# Patient Record
Sex: Female | Born: 1950
Health system: Southern US, Community
[De-identification: ages and names within clinical notes are randomized; demographics above are authoritative.]

## PROBLEM LIST (undated history)

## (undated) DIAGNOSIS — E039 Hypothyroidism, unspecified: Secondary | ICD-10-CM

## (undated) DIAGNOSIS — E785 Hyperlipidemia, unspecified: Secondary | ICD-10-CM

## (undated) DIAGNOSIS — I10 Essential (primary) hypertension: Secondary | ICD-10-CM

## (undated) DIAGNOSIS — Z8619 Personal history of other infectious and parasitic diseases: Secondary | ICD-10-CM

## (undated) DIAGNOSIS — B029 Zoster without complications: Secondary | ICD-10-CM

## (undated) DIAGNOSIS — M533 Sacrococcygeal disorders, not elsewhere classified: Secondary | ICD-10-CM

## (undated) DIAGNOSIS — J189 Pneumonia, unspecified organism: Secondary | ICD-10-CM

## (undated) DIAGNOSIS — T7840XA Allergy, unspecified, initial encounter: Secondary | ICD-10-CM

## (undated) DIAGNOSIS — M1990 Unspecified osteoarthritis, unspecified site: Secondary | ICD-10-CM

## (undated) DIAGNOSIS — R55 Syncope and collapse: Secondary | ICD-10-CM

## (undated) DIAGNOSIS — K219 Gastro-esophageal reflux disease without esophagitis: Secondary | ICD-10-CM

## (undated) DIAGNOSIS — I351 Nonrheumatic aortic (valve) insufficiency: Secondary | ICD-10-CM

## (undated) DIAGNOSIS — E079 Disorder of thyroid, unspecified: Secondary | ICD-10-CM

## (undated) HISTORY — DX: Personal history of other infectious and parasitic diseases: Z86.19

## (undated) HISTORY — DX: Allergy, unspecified, initial encounter: T78.40XA

## (undated) HISTORY — DX: Sacrococcygeal disorders, not elsewhere classified: M53.3

## (undated) HISTORY — DX: Nonrheumatic aortic (valve) insufficiency: I35.1

## (undated) HISTORY — PX: WISDOM TOOTH EXTRACTION: SHX21

## (undated) HISTORY — DX: Hyperlipidemia, unspecified: E78.5

## (undated) HISTORY — PX: CHOLECYSTECTOMY: SHX55

## (undated) HISTORY — PX: TUBAL LIGATION: SHX77

## (undated) HISTORY — DX: Disorder of thyroid, unspecified: E07.9

## (undated) HISTORY — DX: Syncope and collapse: R55

## (undated) HISTORY — DX: Zoster without complications: B02.9

## (undated) HISTORY — PX: EXPLORATORY LAPAROTOMY: SUR591

## (undated) HISTORY — DX: Gastro-esophageal reflux disease without esophagitis: K21.9

## (undated) HISTORY — PX: KIDNEY STONE SURGERY: SHX686

## (undated) HISTORY — DX: Unspecified osteoarthritis, unspecified site: M19.90

## (undated) HISTORY — DX: Essential (primary) hypertension: I10

---

## 1998-08-11 ENCOUNTER — Other Ambulatory Visit: Admission: RE | Admit: 1998-08-11 | Discharge: 1998-08-11 | Payer: Self-pay | Admitting: Obstetrics and Gynecology

## 1999-08-28 ENCOUNTER — Other Ambulatory Visit: Admission: RE | Admit: 1999-08-28 | Discharge: 1999-08-28 | Payer: Self-pay | Admitting: Obstetrics and Gynecology

## 1999-08-31 ENCOUNTER — Encounter (INDEPENDENT_AMBULATORY_CARE_PROVIDER_SITE_OTHER): Payer: Self-pay | Admitting: Specialist

## 1999-08-31 ENCOUNTER — Other Ambulatory Visit: Admission: RE | Admit: 1999-08-31 | Discharge: 1999-08-31 | Payer: Self-pay | Admitting: Obstetrics and Gynecology

## 1999-09-24 ENCOUNTER — Encounter: Admission: RE | Admit: 1999-09-24 | Discharge: 1999-09-24 | Payer: Self-pay | Admitting: Family Medicine

## 1999-09-24 ENCOUNTER — Encounter: Payer: Self-pay | Admitting: Family Medicine

## 2000-09-15 ENCOUNTER — Other Ambulatory Visit: Admission: RE | Admit: 2000-09-15 | Discharge: 2000-09-15 | Payer: Self-pay | Admitting: Obstetrics and Gynecology

## 2000-09-27 ENCOUNTER — Encounter: Admission: RE | Admit: 2000-09-27 | Discharge: 2000-09-27 | Payer: Self-pay | Admitting: Family Medicine

## 2000-09-27 ENCOUNTER — Encounter: Payer: Self-pay | Admitting: Family Medicine

## 2001-09-29 ENCOUNTER — Encounter: Admission: RE | Admit: 2001-09-29 | Discharge: 2001-09-29 | Payer: Self-pay | Admitting: Family Medicine

## 2001-09-29 ENCOUNTER — Encounter: Payer: Self-pay | Admitting: Family Medicine

## 2001-10-23 ENCOUNTER — Other Ambulatory Visit: Admission: RE | Admit: 2001-10-23 | Discharge: 2001-10-23 | Payer: Self-pay | Admitting: Obstetrics and Gynecology

## 2002-04-17 ENCOUNTER — Encounter: Payer: Self-pay | Admitting: Obstetrics and Gynecology

## 2002-04-17 ENCOUNTER — Encounter: Admission: RE | Admit: 2002-04-17 | Discharge: 2002-04-17 | Payer: Self-pay | Admitting: Obstetrics and Gynecology

## 2002-11-28 ENCOUNTER — Other Ambulatory Visit: Admission: RE | Admit: 2002-11-28 | Discharge: 2002-11-28 | Payer: Self-pay | Admitting: Family Medicine

## 2003-08-06 ENCOUNTER — Encounter: Admission: RE | Admit: 2003-08-06 | Discharge: 2003-08-06 | Payer: Self-pay | Admitting: Family Medicine

## 2003-12-19 ENCOUNTER — Other Ambulatory Visit: Admission: RE | Admit: 2003-12-19 | Discharge: 2003-12-19 | Payer: Self-pay | Admitting: Family Medicine

## 2004-09-04 ENCOUNTER — Encounter: Admission: RE | Admit: 2004-09-04 | Discharge: 2004-09-04 | Payer: Self-pay | Admitting: Family Medicine

## 2005-09-16 ENCOUNTER — Encounter: Admission: RE | Admit: 2005-09-16 | Discharge: 2005-09-16 | Payer: Self-pay | Admitting: Family Medicine

## 2005-09-20 ENCOUNTER — Other Ambulatory Visit: Admission: RE | Admit: 2005-09-20 | Discharge: 2005-09-20 | Payer: Self-pay | Admitting: Family Medicine

## 2005-12-10 ENCOUNTER — Encounter: Admission: RE | Admit: 2005-12-10 | Discharge: 2005-12-10 | Payer: Self-pay | Admitting: Family Medicine

## 2006-05-31 ENCOUNTER — Encounter: Admission: RE | Admit: 2006-05-31 | Discharge: 2006-05-31 | Payer: Self-pay | Admitting: Allergy and Immunology

## 2006-09-27 ENCOUNTER — Encounter: Admission: RE | Admit: 2006-09-27 | Discharge: 2006-09-27 | Payer: Self-pay | Admitting: Family Medicine

## 2007-01-05 LAB — CONVERTED CEMR LAB: Pap Smear: NORMAL

## 2007-01-21 ENCOUNTER — Encounter: Payer: Self-pay | Admitting: Internal Medicine

## 2007-01-23 ENCOUNTER — Other Ambulatory Visit: Admission: RE | Admit: 2007-01-23 | Discharge: 2007-01-23 | Payer: Self-pay | Admitting: Family Medicine

## 2007-06-12 ENCOUNTER — Emergency Department (HOSPITAL_COMMUNITY): Admission: EM | Admit: 2007-06-12 | Discharge: 2007-06-12 | Payer: Self-pay | Admitting: Emergency Medicine

## 2007-10-06 ENCOUNTER — Ambulatory Visit (HOSPITAL_COMMUNITY): Admission: RE | Admit: 2007-10-06 | Discharge: 2007-10-07 | Payer: Self-pay | Admitting: General Surgery

## 2007-10-06 ENCOUNTER — Encounter (INDEPENDENT_AMBULATORY_CARE_PROVIDER_SITE_OTHER): Payer: Self-pay | Admitting: General Surgery

## 2007-10-30 ENCOUNTER — Encounter: Admission: RE | Admit: 2007-10-30 | Discharge: 2007-10-30 | Payer: Self-pay | Admitting: Family Medicine

## 2008-01-02 ENCOUNTER — Ambulatory Visit: Payer: Self-pay | Admitting: Sports Medicine

## 2008-01-02 DIAGNOSIS — M79609 Pain in unspecified limb: Secondary | ICD-10-CM | POA: Insufficient documentation

## 2008-01-02 DIAGNOSIS — M722 Plantar fascial fibromatosis: Secondary | ICD-10-CM | POA: Insufficient documentation

## 2008-01-08 ENCOUNTER — Encounter: Payer: Self-pay | Admitting: Sports Medicine

## 2008-01-22 ENCOUNTER — Ambulatory Visit: Payer: Self-pay | Admitting: Internal Medicine

## 2008-01-22 DIAGNOSIS — E039 Hypothyroidism, unspecified: Secondary | ICD-10-CM | POA: Insufficient documentation

## 2008-01-22 DIAGNOSIS — N644 Mastodynia: Secondary | ICD-10-CM

## 2008-01-22 DIAGNOSIS — K219 Gastro-esophageal reflux disease without esophagitis: Secondary | ICD-10-CM | POA: Insufficient documentation

## 2008-01-22 LAB — CONVERTED CEMR LAB
Ketones, urine, test strip: NEGATIVE
Nitrite: NEGATIVE
Protein, U semiquant: NEGATIVE
Urobilinogen, UA: 0.2
WBC Urine, dipstick: NEGATIVE
pH: 5.5

## 2008-01-29 ENCOUNTER — Encounter: Payer: Self-pay | Admitting: Internal Medicine

## 2008-01-29 LAB — CONVERTED CEMR LAB
ALT: 20 units/L (ref 0–35)
AST: 19 units/L (ref 0–37)
Albumin: 3.9 g/dL (ref 3.5–5.2)
BUN: 14 mg/dL (ref 6–23)
Basophils Absolute: 0 10*3/uL (ref 0.0–0.1)
Bilirubin, Direct: 0.1 mg/dL (ref 0.0–0.3)
CO2: 30 meq/L (ref 19–32)
Eosinophils Relative: 6.7 % — ABNORMAL HIGH (ref 0.0–5.0)
GFR calc non Af Amer: 79 mL/min
LDL Cholesterol: 126 mg/dL — ABNORMAL HIGH (ref 0–99)
Platelets: 263 10*3/uL (ref 150–400)
Potassium: 5.4 meq/L — ABNORMAL HIGH (ref 3.5–5.1)
RBC: 4.66 M/uL (ref 3.87–5.11)
TSH: 1.56 microintl units/mL (ref 0.35–5.50)
Total Bilirubin: 0.8 mg/dL (ref 0.3–1.2)
Triglycerides: 44 mg/dL (ref 0–149)
VLDL: 9 mg/dL (ref 0–40)

## 2008-02-26 ENCOUNTER — Telehealth: Payer: Self-pay | Admitting: *Deleted

## 2008-02-29 ENCOUNTER — Telehealth: Payer: Self-pay | Admitting: Internal Medicine

## 2008-02-29 ENCOUNTER — Ambulatory Visit: Payer: Self-pay | Admitting: Internal Medicine

## 2008-02-29 DIAGNOSIS — J029 Acute pharyngitis, unspecified: Secondary | ICD-10-CM | POA: Insufficient documentation

## 2008-02-29 DIAGNOSIS — R05 Cough: Secondary | ICD-10-CM | POA: Insufficient documentation

## 2008-02-29 LAB — CONVERTED CEMR LAB: Rapid Strep: NEGATIVE

## 2008-03-19 ENCOUNTER — Telehealth: Payer: Self-pay | Admitting: *Deleted

## 2008-04-08 ENCOUNTER — Telehealth: Payer: Self-pay | Admitting: *Deleted

## 2008-06-13 ENCOUNTER — Telehealth: Payer: Self-pay | Admitting: *Deleted

## 2008-06-17 ENCOUNTER — Telehealth: Payer: Self-pay | Admitting: Internal Medicine

## 2008-06-24 ENCOUNTER — Telehealth (INDEPENDENT_AMBULATORY_CARE_PROVIDER_SITE_OTHER): Payer: Self-pay | Admitting: *Deleted

## 2008-10-10 ENCOUNTER — Telehealth (INDEPENDENT_AMBULATORY_CARE_PROVIDER_SITE_OTHER): Payer: Self-pay | Admitting: *Deleted

## 2008-10-14 ENCOUNTER — Encounter: Payer: Self-pay | Admitting: Internal Medicine

## 2008-11-18 ENCOUNTER — Ambulatory Visit: Payer: Self-pay | Admitting: Internal Medicine

## 2008-12-11 ENCOUNTER — Encounter: Admission: RE | Admit: 2008-12-11 | Discharge: 2008-12-11 | Payer: Self-pay | Admitting: Internal Medicine

## 2009-01-04 HISTORY — PX: COLONOSCOPY: SHX174

## 2009-02-17 ENCOUNTER — Encounter: Payer: Self-pay | Admitting: Internal Medicine

## 2009-02-19 ENCOUNTER — Ambulatory Visit: Payer: Self-pay | Admitting: Internal Medicine

## 2009-02-19 LAB — CONVERTED CEMR LAB
ALT: 21 units/L (ref 0–35)
BUN: 11 mg/dL (ref 6–23)
Basophils Relative: 1.3 % (ref 0.0–3.0)
Bilirubin Urine: NEGATIVE
Bilirubin, Direct: 0.2 mg/dL (ref 0.0–0.3)
Calcium: 9.5 mg/dL (ref 8.4–10.5)
Chloride: 106 meq/L (ref 96–112)
Creatinine, Ser: 0.8 mg/dL (ref 0.4–1.2)
Eosinophils Relative: 4.1 % (ref 0.0–5.0)
Glucose, Bld: 94 mg/dL (ref 70–99)
HCT: 41.9 % (ref 36.0–46.0)
HDL: 54.2 mg/dL (ref >39.00)
Hemoglobin, Urine: NEGATIVE
Leukocytes, UA: NEGATIVE
MCV: 90.7 fL (ref 78.0–100.0)
Monocytes Absolute: 0.3 10*3/uL (ref 0.1–1.0)
Neutro Abs: 1.6 10*3/uL (ref 1.4–7.7)
Neutrophils Relative %: 44.9 % (ref 43.0–77.0)
Nitrite: NEGATIVE
Platelets: 287 10*3/uL (ref 150.0–400.0)
Potassium: 4 meq/L (ref 3.5–5.1)
RDW: 11.9 % (ref 11.5–14.6)
TSH: 2.16 microintl units/mL (ref 0.35–5.50)
Total CHOL/HDL Ratio: 4
Total Protein, Urine: NEGATIVE mg/dL
VLDL: 16.2 mg/dL (ref 0.0–40.0)
pH: 5.5 (ref 5.0–8.0)

## 2009-02-23 ENCOUNTER — Encounter: Payer: Self-pay | Admitting: Internal Medicine

## 2009-02-26 ENCOUNTER — Ambulatory Visit: Payer: Self-pay | Admitting: Internal Medicine

## 2009-02-26 ENCOUNTER — Other Ambulatory Visit
Admission: RE | Admit: 2009-02-26 | Discharge: 2009-02-26 | Payer: Self-pay | Source: Home / Self Care | Admitting: Internal Medicine

## 2009-02-26 DIAGNOSIS — J069 Acute upper respiratory infection, unspecified: Secondary | ICD-10-CM | POA: Insufficient documentation

## 2009-02-26 DIAGNOSIS — N770 Ulceration of vulva in diseases classified elsewhere: Secondary | ICD-10-CM | POA: Insufficient documentation

## 2009-02-27 ENCOUNTER — Telehealth: Payer: Self-pay | Admitting: *Deleted

## 2009-02-28 ENCOUNTER — Ambulatory Visit: Payer: Self-pay | Admitting: Internal Medicine

## 2009-03-01 ENCOUNTER — Encounter: Payer: Self-pay | Admitting: Internal Medicine

## 2009-03-03 LAB — CONVERTED CEMR LAB: Pap Smear: NEGATIVE

## 2009-03-14 ENCOUNTER — Telehealth: Payer: Self-pay | Admitting: *Deleted

## 2009-03-28 ENCOUNTER — Ambulatory Visit: Payer: Self-pay | Admitting: Internal Medicine

## 2009-04-18 ENCOUNTER — Encounter (INDEPENDENT_AMBULATORY_CARE_PROVIDER_SITE_OTHER): Payer: Self-pay | Admitting: *Deleted

## 2009-06-23 ENCOUNTER — Encounter (INDEPENDENT_AMBULATORY_CARE_PROVIDER_SITE_OTHER): Payer: Self-pay | Admitting: *Deleted

## 2009-06-26 ENCOUNTER — Ambulatory Visit: Payer: Self-pay | Admitting: Gastroenterology

## 2009-07-11 ENCOUNTER — Ambulatory Visit: Payer: Self-pay | Admitting: Gastroenterology

## 2009-07-16 ENCOUNTER — Telehealth (INDEPENDENT_AMBULATORY_CARE_PROVIDER_SITE_OTHER): Payer: Self-pay | Admitting: *Deleted

## 2009-07-17 ENCOUNTER — Encounter: Payer: Self-pay | Admitting: Gastroenterology

## 2009-07-31 ENCOUNTER — Ambulatory Visit: Payer: Self-pay | Admitting: Internal Medicine

## 2009-08-01 ENCOUNTER — Telehealth: Payer: Self-pay | Admitting: *Deleted

## 2009-08-29 ENCOUNTER — Ambulatory Visit: Payer: Self-pay | Admitting: Internal Medicine

## 2009-12-15 ENCOUNTER — Encounter
Admission: RE | Admit: 2009-12-15 | Discharge: 2009-12-15 | Payer: Self-pay | Source: Home / Self Care | Attending: Internal Medicine | Admitting: Internal Medicine

## 2009-12-15 LAB — HM MAMMOGRAPHY

## 2009-12-24 ENCOUNTER — Telehealth: Payer: Self-pay | Admitting: *Deleted

## 2010-02-03 NOTE — Letter (Signed)
Summary: [facility] Instructions  [facility]  [address] [place], [place] [address]   Phone: [phone]  Fax: [phone]       Jillian Escobar    02/11/1950    MRN: [id number]        Procedure Day /Date:  07/11/09       Arrival Time:_ 3:00pm     Procedure Time:  4:00pm     Location of Procedure:                    _ x_  [facility] (4th Floor)                        PREPARATION FOR COLONOSCOPY WITH MOVIPREP   Starting 5 days prior to your procedure _7/3/11 _ do not eat nuts, seeds, popcorn, corn, beans, peas,  salads, or any raw vegetables.  Do not take any fiber supplements (e.g. Metamucil, Citrucel, and Benefiber).  THE DAY BEFORE YOUR PROCEDURE         DATE:  07/10/09  DAY:  Thursday  1.  Drink clear liquids the entire day-NO SOLID FOOD  2.  Do not drink anything colored red or purple.  Avoid juices with pulp.  No orange juice.  3.  Drink at least 64 oz. (8 glasses) of fluid/clear liquids during the day to prevent dehydration and help the prep work efficiently.  CLEAR LIQUIDS INCLUDE: Water Jello Ice Popsicles Tea (sugar ok, no milk/cream) Powdered fruit flavored drinks Coffee (sugar ok, no milk/cream) Gatorade Juice: apple, white grape, white cranberry  Lemonade Clear bullion, consomm, broth Carbonated beverages (any kind) Strained chicken noodle soup Hard Candy                             4.  In the morning, mix first dose of MoviPrep solution:    Empty 1 Pouch A and 1 Pouch B into the disposable container    Add lukewarm drinking water to the top line of the container. Mix to dissolve    Refrigerate (mixed solution should be used within 24 hrs)  5.  Begin drinking the prep at 5:00 p.m. The MoviPrep container is divided by 4 marks.   Every 15 minutes drink the solution down to the next mark (approximately 8 oz) until the full liter is complete.   6.  Follow completed prep with 16 oz of clear liquid of your choice (Nothing  red or purple).  Continue to drink clear liquids until bedtime.  7.  Before going to bed, mix second dose of MoviPrep solution:    Empty 1 Pouch A and 1 Pouch B into the disposable container    Add lukewarm drinking water to the top line of the container. Mix to dissolve    Refrigerate  THE DAY OF YOUR PROCEDURE      DATE:   07/11/09 DAY:   Friday  Beginning at 11:00 a.m. (5 hours before procedure):         1. Every 15 minutes, drink the solution down to the next mark (approx 8 oz) until the full liter is complete.  2. Follow completed prep with 16 oz. of clear liquid of your choice.    3. You may drink clear liquids until 2pm   (2 HOURS BEFORE PROCEDURE).   MEDICATION INSTRUCTIONS  Unless otherwise instructed, you should take regular prescription medications with a small sip of water  as early as possible the morning of your procedure.         OTHER INSTRUCTIONS  You will need a responsible adult at least 58 years of age to accompany you and drive you home.   This person must remain in the waiting room during your procedure.  Wear loose fitting clothing that is easily removed.  Leave jewelry and other valuables at home.  However, you may wish to bring a book to read or  an iPod/MP3 player to listen to music as you wait for your procedure to start.  Remove all body piercing jewelry and leave at home.  Total time from sign-in until discharge is approximately 2-3 hours.  You should go home directly after your procedure and rest.  You can resume normal activities the  day after your procedure.  The day of your procedure you should not:   Drive   Make legal decisions   Operate machinery   Drink alcohol   Return to work  You will receive specific instructions about eating, activities and medications before you leave.    The above instructions have been reviewed and explained to me by   Clide Cliff, RN______________________    I fully understand and  can verbalize these instructions _____________________________ Date _________

## 2010-02-03 NOTE — Progress Notes (Signed)
Summary: vaginal sore  Phone Note Call from Patient   Caller: Patient Call For: Madelin Headings MD Summary of Call: Pt. called today to ask if Dr. Fabian Sharp could see her today.  She has developed a new vaginal ulcer, and wants it checked today. [phone] Initial call taken by: Lynann Beaver CMA,  February 27, 2009 10:20 AM  Follow-up for Phone Call        Spoke with pt sore inside vaginal area, quarter of inch. Pt is using cold compress. She states that it burns when she urinates. She is wanting to know what to do in the meantime. Follow-up by: Romualdo Bolk, CMA Duncan Dull),  February 27, 2009 10:59 AM  Additional Follow-up for Phone Call Additional follow up Details #1::        viscous xylocaine   if avialable  orgel   to apply q 3-4 hours as needed.   disc 60 cc  Additional Follow-up by: Madelin Headings MD,  February 27, 2009 12:01 PM    Additional Follow-up for Phone Call Additional follow up Details #2::    Pt aware and rx called in. Follow-up by: Romualdo Bolk, CMA (AAMA),  February 27, 2009 12:08 PM  New/Updated Medications: LIDOCAINE VISCOUS 2 % SOLN (LIDOCAINE HCL) apply q 3-4 hours as needed Prescriptions: LIDOCAINE VISCOUS 2 % SOLN (LIDOCAINE HCL) apply q 3-4 hours as needed  #60 x 0   Entered by:   Romualdo Bolk, CMA (AAMA)   Authorized by:   Madelin Headings MD   Signed by:   Romualdo Bolk, CMA (AAMA) on 02/27/2009   Method used:   Telephoned to ...       [facility] [address] (retail)       [address]       [place], [place]  [address]       Ph: [phone] or [phone]       Fax: [phone]   RxID:   [id number]

## 2010-02-03 NOTE — Letter (Signed)
Summary: Health Exam Form/[employer]  Health Exam Form/[employer]   Imported By: Sherian Rein 03/06/2009 10:55:40  _____________________________________________________________________  External Attachment:    Type:   Image     Comment:   External Document

## 2010-02-03 NOTE — Letter (Signed)
Summary: Previsit letter  [facility]  [address] [place], [place] [address]   Phone: [phone]  Fax: [phone]       04/18/2009 MRN: [id number]  [facility] [address] [place], [place]  [address]  Dear [name],  Welcome to the Gastroenterology Division at [facility].    You are scheduled to see a nurse for your pre-procedure visit on 06-26-09 at 10:00a.m. on the 3rd floor at [facility], [address]  We ask that you try to arrive at our office 15 minutes prior to your appointment time to allow for check-in.  Your nurse visit will consist of discussing your medical and surgical history, your immediate family medical history, and your medications.    Please bring a complete list of all your medications or, if you prefer, bring the medication bottles and we will list them.  We will need to be aware of both prescribed and over the counter drugs.  We will need to know exact dosage information as well.  If you are on blood thinners (Coumadin, Plavix, Aggrenox, Ticlid, etc.) please call our office today/prior to your appointment, as we need to consult with your physician about holding your medication.   Please be prepared to read and sign documents such as consent forms, a financial agreement, and acknowledgement forms.  If necessary, and with your consent, a friend or relative is welcome to sit-in on the nurse visit with you.  Please bring your insurance card so that we may make a copy of it.  If your insurance requires a referral to see a specialist, please bring your referral form from your primary care physician.  No co-pay is required for this nurse visit.     If you cannot keep your appointment, please call [phone] to cancel or reschedule prior to your appointment date.  This allows [place] the opportunity to schedule an appointment for another patient in need of care.    Thank you for choosing  Gastroenterology for your medical needs.   We appreciate the opportunity to care for you.  Please visit [place] at our website  to learn more about our practice.                     Sincerely.                                                                                                                   The Gastroenterology Division

## 2010-02-03 NOTE — Progress Notes (Signed)
Summary: Follow up appt  Phone Note Outgoing Call   Summary of Call:  Pt needs follow up appt 2 week follow up per Dr. Laverda Sorenson.  Tried to call pt to sch.  No answer home number. Initial call taken by: Ashok Cordia RN,  July 16, 2009 10:29 AM  Follow-up for Phone Call        No answer home phone.  Lupita Leash Porchia Sinkler RN  July 18, 2009 1:05 PM  Unable to reach pt by phone.  Letter mailed. Follow-up by: Ashok Cordia RN,  July 18, 2009 3:09 PM

## 2010-02-03 NOTE — Assessment & Plan Note (Signed)
Summary: cpx/ssc   Vital Signs:  Patient profile:   60 year old female Menstrual status:  postmenopausal Height:      63.5 inches Weight:      166 pounds Pulse rate:   78 / minute BP sitting:   120 / 80  (right arm) Cuff size:   regular  Vitals Entered By: Romualdo Bolk, CMA (AAMA) (February 26, 2009 11:49 AM) CC: CPX Discuss doing a pap- Needs PPD, Tdap, Hep A and Hep B done for [place]- Pt was seen at [facility] for coughing and ha. They gave her tessalone pearls and dx with flu. Pt also has uclers in vaginal area and it is raw inside.   History of Present Illness: Jillian Escobar  comes in   for preventive visit  check up   and form to be filled out  and immunizations but has a number of medical issues . She has "  resolving flu "and has seen in urgent care.    Fever 2-3 days ago a? continuing.    still has ha and  cough . otc meds.  No voimiting diarrhea  some nausea.  rapid strep is negative but still hurts.   no NVD   Ulcers in vagina .   never had these.    before   Hs burning on urination for  x 1 day.   No recent SA or exposures  and ? if has had cold sores in the remote past but none  recenlty and no mouth ulcers.  NO dc and no uti signs otherwise  No family hx of same   no adenopathy or othe rashes   HRT every other day .  No flushes and . Thyroid  no new signs  HRT : doing well and if takes every other day no hot flushes .  GERD  stable   Preventive Care Screening  Prior Values:    Pap Smear:  Normal (01/05/2007)    Mammogram:  ASSESSMENT: Negative - BI-RADS 1^MM DIGITAL SCREENING (12/11/2008)   Preventive Screening-Counseling & Management  Alcohol-Tobacco     Alcohol drinks/day: 1     Alcohol type: Beer     Smoking Status: quit     Year Quit: 25 years ago  Caffeine-Diet-Exercise     Caffeine use/day: 1     Does Patient Exercise: no  Hep-HIV-STD-Contraception     Dental Visit-last 6 months yes     Sun Exposure-Excessive: no  Safety-Violence-Falls     Seat Belt Use: yes     Firearms in the Home: no firearms in the home     Smoke Detectors: yes      Blood Transfusions:  no.        Travel History:  [place].    Current Medications (verified): 1)  Levoxyl 50 Mcg Tabs (Levothyroxine Sodium) .[name].. 1 By Mouth Once Daily 2)  Nexium 40 Mg Cpdr (Esomeprazole Magnesium) .... Take 1 Tablet By Mouth Once A Day  File Medication Under Federal Plan [id number] 3)  Prempro 0.625-2.5 Mg Tabs (Conj Estrog-Medroxyprogest Ace) .[name].. 1 By Mouth Once Daily  Allergies (verified): 1)  ! * Eggs  Past History:  Past medical, surgical, family and social histories (including risk factors) reviewed, and no changes noted (except as noted below).  Past Medical History: Reviewed history from 02/29/2008 and no changes required. Chicken  Pox  GERD G1P2        LAST Mammogram:10/2007 Pap:2009 Td: 2000 Colonscopy: no   EKG: 06/2007  Dexa: no  Eye Exam: 2009 Smoking: Former  Consults: Dr. Jenna Luo  Dr. [facility]  Past Surgical History: Reviewed history from 01/22/2008 and no changes required. Cholecystectomy Laparotomy-exploratory Tubal ligation  G2 P1  Family History: Reviewed history from 02/29/2008 and no changes required. none Father: COPD, Dementia, Galucoma, Heart Disease CABG 49s  Mother: Heart Disease  stents  60's  Siblings: Brother and Sister-Colitis  Ulcerative     Social History: Reviewed history from 02/29/2008 and no changes required. nonsmoker originally from [place]  moved 60 years of age. 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  The patient denies anorexia, weight loss, weight gain, vision loss, decreased hearing, hoarseness, chest pain, syncope,  dyspnea on exertion, peripheral edema, headaches, hemoptysis, abdominal pain, melena, hematochezia, severe indigestion/heartburn, hematuria, incontinence, muscle weakness, suspicious skin lesions, difficulty walking, depression, unusual weight change, abnormal bleeding, enlarged lymph nodes, angioedema, and breast masses.         yesterday had fever  Physical Exam General Appearance: well developed, well nourished, no acute distress congested   non toxic  Eyes: conjunctiva and lids normal, PERRLA, EOMI,  WNL Ears, Nose, Mouth, Throat: TM clear, nares congested no face pain, oral exam WNL no oral ulcers  Neck: supple, no lymphadenopathy, no thyromegaly, no JVD Respiratory: clear to auscultation and percussion, respiratory effort normal Cardiovascular: regular rate and rhythm, S1-S2, no murmur, rub or gallop, no bruits, peripheral pulses normal and symmetric, no cyanosis, clubbing, edema or varicosities Chest: no scars, masses, tenderness; no asymmetry, skin changes, nipple discharge   Gastrointestinal: soft, non-tender; no hepatosplenomegaly, masses; active bowel sounds all quadrants, guaiac negative stool; no masses, tenderness, hemorrhoids  Genitourinary: no vaginal discharge, ; no masses or tenderness cx clear     external  2 --3-5 mm  yellow based superficial ulcers superior perineum near urethra and ? healed red area at posterior fourchette. No vesicles and no adenopathy.  no internal ulcers  nl mucosa there.    Lymphatic: no cervical, axillary or inguinal adenopathy Musculoskeletal: gait normal, muscle tone and strength WNL, no joint swelling, effusions, discoloration, crepitus  Skin: clear, good turgor, color WNL, no rashes, lesions, or ulcerations Neurologic: normal mental status, normal reflexes, normal strength, sensation, and motion Psychiatric: alert; oriented to person, place and time Other Exam:  see labs        Impression & Recommendations:  Problem # 1:  PREVENTIVE HEALTH CARE  (ICD-V70.0)  Orders: Gastroenterology Referral (GI)  Problem # 2:  ROUTINE GYNECOLOGICAL EXAM (ICD-V72.31)  Orders: Pap Smear, Thin Prep ( Collection of) (Z6109)  Problem # 3:  ULCERATION OF VULVA DISEASE CLASSIFIED ELSEWHERE (ICD-616.51) not typical of herpes and internal cervix and vag exam looks normal   and has no adenopathy ? if related to viral rti  but no oral ulcers . denies  exposure.   Unfortunatley there were no culture  transports in the clininc today on back order  so will watch and recheck if worse . Poss healing  irritated .   No hx of diagnosed  autoimmuniedisease except the thyroid .    No fam hx .     Problem # 4:  URI (ICD-465.9) dx elsewhere as "flu"  unsure if related to above   Problem # 5:  HORMONE REPLACEMENT THERAPY (ICD-V07.4) disc weaning  to 3 x per week and then less.   Problem # 6:  UNSPECIFIED HYPOTHYROIDISM (ICD-244.9)  Her updated medication list for this problem includes:    Levoxyl 50 Mcg Tabs (Levothyroxine sodium) .[name]... 1 by mouth once daily  Labs Reviewed: TSH: 2.16 (02/19/2009)    Chol: 201 (02/19/2009)   HDL: 54.20 (02/19/2009)   LDL: 126 (01/22/2008)   TG: 81.0 (02/19/2009)  Complete Medication List: 1)  Levoxyl 50 Mcg Tabs (Levothyroxine sodium) .[name].. 1 by mouth once daily 2)  Nexium 40 Mg Cpdr (Esomeprazole magnesium) .... Take 1 tablet by mouth once a day  file medication under federal plan [id number] 3)  Prempro 0.625-2.5 Mg Tabs (Conj estrog-medroxyprogest ace) .[name].. 1 by mouth once daily 4)  Lidocaine Viscous 2 % Soln (Lidocaine hcl) .... Apply q 3-4 hours as needed  Other Orders: TB Skin Test [phone]) Admin 1st Vaccine (69629)  Patient Instructions: 1)  will do   colon referal for late June  2)  return for ppd reading and immunization  in 2 days.  3)  Cool compresses to vaginal area  4)  If not better on friday will recheck this area.    Immunizations Administered:  PPD Skin Test:    Vaccine Type: PPD    Site: right  forearm    Mfr: Sanofi Pasteur    Dose: 0.1 ml    Route: ID    Given by: Romualdo Bolk, CMA (AAMA)    Exp. Date: 06/01/2011    Lot #: [id number]

## 2010-02-03 NOTE — Procedures (Signed)
Summary: Colonoscopy  Patient: Jillian Escobar Note: All result statuses are Final unless otherwise noted.  Tests: (1) Colonoscopy (COL)   COL Colonoscopy           DONE (C)     [facility]     [address]     [place], [place]  [address]           COLONOSCOPY PROCEDURE REPORT           PATIENT:  Jillian, Escobar  MR#:  [id number]     BIRTHDATE:  September 10, 1950, 58 yrs. old  GENDER:  female     ENDOSCOPIST:  Vania Rea. Jarold Motto, MD, [facility]     REF. BY:  Neta Mends. Panosh, M.D.     PROCEDURE DATE:  07/11/2009     PROCEDURE:  Colonoscopy with biopsy     ASA CLASS:  Class II     INDICATIONS:  Colorectal cancer screening, average risk, family     history of colon cancer, unexplained diarrhea     MEDICATIONS:   Fentanyl 75 mcg IV, Versed 10 mg IV           DESCRIPTION OF PROCEDURE:   After the risks benefits and     alternatives of the procedure were thoroughly explained, informed     consent was obtained.  Digital rectal exam was performed and     revealed no abnormalities.   The LB [id number] endoscope     was introduced through the anus and advanced to the cecum, which     was identified by both the appendix and ileocecal valve, without     limitations.  The quality of the prep was excellent, using     MoviPrep.  The instrument was then slowly withdrawn as the colon     was fully examined.     <<PROCEDUREIMAGES>>           FINDINGS:  Scattered diverticula were found in the sigmoid to     descending colon segments.  No polyps or cancers were seen.  This     was otherwise a normal examination of the colon. RANDOM BIOPSIES     DONE.   Retroflexed views in the rectum revealed no abnormalities.     The scope was then withdrawn from the patient and the procedure     completed.           COMPLICATIONS:  None     ENDOSCOPIC IMPRESSION:     1) Diverticula, scattered in the sigmoid to descending colon     segments     2) No polyps or cancers     3) Otherwise normal  examination     R/O MICROSCOPIC/COLLAGENOUS COLITIS.     RECOMMENDATIONS:     1) Given your significant family history of colon cancer, you     should have a repeat colonoscopy in 5 years     2) High fiber diet.     3) Out patient follow-up in 2 weeks.     4) Await biopsy results     FLORSTAR PROBIOTIC BID FOR 2 WEEKS.HX. OF CLINDAMYCIN EXPOSURE.           REPEAT EXAM:  No           ______________________________     Vania Rea. Jarold Motto, MD, [facility]           CC:           n.     REVISED:  07/14/2009  09:28 AM     eSIGNED:   Vania Rea. Patterson at 07/14/2009 09:28 AM           Beverley Fiedler, [id number]  Note: An exclamation mark (!) indicates a result that was not dispersed into the flowsheet. Document Creation Date: 07/14/2009 9:28 AM _______________________________________________________________________  (1) Order result status: Final Collection or observation date-time: 07/11/2009 16:55 Requested date-time:  Receipt date-time:  Reported date-time:  Referring Physician:   Ordering Physician: Sheryn Bison [phone]) Specimen Source:  Source: Launa Grill Order Number: [id number] Lab site:   Appended Document: Colonoscopy     Procedures Next Due Date:    Colonoscopy: 07/2014

## 2010-02-03 NOTE — Assessment & Plan Note (Signed)
Summary: 3RD HEP B//CCM----PT [facility] // RS/pt rescd//ccm  Nurse Visit   Allergies: 1)  ! * Eggs  Immunizations Administered:  Hepatitis B Vaccine # 3:    Vaccine Type: HepB NB-73yrs    Site: right deltoid    Mfr: Merck    Dose: 1.0 ml    Route: IM    Given by: Romualdo Bolk, CMA (AAMA)    Exp. Date: 05/04/2011    Lot #: [id number]  Orders Added: 1)  Hepatitis B Vaccine NB-22yrs [14782] 2)  Admin 1st Vaccine [95621]

## 2010-02-03 NOTE — Assessment & Plan Note (Signed)
Summary: INJ//ALP  Nurse Visit   Allergies: 1)  ! * Eggs  Immunizations Administered:  Hepatitis A Vaccine # 2:    Vaccine Type: HepA    Site: right deltoid    Mfr: GlaxoSmithKline    Dose: 1.0 ml    Route: IM    Given by: Romualdo Bolk, CMA (AAMA)    Exp. Date: 04/23/2011    Lot #: [id number]  Orders Added: 1)  Hepatitis A Vaccine (Adult Dose) [90632] 2)  Admin 1st Vaccine [09811]

## 2010-02-03 NOTE — Progress Notes (Signed)
Summary: Pt called re: hep b inj. Is she due another inj?  Phone Note Call from Patient Call back at [facility] Phone [phone]   Caller: Patient Summary of Call: Pt came in for hep b inj, but she thought it was the 3rd inj. Pt got her first inj in March 2011, and hasnt had another one except for the one she rcvd on 07/31/09, so she is thinking that she needs one more inj.  Pls advise.  Initial call taken by: Lucy Antigua,  August 01, 2009 11:38 AM  Follow-up for Phone Call        Pt was given Hep B instead of Twinrx. Pt is due to Hep A on or after 08/28/09. Follow-up by: Romualdo Bolk, CMA Duncan Dull),  August 01, 2009 11:52 AM  Additional Follow-up for Phone Call Additional follow up Details #1::        Pt aware of this and appt is schedule. Additional Follow-up by: Romualdo Bolk, CMA (AAMA),  August 01, 2009 3:02 PM

## 2010-02-03 NOTE — Assessment & Plan Note (Signed)
Summary: follow up/immuizations/ssc   Vital Signs:  Patient profile:   61 year old female Menstrual status:  postmenopausal Weight:      160 pounds Pulse rate:   75 / minute BP sitting:   110 / 60  (right arm) Cuff size:   regular  Vitals Entered By: Romualdo Bolk, CMA (AAMA) (February 28, 2009 10:49 AM) CC: Pt now has a blister inside her vaginal area.   History of Present Illness: Jillian Escobar comesin today for  follow up of vaginal perineal ulcers sores. Since last visit  her fever better cough looser . NO new rashes or other signs   No swollen   glands   New one yesterday.    ?   using topical with mild help.    No sa for a couple years and although gets cold sores none recently. DEnies risk for sti  even remote past.    Preventive Screening-Counseling & Management  Alcohol-Tobacco     Alcohol drinks/day: 1     Alcohol type: Beer     Smoking Status: quit     Year Quit: 25 years ago  Caffeine-Diet-Exercise     Caffeine use/day: 1     Does Patient Exercise: no  Current Medications (verified): 1)  Levoxyl 50 Mcg Tabs (Levothyroxine Sodium) .[name].. 1 By Mouth Once Daily 2)  Nexium 40 Mg Cpdr (Esomeprazole Magnesium) .... Take 1 Tablet By Mouth Once A Day  File Medication Under Federal Plan [id number] 3)  Prempro 0.625-2.5 Mg Tabs (Conj Estrog-Medroxyprogest Ace) .[name].. 1 By Mouth Once Daily 4)  Lidocaine Viscous 2 % Soln (Lidocaine Hcl) .... Apply Q 3-4 Hours As Needed  Allergies (verified): 1)  ! * Eggs  Past History:  Past medical, surgical, family and social histories (including risk factors) reviewed for relevance to current acute and chronic problems.  Past Medical History: Reviewed history from 02/29/2008 and no changes required. Chicken  Pox  GERD G1P2        LAST Mammogram:10/2007 Pap:2009 Td: 2000 Colonscopy: no   EKG: 06/2007  Dexa: no Eye Exam: 2009 Smoking: Former  Consults: Dr. Jenna Luo  Dr. [facility]  Past Surgical  History: Reviewed history from 01/22/2008 and no changes required. Cholecystectomy Laparotomy-exploratory Tubal ligation  G2 P1  Past History:  Care Management: Orthopedics: [place]  Family History: Reviewed history from 02/29/2008 and no changes required. none Father: COPD, Dementia, Galucoma, Heart Disease CABG 87s  Mother: Heart Disease  stents  60's  Siblings: Brother and Sister-Colitis  Ulcerative     Social History: Reviewed history from 02/29/2008 and no changes required. nonsmoker originally from [place]  moved 60 years of age. 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    Physical Exam  General:  Well-developed,well-nourished,in no acute distress; alert,appropriate and cooperative throughout examination Neck:  No deformities, masses, or tenderness noted. Lungs:  Normal respiratory effort, chest expands symmetrically. Lungs are clear to auscultation, no crackles or wheezes. Heart:  Normal rate and regular rhythm. S1 and S2 normal without gallop, murmur, click, rub or other extra sounds. Abdomen:  Bowel sounds positive,abdomen soft and non-tender without masses, organomegaly or hernias noted. Genitalia:  no vaginal discharge and mucosa pink and moist.  same 3 shallow ulcers w yellow exudate  mily tender  no vesicle  and no adenopathy   cx still clear. Inguinal Nodes:  No significant adenopathy Psych:  Oriented X3, good eye contact, not anxious appearing, and not depressed appearing.     Impression & Recommendations:  Problem # 1:  ULCERATION OF VULVA DISEASE CLASSIFIED ELSEWHERE (ICD-616.51)  Orders: T- * Misc. Laboratory test [phone]) Prescription Created Electronically [phone]) does not appear worse  viral culture done today. disc getting further blood  tests such as hiv and rpr but pat declined for minimal risk hx.   will continue to treat symptomatically   and follow up as needed   can go ahea and epirically rx for hsv .  Problem # 2:  URI (ICD-465.9) Assessment: Improved ok to get immunizations and ppd is negative    Complete Medication List: 1)  Levoxyl 50 Mcg Tabs (Levothyroxine sodium) .[name].. 1 by mouth once daily 2)  Nexium 40 Mg Cpdr (Esomeprazole magnesium) .... Take 1 tablet by mouth once a day  file medication under federal plan [id number] 3)  Prempro 0.625-2.5 Mg Tabs (Conj estrog-medroxyprogest ace) .[name].. 1 by mouth once daily 4)  Lidocaine Viscous 2 % Soln (Lidocaine hcl) .... Apply q 3-4 hours as needed 5)  Valacyclovir Hcl 1 Gm Tabs (Valacyclovir hcl) .[name].. 1 by mouth  three times a day  Other Orders: Tdap => 56yrs IM ([address]) Menactra IM (21308) Admin 1st Vaccine (65784) Admin of Any Addtl Vaccine (69629) TwinRix 1ml ( Hep A&B Adult dose) (52841)  Patient Instructions: 1)  try the valtrex and will notify of results . 2)  call with changes or worsening.  Prescriptions: VALACYCLOVIR HCL 1 GM TABS (VALACYCLOVIR HCL) 1 by mouth  three times a day  #21 x 0   Entered and Authorized by:   Madelin Headings MD   Signed by:   Madelin Headings MD on 02/28/2009   Method used:   [facility]        [id number]       [address]       [place], [place]  [address]       Ph: [phone] or [phone]       Fax: [phone]   RxID:   [id number]    Immunizations Administered:  Tetanus Vaccine:    Vaccine Type: Tdap    Site: right deltoid    Mfr: GlaxoSmithKline    Dose: 0.5 ml    Route: IM    Given by: Romualdo Bolk, CMA (AAMA)    Exp. Date: 03/01/2011    Lot #: [id number]  Meningococcal Vaccine:    Vaccine Type: Menactra    Site: right deltoid    Mfr: Sanofi Pasteur    Dose: 0.5 ml    Route: IM    Given by: Romualdo Bolk, CMA (AAMA)    Exp. Date: 09/22/2009    Lot #:  [id number]  TwinRix # 1:    Vaccine Type: TwinRix    Site: left deltoid    Mfr: GlaxoSmithKline    Dose: 1.0 ml    Route: IM    Given by: Romualdo Bolk, CMA (AAMA)    Exp. Date: 10/19/2010    Lot #: [id number]

## 2010-02-03 NOTE — Miscellaneous (Signed)
Summary: screening colon age--ch.  Clinical Lists Changes  Medications: Added new medication of MOVIPREP 100 GM  SOLR (PEG-KCL-NACL-NASULF-NA ASC-C) As directed - Signed Rx of MOVIPREP 100 GM  SOLR (PEG-KCL-NACL-NASULF-NA ASC-C) As directed;  #1 x 0;  Signed;  Entered by: Clide Cliff RN;  Authorized by: Mardella Layman MD [facility];  Method used: [facility] [phone]*, [address] [place], [place]  [address], Ph: [phone] or [phone], Fax: [phone] Observations: Added new observation of ALLERGY REV: Done (06/26/2009 9:52)    Prescriptions: MOVIPREP 100 GM  SOLR (PEG-KCL-NACL-NASULF-NA ASC-C) As directed  #1 x 0   Entered by:   Clide Cliff RN   Authorized by:   Mardella Layman MD [facility]   Signed by:   Clide Cliff RN on 06/26/2009   Method used:   Electronically to        [facility] [phone][facility] (retail)       [address]       [place], [place]  [address]       Ph: [phone] or [phone]       Fax: [phone]   RxID:   [id number]

## 2010-02-03 NOTE — Letter (Signed)
Summary: [facility] Summary  [facility] Summary   Imported By: Maryln Gottron 02/28/2009 13:39:47  _____________________________________________________________________  External Attachment:    Type:   Image     Comment:   External Document

## 2010-02-03 NOTE — Letter (Signed)
Summary: Patient Notice- Colon Biospy Results  [facility]  [address] [place], [place] [address]   Phone: [phone]  Fax: [phone]        July 17, 2009 MRN: [id number]    [facility] [address] [place], [place]  [address]    Dear Jillian Escobar,  I am pleased to inform you that the biopsies taken during your recent colonoscopy did not show any evidence of cancer upon pathologic examination.  Additional information/recommendations:  __No further action is needed at this time.  Please follow-up with      your primary care physician for your other healthcare needs.  __Please call [phone] to schedule a return visit to review      your condition.  _x_Continue with the treatment plan as outlined on the day of your      exam.  __You should have a repeat colonoscopy examination for this problem           in _ years.  Please call us if you are having persistent problems or have questions about your condition that have not been fully answered at this time.  Sincerely,  Mardella Layman MD [facility]   This letter has been electronically signed by your physician.  Appended Document: Patient Notice- Colon Biospy Results letter mailed

## 2010-02-03 NOTE — Assessment & Plan Note (Signed)
Summary: hep b inj/njr 3.30pm/njr  Nurse Visit   Allergies: 1)  ! * Eggs  Immunizations Administered:  TwinRix # 2:    Vaccine Type: TwinRix    Site: right deltoid    Mfr: GlaxoSmithKline    Dose: 1.0 ml    Route: IM    Given by: Romualdo Bolk, CMA (AAMA)    Exp. Date: 10/19/2010    Lot #: [id number]  Orders Added: 1)  TwinRix 1ml ( Hep A&B Adult dose) [90636] 2)  Admin 1st Vaccine [90471]

## 2010-02-03 NOTE — Progress Notes (Signed)
Summary: refills  Phone Note From Pharmacy   Caller: [facility] Reason for Call: Needs renewal Details for Reason: levoxyl Initial call taken by: Romualdo Bolk, CMA Duncan Dull),  March 14, 2009 3:53 PM  Follow-up for Phone Call        Rx sent to pharmacy. Follow-up by: Romualdo Bolk, CMA Duncan Dull),  March 14, 2009 3:53 PM    Prescriptions: LEVOXYL 50 MCG TABS (LEVOTHYROXINE SODIUM) 1 by mouth once daily  #90 x 3   Entered by:   Romualdo Bolk, CMA (AAMA)   Authorized by:   Madelin Headings MD   Signed by:   Romualdo Bolk, CMA (AAMA) on 03/14/2009   Method used:   Electronically to        [facility]* (mail-order)             ,          Ph: [phone]       Fax: [phone]   RxID:   [id number]

## 2010-02-03 NOTE — Consult Note (Signed)
Summary: [facility]   Imported By: Maryln Gottron 02/25/2009 15:04:50  _____________________________________________________________________  External Attachment:    Type:   Image     Comment:   External Document

## 2010-02-05 NOTE — Progress Notes (Signed)
Summary: sore throat  Phone Note Call from Patient   Caller: Patient Call For: Madelin Headings MD Summary of Call: [phone] After 5pm [phone] Chest congestion and sore throat with cough x 3 days.  Wants RX for cough meds and antibiotic.  No fever. Initial call taken by: Lynann Beaver CMA AAMA,  December 24, 2009 3:28 PM  Follow-up for Phone Call        antibiotics   would not help a viral resp infection   whisch could last 10 days or so  but can use hydrocodone syrup 1-2 tsp  every 4-6  hours    disp 6 oz  call with fever  alam features Follow-up by: Madelin Headings MD,  December 24, 2009 5:38 PM  Additional Follow-up for Phone Call Additional follow up Details #1::        Pt aware of this. Rx called in. Additional Follow-up by: Romualdo Bolk, CMA (AAMA),  December 24, 2009 5:40 PM    New/Updated Medications: HYDROCODONE-HOMATROPINE 5-1.5 MG/5ML SYRP (HYDROCODONE-HOMATROPINE) 1-2 tsp q 4-6 hours as needed for cough Prescriptions: HYDROCODONE-HOMATROPINE 5-1.5 MG/5ML SYRP (HYDROCODONE-HOMATROPINE) 1-2 tsp q 4-6 hours as needed for cough  #6oz x 0   Entered by:   Romualdo Bolk, CMA (AAMA)   Authorized by:   Madelin Headings MD   Signed by:   Romualdo Bolk, CMA (AAMA) on 12/24/2009   Method used:   Telephoned to ...       [facility] [address] (retail)       [address]       [place], [place]  [address]       Ph: [phone] or [phone]       Fax: [phone]   RxID:   [id number]

## 2010-04-07 ENCOUNTER — Telehealth: Payer: Self-pay | Admitting: Internal Medicine

## 2010-04-07 MED ORDER — LEVOTHYROXINE SODIUM 50 MCG PO TABS: 50.0000 ug | ORAL_TABLET | Freq: Every day | ORAL | Status: DC

## 2010-04-07 NOTE — Telephone Encounter (Signed)
Pt needs to schedule a cpx before next refill. Rx's sent to both pharmacies and letter sent to pt.

## 2010-04-07 NOTE — Telephone Encounter (Signed)
Pt called and said that [facility] sent a req for pts Levoxyl 50 mcg. Pt leaving to go out town early tues a.m. of next week and would like to be able to pick up med this week. Pt req [facility] [address] [facility].

## 2010-05-13 ENCOUNTER — Telehealth: Payer: Self-pay | Admitting: *Deleted

## 2010-05-13 NOTE — Telephone Encounter (Signed)
Pt needs a copy of 3rd Tdap injection sent to [facility].  Also needs to know if she needs another TB injection?  If so, when? Will be at work until 3, after that please call at home [phone]

## 2010-05-13 NOTE — Telephone Encounter (Signed)
Faxed Vaccine records to [facility] at [phone] attn: Alan Ripper. I advised pt that I don't think she needs another tb skin test but to check with Alan Ripper to make sure.

## 2010-05-19 NOTE — Op Note (Signed)
Jillian Escobar, Jillian Escobar              ACCOUNT NO.:  [id number]   MEDICAL RECORD NO.:  [id number]          PATIENT TYPE:  AMB   LOCATION:  DAY                          FACILITY:  [facility]   PHYSICIAN:  Lennie Muckle, MD      DATE OF BIRTH:  1950-03-05   DATE OF PROCEDURE:  10/06/2007  DATE OF DISCHARGE:                               OPERATIVE REPORT   PREOPERATIVE DIAGNOSIS:  Biliary colic.   POSTOPERATIVE DIAGNOSIS:  Biliary colic.   PROCEDURE:  Laparoscopic cholecystectomy with attempted intraoperative  cholangiogram.   SURGEON:  Lennie Muckle, MD.   ASSISTANT:  Adolph Pollack, M.D.   FINDINGS:  One stone within the gallbladder.  No immediate  complications.  Minimal amount of blood loss.   INDICATIONS FOR PROCEDURE:  Jillian Escobar is a 60 year old female who I  had seen after she had a bout of epigastric discomfort.  She was seen in  the [facility] emergency department and had an ultrasound which  revealed several stones within the gallbladder.  She had a mild  elevation of AST and ALT with normal bilirubin.  She had had several  episodes within the past 2 years.  Her diagnosis seemed consistent with  symptomatic cholelithiasis.  Informed consent was obtained for  laparoscopic cholecystectomy with possible open procedure.   DESCRIPTION OF PROCEDURE:  Jillian Escobar was identified in the  preoperative holding area.  Received a gram of cefoxitin and was taken  to the operating room.  Once in the operating room placed in a supine  position.  After administration of general endotracheal anesthesia her  abdomen was prepped and draped in the usual sterile fashion.  A time-out  procedure indicating the patient and procedure were performed.  I placed  incision at the umbilicus with #11 blade.  Veress needle placed in  abdominal cavity.  After adequate pneumo insufflation an 11 mm trocar  was placed in the abdominal cavity.  The abdominal layers were  identified as coming through the  trocar.  The abdominal wall layers were  seen as they came through with Optiview.  There was no evidence of  injury upon placement of the trocar or the Veress needle.  The patient  was positioned with head up and rotated to her left.  I placed three  ports, one at the epigastric region and two at the right side of the  abdomen under visualization with camera.  The fundus of the gallbladder  was retracted toward head of the patient.  Infundibulum grabbed away  from the liver bed.  Using electrocautery and the [place] forceps I  dissected the peritoneum around the infundibulum.  I was able to  visualize the cystic duct and the cystic artery which were posterior  medial to the cystic duct.  I dissected the cystic artery and divided  this.  The cystic duct was clipped proximally.  I made an enterotomy  into the cystic duct.  Cholangiocatheter was introduced in the abdominal  cavity under visualization with the camera.  I attempted to place the  catheter into the cystic duct but it did  not easily pass.  A small  amount of bile did reflux back through the cystic duct.  After several  attempts I decided to abort the cholangiogram.  There did not appear to  be any stones within the cystic duct and no smaller material was seen.  I then placed three clips proximally and fully transected the cystic  duct.  There was also a posterior branch of the cystic artery which I  clipped and divided.  The remaining peritoneal attachments were divided  with electrocautery.  Once the gallbladder was removed I placed a Ray-  Tec in the abdomen and swabbed the liver bed.  There was no significant  amount of bleeding.  There was no bleeding noted.  I then placed the Ray-  Tec and the gallbladder within an EndoCatch bag.  I removed it from the  umbilicus.  Final inspection of the liver bed revealed no injury, no  bleeding.  Exploration of the abdomen revealed no injury.  I then closed  the fascial defect at the  umbilicus with zero Vicryl suture.  This was  visualized with the camera.  I then released the pneumoperitoneum and  removed the camera and all three ports, closed the skin with 4-0  Monocryl and used approximately 30 mL of Marcaine.  Dermabond was placed  for final dressing.  The patient was then extubated and transported to  post anesthesia care unit in stable condition.  She will be monitored  postoperatively.  If she has nausea she will likely stay the night and  be discharged home in the morning.      Lennie Muckle, MD  Electronically Signed     ALA/MEDQ  D:  10/06/2007  T:  10/06/2007  Job:  [id number]   cc:   Quita Skye. Artis Flock, M.D.  Fax: [phone]

## 2010-05-21 ENCOUNTER — Ambulatory Visit (INDEPENDENT_AMBULATORY_CARE_PROVIDER_SITE_OTHER): Payer: BC Managed Care – PPO | Admitting: Internal Medicine

## 2010-05-21 ENCOUNTER — Encounter: Payer: Self-pay | Admitting: Internal Medicine

## 2010-05-21 VITALS — BP 120/88 | Temp 98.6°F | Wt 158.0 lb

## 2010-05-21 DIAGNOSIS — J069 Acute upper respiratory infection, unspecified: Secondary | ICD-10-CM

## 2010-05-21 DIAGNOSIS — R21 Rash and other nonspecific skin eruption: Secondary | ICD-10-CM

## 2010-05-21 DIAGNOSIS — J029 Acute pharyngitis, unspecified: Secondary | ICD-10-CM

## 2010-05-21 LAB — POCT RAPID STREP A (OFFICE): Rapid Strep A Screen: NEGATIVE

## 2010-05-21 MED ORDER — DOXYCYCLINE HYCLATE 100 MG PO TABS: 100.0000 mg | ORAL_TABLET | Freq: Two times a day (BID) | ORAL | Status: AC

## 2010-05-21 NOTE — Assessment & Plan Note (Signed)
Rapid strep obtained and negative. Use otc symptomatic relief prn. Given abx to hold. Begin abx if no improvement of sx's after total of 8-10 days. Followup if no improvement or worsening.

## 2010-05-21 NOTE — Assessment & Plan Note (Signed)
Begin triamcinolone bid (has at home)

## 2010-05-21 NOTE — Progress Notes (Signed)
  Subjective:    Patient ID: Jillian Escobar, female    DOB: 1950-08-25, 60 y.o.   MRN: [id number]  HPI Pt presents to clinic for evaluation of sorethroat. Notes 5 day h/o ST, cough, and ear discomfort without significant improvement. Denies fever, chills, ear drainage or wheezing. Works in the school system with multiple sick exposures. Used vick's vapor rub on upper chest and neck several days ago and recently developed itchy rash in similar area. Has used rub before without side effect. No alleviating or exacerbating factors. No other complaints.  Reviewed pmh, medications and allergies.    Review of Systems  Constitutional: Negative for fever and chills.  HENT: Positive for ear pain. Negative for congestion, rhinorrhea and ear discharge.   Respiratory: Positive for cough. Negative for shortness of breath and wheezing.   Skin: Positive for rash. Negative for pallor and wound.       Objective:   Physical Exam  [nursing notereviewed. Constitutional: She appears well-developed and well-nourished. No distress.  HENT:  Head: Normocephalic and atraumatic.  Right Ear: Tympanic membrane, external ear and ear canal normal.  Left Ear: Tympanic membrane, external ear and ear canal normal.  Nose: Nose normal.  Mouth/Throat: Uvula is midline and mucous membranes are normal. Posterior oropharyngeal erythema present. No oropharyngeal exudate, posterior oropharyngeal edema or tonsillar abscesses.  Eyes: Conjunctivae are normal. Right eye exhibits no discharge. Left eye exhibits no discharge. No scleral icterus.  Neck: Neck supple.  Cardiovascular: Normal rate, regular rhythm and normal heart sounds.  Exam reveals no gallop and no friction rub.   No murmur heard. Pulmonary/Chest: Effort normal and breath sounds normal. No respiratory distress. She has no wheezes. She has no rales.  Lymphadenopathy:    She has no cervical adenopathy.  Neurological: She is alert.  Skin: Skin is warm and dry. She  is not diaphoretic.  Psychiatric: She has a normal mood and affect.          Assessment & Plan:

## 2010-06-12 ENCOUNTER — Other Ambulatory Visit: Payer: Self-pay | Admitting: Internal Medicine

## 2010-06-16 ENCOUNTER — Other Ambulatory Visit: Payer: Self-pay | Admitting: Internal Medicine

## 2010-06-16 ENCOUNTER — Other Ambulatory Visit (INDEPENDENT_AMBULATORY_CARE_PROVIDER_SITE_OTHER): Payer: BC Managed Care – PPO

## 2010-06-16 ENCOUNTER — Other Ambulatory Visit: Payer: Self-pay

## 2010-06-16 DIAGNOSIS — Z0000 Encounter for general adult medical examination without abnormal findings: Secondary | ICD-10-CM

## 2010-06-16 LAB — CBC WITH DIFFERENTIAL/PLATELET
Basophils Absolute: 0 10*3/uL (ref 0.0–0.1)
Eosinophils Absolute: 0.2 10*3/uL (ref 0.0–0.7)
MCHC: 35 g/dL (ref 30.0–36.0)
MCV: 91.1 fl (ref 78.0–100.0)
Monocytes Absolute: 0.3 10*3/uL (ref 0.1–1.0)
Neutrophils Relative %: 42 % — ABNORMAL LOW (ref 43.0–77.0)
Platelets: 305 10*3/uL (ref 150.0–400.0)
WBC: 4 10*3/uL — ABNORMAL LOW (ref 4.5–10.5)

## 2010-06-16 LAB — URINALYSIS
Bilirubin Urine: NEGATIVE
Ketones, ur: NEGATIVE
Nitrite: NEGATIVE

## 2010-06-16 LAB — HEPATIC FUNCTION PANEL
ALT: 22 U/L (ref 0–35)
Alkaline Phosphatase: 55 U/L (ref 39–117)
Bilirubin, Direct: 0.1 mg/dL (ref 0.0–0.3)
Total Protein: 7.5 g/dL (ref 6.0–8.3)

## 2010-06-16 LAB — BASIC METABOLIC PANEL
BUN: 15 mg/dL (ref 6–23)
Chloride: 105 mEq/L (ref 96–112)
GFR: 80.7 mL/min (ref >60.00)
Potassium: 4 mEq/L (ref 3.5–5.1)
Sodium: 137 mEq/L (ref 135–145)

## 2010-06-16 LAB — LIPID PANEL: Triglycerides: 87 mg/dL (ref 0.0–149.0)

## 2010-06-16 LAB — LDL CHOLESTEROL, DIRECT: Direct LDL: 165.2 mg/dL

## 2010-06-23 ENCOUNTER — Encounter: Payer: Self-pay | Admitting: Internal Medicine

## 2010-06-23 ENCOUNTER — Ambulatory Visit (INDEPENDENT_AMBULATORY_CARE_PROVIDER_SITE_OTHER): Payer: BC Managed Care – PPO | Admitting: Internal Medicine

## 2010-06-23 VITALS — BP 110/70 | HR 72 | Temp 98.1°F | Resp 16 | Ht 63.0 in | Wt 156.0 lb

## 2010-06-23 DIAGNOSIS — Z0000 Encounter for general adult medical examination without abnormal findings: Secondary | ICD-10-CM

## 2010-06-23 DIAGNOSIS — Z79890 Hormone replacement therapy (postmenopausal): Secondary | ICD-10-CM

## 2010-06-23 DIAGNOSIS — Z8249 Family history of ischemic heart disease and other diseases of the circulatory system: Secondary | ICD-10-CM

## 2010-06-23 DIAGNOSIS — E039 Hypothyroidism, unspecified: Secondary | ICD-10-CM

## 2010-06-23 DIAGNOSIS — E785 Hyperlipidemia, unspecified: Secondary | ICD-10-CM

## 2010-06-23 DIAGNOSIS — K219 Gastro-esophageal reflux disease without esophagitis: Secondary | ICD-10-CM

## 2010-06-23 NOTE — Progress Notes (Signed)
  Subjective:    Patient ID: Jillian Escobar, female    DOB: [birth year], 60 y.o.   MRN: [id number]  HPI  Patient comes in today for preventive visit and follow-up of medical issues. Update of her history since her last visit.  Since her last visit her rash is better and felt secondary to great moldy cheese obtianed in  [place].   Stopped the cheese and got better. Otherwise no change in   Health  Had colon  Ok Thyroid : on meds  No se  LIPIDS  :  Could eat healthier  HRT taking   No se of med    Review of Systems ROS:  GEN/ HEENTNo fever, significant weight changes sweats headaches vision problems hearing changes, CV/ PULM; No chest pain shortness of breath cough, syncope,edema  change in exercise tolerance. GI /GU: No adominal pain, vomiting, change in bowel habits. No blood in the stool. No significant GU symptoms. gerd stable  SKIN/HEME: ,no acute skin rashes suspicious lesions or bleeding. No lymphadenopathy, nodules, masses.  NEURO/ PSYCH:  No neurologic signs such as weakness numbness No depression anxiety. IMM/ Allergy: No unusual infections.  Allergy .   REST of 12 system review negative  Past history family history social history reviewed in the electronic medical record.      Objective:   Physical Exam Physical Exam: Vital signs reviewed WUJ:WJXB is a well-developed well-nourished alert cooperative  white female who appears her stated age in no acute distress.  HEENT: normocephalic  traumatic , Eyes: PERRL EOM's full, conjunctiva clear, Nares: paten,t no deformity discharge or tenderness., Ears: no deformity EAC's clear TMs with normal landmarks. Mouth: clear OP, no lesions, edema.  Moist mucous membranes. Dentition in adequate repair. NECK: supple without masses, thyromegaly or bruits. Breast: normal by inspection . No dimpling, discharge, masses, tenderness or discharge . LN: no cervical axillary inguinal adenopathy CHEST/PULM:  Clear to auscultation and percussion  breath sounds equal no wheeze , rales or rhonchi. No chest wall deformities or tenderness. CV: PMI is nondisplaced, S1 S2 no gallops, murmurs, rubs. Peripheral pulses are full without delay.No JVD .  ABDOMEN: Bowel sounds normal nontender  No guard or rebound, no hepato splenomegal no CVA tenderness.  No hernia. Extremtities:  No clubbing cyanosis or edema, no acute joint swelling or redness no focal atrophy NEURO:  Oriented x3, cranial nerves 3-12 appear to be intact, no obvious focal weakness,gait within normal limits no abnormal reflexes or asymmetrical SKIN: No acute rashes normal turgor, color, no bruising or petechiae. PSYCH: Oriented, good eye contact, no obvious depression anxiety, cognition and judgment appear normal. Pap not indicated       Assessment & Plan:  Preventive Health Care Counseled regarding healthy nutrition, exercise, sleep, injury prevention, calcium vit d and healthy weight . UTD on parameters  Lipids    Need to be better .[name] Intensify lifestyle interventions.   Counseled.  GERD: disc about control with lifestyle changes also on meds for now Thyroid   On meds no change HRT  Try to wean to lower dose

## 2010-06-23 NOTE — Patient Instructions (Signed)
Low-Cholesterol Guidelines WHAT IS CHOLESTEROL? Cholesterol is a waxy, fat-like substance found only in animal fats. It is manufactured by the body and is essential to the functioning of the brain and many other organs. When eaten in excess, cholesterol can raise blood cholesterol levels or help form fatty deposits in blood vessel walls. WHAT IS SATURATED FAT? Saturated fats occur naturally in all foods of animal origin and in a few vegetable products such as coconut oil, palm or palm kernel oil, and cocoa butter (chocolate). Hydrogenated (hardened) fats such as vegetable shortening are also highly saturated. They are solid at room temperature and can also have the same harmful effects on your circulatory system as cholesterol. WHAT ARE UNSATURATED FATS? Unsaturated fats are fats of plant origin and are liquid at room temperature. Monounsaturated fats such as peanut oil, olive oil, and fish oils tend not to raise blood cholesterol levels and may even be beneficial in lowering them when used in place of saturated fats. Polyunsaturated fats such as safflower, sunflower, corn, soybean, sesame, and canola oils tend not to raise blood cholesterol when used in moderate amounts. HOW SHOULD I EAT?  Eat more fish and poultry (with no skin, visible fat, or breading). Eat less red meat.   Buy lean meat and trim all visible fat. Eat no more than 6 ounces (total cooked weight) of any meat, fish, or poultry each day.   Limit intake of fried or fatty foods.   Use low-fat dairy products such as skim milk, low- or non-fat yogurt, and low-fat cheese (try evaporated skim milk in your coffee instead of cream or nondairy creamers).   Eat five or more servings of fruits and vegetables each day. Keep some ready to eat in your refrigerator for a healthy snack.   Use heart healthy tub margarine or oils that are polyunsaturated rather than butter or vegetable shortening.   Avoid excessive use of foods known to be high  in cholesterol, such as egg yolk, liver, shellfish, duck, and goose. Limit egg yolks to no more than 4 per week.   Include complex carbohydrate foods such as whole-grain breads and cereals, beans, rice, and pasta. They contribute needed fiber and are very low in fat.  WHAT ELSE CAN I DO?  Remember to check all labels. Many foods contain hidden fats!   Bake, broil, or roast meat rather than frying them. Skip gravies and rich sauces.   Limit intake of rich pastries and desserts that are high in butter, eggs, cream, or shortening.   Choose a frozen fruit sorbet or low- or non-fat frozen yogurt rather than ice cream. (Some sherbets may contain whole milk, so check labels carefully!)   Use a "nonstick" cooking spray or "nonstick" pans.   Chill and skim fat off of meat stock or drippings when making soup or sauces.   Check labels for the words "hydrogenated" or "hardened" fats or oils, and avoid these products.  ARE THERE OTHER TERMS YOU MAY HAVE QUESTIONS ABOUT? HDL's (high-density lipoproteins) and LDL's (low-density lipoproteins) transport cholesterol in your blood. LDLs carry cholesterol into your cells and HDLs carry it away and dispose of it in the liver, having a protective effect on your circulatory system. High saturated fat, high cholesterol diets may increase the harmful LDL's. Not smoking, participating in aerobic activity, and losing weight if you are overweight may increase the beneficial HDL's. Triglycerides (TG) are fatty compounds. Body fat is made up of mostly stored triglycerides. Triglycerides also circulate in the blood stream  like cholesterol, and may have a link with heart disease. To lower triglycerides, maintain ideal body weight, limit intake of sugars and alcohol, and eat a low-fat diet. Document Released: 06/12/2001 Document Re-Released: 01/10/2007 [facility] Patient Information 2011 [place], [place].   Call for refills  Try weaning the prempro when  Weather less  hot.  cpx with  Labs  in a year

## 2010-06-28 ENCOUNTER — Encounter: Payer: Self-pay | Admitting: Internal Medicine

## 2010-06-28 DIAGNOSIS — Z79890 Hormone replacement therapy (postmenopausal): Secondary | ICD-10-CM | POA: Insufficient documentation

## 2010-06-28 DIAGNOSIS — Z0000 Encounter for general adult medical examination without abnormal findings: Secondary | ICD-10-CM | POA: Insufficient documentation

## 2010-07-10 ENCOUNTER — Telehealth: Payer: Self-pay | Admitting: *Deleted

## 2010-07-10 MED ORDER — SYNTHROID 50 MCG PO TABS: 50.0000 ug | ORAL_TABLET | Freq: Every day | ORAL | Status: DC

## 2010-07-10 NOTE — Telephone Encounter (Signed)
Rx called in 

## 2010-10-01 LAB — COMPREHENSIVE METABOLIC PANEL
AST: 45 — ABNORMAL HIGH
Albumin: 3.4 — ABNORMAL LOW
Alkaline Phosphatase: 54
Chloride: 107
Creatinine, Ser: 0.84
GFR calc Af Amer: >60
Potassium: 3.7
Total Bilirubin: 0.9

## 2010-10-01 LAB — POCT CARDIAC MARKERS: CKMB, poc: <1 — ABNORMAL LOW

## 2010-10-01 LAB — CBC
Platelets: 293
WBC: 5.6

## 2010-10-01 LAB — POCT I-STAT, CHEM 8
Creatinine, Ser: 0.9
Glucose, Bld: 109 — ABNORMAL HIGH
Hemoglobin: 14.3
TCO2: 25

## 2010-10-05 LAB — PREGNANCY, URINE: Preg Test, Ur: NEGATIVE

## 2010-10-05 LAB — HEMOGLOBIN AND HEMATOCRIT, BLOOD: HCT: 42.2

## 2010-10-26 ENCOUNTER — Ambulatory Visit (INDEPENDENT_AMBULATORY_CARE_PROVIDER_SITE_OTHER): Payer: BC Managed Care – PPO | Admitting: Internal Medicine

## 2010-10-26 ENCOUNTER — Encounter: Payer: Self-pay | Admitting: Internal Medicine

## 2010-10-26 VITALS — BP 118/70 | HR 83 | Temp 98.3°F | Wt 166.0 lb

## 2010-10-26 DIAGNOSIS — B029 Zoster without complications: Secondary | ICD-10-CM | POA: Insufficient documentation

## 2010-10-26 HISTORY — DX: Zoster without complications: B02.9

## 2010-10-26 MED ORDER — VALACYCLOVIR HCL 1 G PO TABS: 1000.0000 mg | ORAL_TABLET | Freq: Three times a day (TID) | ORAL | Status: AC

## 2010-10-26 NOTE — Patient Instructions (Addendum)
Take ibuprofen 800  Every 8 hours for pain. Consider  Pain for nerve pain  Gabapentin elavil and lyrica can cause  Sedation but can help pain if needed.  Shingles Shingles is caused by the same virus that causes chickenpox (varicella zoster virus or VZV). Shingles often occurs many years or decades after having chickenpox. That is why it is more common in adults older than 50 years. The virus reactivates and breaks out as an infection in a nerve root. SYMPTOMS   The initial feeling (sensations) may be pain. This pain is usually described as:   Burning.   Stabbing.   Throbbing.   Tingling in the nerve root.   A red rash will follow in a couple days. The rash may occur in any area of the body and is usually on one side (unilateral) of the body in a band or belt-like pattern. The rash usually starts out as very small blisters (vesicles). They will dry up after 7 to 10 days. This is not usually a significant problem except for the pain it causes.   Long-lasting (chronic) pain is more likely in an elderly person. It can last months to years. This condition is called postherpetic neuralgia.  Shingles can be an extremely severe infection in someone with AIDS, a weakened immune system, or with forms of leukemia. It can also be severe if you are taking transplant medicines or other medicines that weaken the immune system. TREATMENT  Your caregiver will often treat you with:  Antiviral drugs.   Anti-inflammatory drugs.   Pain medicines.  Bed rest is very important in preventing the pain associated with herpes zoster (postherpetic neuralgia). Application of heat in the form of a hot water bottle or electric heating pad or gentle pressure with the hand is recommended to help with the pain or discomfort. PREVENTION  A varicella zoster vaccine is available to help protect against the virus. The Food and Drug Administration approved the varicella zoster vaccine for individuals 48 years of age and  older. HOME CARE INSTRUCTIONS   Cool compresses to the area of rash may be helpful.   Only take over-the-counter or prescription medicines for pain, discomfort, or fever as directed by your caregiver.   Avoid contact with:   Babies.   Pregnant women.   Children with eczema.   Elderly people with transplants.   People with chronic illnesses, such as leukemia and AIDS.   If the area involved is on your face, you may receive a referral for follow-up to a specialist. It is very important to keep all follow-up appointments. This will help avoid eye complications, chronic pain, or disability.  SEEK IMMEDIATE MEDICAL CARE IF:   You develop any pain (headache) in the area of the face or eye. This must be followed carefully by your caregiver or ophthalmologist. An infection in part of your eye (cornea) can be very serious. It could lead to blindness.   You do not have pain relief from prescribed medicines.   Your redness or swelling spreads.   The area involved becomes very swollen and painful.   You have a fever.   You notice any red or painful lines extending away from the affected area toward your heart (lymphangitis).   Your condition is worsening or has changed.  Document Released: 12/21/2004 Document Revised: 09/02/2010 Document Reviewed: 11/25/2008 [facility] Patient Information 2012 [place], [place].

## 2010-10-26 NOTE — Progress Notes (Signed)
  Subjective:    Patient ID: Jillian Escobar, female    DOB: [birth year], 60 y.o.   MRN: [id number]  HPI Patient comes in today for SDA  For acute problem evaluation.  Onset like a pulled muscle And then had a rash  In same area l hurting and getting worse.  Onset about 5 days ago and red areas spreading. Hurts a bit  .   Tolerated fairly well. Sensitive to touch .  No other sx no associated sx . No help with mobic   Review of Systems No fever has reg back pain and takes advil of mobic / no help .  No weakness of numbness.  Exposed to children not to varicella.  Past history family history social history reviewed in the electronic medical record.     Objective:   Physical Exam WDWN in nad  Skin: right flank and abd area with red blotches and vesicles  Some increase on abdomen  T 8-10 Well otherwise .  Gait nl  Affect normal     Assessment & Plan:  Acute shingles Discussion about rx and plan.  Ok to get vaccine  Well in to the future. Call if need help with pain management. Total visit > 50% spent counseling and coordinating care

## 2010-11-16 ENCOUNTER — Other Ambulatory Visit: Payer: Self-pay | Admitting: Internal Medicine

## 2010-11-16 DIAGNOSIS — Z1231 Encounter for screening mammogram for malignant neoplasm of breast: Secondary | ICD-10-CM

## 2011-01-01 ENCOUNTER — Ambulatory Visit
Admission: RE | Admit: 2011-01-01 | Discharge: 2011-01-01 | Disposition: A | Discharge status: Home / Self Care | Payer: BC Managed Care – PPO | Source: Ambulatory Visit | Attending: Internal Medicine | Admitting: Internal Medicine

## 2011-01-01 DIAGNOSIS — Z1231 Encounter for screening mammogram for malignant neoplasm of breast: Secondary | ICD-10-CM

## 2011-07-03 ENCOUNTER — Other Ambulatory Visit: Payer: Self-pay | Admitting: Internal Medicine

## 2011-07-08 ENCOUNTER — Other Ambulatory Visit: Payer: Self-pay | Admitting: Internal Medicine

## 2011-07-12 ENCOUNTER — Telehealth: Payer: Self-pay | Admitting: Internal Medicine

## 2011-07-12 ENCOUNTER — Other Ambulatory Visit: Payer: Self-pay | Admitting: Internal Medicine

## 2011-07-12 NOTE — Telephone Encounter (Signed)
Pt called to check on status of getting refill of PREMPRO 0.625-2.5 MG [facility] [address] Pt originally req this on 07/07/11. Pt has check with pharmacy and nothing has been rcvd by Dr Fabian Sharp. Pls call in asap. Pt completely out of med.

## 2011-07-12 NOTE — Telephone Encounter (Signed)
Medication sent to the pharmacy.  Pt notified by telephone.  Sent with 1 refill.  Pt notified that it has been more than 1 year since her last physical.  She said she will call in the morning to make the appt.  This is the reason the extra refill was sent.  It can be hard to schedule CPX appts with [facility].

## 2011-07-12 NOTE — Telephone Encounter (Signed)
Pt is out of med

## 2011-07-13 NOTE — Telephone Encounter (Signed)
Pt called and went [facility] [address] re: PREMPRO 0.625-2.5 MG per tablet . Pt said that pharmacy did not rcv script. Pls resend with refills.

## 2011-07-13 NOTE — Telephone Encounter (Signed)
Spoke to the pharmacy.  The pt had already picked up the prescription.  However, they did not receive any refills.  Gave 1 refill to last the patient until she sees Dr. Fabian Sharp in August for her cpx.

## 2011-07-26 ENCOUNTER — Encounter: Payer: Self-pay | Admitting: Internal Medicine

## 2011-07-26 ENCOUNTER — Ambulatory Visit (INDEPENDENT_AMBULATORY_CARE_PROVIDER_SITE_OTHER): Payer: BC Managed Care – PPO | Admitting: Internal Medicine

## 2011-07-26 VITALS — BP 130/80 | HR 85 | Temp 98.3°F | Wt 167.0 lb

## 2011-07-26 DIAGNOSIS — H9209 Otalgia, unspecified ear: Secondary | ICD-10-CM

## 2011-07-26 DIAGNOSIS — J329 Chronic sinusitis, unspecified: Secondary | ICD-10-CM

## 2011-07-26 DIAGNOSIS — R05 Cough: Secondary | ICD-10-CM

## 2011-07-26 DIAGNOSIS — H9202 Otalgia, left ear: Secondary | ICD-10-CM

## 2011-07-26 DIAGNOSIS — R0982 Postnasal drip: Secondary | ICD-10-CM

## 2011-07-26 MED ORDER — FLUTICASONE PROPIONATE 50 MCG/ACT NA SUSP: 2.0000 | Freq: Every day | NASAL | Status: DC

## 2011-07-26 MED ORDER — AMOXICILLIN-POT CLAVULANATE 875-125 MG PO TABS: 1.0000 | ORAL_TABLET | Freq: Two times a day (BID) | ORAL | Status: AC

## 2011-07-26 NOTE — Patient Instructions (Addendum)
This ear pain seems like eustachian tube dysfunction.    and  chronic drainage contributing.   Poss allergy with the cough and drainage .  Add zyrtec D to try and  flonase type medication.   If not a lot better in 3 days or so add antibiotic.

## 2011-07-26 NOTE — Progress Notes (Signed)
  Subjective:    Patient ID: Jillian Escobar, female    DOB: 1950-05-04, 61 y.o.   MRN: [id number]  HPI Patient comes in today for SDA for  new problem evaluation. Cough for a month  Comes and goes  scrathcy throat.   Tried no meds. Sore throat off and on tjhen this am ear and  Left hurts to swallow. Was in [place].  Hx of antibiotic for tooth  Ended a month ago.  Remote hx of zyrtec.  Review of Systems No fever  Cp sob  Vision changes hearing clogged some  No new headache  Past history family history social history reviewed in the electronic medical record.    Objective:   Physical Exam BP 130/80  Pulse 85  Temp 98.3 F (36.8 C) (Oral)  Wt 167 lb (75.751 kg)  SpO2 99% WDWN in NAD  quiet respirations; mildly congested  somewhat hoarse. Non toxic . HEENT: Normocephalic ;atraumatic , Eyes;  PERRL, EOMs  Full, lids and conjunctiva clear,,Ears: no deformities, canals nl, TM landmarks normal, Nose: no deformity or discharge but congested;face minimally tender Mouth : OP clear without lesion or edema . Neck: Supple without adenopathy or masses or bruits Chest:  Clear to A&P without wheezes rales or rhonchi CV:  S1-S2 no gallops or murmurs peripheral perfusion is normal Skin :nl perfusion and no acute rashes .    Assessment & Plan:  Rhinitis  Recurrent cough : allergic post nasal drainage;  Sinusitis poss early  Ear pain  :At this time eustachian tube dysfunction  rx  nasal steroid and ; sx rx     Expectant management. And alarm features  To contact us.  Add antibiotic as directed if needed.

## 2011-07-27 ENCOUNTER — Other Ambulatory Visit (INDEPENDENT_AMBULATORY_CARE_PROVIDER_SITE_OTHER): Payer: BC Managed Care – PPO

## 2011-07-27 DIAGNOSIS — Z0000 Encounter for general adult medical examination without abnormal findings: Secondary | ICD-10-CM

## 2011-07-27 LAB — CBC WITH DIFFERENTIAL/PLATELET
Basophils Relative: 0.8 % (ref 0.0–3.0)
Eosinophils Relative: 2.8 % (ref 0.0–5.0)
Lymphocytes Relative: 35.8 % (ref 12.0–46.0)
MCV: 91.1 fl (ref 78.0–100.0)
Monocytes Absolute: 0.6 10*3/uL (ref 0.1–1.0)
Monocytes Relative: 10.3 % (ref 3.0–12.0)
Neutrophils Relative %: 50.3 % (ref 43.0–77.0)
RBC: 4.55 Mil/uL (ref 3.87–5.11)
WBC: 6.1 10*3/uL (ref 4.5–10.5)

## 2011-07-27 LAB — POCT URINALYSIS DIPSTICK
Glucose, UA: NEGATIVE
Nitrite, UA: NEGATIVE
Protein, UA: NEGATIVE
Urobilinogen, UA: 0.2

## 2011-07-27 LAB — HEPATIC FUNCTION PANEL
ALT: 14 U/L (ref 0–35)
AST: 19 U/L (ref 0–37)
Alkaline Phosphatase: 56 U/L (ref 39–117)
Bilirubin, Direct: 0 mg/dL (ref 0.0–0.3)
Total Protein: 7.4 g/dL (ref 6.0–8.3)

## 2011-07-27 LAB — BASIC METABOLIC PANEL
Chloride: 102 mEq/L (ref 96–112)
Creatinine, Ser: 0.9 mg/dL (ref 0.4–1.2)
GFR: 72.8 mL/min (ref >60.00)
Potassium: 4 mEq/L (ref 3.5–5.1)

## 2011-07-27 LAB — LIPID PANEL
LDL Cholesterol: 122 mg/dL — ABNORMAL HIGH (ref 0–99)
Total CHOL/HDL Ratio: 4

## 2011-07-30 DIAGNOSIS — R0982 Postnasal drip: Secondary | ICD-10-CM | POA: Insufficient documentation

## 2011-07-30 DIAGNOSIS — H9202 Otalgia, left ear: Secondary | ICD-10-CM | POA: Insufficient documentation

## 2011-07-30 DIAGNOSIS — R05 Cough: Secondary | ICD-10-CM | POA: Insufficient documentation

## 2011-08-10 ENCOUNTER — Ambulatory Visit (INDEPENDENT_AMBULATORY_CARE_PROVIDER_SITE_OTHER): Payer: BC Managed Care – PPO | Admitting: Internal Medicine

## 2011-08-10 ENCOUNTER — Encounter: Payer: Self-pay | Admitting: Internal Medicine

## 2011-08-10 VITALS — BP 124/80 | HR 93 | Temp 98.1°F | Ht 63.75 in | Wt 168.0 lb

## 2011-08-10 DIAGNOSIS — Z0000 Encounter for general adult medical examination without abnormal findings: Secondary | ICD-10-CM

## 2011-08-10 DIAGNOSIS — Z820 Family history of epilepsy and other diseases of the nervous system: Secondary | ICD-10-CM | POA: Insufficient documentation

## 2011-08-10 DIAGNOSIS — K219 Gastro-esophageal reflux disease without esophagitis: Secondary | ICD-10-CM

## 2011-08-10 DIAGNOSIS — N951 Menopausal and female climacteric states: Secondary | ICD-10-CM

## 2011-08-10 DIAGNOSIS — E039 Hypothyroidism, unspecified: Secondary | ICD-10-CM

## 2011-08-10 DIAGNOSIS — Z79890 Hormone replacement therapy (postmenopausal): Secondary | ICD-10-CM

## 2011-08-10 MED ORDER — SYNTHROID 50 MCG PO TABS: 50.0000 ug | ORAL_TABLET | Freq: Every day | ORAL | Status: DC

## 2011-08-10 MED ORDER — CONJ ESTROG-MEDROXYPROGEST ACE 0.45-1.5 MG PO TABS: 1.0000 | ORAL_TABLET | Freq: Every day | ORAL | Status: DC

## 2011-08-10 NOTE — Progress Notes (Signed)
Subjective:    Patient ID: Jillian Escobar, female    DOB: [birth year], 61 y.o.   MRN: [id number]  HPI Patient comes in today for preventive visit and follow-up of medical issues. Update  history since  last visit: Her respiratory symptoms are better although she still has a drainage cough. Pain is better she didn't take the antibiotic. Hormone replacement therapy she tried to wean off and got severe hot flashes after a month's went back on daily and is now doing every other day of the 0.625 2.5 Taking Flonase nasal spray and Nexium every other day. She's taking Synthroid every day also without missed doses. She's off for the summer but there is a lot of stress with caretaking with her father has Alzheimer's and heart failure and her mother has anxiety. She wishes to enter a study [employer]. that's related to exercise for 8 months and cognitive function in  high-risk families  Review of Systems Negative chest pain shortness of breath vision hearing changes breast lumps change in bowel habits unusual bleeding skin or joint changes. She had shingles last fall. 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     Objective:   Physical Exam  BP 124/80  Pulse 93  Temp 98.1 F (36.7 C) (Oral)  Ht 5' 3.75" (1.619 m)  Wt 168 lb (76.204 kg)  BMI 29.06 kg/m2  SpO2 98% Physical Exam: Vital signs reviewed WUJ:WJXB is a well-developed well-nourished alert cooperative  white female who appears her stated age in no acute distress.  HEENT: normocephalic atraumatic , Eyes: PERRL EOM's full, conjunctiva clear, Nares: paten,t no deformity discharge or tenderness., Ears: no deformity EAC's clear slight amount of wax in left ear TMs with normal landmarks. Mouth: clear OP, no lesions, edema.  Moist mucous membranes. Dentition in adequate repair. NECK: supple without masses, thyromegaly or bruits. CHEST/PULM:  Clear to auscultation and percussion breath sounds equal  no wheeze , rales or rhonchi. No chest wall deformities or tenderness. Breast: normal by inspection . No dimpling, discharge, masses, tenderness or discharge . CV: PMI is nondisplaced, S1 S2 no gallops, murmurs, rubs. Peripheral pulses are full without delay.No JVD .  ABDOMEN: Bowel sounds normal nontender  No guard or rebound, no hepato splenomegal no CVA tenderness.  No hernia. Extremtities:  No clubbing cyanosis or edema, no acute joint swelling or redness no focal atrophy NEURO:  Oriented x3, cranial nerves 3-12 appear to be intact, no obvious focal weakness,gait within normal limits no abnormal reflexes or asymmetrical SKIN: No acute rashes normal turgor, color, no bruising or petechiae. PSYCH: Oriented, good eye contact, no obvious depression anxiety, cognition and judgment appear normal. LN: no cervical axillary inguinal adenopathy Lab Results  Component Value Date   WBC 6.1 07/27/2011   HGB 14.3 07/27/2011   HCT 41.5 07/27/2011   PLT 363.0 07/27/2011   GLUCOSE 93 07/27/2011   CHOL 186 07/27/2011   TRIG 104.0 07/27/2011   HDL 43.10 07/27/2011   LDLDIRECT 165.2 06/16/2010   LDLCALC 122* 07/27/2011   ALT 14 07/27/2011   AST 19 07/27/2011   NA 137 07/27/2011   K 4.0 07/27/2011   CL 102 07/27/2011   CREATININE 0.9 07/27/2011   BUN 14 07/27/2011   CO2 26 07/27/2011   TSH 2.96 07/27/2011       Assessment & Plan:  Preventive Health Care Counseled regarding healthy nutrition, exercise, sleep, injury prevention, calcium vit d and healthy weight . Up  to date can get shingles vaccine next year. Hypothyroid  refill branded medication for a year Number and replacement therapy for hot flashes and vasomotor symptoms. Try decreasing dose to 0.45 and use daily and then switch to every other day to minimize dose consider decreasing doses after that. Get her yearly mammogram due for Pap next year Family history of Alzheimer's okay for study form signed today. Caretaker stress father in a hospice care GERD  controlled using every other day Nexium.

## 2011-08-10 NOTE — Patient Instructions (Signed)
Exercises helpful for anxiety stress cholesterol and your heart and also is a possible help to prevent dementia.  Try decreasing the Prempro to 0.45 1.5  take it daily, then try every other day or 4 times a week. Plan on Pap smear and shingle shot next year.  Continue thyroid medication. Checkup in one year or as needed make sure you get your yearly mammogram. Pap smear due next year

## 2011-08-12 ENCOUNTER — Telehealth: Payer: Self-pay | Admitting: Internal Medicine

## 2011-08-12 DIAGNOSIS — E039 Hypothyroidism, unspecified: Secondary | ICD-10-CM

## 2011-08-12 MED ORDER — LEVOTHYROXINE SODIUM 50 MCG PO TABS: 50.0000 ug | ORAL_TABLET | Freq: Every day | ORAL | Status: DC

## 2011-08-12 NOTE — Telephone Encounter (Signed)
Ok to change to generic but remember there can be a variation in potency from batch to batch.Synthroid cost only slightly more when paying cash .and gives a more reliable steady state.     if taking   generic ; Repeat TSH in about 4-6 months  ( no OV just lab )  if ok then yearly  check as we are doing.

## 2011-08-12 NOTE — Telephone Encounter (Signed)
Spoke to [place] (husband) and informed him that I will send in generic.  Pt needs TSH in 4-6 months.  Orders have been placed in the system.

## 2011-08-12 NOTE — Telephone Encounter (Signed)
Patient called stating that her rx for synthroid need to be worded the generic can be substituted. This will save the patient a significant amount of money. Please assist.

## 2011-08-12 NOTE — Telephone Encounter (Signed)
Ok to switch to generic? 

## 2011-08-17 ENCOUNTER — Telehealth: Payer: Self-pay | Admitting: Family Medicine

## 2011-08-17 ENCOUNTER — Telehealth: Payer: Self-pay | Admitting: Internal Medicine

## 2011-08-17 MED ORDER — LEVOTHYROXINE SODIUM 50 MCG PO TABS: 50.0000 ug | ORAL_TABLET | Freq: Every day | ORAL | Status: DC

## 2011-08-17 NOTE — Telephone Encounter (Signed)
Patient wanted brand name sent to [facility].  Sent in by e-scribe.

## 2011-08-17 NOTE — Telephone Encounter (Signed)
Patient changed back to brand name Synthroid.  Does she still need to recheck her labs in a few months?  She never received generic medication.

## 2011-08-17 NOTE — Telephone Encounter (Signed)
Pt called and has questions re: the script for Synthroid and another tsh test. Pls call asap.

## 2011-08-22 NOTE — Telephone Encounter (Signed)
dont need recheck tsh until due for her yearly labs  If she never changed off of brand and same dose.

## 2011-08-23 NOTE — Telephone Encounter (Signed)
Pt.notified

## 2011-10-30 ENCOUNTER — Other Ambulatory Visit: Payer: Self-pay | Admitting: Internal Medicine

## 2012-01-03 ENCOUNTER — Other Ambulatory Visit: Payer: Self-pay | Admitting: Internal Medicine

## 2012-01-03 DIAGNOSIS — Z1231 Encounter for screening mammogram for malignant neoplasm of breast: Secondary | ICD-10-CM

## 2012-02-02 ENCOUNTER — Ambulatory Visit: Payer: BC Managed Care – PPO

## 2012-02-16 ENCOUNTER — Ambulatory Visit: Payer: BC Managed Care – PPO

## 2012-03-08 ENCOUNTER — Ambulatory Visit
Admission: RE | Admit: 2012-03-08 | Discharge: 2012-03-08 | Disposition: A | Discharge status: Home / Self Care | Payer: BC Managed Care – PPO | Source: Ambulatory Visit | Attending: Internal Medicine | Admitting: Internal Medicine

## 2012-03-08 DIAGNOSIS — Z1231 Encounter for screening mammogram for malignant neoplasm of breast: Secondary | ICD-10-CM

## 2012-04-11 ENCOUNTER — Other Ambulatory Visit: Payer: Self-pay | Admitting: Internal Medicine

## 2012-04-12 ENCOUNTER — Telehealth: Payer: Self-pay | Admitting: Family Medicine

## 2012-04-12 ENCOUNTER — Other Ambulatory Visit: Payer: Self-pay | Admitting: Family Medicine

## 2012-04-12 DIAGNOSIS — Z0000 Encounter for general adult medical examination without abnormal findings: Secondary | ICD-10-CM

## 2012-04-12 NOTE — Telephone Encounter (Signed)
I have not spoke to the pt.  She will need a CPE and lab work in July or August.  Please contact the patient and make these appointments.  I refilled her Synthroid for 3 months.  Thanks!!

## 2012-04-13 NOTE — Telephone Encounter (Signed)
lmom for pt to call and schedule appt

## 2012-04-14 ENCOUNTER — Telehealth: Payer: Self-pay | Admitting: Internal Medicine

## 2012-04-14 MED ORDER — CONJ ESTROG-MEDROXYPROGEST ACE 0.45-1.5 MG PO TABS: 1.0000 | ORAL_TABLET | Freq: Every day | ORAL | Status: DC

## 2012-04-14 NOTE — Telephone Encounter (Signed)
Pt needs refill of estrogen, conjugated,-medroxyprogesterone (PREMPRO) 0.45-1.5 MG per tablet Made the CPX appt for pt.  [facility]

## 2012-04-14 NOTE — Telephone Encounter (Signed)
appt set/kh 

## 2012-07-18 ENCOUNTER — Other Ambulatory Visit: Payer: Self-pay | Admitting: Internal Medicine

## 2012-08-14 ENCOUNTER — Other Ambulatory Visit (INDEPENDENT_AMBULATORY_CARE_PROVIDER_SITE_OTHER): Payer: BC Managed Care – PPO

## 2012-08-14 DIAGNOSIS — R7989 Other specified abnormal findings of blood chemistry: Secondary | ICD-10-CM

## 2012-08-14 DIAGNOSIS — Z0000 Encounter for general adult medical examination without abnormal findings: Secondary | ICD-10-CM

## 2012-08-14 LAB — HEPATIC FUNCTION PANEL
ALT: 13 U/L (ref 0–35)
AST: 17 U/L (ref 0–37)
Bilirubin, Direct: 0.1 mg/dL (ref 0.0–0.3)
Total Protein: 6.8 g/dL (ref 6.0–8.3)

## 2012-08-14 LAB — CBC WITH DIFFERENTIAL/PLATELET
Basophils Relative: 1.1 % (ref 0.0–3.0)
Eosinophils Absolute: 0.1 10*3/uL (ref 0.0–0.7)
Lymphocytes Relative: 40.7 % (ref 12.0–46.0)
MCHC: 34.2 g/dL (ref 30.0–36.0)
MCV: 91.9 fl (ref 78.0–100.0)
Monocytes Absolute: 0.4 10*3/uL (ref 0.1–1.0)
Neutrophils Relative %: 44.8 % (ref 43.0–77.0)
Platelets: 308 10*3/uL (ref 150.0–400.0)
RBC: 4.53 Mil/uL (ref 3.87–5.11)
WBC: 3.8 10*3/uL — ABNORMAL LOW (ref 4.5–10.5)

## 2012-08-14 LAB — BASIC METABOLIC PANEL
Calcium: 9.3 mg/dL (ref 8.4–10.5)
Creatinine, Ser: 0.8 mg/dL (ref 0.4–1.2)
GFR: 75.61 mL/min (ref >60.00)
Sodium: 141 mEq/L (ref 135–145)

## 2012-08-14 LAB — LIPID PANEL
HDL: 43.7 mg/dL (ref >39.00)
Triglycerides: 105 mg/dL (ref 0.0–149.0)

## 2012-08-14 LAB — LDL CHOLESTEROL, DIRECT: Direct LDL: 158.4 mg/dL

## 2012-08-14 LAB — TSH: TSH: 2.98 u[IU]/mL (ref 0.35–5.50)

## 2012-08-21 ENCOUNTER — Encounter: Payer: Self-pay | Admitting: Internal Medicine

## 2012-08-21 ENCOUNTER — Other Ambulatory Visit (HOSPITAL_COMMUNITY)
Admission: RE | Admit: 2012-08-21 | Discharge: 2012-08-21 | Disposition: A | Discharge status: Home / Self Care | Payer: BC Managed Care – PPO | Source: Ambulatory Visit | Attending: Internal Medicine | Admitting: Internal Medicine

## 2012-08-21 ENCOUNTER — Ambulatory Visit (INDEPENDENT_AMBULATORY_CARE_PROVIDER_SITE_OTHER): Payer: BC Managed Care – PPO | Admitting: Internal Medicine

## 2012-08-21 VITALS — BP 126/76 | HR 69 | Temp 98.0°F | Ht 63.5 in | Wt 175.0 lb

## 2012-08-21 DIAGNOSIS — Z79890 Hormone replacement therapy (postmenopausal): Secondary | ICD-10-CM

## 2012-08-21 DIAGNOSIS — Z01419 Encounter for gynecological examination (general) (routine) without abnormal findings: Secondary | ICD-10-CM

## 2012-08-21 DIAGNOSIS — E669 Obesity, unspecified: Secondary | ICD-10-CM

## 2012-08-21 DIAGNOSIS — Z0000 Encounter for general adult medical examination without abnormal findings: Secondary | ICD-10-CM

## 2012-08-21 DIAGNOSIS — K219 Gastro-esophageal reflux disease without esophagitis: Secondary | ICD-10-CM

## 2012-08-21 DIAGNOSIS — Z1151 Encounter for screening for human papillomavirus (HPV): Secondary | ICD-10-CM | POA: Insufficient documentation

## 2012-08-21 DIAGNOSIS — N951 Menopausal and female climacteric states: Secondary | ICD-10-CM

## 2012-08-21 DIAGNOSIS — E039 Hypothyroidism, unspecified: Secondary | ICD-10-CM

## 2012-08-21 MED ORDER — CONJ ESTROG-MEDROXYPROGEST ACE 0.3-1.5 MG PO TABS: 1.0000 | ORAL_TABLET | Freq: Every day | ORAL | Status: DC

## 2012-08-21 NOTE — Progress Notes (Signed)
Chief Complaint  Patient presents with  . Annual Exam    HPI: Patient comes in today for Preventive Health Care visit  No major change in health status since last visit . No hosp or injury Thyroid : continue same med  HRT taking about qd to qod  May have had flushes when missed a lot  Due for pap no abn no gyne  Sx of significance Edwyna Shell to lose weight fatigue  Takes nexium  Also with help  Sx related to weight gain no dysphagia or weight loss  ROS:  GEN/ HEENT: No fever, significant weight changes sweats headaches vision problems hearing changes, CV/ PULM; No chest pain shortness of breath cough, syncope,edema  change in exercise tolerance. GI /GU: No adominal pain, vomiting, change in bowel habits. No blood in the stool. No significant GU symptoms. SKIN/HEME: ,no acute skin rashes suspicious lesions or bleeding. No lymphadenopathy, nodules, masses.  NEURO/ PSYCH:  No neurologic signs such as weakness numbness. No depression anxiety. IMM/ Allergy: No unusual infections.  Allergy .   REST of 12 system review negative except as per HPI   Past Medical History  Diagnosis Date  . GERD (gastroesophageal reflux disease)   . Hx of varicella     Family History  Problem Relation Age of Onset  . Heart disease Mother     stent in 69 s  . COPD Father   . Heart disease Father     cabg in 40s   . Glaucoma Father   . Dementia Father   . Ulcerative colitis Sister   . Ulcerative colitis Brother    Past Surgical History  Procedure Laterality Date  . Cholecystectomy    . Tubal ligation    . Exploratory laparotomy       History   Social History  . Marital Status: Married    Spouse Name: N/A    Number of Children: N/A  . Years of Education: N/A   Social History Main Topics  . Smoking status: Former Smoker    Quit date: 01/04/1982  . Smokeless tobacco: None  . Alcohol Use: Yes  . Drug Use: No  . Sexual Activity: None   Other Topics Concern  . None   Social History  Narrative   H H of 2    Married   Former tobacco   Pet 2 dog and 2 cats .   [employer]   G2P1   Orig from [place] moved over 20 years ago          Outpatient Encounter Prescriptions as of 08/21/2012  Medication Sig Dispense Refill  . esomeprazole (NEXIUM) 40 MG capsule Take 40 mg by mouth every other day.       [name] SYNTHROID 50 MCG tablet TAKE 1 TABLET DAILY  90 tablet  0  . [DISCONTINUED] estrogen, conjugated,-medroxyprogesterone (PREMPRO) 0.45-1.5 MG per tablet Take 1 tablet by mouth daily.  90 tablet  0  . estrogen, conjugated,-medroxyprogesterone (PREMPRO) 0.3-1.5 MG per tablet Take 1 tablet by mouth daily.  90 tablet  3  . [DISCONTINUED] fluticasone (FLONASE) 50 MCG/ACT nasal spray Place 2 sprays into the nose daily.  16 g  6   No facility-administered encounter medications on file as of 08/21/2012.    EXAM:  BP 126/76  Pulse 69  Temp(Src) 98 F (36.7 C) (Oral)  Ht 5' 3.5" (1.613 m)  Wt 175 lb (79.379 kg)  BMI 30.51 kg/m2  SpO2 98%  Body  mass index is 30.51 kg/(m^2).  Physical Exam: Vital signs reviewed VHQ:IONG is a well-developed well-nourished alert cooperative   female who appears her stated age in no acute distress.  HEENT: normocephalic atraumatic , Eyes: PERRL EOM's full, conjunctiva clear, Nares: paten,t no deformity discharge or tenderness., Ears: no deformity EAC's clear TMs with normal landmarks. Mouth: clear OP, no lesions, edema.  Moist mucous membranes. Dentition in adequate repair. NECK: supple without masses, thyromegaly or bruits. CHEST/PULM:  Clear to auscultation and percussion breath sounds equal no wheeze , rales or rhonchi. No chest wall deformities or tenderness. Breast: normal by inspection . No dimpling, discharge, masses, tenderness or discharge .  CV: PMI is nondisplaced, S1 S2 no gallops, murmurs, rubs. Peripheral pulses are full without delay.No JVD .  ABDOMEN: Bowel sounds normal nontender  No guard or  rebound, no hepato splenomegal no CVA tenderness.  No hernia. Extremtities:  No clubbing cyanosis or edema, no acute joint swelling or redness no focal atrophy NEURO:  Oriented x3, cranial nerves 3-12 appear to be intact, no obvious focal weakness,gait within normal limits no abnormal reflexes or asymmetrical SKIN: No acute rashes normal turgor, color, no bruising or petechiae. PSYCH: Oriented, good eye contact, no obvious depression anxiety, cognition and judgment appear normal. LN: no cervical axillary inguinal adenopathy Pelvic: NL ext GU, labia clear without lesions or rash . Vagina no lesions .Cervix: clear  UTERUS: Neg CMT Adnexa:  clear no masses . PAP done with HPV screen Rectal neg masses stool neg   Lab Results  Component Value Date   WBC 3.8* 08/14/2012   HGB 14.2 08/14/2012   HCT 41.7 08/14/2012   PLT 308.0 08/14/2012   GLUCOSE 92 08/14/2012   CHOL 210* 08/14/2012   TRIG 105.0 08/14/2012   HDL 43.70 08/14/2012   LDLDIRECT 158.4 08/14/2012   LDLCALC 122* 07/27/2011   ALT 13 08/14/2012   AST 17 08/14/2012   NA 141 08/14/2012   K 4.8 08/14/2012   CL 107 08/14/2012   CREATININE 0.8 08/14/2012   BUN 13 08/14/2012   CO2 26 08/14/2012   TSH 2.98 08/14/2012    ASSESSMENT AND PLAN:  Discussed the following assessment and plan:  Visit for preventive health examination - Plan: PAP [[place]]  Unspecified hypothyroidism  Postmenopausal HRT (hormone replacement therapy) - trial dec dose and wean   Encounter for routine gynecological examination - Plan: PAP [[place]]  Patient Care Team: Madelin Headings, MD as PCP - General Patient Instructions  Continue lifestyle intervention healthy eating and exercise . Try to get more sleep  Consider counselor for stress.  Will be notified about pap when available.  Try lower dose  HRT, for  now.   Change breakfast to protein related   Lower calorie food such as  Eggs without creamy things  [place] slices , yogurt .   Check TSH in 6 months   And OV then or as needed.   Preventive Care for Adults, Female A healthy lifestyle and preventive care can promote health and wellness. Preventive health guidelines for women include the following key practices.  A routine yearly physical is a good way to check with your caregiver about your health and preventive screening. It is a chance to share any concerns and updates on your health, and to receive a thorough exam.  Visit your dentist for a routine exam and preventive care every 6 months. Brush your teeth twice a day and floss once a day. Good oral hygiene prevents tooth  decay and gum disease.  The frequency of eye exams is based on your age, health, family medical history, use of contact lenses, and other factors. Follow your caregiver's recommendations for frequency of eye exams.  Eat a healthy diet. Foods like vegetables, fruits, whole grains, low-fat dairy products, and lean protein foods contain the nutrients you need without too many calories. Decrease your intake of foods high in solid fats, added sugars, and salt. Eat the right amount of calories for you.Get information about a proper diet from your caregiver, if necessary.  Regular physical exercise is one of the most important things you can do for your health. Most adults should get at least 150 minutes of moderate-intensity exercise (any activity that increases your heart rate and causes you to sweat) each week. In addition, most adults need muscle-strengthening exercises on 2 or more days a week.  Maintain a healthy weight. The body mass index (BMI) is a screening tool to identify possible weight problems. It provides an estimate of body fat based on height and weight. Your caregiver can help determine your BMI, and can help you achieve or maintain a healthy weight.For adults 20 years and older:  A BMI below 18.5 is considered underweight.  A BMI of 18.5 to 24.9 is normal.  A BMI of 25 to 29.9 is considered overweight.  A  BMI of 30 and above is considered obese.  Maintain normal blood lipids and cholesterol levels by exercising and minimizing your intake of saturated fat. Eat a balanced diet with plenty of fruit and vegetables. Blood tests for lipids and cholesterol should begin at age 95 and be repeated every 5 years. If your lipid or cholesterol levels are high, you are over 50, or you are at high risk for heart disease, you may need your cholesterol levels checked more frequently.Ongoing high lipid and cholesterol levels should be treated with medicines if diet and exercise are not effective.  If you smoke, find out from your caregiver how to quit. If you do not use tobacco, do not start.  If you are pregnant, do not drink alcohol. If you are breastfeeding, be very cautious about drinking alcohol. If you are not pregnant and choose to drink alcohol, do not exceed 1 drink per day. One drink is considered to be 12 ounces (355 mL) of beer, 5 ounces (148 mL) of wine, or 1.5 ounces (44 mL) of liquor.  Avoid use of street drugs. Do not share needles with anyone. Ask for help if you need support or instructions about stopping the use of drugs.  High blood pressure causes heart disease and increases the risk of stroke. Your blood pressure should be checked at least every 1 to 2 years. Ongoing high blood pressure should be treated with medicines if weight loss and exercise are not effective.  If you are 68 to 60 years old, ask your caregiver if you should take aspirin to prevent strokes.  Diabetes screening involves taking a blood sample to check your fasting blood sugar level. This should be done once every 3 years, after age 41, if you are within normal weight and without risk factors for diabetes. Testing should be considered at a younger age or be carried out more frequently if you are overweight and have at least 1 risk factor for diabetes.  Breast cancer screening is essential preventive care for women. You should  practice "breast self-awareness." This means understanding the normal appearance and feel of your breasts and may include breast self-examination.  Any changes detected, no matter how small, should be reported to a caregiver. Women in their 29s and 30s should have a clinical breast exam (CBE) by a caregiver as part of a regular health exam every 1 to 3 years. After age 15, women should have a CBE every year. Starting at age 2, women should consider having a mammography (breast X-ray test) every year. Women who have a family history of breast cancer should talk to their caregiver about genetic screening. Women at a high risk of breast cancer should talk to their caregivers about having magnetic resonance imaging (MRI) and a mammography every year.  The Pap test is a screening test for cervical cancer. A Pap test can show cell changes on the cervix that might become cervical cancer if left untreated. A Pap test is a procedure in which cells are obtained and examined from the lower end of the uterus (cervix).  Women should have a Pap test starting at age 31.  Between ages 93 and 9, Pap tests should be repeated every 2 years.  Beginning at age 57, you should have a Pap test every 3 years as long as the past 3 Pap tests have been normal.  Some women have medical problems that increase the chance of getting cervical cancer. Talk to your caregiver about these problems. It is especially important to talk to your caregiver if a new problem develops soon after your last Pap test. In these cases, your caregiver may recommend more frequent screening and Pap tests.  The above recommendations are the same for women who have or have not gotten the vaccine for human papillomavirus (HPV).  If you had a hysterectomy for a problem that was not cancer or a condition that could lead to cancer, then you no longer need Pap tests. Even if you no longer need a Pap test, a regular exam is a good idea to make sure no other  problems are starting.  If you are between ages 13 and 62, and you have had normal Pap tests going back 10 years, you no longer need Pap tests. Even if you no longer need a Pap test, a regular exam is a good idea to make sure no other problems are starting.  If you have had past treatment for cervical cancer or a condition that could lead to cancer, you need Pap tests and screening for cancer for at least 20 years after your treatment.  If Pap tests have been discontinued, risk factors (such as a new sexual partner) need to be reassessed to determine if screening should be resumed.  The HPV test is an additional test that may be used for cervical cancer screening. The HPV test looks for the virus that can cause the cell changes on the cervix. The cells collected during the Pap test can be tested for HPV. The HPV test could be used to screen women aged 67 years and older, and should be used in women of any age who have unclear Pap test results. After the age of 57, women should have HPV testing at the same frequency as a Pap test.  Colorectal cancer can be detected and often prevented. Most routine colorectal cancer screening begins at the age of 57 and continues through age 25. However, your caregiver may recommend screening at an earlier age if you have risk factors for colon cancer. On a yearly basis, your caregiver may provide home test kits to check for hidden blood in the stool. Use of  a small camera at the end of a tube, to directly examine the colon (sigmoidoscopy or colonoscopy), can detect the earliest forms of colorectal cancer. Talk to your caregiver about this at age 41, when routine screening begins. Direct examination of the colon should be repeated every 5 to 10 years through age 30, unless early forms of pre-cancerous polyps or small growths are found.  Hepatitis C blood testing is recommended for all people born from [birth year] through 1963 and any individual with known risks for hepatitis  C.  Practice safe sex. Use condoms and avoid high-risk sexual practices to reduce the spread of sexually transmitted infections (STIs). STIs include gonorrhea, chlamydia, syphilis, trichomonas, herpes, HPV, and human immunodeficiency virus (HIV). Herpes, HIV, and HPV are viral illnesses that have no cure. They can result in disability, cancer, and death. Sexually active women aged 67 and younger should be checked for chlamydia. Older women with new or multiple partners should also be tested for chlamydia. Testing for other STIs is recommended if you are sexually active and at increased risk.  Osteoporosis is a disease in which the bones lose minerals and strength with aging. This can result in serious bone fractures. The risk of osteoporosis can be identified using a bone density scan. Women ages 6 and over and women at risk for fractures or osteoporosis should discuss screening with their caregivers. Ask your caregiver whether you should take a calcium supplement or vitamin D to reduce the rate of osteoporosis.  Menopause can be associated with physical symptoms and risks. Hormone replacement therapy is available to decrease symptoms and risks. You should talk to your caregiver about whether hormone replacement therapy is right for you.  Use sunscreen with sun protection factor (SPF) of 30 or more. Apply sunscreen liberally and repeatedly throughout the day. You should seek shade when your shadow is shorter than you. Protect yourself by wearing long sleeves, pants, a wide-brimmed hat, and sunglasses year round, whenever you are outdoors.  Once a month, do a whole body skin exam, using a mirror to look at the skin on your back. Notify your caregiver of new moles, moles that have irregular borders, moles that are larger than a pencil eraser, or moles that have changed in shape or color.  Stay current with required immunizations.  Influenza. You need a dose every fall (or winter). The composition of the  flu vaccine changes each year, so being vaccinated once is not enough.  Pneumococcal polysaccharide. You need 1 to 2 doses if you smoke cigarettes or if you have certain chronic medical conditions. You need 1 dose at age 76 (or older) if you have never been vaccinated.  Tetanus, diphtheria, pertussis (Tdap, Td). Get 1 dose of Tdap vaccine if you are younger than age 35, are over 9 and have contact with an infant, are a Research scientist (physical sciences), are pregnant, or simply want to be protected from whooping cough. After that, you need a Td booster dose every 10 years. Consult your caregiver if you have not had at least 3 tetanus and diphtheria-containing shots sometime in your life or have a deep or dirty wound.  HPV. You need this vaccine if you are a woman age 43 or younger. The vaccine is given in 3 doses over 6 months.  Measles, mumps, rubella (MMR). You need at least 1 dose of MMR if you were born in 1957 or later. You may also need a second dose.  Meningococcal. If you are age 90 to 23 and a  first-year college student living in a residence hall, or have one of several medical conditions, you need to get vaccinated against meningococcal disease. You may also need additional booster doses.  Zoster (shingles). If you are age 63 or older, you should get this vaccine.  Varicella (chickenpox). If you have never had chickenpox or you were vaccinated but received only 1 dose, talk to your caregiver to find out if you need this vaccine.  Hepatitis A. You need this vaccine if you have a specific risk factor for hepatitis A virus infection or you simply wish to be protected from this disease. The vaccine is usually given as 2 doses, 6 to 18 months apart.  Hepatitis B. You need this vaccine if you have a specific risk factor for hepatitis B virus infection or you simply wish to be protected from this disease. The vaccine is given in 3 doses, usually over 6 months.     Neta Mends. Mehak Roskelley M.D. Health Maintenance   Topic Date Due  . Influenza Vaccine  09/04/2012  . Mammogram  03/09/2014  . Pap Smear  08/22/2015  . Tetanus/tdap  03/01/2019  . Colonoscopy  07/22/2019   Health Maintenance Review

## 2012-08-21 NOTE — Patient Instructions (Addendum)
Continue lifestyle intervention healthy eating and exercise . Try to get more sleep  Consider counselor for stress.  Will be notified about pap when available.  Try lower dose  HRT, for  now.   Change breakfast to protein related   Lower calorie food such as  Eggs without creamy things  [place] slices , yogurt .   Check TSH in 6 months  And OV then or as needed.   Preventive Care for Adults, Female A healthy lifestyle and preventive care can promote health and wellness. Preventive health guidelines for women include the following key practices.  A routine yearly physical is a good way to check with your caregiver about your health and preventive screening. It is a chance to share any concerns and updates on your health, and to receive a thorough exam.  Visit your dentist for a routine exam and preventive care every 6 months. Brush your teeth twice a day and floss once a day. Good oral hygiene prevents tooth decay and gum disease.  The frequency of eye exams is based on your age, health, family medical history, use of contact lenses, and other factors. Follow your caregiver's recommendations for frequency of eye exams.  Eat a healthy diet. Foods like vegetables, fruits, whole grains, low-fat dairy products, and lean protein foods contain the nutrients you need without too many calories. Decrease your intake of foods high in solid fats, added sugars, and salt. Eat the right amount of calories for you.Get information about a proper diet from your caregiver, if necessary.  Regular physical exercise is one of the most important things you can do for your health. Most adults should get at least 150 minutes of moderate-intensity exercise (any activity that increases your heart rate and causes you to sweat) each week. In addition, most adults need muscle-strengthening exercises on 2 or more days a week.  Maintain a healthy weight. The body mass index (BMI) is a screening tool to identify possible weight  problems. It provides an estimate of body fat based on height and weight. Your caregiver can help determine your BMI, and can help you achieve or maintain a healthy weight.For adults 20 years and older:  A BMI below 18.5 is considered underweight.  A BMI of 18.5 to 24.9 is normal.  A BMI of 25 to 29.9 is considered overweight.  A BMI of 30 and above is considered obese.  Maintain normal blood lipids and cholesterol levels by exercising and minimizing your intake of saturated fat. Eat a balanced diet with plenty of fruit and vegetables. Blood tests for lipids and cholesterol should begin at age 25 and be repeated every 5 years. If your lipid or cholesterol levels are high, you are over 50, or you are at high risk for heart disease, you may need your cholesterol levels checked more frequently.Ongoing high lipid and cholesterol levels should be treated with medicines if diet and exercise are not effective.  If you smoke, find out from your caregiver how to quit. If you do not use tobacco, do not start.  If you are pregnant, do not drink alcohol. If you are breastfeeding, be very cautious about drinking alcohol. If you are not pregnant and choose to drink alcohol, do not exceed 1 drink per day. One drink is considered to be 12 ounces (355 mL) of beer, 5 ounces (148 mL) of wine, or 1.5 ounces (44 mL) of liquor.  Avoid use of street drugs. Do not share needles with anyone. Ask for help if  you need support or instructions about stopping the use of drugs.  High blood pressure causes heart disease and increases the risk of stroke. Your blood pressure should be checked at least every 1 to 2 years. Ongoing high blood pressure should be treated with medicines if weight loss and exercise are not effective.  If you are 20 to 60 years old, ask your caregiver if you should take aspirin to prevent strokes.  Diabetes screening involves taking a blood sample to check your fasting blood sugar level. This should  be done once every 3 years, after age 8, if you are within normal weight and without risk factors for diabetes. Testing should be considered at a younger age or be carried out more frequently if you are overweight and have at least 1 risk factor for diabetes.  Breast cancer screening is essential preventive care for women. You should practice "breast self-awareness." This means understanding the normal appearance and feel of your breasts and may include breast self-examination. Any changes detected, no matter how small, should be reported to a caregiver. Women in their 37s and 30s should have a clinical breast exam (CBE) by a caregiver as part of a regular health exam every 1 to 3 years. After age 69, women should have a CBE every year. Starting at age 26, women should consider having a mammography (breast X-ray test) every year. Women who have a family history of breast cancer should talk to their caregiver about genetic screening. Women at a high risk of breast cancer should talk to their caregivers about having magnetic resonance imaging (MRI) and a mammography every year.  The Pap test is a screening test for cervical cancer. A Pap test can show cell changes on the cervix that might become cervical cancer if left untreated. A Pap test is a procedure in which cells are obtained and examined from the lower end of the uterus (cervix).  Women should have a Pap test starting at age 17.  Between ages 76 and 20, Pap tests should be repeated every 2 years.  Beginning at age 79, you should have a Pap test every 3 years as long as the past 3 Pap tests have been normal.  Some women have medical problems that increase the chance of getting cervical cancer. Talk to your caregiver about these problems. It is especially important to talk to your caregiver if a new problem develops soon after your last Pap test. In these cases, your caregiver may recommend more frequent screening and Pap tests.  The above  recommendations are the same for women who have or have not gotten the vaccine for human papillomavirus (HPV).  If you had a hysterectomy for a problem that was not cancer or a condition that could lead to cancer, then you no longer need Pap tests. Even if you no longer need a Pap test, a regular exam is a good idea to make sure no other problems are starting.  If you are between ages 68 and 63, and you have had normal Pap tests going back 10 years, you no longer need Pap tests. Even if you no longer need a Pap test, a regular exam is a good idea to make sure no other problems are starting.  If you have had past treatment for cervical cancer or a condition that could lead to cancer, you need Pap tests and screening for cancer for at least 20 years after your treatment.  If Pap tests have been discontinued, risk factors (such  as a new sexual partner) need to be reassessed to determine if screening should be resumed.  The HPV test is an additional test that may be used for cervical cancer screening. The HPV test looks for the virus that can cause the cell changes on the cervix. The cells collected during the Pap test can be tested for HPV. The HPV test could be used to screen women aged 39 years and older, and should be used in women of any age who have unclear Pap test results. After the age of 53, women should have HPV testing at the same frequency as a Pap test.  Colorectal cancer can be detected and often prevented. Most routine colorectal cancer screening begins at the age of 23 and continues through age 5. However, your caregiver may recommend screening at an earlier age if you have risk factors for colon cancer. On a yearly basis, your caregiver may provide home test kits to check for hidden blood in the stool. Use of a small camera at the end of a tube, to directly examine the colon (sigmoidoscopy or colonoscopy), can detect the earliest forms of colorectal cancer. Talk to your caregiver about  this at age 62, when routine screening begins. Direct examination of the colon should be repeated every 5 to 10 years through age 51, unless early forms of pre-cancerous polyps or small growths are found.  Hepatitis C blood testing is recommended for all people born from [birth year] through 1963 and any individual with known risks for hepatitis C.  Practice safe sex. Use condoms and avoid high-risk sexual practices to reduce the spread of sexually transmitted infections (STIs). STIs include gonorrhea, chlamydia, syphilis, trichomonas, herpes, HPV, and human immunodeficiency virus (HIV). Herpes, HIV, and HPV are viral illnesses that have no cure. They can result in disability, cancer, and death. Sexually active women aged 44 and younger should be checked for chlamydia. Older women with new or multiple partners should also be tested for chlamydia. Testing for other STIs is recommended if you are sexually active and at increased risk.  Osteoporosis is a disease in which the bones lose minerals and strength with aging. This can result in serious bone fractures. The risk of osteoporosis can be identified using a bone density scan. Women ages 18 and over and women at risk for fractures or osteoporosis should discuss screening with their caregivers. Ask your caregiver whether you should take a calcium supplement or vitamin D to reduce the rate of osteoporosis.  Menopause can be associated with physical symptoms and risks. Hormone replacement therapy is available to decrease symptoms and risks. You should talk to your caregiver about whether hormone replacement therapy is right for you.  Use sunscreen with sun protection factor (SPF) of 30 or more. Apply sunscreen liberally and repeatedly throughout the day. You should seek shade when your shadow is shorter than you. Protect yourself by wearing long sleeves, pants, a wide-brimmed hat, and sunglasses year round, whenever you are outdoors.  Once a month, do a whole body  skin exam, using a mirror to look at the skin on your back. Notify your caregiver of new moles, moles that have irregular borders, moles that are larger than a pencil eraser, or moles that have changed in shape or color.  Stay current with required immunizations.  Influenza. You need a dose every fall (or winter). The composition of the flu vaccine changes each year, so being vaccinated once is not enough.  Pneumococcal polysaccharide. You need 1 to 2 doses  if you smoke cigarettes or if you have certain chronic medical conditions. You need 1 dose at age 73 (or older) if you have never been vaccinated.  Tetanus, diphtheria, pertussis (Tdap, Td). Get 1 dose of Tdap vaccine if you are younger than age 40, are over 21 and have contact with an infant, are a Research scientist (physical sciences), are pregnant, or simply want to be protected from whooping cough. After that, you need a Td booster dose every 10 years. Consult your caregiver if you have not had at least 3 tetanus and diphtheria-containing shots sometime in your life or have a deep or dirty wound.  HPV. You need this vaccine if you are a woman age 17 or younger. The vaccine is given in 3 doses over 6 months.  Measles, mumps, rubella (MMR). You need at least 1 dose of MMR if you were born in 1957 or later. You may also need a second dose.  Meningococcal. If you are age 36 to 78 and a first-year college student living in a residence hall, or have one of several medical conditions, you need to get vaccinated against meningococcal disease. You may also need additional booster doses.  Zoster (shingles). If you are age 19 or older, you should get this vaccine.  Varicella (chickenpox). If you have never had chickenpox or you were vaccinated but received only 1 dose, talk to your caregiver to find out if you need this vaccine.  Hepatitis A. You need this vaccine if you have a specific risk factor for hepatitis A virus infection or you simply wish to be protected from  this disease. The vaccine is usually given as 2 doses, 6 to 18 months apart.  Hepatitis B. You need this vaccine if you have a specific risk factor for hepatitis B virus infection or you simply wish to be protected from this disease. The vaccine is given in 3 doses, usually over 6 months.

## 2012-08-22 NOTE — Progress Notes (Signed)
Quick Note:  Tell patient PAP is normal. And neg HPV Next pap in 5 years or as needed ______

## 2012-08-23 ENCOUNTER — Encounter: Payer: Self-pay | Admitting: Family Medicine

## 2012-08-24 ENCOUNTER — Encounter: Payer: Self-pay | Admitting: Internal Medicine

## 2012-08-24 DIAGNOSIS — E669 Obesity, unspecified: Secondary | ICD-10-CM | POA: Insufficient documentation

## 2012-10-17 ENCOUNTER — Other Ambulatory Visit: Payer: Self-pay | Admitting: Internal Medicine

## 2012-11-09 ENCOUNTER — Other Ambulatory Visit: Payer: Self-pay

## 2012-11-10 ENCOUNTER — Encounter: Payer: Self-pay | Admitting: Family Medicine

## 2012-11-10 ENCOUNTER — Ambulatory Visit (INDEPENDENT_AMBULATORY_CARE_PROVIDER_SITE_OTHER): Payer: BC Managed Care – PPO | Admitting: Family Medicine

## 2012-11-10 VITALS — BP 146/89 | HR 69 | Temp 98.4°F | Resp 16 | Ht 64.0 in | Wt 166.0 lb

## 2012-11-10 DIAGNOSIS — H6980 Other specified disorders of Eustachian tube, unspecified ear: Secondary | ICD-10-CM | POA: Insufficient documentation

## 2012-11-10 DIAGNOSIS — H8110 Benign paroxysmal vertigo, unspecified ear: Secondary | ICD-10-CM

## 2012-11-10 DIAGNOSIS — H6982 Other specified disorders of Eustachian tube, left ear: Secondary | ICD-10-CM

## 2012-11-10 NOTE — Progress Notes (Signed)
OFFICE NOTE  11/10/2012  CC:  Chief Complaint  Patient presents with  . Dizziness  . Ear Fullness    Left ear      HPI: Patient is a 62 y.o. Caucasian female who is here for left ear fullness x 1 mo. Also, has feeling of spinning sensation/dizziness with different head movements, with mild nausea at times, lasts 1-2 seconds most of the time but occ has been "about 1/2 a minute" before she felt like she was back to baseline.  Denies feeling of hearing loss.  No ringing in ears. Had something similar years ago. She feels constant PND.  Also says her left ear has always had problems with feeling full or stuffy intermittently, worse after airplane flights.  She did fly in June/July.  No persistent HA's (last HA was 2 wks ago--ibuprofen).  Pertinent PMH:  Past Medical History  Diagnosis Date  . GERD (gastroesophageal reflux disease)   . Hx of varicella   . Shingles 10/26/2010   Past Surgical History  Procedure Laterality Date  . Cholecystectomy    . Tubal ligation    . Exploratory laparotomy      MEDS:  Outpatient Prescriptions Prior to Visit  Medication Sig Dispense Refill  . esomeprazole (NEXIUM) 40 MG capsule Take 40 mg by mouth every other day.       . estrogen, conjugated,-medroxyprogesterone (PREMPRO) 0.3-1.5 MG per tablet Take 1 tablet by mouth daily.  90 tablet  3  . SYNTHROID 50 MCG tablet TAKE 1 TABLET DAILY  90 tablet  2   No facility-administered medications prior to visit.    PE: Blood pressure 146/89, pulse 69, temperature 98.4 F (36.9 C), temperature source Temporal, resp. rate 16, height 5\' 4"  (1.626 m), weight 166 lb (75.297 kg), SpO2 98.00%. Gen: Alert, well appearing.  Patient is oriented to person, place, time, and situation. ENT: Ears: EACs clear, normal epithelium.  TMs with good light reflex and landmarks bilaterally.  Eyes: no injection, icteris, swelling, or exudate.  EOMI, PERRLA. Nose: no drainage or turbinate edema/swelling.  No injection or  focal lesion.  Mouth: lips without lesion/swelling.  Oral mucosa pink and moist.  Dentition intact and without obvious caries or gingival swelling.  Oropharynx without erythema, exudate, or swelling.  Neck - No masses or thyromegaly or limitation in range of motion CV: RRR, no m/r/g.   LUNGS: CTA bilat, nonlabored resps, good aeration in all lung fields. Neuro: no tremor or ataxia.  No pronator drift.  No focal weakness.  FNF normal bilat. Dix-Halpike maneuver positive on left: induced vertigo, nausea, and rotary nystagmus that lasted approx 15-20 seconds.  No recurrence of the sx's upon sitting upright but felt mild disequilibrium for about 1 min after sitting up.  IMPRESSION AND PLAN:  BPPV (benign paroxysmal positional vertigo) Discussed/reviewed Epley's maneuvers to do at home 3 times per day until she has had no vertigo episodes for 24h.   Eustachian tube dysfunction Reassured pt that her ear discomfort was not related to her vertigo problem. Discussed possibly getting back on a nasal steroid: she said she'll consider restarting nasacort OTC and she'll continue with neti pot treatments qd.  An After Visit Summary was printed and given to the patient.  FOLLOW UP: prn

## 2012-11-10 NOTE — Assessment & Plan Note (Signed)
Reassured pt that her ear discomfort was not related to her vertigo problem. Discussed possibly getting back on a nasal steroid: she said she'll consider restarting nasacort OTC and she'll continue with neti pot treatments qd.

## 2012-11-10 NOTE — Assessment & Plan Note (Signed)
Discussed/reviewed Epley's maneuvers to do at home 3 times per day until she has had no vertigo episodes for 24h.

## 2013-02-14 ENCOUNTER — Other Ambulatory Visit (INDEPENDENT_AMBULATORY_CARE_PROVIDER_SITE_OTHER): Payer: BC Managed Care – PPO

## 2013-02-14 DIAGNOSIS — E039 Hypothyroidism, unspecified: Secondary | ICD-10-CM

## 2013-02-14 LAB — TSH: TSH: 1.28 u[IU]/mL (ref 0.35–5.50)

## 2013-02-21 ENCOUNTER — Ambulatory Visit (INDEPENDENT_AMBULATORY_CARE_PROVIDER_SITE_OTHER): Payer: BC Managed Care – PPO | Admitting: Internal Medicine

## 2013-02-21 ENCOUNTER — Encounter: Payer: Self-pay | Admitting: Internal Medicine

## 2013-02-21 VITALS — BP 128/80 | HR 72 | Temp 97.7°F | Ht 63.5 in | Wt 170.0 lb

## 2013-02-21 DIAGNOSIS — N951 Menopausal and female climacteric states: Secondary | ICD-10-CM

## 2013-02-21 DIAGNOSIS — Z634 Disappearance and death of family member: Secondary | ICD-10-CM

## 2013-02-21 DIAGNOSIS — Z79890 Hormone replacement therapy (postmenopausal): Secondary | ICD-10-CM

## 2013-02-21 DIAGNOSIS — E039 Hypothyroidism, unspecified: Secondary | ICD-10-CM

## 2013-02-21 NOTE — Progress Notes (Signed)
Chief Complaint  Patient presents with  . Follow-up    HPI: Patient comes in today for follow up of  multiple medical problems.   HRT  To try  every other  Day to see how it doew seems to be doing onk on lower dose  Thyroid; change in meds  Bereavement fatgher just [assed away and funeral.coping adequately  Bp had been good  Weight better gained some back with fathers passing ROS: See pertinent positives and negatives per HPI.no cov pulm new  Past Medical History  Diagnosis Date  . GERD (gastroesophageal reflux disease)   . Hx of varicella   . Shingles 10/26/2010    Family History  Problem Relation Age of Onset  . Heart disease Mother     stent in 23 s  . COPD Father   . Heart disease Father     cabg in 57s   . Glaucoma Father   . Dementia Father   . Ulcerative colitis Sister   . Ulcerative colitis Brother     History   Social History  . Marital Status: Married    Spouse Name: N/A    Number of Children: N/A  . Years of Education: N/A   Social History Main Topics  . Smoking status: Former Smoker    Quit date: 01/04/1982  . Smokeless tobacco: None  . Alcohol Use: Yes  . Drug Use: No  . Sexual Activity: None   Other Topics Concern  . None   Social History Narrative   H H of 2    Married   Former tobacco   Pet 2 dog and 2 cats .   [place] [employer] and [employer]   G2P1   Orig from [place] moved over 20 years ago          Outpatient Encounter Prescriptions as of 02/21/2013  Medication Sig  . esomeprazole (NEXIUM) 40 MG capsule Take 40 mg by mouth every other day.   . estrogen, conjugated,-medroxyprogesterone (PREMPRO) 0.3-1.5 MG per tablet Take 1 tablet by mouth daily.  [name] SYNTHROID 50 MCG tablet TAKE 1 TABLET DAILY    EXAM:  BP 128/80  Pulse 72  Temp(Src) 97.7 F (36.5 C) (Temporal)  Ht 5' 3.5" (1.613 m)  Wt 170 lb (77.111 kg)  BMI 29.64 kg/m2  SpO2 98%  Body mass index is 29.64 kg/(m^2).  GENERAL: vitals reviewed and  listed above, alert, oriented, appears well hydrated and in no acute distress  HEENT: atraumatic, conjunctiva  clear, no obvious abnormalities on inspection of external nose and ears  NECK: no obvious masses on inspection palpation CV: HRRR, no clubbing cyanosis or  peripheral edema nl cap refill  MS: moves all extremities without noticeable focal  abnormality PSYCH: pleasant and cooperative, no obvious depression or anxiety Lab Results  Component Value Date   WBC 3.8* 08/14/2012   HGB 14.2 08/14/2012   HCT 41.7 08/14/2012   PLT 308.0 08/14/2012   GLUCOSE 92 08/14/2012   CHOL 210* 08/14/2012   TRIG 105.0 08/14/2012   HDL 43.70 08/14/2012   LDLDIRECT 158.4 08/14/2012   LDLCALC 122* 07/27/2011   ALT 13 08/14/2012   AST 17 08/14/2012   NA 141 08/14/2012   K 4.8 08/14/2012   CL 107 08/14/2012   CREATININE 0.8 08/14/2012   BUN 13 08/14/2012   CO2 26 08/14/2012   TSH 1.28 02/14/2013    ASSESSMENT AND PLAN:  Discussed the following assessment and plan:  Unspecified hypothyroidism -  inrange   Postmenopausal HRT (hormone replacement therapy) - agree with weaning dose   Perimenopausal vasomotor symptoms  Uncomplicated bereavement Wt Readings from Last 3 Encounters:  02/21/13 170 lb (77.111 kg)  11/10/12 166 lb (75.297 kg)  08/21/12 175 lb (79.379 kg)    -Patient advised to return or notify health care team  if symptoms worsen or persist or new concerns arise.  Patient Instructions  Continue lifestyle intervention healthy eating and exercise . Same thyroid dose . Lowest dose for hrt as you are doing.  Continue yoga.   CPX with labs in 6 months      Standley Brooking. Panosh M.D.  Pre visit review using our clinic review tool, if applicable. No additional management support is needed unless otherwise documented below in the visit note.

## 2013-02-21 NOTE — Patient Instructions (Signed)
Continue lifestyle intervention healthy eating and exercise . Same thyroid dose . Lowest dose for hrt as you are doing.  Continue yoga.   CPX with labs in 6 months

## 2013-02-22 DIAGNOSIS — Z634 Disappearance and death of family member: Secondary | ICD-10-CM | POA: Insufficient documentation

## 2013-05-01 ENCOUNTER — Other Ambulatory Visit: Payer: Self-pay

## 2013-05-01 DIAGNOSIS — Z1231 Encounter for screening mammogram for malignant neoplasm of breast: Secondary | ICD-10-CM

## 2013-06-20 ENCOUNTER — Ambulatory Visit
Admission: RE | Admit: 2013-06-20 | Discharge: 2013-06-20 | Disposition: A | Discharge status: Home / Self Care | Payer: BC Managed Care – PPO | Source: Ambulatory Visit

## 2013-06-20 DIAGNOSIS — Z1231 Encounter for screening mammogram for malignant neoplasm of breast: Secondary | ICD-10-CM

## 2013-07-20 ENCOUNTER — Other Ambulatory Visit: Payer: Self-pay | Admitting: Internal Medicine

## 2013-07-20 ENCOUNTER — Telehealth: Payer: Self-pay | Admitting: Internal Medicine

## 2013-07-20 MED ORDER — LEVOTHYROXINE SODIUM 50 MCG PO TABS: ORAL_TABLET | ORAL | Status: DC

## 2013-07-20 NOTE — Telephone Encounter (Signed)
Pt needs generic synthroid 0.05 mcg #90 w.refills sent to [facility]

## 2013-07-20 NOTE — Telephone Encounter (Signed)
Sent to the pharmacy by e-scribe. 

## 2013-08-23 ENCOUNTER — Encounter: Payer: Self-pay | Admitting: Gastroenterology

## 2013-11-16 ENCOUNTER — Other Ambulatory Visit: Payer: Self-pay | Admitting: Family Medicine

## 2013-11-16 ENCOUNTER — Encounter: Payer: Self-pay | Admitting: Internal Medicine

## 2013-11-16 ENCOUNTER — Ambulatory Visit (INDEPENDENT_AMBULATORY_CARE_PROVIDER_SITE_OTHER): Payer: BC Managed Care – PPO | Admitting: Internal Medicine

## 2013-11-16 ENCOUNTER — Ambulatory Visit (INDEPENDENT_AMBULATORY_CARE_PROVIDER_SITE_OTHER)
Admission: RE | Admit: 2013-11-16 | Discharge: 2013-11-16 | Disposition: A | Discharge status: Home / Self Care | Payer: BC Managed Care – PPO | Source: Ambulatory Visit | Attending: Internal Medicine | Admitting: Internal Medicine

## 2013-11-16 VITALS — BP 164/80 | HR 100 | Temp 102.6°F | Ht 63.5 in | Wt 171.3 lb

## 2013-11-16 DIAGNOSIS — J189 Pneumonia, unspecified organism: Secondary | ICD-10-CM | POA: Diagnosis not present

## 2013-11-16 DIAGNOSIS — J181 Lobar pneumonia, unspecified organism: Secondary | ICD-10-CM

## 2013-11-16 DIAGNOSIS — R6889 Other general symptoms and signs: Secondary | ICD-10-CM

## 2013-11-16 DIAGNOSIS — R030 Elevated blood-pressure reading, without diagnosis of hypertension: Secondary | ICD-10-CM | POA: Diagnosis not present

## 2013-11-16 DIAGNOSIS — R509 Fever, unspecified: Secondary | ICD-10-CM

## 2013-11-16 DIAGNOSIS — R918 Other nonspecific abnormal finding of lung field: Secondary | ICD-10-CM

## 2013-11-16 DIAGNOSIS — J989 Respiratory disorder, unspecified: Secondary | ICD-10-CM | POA: Diagnosis not present

## 2013-11-16 DIAGNOSIS — Z01812 Encounter for preprocedural laboratory examination: Secondary | ICD-10-CM

## 2013-11-16 LAB — POCT INFLUENZA A/B
Influenza A, POC: NEGATIVE
Influenza B, POC: NEGATIVE

## 2013-11-16 MED ORDER — ESOMEPRAZOLE MAGNESIUM 40 MG PO CPDR: 40.0000 mg | DELAYED_RELEASE_CAPSULE | ORAL | Status: DC

## 2013-11-16 MED ORDER — LEVOFLOXACIN 750 MG PO TABS: 750.0000 mg | ORAL_TABLET | Freq: Every day | ORAL | Status: DC

## 2013-11-16 NOTE — Patient Instructions (Addendum)
This acts like flu . Even though tests is negative .  Get chest x ray to check for pneumonia . If ok begin tamiflu   As directed  Fluids rest  Take tylenol or 400 ibuprofen for fever .  Expect fever to be gone in the next 48 hours   Recheck or call if needed oncall service if needed.   Influenza Influenza ("the flu") is a viral infection of the respiratory tract. It occurs more often in winter months because people spend more time in close contact with one another. Influenza can make you feel very sick. Influenza easily spreads from person to person (contagious). CAUSES  Influenza is caused by a virus that infects the respiratory tract. You can catch the virus by breathing in droplets from an infected person's cough or sneeze. You can also catch the virus by touching something that was recently contaminated with the virus and then touching your mouth, nose, or eyes. RISKS AND COMPLICATIONS You may be at risk for a more severe case of influenza if you smoke cigarettes, have diabetes, have chronic heart disease (such as heart failure) or lung disease (such as asthma), or if you have a weakened immune system. Elderly people and pregnant women are also at risk for more serious infections. The most common problem of influenza is a lung infection (pneumonia). Sometimes, this problem can require emergency medical care and may be life threatening. SIGNS AND SYMPTOMS  Symptoms typically last 4 to 10 days and may include:  Fever.  Chills.  Headache, body aches, and muscle aches.  Sore throat.  Chest discomfort and cough.  Poor appetite.  Weakness or feeling tired.  Dizziness.  Nausea or vomiting. DIAGNOSIS  Diagnosis of influenza is often made based on your history and a physical exam. A nose or throat swab test can be done to confirm the diagnosis. TREATMENT  In mild cases, influenza goes away on its own. Treatment is directed at relieving symptoms. For more severe cases, your health  care provider may prescribe antiviral medicines to shorten the sickness. Antibiotic medicines are not effective because the infection is caused by a virus, not by bacteria. HOME CARE INSTRUCTIONS  Take medicines only as directed by your health care provider.  Use a cool mist humidifier to make breathing easier.  Get plenty of rest until your temperature returns to normal. This usually takes 3 to 4 days.  Drink enough fluid to keep your urine clear or pale yellow.  Cover yourmouth and nosewhen coughing or sneezing,and wash your handswellto prevent thevirusfrom spreading.  Stay homefromwork orschool untilthe fever is gonefor at least 31full day. PREVENTION  An annual influenza vaccination (flu shot) is the best way to avoid getting influenza. An annual flu shot is now routinely recommended for all adults in the [place] IF:  You experiencechest pain, yourcough worsens,or you producemore mucus.  Youhave nausea,vomiting, ordiarrhea.  Your fever returns or gets worse. SEEK IMMEDIATE MEDICAL CARE IF:  You havetrouble breathing, you become short of breath,or your skin ornails becomebluish.  You have severe painor stiffnessin the neck.  You develop a sudden headache, or pain in the face or ear.  You have nausea or vomiting that you cannot control. MAKE SURE YOU:   Understand these instructions.  Will watch your condition.  Will get help right away if you are not doing well or get worse. Document Released: 12/19/1999 Document Revised: 05/07/2013 Document Reviewed: 03/22/2011 [facility] Patient Information 2015 [place], [place]. This information is not intended  to replace advice given to you by your health care provider. Make sure you discuss any questions you have with your health care provider.

## 2013-11-16 NOTE — Progress Notes (Signed)
Pre visit review using our clinic review tool, if applicable. No additional management support is needed unless otherwise documented below in the visit note.  Chief Complaint  Patient presents with  . Fever  . Generalized Body Aches  . Cough  . Headache  . Chills  . Nausea    X3DAYS  . Shortness of Breath    HPI: Patient Jillian Escobar  comes in today for SDA for  new problem evaluation. Onset  3 days ago chills and cough achiness and hoarse and headache  Not taking anything  Because concern about  Stomach and not hungry talking plenty of fluids    HA chills tight chest getting looser  Hoarse. Went to work today ( school ) but had to leave  Last travel from [place]  Works in schools  Many out ? Dx  Husband well.  ROS: See pertinent positives and negatives per HPI. No vomiting diarrhea neuro sx rflank soreness with hx of same  No  Hemoptysis  lef swelling  No flu vaccine cause of egg allergy. Past Medical History  Diagnosis Date  . GERD (gastroesophageal reflux disease)   . Hx of varicella   . Shingles 10/26/2010    Family History  Problem Relation Age of Onset  . Heart disease Mother     stent in 73 s  . COPD Father   . Heart disease Father     cabg in 72s   . Glaucoma Father   . Dementia Father   . Ulcerative colitis Sister   . Ulcerative colitis Brother     History   Social History  . Marital Status: Married    Spouse Name: N/A    Number of Children: N/A  . Years of Education: N/A   Social History Main Topics  . Smoking status: Former Smoker    Quit date: 01/04/1982  . Smokeless tobacco: None  . Alcohol Use: Yes  . Drug Use: No  . Sexual Activity: None   Other Topics Concern  . None   Social History Narrative   H H of 2    Married   Former tobacco   Pet 2 dog and 2 cats .   [employer] pre [employer] and [employer]   G2P1   Orig from [place] moved over 20 years ago          Outpatient Encounter Prescriptions as of 11/16/2013    Medication Sig  . esomeprazole (NEXIUM) 40 MG capsule Take 1 capsule (40 mg total) by mouth every other day.  . estrogen, conjugated,-medroxyprogesterone (PREMPRO) 0.3-1.5 MG per tablet Take 1 tablet by mouth daily.  [name] levothyroxine (SYNTHROID) 50 MCG tablet TAKE 1 TABLET DAILY  . [DISCONTINUED] esomeprazole (NEXIUM) 40 MG capsule Take 40 mg by mouth every other day.   . [DISCONTINUED] esomeprazole (NEXIUM) 40 MG capsule Take 1 capsule (40 mg total) by mouth every other day.    EXAM:  BP 164/80 mmHg  Pulse 100  Temp(Src) 102.6 F (39.2 C) (Oral)  Ht 5' 3.5" (1.613 m)  Wt 171 lb 4.8 oz (77.701 kg)  BMI 29.86 kg/m2  SpO2 96%  Body mass index is 29.86 kg/(m^2). here with husband  WDWN in NAD  quiet respirations; congested  somewhat hoarse. Non toxic .but sick  Want to lay down.  HEENT: Normocephalic ;atraumatic , Eyes;  PERRL, EOMs  Full, lids and conjunctiva clear,,Ears: no deformities, canals nl, TM landmarks normal, Nose: no deformity or discharge but congested;face nontender  Mouth : OP clear without lesion or edema . Mild erythema  Neck: Supple without adenopathy or masses or bruits Chest:  Clear to A&P without wheezes rales or rhonchi  initally exp wheeze  rhon lower Then cleared  with cough  bs ?=  CV:  S1-S2 no gallops or murmurs peripheral perfusion is normal Skin :nl perfusion and no acute rashes  Abdomen:  Sof,t normal bowel sounds without hepatosplenomegaly, no guarding rebound or masses no CVA tenderness Skin: normal capillary refill ,turgor , color: No acute rashes ,petechiae or bruising  ASSESSMENT AND PLAN:  Discussed the following assessment and plan:  Respiratory illness with fever - Plan: DG Chest 2 View  Right upper lobe pneumonia vs mass on x ray - empiric rx and ct as advised  small pulm nodules  Flu-like symptoms - Plan: POCT Influenza A/B, DG Chest 2 View  Chest x-ray shows right upper lobe pneumonia air space disease versus mass. Radiologist suggest CT  scan there are also a couple small pulmonary nodules on the right. Plan is to use Levaquin 750 daily for 7 days for pneumonia and CBC BMP chest CT when possible plan follow-up in a week earlier if fever is not gone or worsening her clinical picture is more of pneumonia than of other complicated illness but needs close follow-up. -Patient advised to return or notify health care team  if symptoms worsen ,persist or new concerns arise.  Patient Instructions  This acts like flu . Even though tests is negative .  Get chest x ray to check for pneumonia . If ok begin tamiflu   As directed  Fluids rest  Take tylenol or 400 ibuprofen for fever .  Expect fever to be gone in the next 48 hours   Recheck or call if needed oncall service if needed.   Influenza Influenza ("the flu") is a viral infection of the respiratory tract. It occurs more often in winter months because people spend more time in close contact with one another. Influenza can make you feel very sick. Influenza easily spreads from person to person (contagious). CAUSES  Influenza is caused by a virus that infects the respiratory tract. You can catch the virus by breathing in droplets from an infected person's cough or sneeze. You can also catch the virus by touching something that was recently contaminated with the virus and then touching your mouth, nose, or eyes. RISKS AND COMPLICATIONS You may be at risk for a more severe case of influenza if you smoke cigarettes, have diabetes, have chronic heart disease (such as heart failure) or lung disease (such as asthma), or if you have a weakened immune system. Elderly people and pregnant women are also at risk for more serious infections. The most common problem of influenza is a lung infection (pneumonia). Sometimes, this problem can require emergency medical care and may be life threatening. SIGNS AND SYMPTOMS  Symptoms typically last 4 to 10 days and may  include:  Fever.  Chills.  Headache, body aches, and muscle aches.  Sore throat.  Chest discomfort and cough.  Poor appetite.  Weakness or feeling tired.  Dizziness.  Nausea or vomiting. DIAGNOSIS  Diagnosis of influenza is often made based on your history and a physical exam. A nose or throat swab test can be done to confirm the diagnosis. TREATMENT  In mild cases, influenza goes away on its own. Treatment is directed at relieving symptoms. For more severe cases, your health care provider may prescribe antiviral medicines to shorten the  sickness. Antibiotic medicines are not effective because the infection is caused by a virus, not by bacteria. HOME CARE INSTRUCTIONS  Take medicines only as directed by your health care provider.  Use a cool mist humidifier to make breathing easier.  Get plenty of rest until your temperature returns to normal. This usually takes 3 to 4 days.  Drink enough fluid to keep your urine clear or pale yellow.  Cover yourmouth and nosewhen coughing or sneezing,and wash your handswellto prevent thevirusfrom spreading.  Stay homefromwork orschool untilthe fever is gonefor at least 86full day. PREVENTION  An annual influenza vaccination (flu shot) is the best way to avoid getting influenza. An annual flu shot is now routinely recommended for all adults in the [place] IF:  You experiencechest pain, yourcough worsens,or you producemore mucus.  Youhave nausea,vomiting, ordiarrhea.  Your fever returns or gets worse. SEEK IMMEDIATE MEDICAL CARE IF:  You havetrouble breathing, you become short of breath,or your skin ornails becomebluish.  You have severe painor stiffnessin the neck.  You develop a sudden headache, or pain in the face or ear.  You have nausea or vomiting that you cannot control. MAKE SURE YOU:   Understand these instructions.  Will watch your condition.  Will get help right away if you  are not doing well or get worse. Document Released: 12/19/1999 Document Revised: 05/07/2013 Document Reviewed: 03/22/2011 [facility] Patient Information 2015 [place], [place]. This information is not intended to replace advice given to you by your health care provider. Make sure you discuss any questions you have with your health care provider.     Standley Brooking. Nixie Laube M.D.

## 2013-11-17 ENCOUNTER — Encounter: Payer: Self-pay | Admitting: Internal Medicine

## 2013-11-19 ENCOUNTER — Other Ambulatory Visit: Payer: Self-pay | Admitting: Family Medicine

## 2013-11-19 ENCOUNTER — Ambulatory Visit (INDEPENDENT_AMBULATORY_CARE_PROVIDER_SITE_OTHER)
Admission: RE | Admit: 2013-11-19 | Discharge: 2013-11-19 | Disposition: A | Discharge status: Home / Self Care | Payer: BC Managed Care – PPO | Source: Ambulatory Visit | Attending: Internal Medicine | Admitting: Internal Medicine

## 2013-11-19 ENCOUNTER — Other Ambulatory Visit: Payer: Self-pay | Admitting: Internal Medicine

## 2013-11-19 ENCOUNTER — Other Ambulatory Visit (INDEPENDENT_AMBULATORY_CARE_PROVIDER_SITE_OTHER): Payer: BC Managed Care – PPO

## 2013-11-19 DIAGNOSIS — Z01812 Encounter for preprocedural laboratory examination: Secondary | ICD-10-CM

## 2013-11-19 DIAGNOSIS — J189 Pneumonia, unspecified organism: Secondary | ICD-10-CM

## 2013-11-19 DIAGNOSIS — R918 Other nonspecific abnormal finding of lung field: Secondary | ICD-10-CM

## 2013-11-19 LAB — BASIC METABOLIC PANEL
BUN: 16 mg/dL (ref 6–23)
CO2: 23 mEq/L (ref 19–32)
Calcium: 9.3 mg/dL (ref 8.4–10.5)
Chloride: 103 mEq/L (ref 96–112)
Creatinine, Ser: 0.8 mg/dL (ref 0.4–1.2)
GFR: 82.19 mL/min (ref >60.00)
Glucose, Bld: 103 mg/dL — ABNORMAL HIGH (ref 70–99)
POTASSIUM: 4.2 meq/L (ref 3.5–5.1)
Sodium: 140 mEq/L (ref 135–145)

## 2013-11-19 LAB — CBC WITH DIFFERENTIAL/PLATELET
Basophils Absolute: 0 10*3/uL (ref 0.0–0.1)
Basophils Relative: 0.7 % (ref 0.0–3.0)
Eosinophils Absolute: 0.1 10*3/uL (ref 0.0–0.7)
Eosinophils Relative: 1.8 % (ref 0.0–5.0)
HEMATOCRIT: 41.6 % (ref 36.0–46.0)
Hemoglobin: 14 g/dL (ref 12.0–15.0)
Lymphocytes Relative: 30.2 % (ref 12.0–46.0)
Lymphs Abs: 1.1 10*3/uL (ref 0.7–4.0)
MCHC: 33.5 g/dL (ref 30.0–36.0)
MCV: 89.5 fl (ref 78.0–100.0)
MONOS PCT: 16.7 % — AB (ref 3.0–12.0)
Monocytes Absolute: 0.6 10*3/uL (ref 0.1–1.0)
Neutro Abs: 1.9 10*3/uL (ref 1.4–7.7)
Neutrophils Relative %: 50.6 % (ref 43.0–77.0)
PLATELETS: 300 10*3/uL (ref 150.0–400.0)
RBC: 4.65 Mil/uL (ref 3.87–5.11)
RDW: 12.6 % (ref 11.5–15.5)
WBC: 3.8 10*3/uL — AB (ref 4.0–10.5)

## 2013-11-19 MED ORDER — IOHEXOL 300 MG/ML  SOLN
80.0000 mL | Freq: Once | INTRAMUSCULAR | Status: AC | PRN
  Administered 2013-11-19: 80 mL via INTRAVENOUS

## 2013-11-23 ENCOUNTER — Encounter: Payer: Self-pay | Admitting: Internal Medicine

## 2013-11-23 ENCOUNTER — Ambulatory Visit (INDEPENDENT_AMBULATORY_CARE_PROVIDER_SITE_OTHER): Payer: BC Managed Care – PPO | Admitting: Internal Medicine

## 2013-11-23 VITALS — BP 128/78 | Temp 98.0°F | Ht 63.5 in | Wt 163.2 lb

## 2013-11-23 DIAGNOSIS — J181 Lobar pneumonia, unspecified organism: Principal | ICD-10-CM

## 2013-11-23 DIAGNOSIS — J189 Pneumonia, unspecified organism: Secondary | ICD-10-CM

## 2013-11-23 DIAGNOSIS — K760 Fatty (change of) liver, not elsewhere classified: Secondary | ICD-10-CM

## 2013-11-23 NOTE — Progress Notes (Signed)
`  Pre visit review using our clinic review tool, if applicable. No additional management support is needed unless otherwise documented below in the visit note.   Chief Complaint  Patient presents with  . Follow-up    PNA    HPI: Jillian Escobar  Comes in for follow-up 1 week from right upper lobe pneumonia. Placed on Levaquin for 1 week. Had a CT scan to rule out obstructive lesion. Showed no obstruction mild parapneumonic effusion mild fatty liver Fever gone in 2   DAYS   Still very tired and exhausted   Cough comes and goes.  Not that bad Diarrhea antibiotic  Not that bad .  Getting in fluid  .  Appetite inc this am . Ensure and pediatlyte a lot better than 5 days ago. That she would be a lot better on the fatigue. ROS: See pertinent positives and negatives per HPI. No chest pain shortness of breath otherwise unusual rashes bleeding.  Past Medical History  Diagnosis Date  . GERD (gastroesophageal reflux disease)   . Hx of varicella   . Shingles 10/26/2010    Family History  Problem Relation Age of Onset  . Heart disease Mother     stent in 4 s  . COPD Father   . Heart disease Father     cabg in 64s   . Glaucoma Father   . Dementia Father   . Ulcerative colitis Sister   . Ulcerative colitis Brother     History   Social History  . Marital Status: Married    Spouse Name: N/A    Number of Children: N/A  . Years of Education: N/A   Social History Main Topics  . Smoking status: Former Smoker    Quit date: 01/04/1982  . Smokeless tobacco: None  . Alcohol Use: Yes  . Drug Use: No  . Sexual Activity: None   Other Topics Concern  . None   Social History Narrative   H H of 2    Married   Former tobacco   Pet 2 dog and 2 cats .   [place] [employer]   G2P1   Orig from [place] moved over 20 years ago          Outpatient Encounter Prescriptions as of 11/23/2013  Medication Sig  . esomeprazole (NEXIUM) 40 MG capsule Take 1  capsule (40 mg total) by mouth every other day.  . estrogen, conjugated,-medroxyprogesterone (PREMPRO) 0.3-1.5 MG per tablet Take 1 tablet by mouth daily.  [name] levothyroxine (SYNTHROID) 50 MCG tablet TAKE 1 TABLET DAILY  . [DISCONTINUED] levofloxacin (LEVAQUIN) 750 MG tablet Take 1 tablet (750 mg total) by mouth daily.    EXAM:  BP 128/78 mmHg  Temp(Src) 98 F (36.7 C) (Oral)  Ht 5' 3.5" (1.613 m)  Wt 163 lb 3.2 oz (74.027 kg)  BMI 28.45 kg/m2  Body mass index is 28.45 kg/(m^2).  GENERAL: vitals reviewed and listed above, alert, oriented, appears well hydrated and in no acute distress tired . Nontoxic HEENT: atraumatic, conjunctiva  clear, no obvious abnormalities on inspection of external nose and ears OP : no lesion edema or exudate  NECK: no obvious masses on inspection palpation  LUNGS: clear to auscultation bilaterally, no wheezes, rales or rhonchi, good air movement I don't hear consolidation on her chest exam today CV: HRRR, no clubbing cyanosis or  peripheral edema nl cap refill  MS: moves all extremities without noticeable focal  abnormality PSYCH: pleasant  and cooperative, no obvious depression or anxiety  ASSESSMENT AND PLAN:  Discussed the following assessment and plan:  Right upper lobe pneumonia  - sig improved  follow  see plan cxray 4 weeks or so - Plan: DG Chest 2 View  Hepatic steatosis mild - incidental find on ct  Much improved reviewed ct scan  Etc fatty liver etc no obstructive lesion Looks much better today .    Expectant management. Advise fu  cxray in 4-6 weeks rx  And rov if not continuing to improve  -Patient advised to return or notify health care team  if symptoms worsen ,persist or new concerns arise.  Patient Instructions  Continued rest  Activity as tolerated  You may be tired for a few more weeks but progress as expected.  Contact us if fever returns or relapse.  Recheck c xray week of dec 14 or thereabouts and fu visit if not feeling back  close to baseline.  When better return to heart healthy exercise and diet . To prevent   Heart disease . Etc.    Standley Brooking. Panosh M.D. PM    EXAM: CT CHEST WITH CONTRAST  TECHNIQUE: Multidetector CT imaging of the chest was performed during intravenous contrast administration.  CONTRAST: 85mL OMNIPAQUE IOHEXOL 300 MG/ML IV.  COMPARISON: None.  FINDINGS: Airspace consolidation with air bronchograms medially in the right upper lobe, with patchy airspace opacities elsewhere throughout the right upper lobe no obstructing central mass is identified. The non-opacified azygos vein mimics a mass on the axial images. No parenchymal airspace opacities elsewhere in either lung. Associated small right pleural effusion. No left pleural effusion. Central airways patent without significant bronchial wall thickening.  Normal sized mediastinal lymph nodes, the largest a right upper paratracheal node (station 2R) measuring 10 mm short axis. No significant mediastinal, hilar or axillary lymphadenopathy. Thyroid gland mildly atrophic without nodularity.  Heart size normal. No visible coronary calcification. Minimal atherosclerosis involving the aortic arch and the upper abdominal aorta.  Gallbladder surgically absent. Mild diffuse steatosis involving the visualized liver without focal parenchymal abnormality. Mildly dilated pancreatic duct in the body of the pancreas likely due to mild atrophy as no mass is identified in the visualized pancreas. Visualized upper abdomen otherwise unremarkable. Bone window images demonstrate mid and lower thoracic spondylosis.  IMPRESSION: 1. Right upper lobe pneumonia. No evidence of central obstructing mass. 2. Associated small parapneumonic effusion on the right. 3. Mild diffuse hepatic steatosis without focal parenchymal abnormality involving the visualized liver. 4. Mild dilation of the pancreatic duct, likely secondary to mild pancreatic  atrophy.   Electronically Signed  By: Evangeline Dakin M.D.  On: 11/19/2013 09:59

## 2013-11-23 NOTE — Patient Instructions (Signed)
Continued rest  Activity as tolerated  You may be tired for a few more weeks but progress as expected.  Contact us if fever returns or relapse.  Recheck c xray week of dec 14 or thereabouts and fu visit if not feeling back close to baseline.  When better return to heart healthy exercise and diet . To prevent   Heart disease . Etc.

## 2013-11-28 ENCOUNTER — Encounter: Payer: Self-pay | Admitting: Family Medicine

## 2013-11-28 ENCOUNTER — Telehealth: Payer: Self-pay | Admitting: Internal Medicine

## 2013-11-28 NOTE — Telephone Encounter (Signed)
Sent by fax.

## 2013-11-28 NOTE — Telephone Encounter (Signed)
Pt called she need a letter faxed to her employee stating how long she been out of work.  Pt said is has been since 11/16/13 that she has been out.Jillian Escobar Fax number ;[phone]

## 2013-12-02 ENCOUNTER — Encounter: Payer: Self-pay | Admitting: Internal Medicine

## 2013-12-03 ENCOUNTER — Telehealth: Payer: Self-pay | Admitting: Internal Medicine

## 2013-12-03 NOTE — Telephone Encounter (Signed)
Noted  

## 2013-12-03 NOTE — Telephone Encounter (Signed)
Pt needs note for employer stating she will return on 12/07. Note cannot say 'when she feels better'. She will be in tomorrow for appt.

## 2013-12-04 ENCOUNTER — Ambulatory Visit (INDEPENDENT_AMBULATORY_CARE_PROVIDER_SITE_OTHER)
Admission: RE | Admit: 2013-12-04 | Discharge: 2013-12-04 | Disposition: A | Discharge status: Home / Self Care | Payer: BC Managed Care – PPO | Source: Ambulatory Visit | Attending: Internal Medicine | Admitting: Internal Medicine

## 2013-12-04 ENCOUNTER — Ambulatory Visit (INDEPENDENT_AMBULATORY_CARE_PROVIDER_SITE_OTHER): Payer: BC Managed Care – PPO | Admitting: Internal Medicine

## 2013-12-04 ENCOUNTER — Encounter: Payer: Self-pay | Admitting: Family Medicine

## 2013-12-04 ENCOUNTER — Encounter: Payer: Self-pay | Admitting: Internal Medicine

## 2013-12-04 VITALS — BP 140/84 | HR 75 | Temp 97.8°F | Wt 166.7 lb

## 2013-12-04 DIAGNOSIS — R0602 Shortness of breath: Secondary | ICD-10-CM | POA: Insufficient documentation

## 2013-12-04 DIAGNOSIS — J181 Lobar pneumonia, unspecified organism: Principal | ICD-10-CM

## 2013-12-04 DIAGNOSIS — J189 Pneumonia, unspecified organism: Secondary | ICD-10-CM

## 2013-12-04 MED ORDER — ALBUTEROL SULFATE (2.5 MG/3ML) 0.083% IN NEBU
2.5000 mg | INHALATION_SOLUTION | Freq: Once | RESPIRATORY_TRACT | Status: AC
  Administered 2013-12-04: 2.5 mg via RESPIRATORY_TRACT

## 2013-12-04 MED ORDER — ALBUTEROL SULFATE HFA 108 (90 BASE) MCG/ACT IN AERS: 2.0000 | INHALATION_SPRAY | Freq: Four times a day (QID) | RESPIRATORY_TRACT | Status: DC | PRN

## 2013-12-04 NOTE — Patient Instructions (Addendum)
Get  Your chest x ray today. Add inhaler  To mix   Depending  onresults  Consider  seeing pulmonary if needed .

## 2013-12-04 NOTE — Progress Notes (Signed)
Pre visit review using our clinic review tool, if applicable. No additional management support is needed unless otherwise documented below in the visit note.  Chief Complaint  Patient presents with  . Follow-up    HPI: Jillian Escobar 63 y.o. with recetn rx for rul pna   Having persistent fever low grade and malaise  Chills and cold sweats  And very exhausted and    Cough  Coughing is dry hack  Feels like something there no production . Hard  To  take deep breath .       She is now off the medicine for about 5 days. Thinks she might be wheezing no hemoptysis no vomiting no fever. ROS: See pertinent positives and negatives per HPI.  Past Medical History  Diagnosis Date  . GERD (gastroesophageal reflux disease)   . Hx of varicella   . Shingles 10/26/2010    Family History  Problem Relation Age of Onset  . Heart disease Mother     stent in 58 s  . COPD Father   . Heart disease Father     cabg in 47s   . Glaucoma Father   . Dementia Father   . Ulcerative colitis Sister   . Ulcerative colitis Brother     History   Social History  . Marital Status: Married    Spouse Name: N/A    Number of Children: N/A  . Years of Education: N/A   Social History Main Topics  . Smoking status: Former Smoker    Quit date: 01/04/1982  . Smokeless tobacco: None  . Alcohol Use: Yes  . Drug Use: No  . Sexual Activity: None   Other Topics Concern  . None   Social History Narrative   H H of 2    Married   Former tobacco   Pet 2 dog and 2 cats .   [employer] pre [employer] and [employer]   G2P1   Orig from [place] moved over 20 years ago          Outpatient Encounter Prescriptions as of 12/04/2013  Medication Sig  . esomeprazole (NEXIUM) 40 MG capsule Take 1 capsule (40 mg total) by mouth every other day.  . estrogen, conjugated,-medroxyprogesterone (PREMPRO) 0.3-1.5 MG per tablet Take 1 tablet by mouth daily.  [name] levothyroxine (SYNTHROID) 50 MCG tablet TAKE 1 TABLET DAILY    . albuterol (PROVENTIL HFA;VENTOLIN HFA) 108 (90 BASE) MCG/ACT inhaler Inhale 2 puffs into the lungs every 6 (six) hours as needed for wheezing or shortness of breath.  [name] albuterol (PROVENTIL) (2.5 MG/3ML) 0.083% nebulizer solution 2.5 mg     EXAM:  BP 140/84 mmHg  Pulse 75  Temp(Src) 97.8 F (36.6 C) (Oral)  Wt 166 lb 11.2 oz (75.615 kg)  SpO2 98%  Body mass index is 29.06 kg/(m^2).  GENERAL: vitals reviewed and listed above, alert, oriented, appears well hydrated and in no acute distress looks tired but not toxic much better than initial illness HEENT: atraumatic, conjunctiva  clear, no obvious abnormalities on inspection of external nose and ears OP : no lesion edema or exudate  NECK: no obvious masses on inspection palpation  LUNGS: clear to auscultation bilaterally,  ? Faint tubular sound upper chest bilaterally  , rales or rhonchi, good air movementseemingly  CV: HRRR, no clubbing cyanosis or  peripheral edema nl cap refill  MS: moves all extremities without noticeable focal  abnormality PSYCH: pleasant and cooperative, no obvious depression or anxiety looks tired  Nebulizer albuterol given feels like she is that her able to take a deep breath but still has chest heaviness. ASSESSMENT AND PLAN:  Discussed the following assessment and plan:  Right upper lobe pneumonia  - Plan: albuterol (PROVENTIL) (2.5 MG/3ML) 0.083% nebulizer solution 2.5 mg  Shortness of breath - Plan: albuterol (PROVENTIL) (2.5 MG/3ML) 0.083% nebulizer solution 2.5 mg Sent to get the x-ray possibly secondary fatigue bronchospasm. -Patient advised to return or notify health care team  if symptoms worsen ,persist or new concerns arise.  Patient Instructions  Get  Your chest x ray today. Add inhaler  To mix   Depending  onresults  Consider  seeing pulmonary if needed .    Standley Brooking. Yasha Tibbett M.D. See x-ray results resolution of infiltrate we'll begin albuterol every 6 hours as needed until she improves.  If persistent progressive need another follow-up consider seeing pulmonary I suspect it was just a significant illness and it is been difficult for her to recover.

## 2013-12-11 ENCOUNTER — Telehealth: Payer: Self-pay | Admitting: Internal Medicine

## 2013-12-11 NOTE — Telephone Encounter (Signed)
Pt needs a work release note stating she is release for doctor care and can return to work on 12/7. Fax to attn kat nunn [phone]. Pt is aware md out

## 2013-12-12 ENCOUNTER — Encounter: Payer: Self-pay | Admitting: Family Medicine

## 2013-12-12 NOTE — Telephone Encounter (Signed)
Sent by fax.  Received transmission receipt that the fax was successful.

## 2014-01-24 ENCOUNTER — Other Ambulatory Visit: Payer: Self-pay | Admitting: Internal Medicine

## 2014-01-27 NOTE — Telephone Encounter (Signed)
Have patient schedule a cpx or   OV for med review  In about 3 months   Refill for 90days until is seen

## 2014-01-28 ENCOUNTER — Telehealth: Payer: Self-pay | Admitting: Family Medicine

## 2014-01-28 ENCOUNTER — Other Ambulatory Visit: Payer: Self-pay | Admitting: Family Medicine

## 2014-01-28 DIAGNOSIS — Z0000 Encounter for general adult medical examination without abnormal findings: Secondary | ICD-10-CM

## 2014-01-28 NOTE — Telephone Encounter (Signed)
Scheduled 03/04/14 at 3 pm.

## 2014-01-28 NOTE — Telephone Encounter (Signed)
Pt will cb to sch

## 2014-01-28 NOTE — Telephone Encounter (Signed)
Pt is past due for CPX.  I have refilled her Prempro for 90 days.  Please help the pt to get on the schedule for CPX before she runs out of medication.  I have also placed orders for lab work.

## 2014-01-28 NOTE — Telephone Encounter (Signed)
Sent to the pharmacy by e-scribe.  Will send a message to scheduling to help the pt get on the schedule.

## 2014-02-11 ENCOUNTER — Other Ambulatory Visit (INDEPENDENT_AMBULATORY_CARE_PROVIDER_SITE_OTHER): Payer: BC Managed Care – PPO

## 2014-02-11 DIAGNOSIS — Z0000 Encounter for general adult medical examination without abnormal findings: Secondary | ICD-10-CM

## 2014-02-11 LAB — BASIC METABOLIC PANEL
BUN: 15 mg/dL (ref 6–23)
CHLORIDE: 107 meq/L (ref 96–112)
CO2: 23 meq/L (ref 19–32)
Calcium: 9.4 mg/dL (ref 8.4–10.5)
Creatinine, Ser: 0.83 mg/dL (ref 0.40–1.20)
GFR: 74.19 mL/min (ref >60.00)
Glucose, Bld: 108 mg/dL — ABNORMAL HIGH (ref 70–99)
POTASSIUM: 4.1 meq/L (ref 3.5–5.1)
SODIUM: 141 meq/L (ref 135–145)

## 2014-02-11 LAB — LIPID PANEL
CHOLESTEROL: 210 mg/dL — AB (ref 0–200)
HDL: 51.4 mg/dL (ref >39.00)
LDL Cholesterol: 141 mg/dL — ABNORMAL HIGH (ref 0–99)
NONHDL: 158.6
TRIGLYCERIDES: 87 mg/dL (ref 0.0–149.0)
Total CHOL/HDL Ratio: 4
VLDL: 17.4 mg/dL (ref 0.0–40.0)

## 2014-02-11 LAB — CBC WITH DIFFERENTIAL/PLATELET
BASOS PCT: 0.6 % (ref 0.0–3.0)
Basophils Absolute: 0 10*3/uL (ref 0.0–0.1)
EOS ABS: 0.2 10*3/uL (ref 0.0–0.7)
Eosinophils Relative: 3.4 % (ref 0.0–5.0)
HCT: 41.9 % (ref 36.0–46.0)
Hemoglobin: 14.4 g/dL (ref 12.0–15.0)
LYMPHS PCT: 37.3 % (ref 12.0–46.0)
Lymphs Abs: 1.7 10*3/uL (ref 0.7–4.0)
MCHC: 34.4 g/dL (ref 30.0–36.0)
MCV: 87.9 fl (ref 78.0–100.0)
MONO ABS: 0.4 10*3/uL (ref 0.1–1.0)
Monocytes Relative: 8.5 % (ref 3.0–12.0)
NEUTROS PCT: 50.2 % (ref 43.0–77.0)
Neutro Abs: 2.3 10*3/uL (ref 1.4–7.7)
Platelets: 333 10*3/uL (ref 150.0–400.0)
RBC: 4.76 Mil/uL (ref 3.87–5.11)
RDW: 13.4 % (ref 11.5–15.5)
WBC: 4.6 10*3/uL (ref 4.0–10.5)

## 2014-02-11 LAB — TSH: TSH: 3.02 u[IU]/mL (ref 0.35–4.50)

## 2014-02-11 LAB — HEPATIC FUNCTION PANEL
ALK PHOS: 59 U/L (ref 39–117)
ALT: 18 U/L (ref 0–35)
AST: 14 U/L (ref 0–37)
Albumin: 4 g/dL (ref 3.5–5.2)
Bilirubin, Direct: 0.1 mg/dL (ref 0.0–0.3)
TOTAL PROTEIN: 6.7 g/dL (ref 6.0–8.3)
Total Bilirubin: 0.5 mg/dL (ref 0.2–1.2)

## 2014-02-25 ENCOUNTER — Ambulatory Visit (INDEPENDENT_AMBULATORY_CARE_PROVIDER_SITE_OTHER)
Admission: RE | Admit: 2014-02-25 | Discharge: 2014-02-25 | Disposition: A | Discharge status: Home / Self Care | Payer: BC Managed Care – PPO | Source: Ambulatory Visit | Attending: Internal Medicine | Admitting: Internal Medicine

## 2014-02-25 ENCOUNTER — Encounter: Payer: Self-pay | Admitting: Internal Medicine

## 2014-02-25 ENCOUNTER — Ambulatory Visit (INDEPENDENT_AMBULATORY_CARE_PROVIDER_SITE_OTHER): Payer: BC Managed Care – PPO | Admitting: Internal Medicine

## 2014-02-25 VITALS — BP 140/84 | HR 83 | Temp 98.5°F | Wt 171.7 lb

## 2014-02-25 DIAGNOSIS — J988 Other specified respiratory disorders: Secondary | ICD-10-CM

## 2014-02-25 DIAGNOSIS — Z8701 Personal history of pneumonia (recurrent): Secondary | ICD-10-CM

## 2014-02-25 DIAGNOSIS — R509 Fever, unspecified: Secondary | ICD-10-CM

## 2014-02-25 DIAGNOSIS — J22 Unspecified acute lower respiratory infection: Secondary | ICD-10-CM

## 2014-02-25 NOTE — Patient Instructions (Signed)
  Chest seems clear today and prob not pna  But get x ray because of your recent  Pneumonia dx  q q

## 2014-02-25 NOTE — Progress Notes (Signed)
Pre visit review using our clinic review tool, if applicable. No additional management support is needed unless otherwise documented below in the visit note.  Chief Complaint  Patient presents with  . Fever    Started Saturday Evening  . Cough  . Shortness of Breath  . Headache  . Chills  . Night Sweats  . Generalized Body Aches    HPI: Patient Jillian Escobar  comes in today for SDA for  new problem evaluation. 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 Cough st malaise some chest tightness no pain no VD rash  husband in  Not sick ROS: See pertinent positives and negatives per HPI. Had rul pna in nov December last x ray showed clearing has had some uri cough sx off and on since then Past Medical History  Diagnosis Date  . GERD (gastroesophageal reflux disease)   . Hx of varicella   . Shingles 10/26/2010    Family History  Problem Relation Age of Onset  . Heart disease Mother     stent in 107 s  . COPD Father   . Heart disease Father     cabg in 32s   . Glaucoma Father   . Dementia Father   . Ulcerative colitis Sister   . Ulcerative colitis Brother     History   Social History  . Marital Status: Married    Spouse Name: N/A  . Number of Children: N/A  . Years of Education: N/A   Social History Main Topics  . Smoking status: Former Smoker    Quit date: 01/04/1982  . Smokeless tobacco: Not on file  . Alcohol Use: Yes  . Drug Use: No  . Sexual Activity: Not on file   Other Topics Concern  . None   Social History Narrative   H H of 2    Married   Former tobacco   Pet 2 dog and 2 cats .   [employer]   G2P1   Orig from [place] moved over 20 years ago          Outpatient Encounter Prescriptions as of 02/25/2014  Medication Sig  . albuterol (PROVENTIL HFA;VENTOLIN HFA) 108 (90 BASE) MCG/ACT inhaler Inhale 2  puffs into the lungs every 6 (six) hours as needed for wheezing or shortness of breath.  . esomeprazole (NEXIUM) 40 MG capsule Take 1 capsule (40 mg total) by mouth every other day.  . levothyroxine (SYNTHROID) 50 MCG tablet TAKE 1 TABLET DAILY  . PREMPRO 0.3-1.5 MG per tablet TAKE 1 TABLET DAILY    EXAM:  BP 140/84 mmHg  Pulse 83  Temp(Src) 98.5 F (36.9 C) (Oral)  Wt 171 lb 11.2 oz (77.883 kg)  SpO2 98%  Body mass index is 29.93 kg/(m^2). WDWN in NAD  quiet respirations; mildly congested  somewhat hoarse. Non toxic . HEENT: Normocephalic ;atraumatic , Eyes;  PERRL, EOMs  Full, lids and conjunctiva clear,,Ears: no deformities, canals nl, TM landmarks normal, Nose: no deformity or discharge but congested;face minimally tender Mouth : OP clear without lesion or edema . Neck: Supple without adenopathy or masses or bruits Chest:  Clear to A&P without wheezes rales or rhonchi all lung fields CV:  S1-S2 no gallops or murmurs peripheral perfusion is normal Skin :nl perfusion and no  acute rashes  MS: moves all extremities without noticeable focal  abnormality PSYCH: pleasant and cooperative, no obvious depression or anxiety  ASSESSMENT AND PLAN:  Discussed the following assessment and plan:  Fever and chills - Plan: DG Chest 2 View  Acute respiratory infection - Plan: DG Chest 2 View  Hx of bacterial pneumonia hihg exposures to kids  prob viral but  High risk pna with recent   rul  pna  Get x ray     Sx rx at this time  cose observation indicated . For alarm  features  Note for work  -Patient advised to return or notify health care team  if symptoms worsen ,persist or new concerns arise.  Patient Instructions   Chest seems clear today and prob not pna  But get x ray because of your recent  Pneumonia dx  q q     Mariann Laster K. Masaki Rothbauer M.D.

## 2014-03-04 ENCOUNTER — Encounter: Payer: Self-pay | Admitting: Internal Medicine

## 2014-03-04 ENCOUNTER — Ambulatory Visit (INDEPENDENT_AMBULATORY_CARE_PROVIDER_SITE_OTHER): Payer: BC Managed Care – PPO | Admitting: Internal Medicine

## 2014-03-04 VITALS — BP 136/84 | Temp 98.7°F | Ht 63.25 in | Wt 166.0 lb

## 2014-03-04 DIAGNOSIS — J22 Unspecified acute lower respiratory infection: Secondary | ICD-10-CM | POA: Insufficient documentation

## 2014-03-04 DIAGNOSIS — J988 Other specified respiratory disorders: Secondary | ICD-10-CM

## 2014-03-04 DIAGNOSIS — Z79890 Hormone replacement therapy (postmenopausal): Secondary | ICD-10-CM

## 2014-03-04 DIAGNOSIS — Z79899 Other long term (current) drug therapy: Secondary | ICD-10-CM

## 2014-03-04 DIAGNOSIS — Z0000 Encounter for general adult medical examination without abnormal findings: Secondary | ICD-10-CM

## 2014-03-04 DIAGNOSIS — E039 Hypothyroidism, unspecified: Secondary | ICD-10-CM | POA: Insufficient documentation

## 2014-03-04 DIAGNOSIS — R7301 Impaired fasting glucose: Secondary | ICD-10-CM

## 2014-03-04 NOTE — Progress Notes (Signed)
Pre visit review using our clinic review tool, if applicable. No additional management support is needed unless otherwise documented below in the visit note.  Chief Complaint  Patient presents with  . Annual Exam    resp infection now in head  . Hypothyroidism    HPI: Patient  Jillian Escobar  64 y.o. comes in today for Preventive Health Care visit  And Chronic disease management.  Since last visit no fe ver now lots of head congestion no face pain . Still tired   nexium every other day  Or if needed .for gerd sx   Thyroid same dose   prem pro qod . Still some hot feelings thinks awakens and then back to sleep   School  Working  .  FT  To travel tp [place] in [place] / about taking antibotric with her  Health Maintenance  Topic Date Due  . HIV Screening  09/25/1967  . ZOSTAVAX  09/24/2012  . INFLUENZA VACCINE  08/04/2013  . MAMMOGRAM  06/21/2015  . PAP SMEAR  08/22/2015  . TETANUS/TDAP  03/01/2019  . COLONOSCOPY  07/22/2019   Health Maintenance Review LIFESTYLE:  Exercise:   Walking   Sob going up hill  Tobacco/ETS: Alcohol:  Ave 1 per day  Sugar beverages: no Sleep: about 8 some light . Sleep  Drug use: no Bone density:  Colonoscopy: UTD  To be 5 years  2016  PAP: utd  MAMMO: utd    ROS:  GEN/ HEENT: No fever, significant weight changes  headaches vision problems hearing changes, CV/ PULM; No chest pain shortness of breath cough, syncope,edema  change in exercise tolerance. GI /GU: No adominal pain, vomiting, change in bowel habits. No blood in the stool. No significant GU symptoms. SKIN/HEME: ,no acute skin rashes suspicious lesions or bleeding. No lymphadenopathy, nodules, masses.  NEURO/ PSYCH:  No neurologic signs such as weakness numbness. No depression anxiety. IMM/ Allergy: No unusual infections.  Allergy .   REST of 12 system review negative except as per HPI   Past Medical History  Diagnosis Date  . GERD (gastroesophageal reflux disease)   . Hx of  varicella   . Shingles 10/26/2010    Past Surgical History  Procedure Laterality Date  . Cholecystectomy    . Tubal ligation    . Exploratory laparotomy      Family History  Problem Relation Age of Onset  . Heart disease Mother     stent in 74 s  . COPD Father   . Heart disease Father     cabg in 86s   . Glaucoma Father   . Dementia Father   . Ulcerative colitis Sister   . Ulcerative colitis Brother     History   Social History  . Marital Status: Married    Spouse Name: N/A  . Number of Children: N/A  . Years of Education: N/A   Social History Main Topics  . Smoking status: Former Smoker    Quit date: 01/04/1982  . Smokeless tobacco: Not on file  . Alcohol Use: Yes  . Drug Use: No  . Sexual Activity: Not on file   Other Topics Concern  . None   Social History Narrative   H H of 2    Married   Former tobacco   Pet 2 dog and 2 cats .   TA [place] pre K and K   G2P1   [facility]ig from [place] moved over 20 years ago  Outpatient Encounter Prescriptions as of 03/04/2014  Medication Sig  . albuterol (PROVENTIL HFA;VENTOLIN HFA) 108 (90 BASE) MCG/ACT inhaler Inhale 2 puffs into the lungs every 6 (six) hours as needed for wheezing or shortness of breath.  . esomeprazole (NEXIUM) 40 MG capsule Take 1 capsule (40 mg total) by mouth every other day.  . levothyroxine (SYNTHROID) 50 MCG tablet TAKE 1 TABLET DAILY  . PREMPRO 0.3-1.5 MG per tablet TAKE 1 TABLET DAILY    EXAM:  BP 136/84 mmHg  Temp(Src) 98.7 F (37.1 C) (Oral)  Ht 5' 3.25" (1.607 m)  Wt 166 lb (75.297 kg)  BMI 29.16 kg/m2  Body mass index is 29.16 kg/(m^2).  Physical Exam: Vital signs reviewed LNL:GXQJ is a well-developed well-nourished alert cooperative    who appearsr stated age in no acute distress.  Very congested in nose non toxic looks well otherwise HEENT: normocephalic atraumatic , Eyes: PERRL EOM's full, conjunctiva clear, Nares: 2+ congested ,no deformity  discharge or tenderness., Ears: no deformity EAC's clear TMs with normal landmarks. Mouth: clear OP, no lesions, edema.  Moist mucous membranes. Dentition in adequate repair. NECK: supple without masses, thyromegaly or bruits. CHEST/PULM:  Clear to auscultation and percussion breath sounds equal no wheeze , rales or rhonchi. No chest wall deformities or tenderness. CV: PMI is nondisplaced, S1 S2 no gallops, murmurs, rubs. Peripheral pulses are full without delay.No JVD .  Breast: normal by inspection . No dimpling, discharge, masses, tenderness or discharge . ABDOMEN: Bowel sounds normal nontender  No guard or rebound, no hepato splenomegal no CVA tenderness.  No hernia. Extremtities:  No clubbing cyanosis or edema, no acute joint swelling or redness no focal atrophy NEURO:  Oriented x3, cranial nerves 3-12 appear to be intact, no obvious focal weakness,gait within normal limits no abnormal reflexes or asymmetrical SKIN: No acute rashes normal turgor, color, no bruising or petechiae. PSYCH: Oriented, good eye contact, no obvious depression anxiety, cognition and judgment appear normal. LN: no cervical axillary inguinal adenopathy  Lab Results  Component Value Date   WBC 4.6 02/11/2014   HGB 14.4 02/11/2014   HCT 41.9 02/11/2014   PLT 333.0 02/11/2014   GLUCOSE 108* 02/11/2014   CHOL 210* 02/11/2014   TRIG 87.0 02/11/2014   HDL 51.40 02/11/2014   LDLDIRECT 158.4 08/14/2012   LDLCALC 141* 02/11/2014   ALT 18 02/11/2014   AST 14 02/11/2014   NA 141 02/11/2014   K 4.1 02/11/2014   CL 107 02/11/2014   CREATININE 0.83 02/11/2014   BUN 15 02/11/2014   CO2 23 02/11/2014   TSH 3.02 02/11/2014    ASSESSMENT AND PLAN:  Discussed the following assessment and plan:  Visit for preventive health examination  Hypothyroidism, unspecified hypothyroidism type - repeat tsh in 4-70months consider dose adjustment if fatigue still and tsh over 2.5   Fasting hyperglycemia - lsi  to avoid  diabetes  Postmenopausal HRT (hormone replacement therapy) - on qod dosing  wean as tolerated   Medication management - nexium   Acute respiratory infection - improvin uncomplcated   Patient Care Team: Burnis Medin, MD as PCP - General Patient Instructions  Continue lifestyle intervention healthy eating and exercise .  Healthy lifestyle includes : At least 150 minutes of exercise weeks  , weight at healthy levels, which is usually   BMI 19-25. Avoid trans fats and processed foods;  Increase fresh fruits and veges to 5 servings per day. And avoid sweet beverages including tea and juice. Mediterranean diet with olive  oil and nuts have been noted to be heart and brain healthy . Avoid tobacco products . Limit  alcohol to  7 per week for women and 14 servings for men.  Get adequate sleep . Wear seat belts . Don't text and drive .   Can get a TSH in 4-6 months lab only  And if ok yearly check up.  Contact [place] month before travel   For antibiotic for travelers diarrhea or other. Wean the prem pro as tolerated   Plan to get off in the next year if possible . Respiratory infection should be improved  On its own and symptomatic treatment     Standley Brooking. Perseus Westall M.D.

## 2014-03-04 NOTE — Patient Instructions (Addendum)
Continue lifestyle intervention healthy eating and exercise .  Healthy lifestyle includes : At least 150 minutes of exercise weeks  , weight at healthy levels, which is usually   BMI 19-25. Avoid trans fats and processed foods;  Increase fresh fruits and veges to 5 servings per day. And avoid sweet beverages including tea and juice. Mediterranean diet with olive oil and nuts have been noted to be heart and brain healthy . Avoid tobacco products . Limit  alcohol to  7 per week for women and 14 servings for men.  Get adequate sleep . Wear seat belts . Don't text and drive .   Can get a TSH in 4-6 months lab only  And if ok yearly check up.  Contact [place] month before travel   For antibiotic for travelers diarrhea or other. Wean the prem pro as tolerated   Plan to get off in the next year if possible . Respiratory infection should be improved  On its own and symptomatic treatment

## 2014-04-28 ENCOUNTER — Other Ambulatory Visit: Payer: Self-pay | Admitting: Internal Medicine

## 2014-04-29 NOTE — Telephone Encounter (Signed)
Sent to the pharmacy by e-scribe. 

## 2014-05-01 ENCOUNTER — Telehealth: Payer: Self-pay | Admitting: Internal Medicine

## 2014-05-01 MED ORDER — LEVOTHYROXINE SODIUM 50 MCG PO TABS: ORAL_TABLET | ORAL | Status: DC

## 2014-05-01 NOTE — Telephone Encounter (Signed)
Nexium filled on 04/29/14.  Levothyroxine sent in for 6 months.

## 2014-05-01 NOTE — Telephone Encounter (Signed)
Pt needs refills on levothyroxine 50 mcg and esomeprazole 40 mg #90 each w/refills send to [facility]

## 2014-05-05 ENCOUNTER — Other Ambulatory Visit: Payer: Self-pay | Admitting: Internal Medicine

## 2014-05-06 NOTE — Telephone Encounter (Signed)
Denied.  Filled on 04/29/14

## 2014-05-23 ENCOUNTER — Other Ambulatory Visit: Payer: Self-pay

## 2014-05-23 DIAGNOSIS — Z1231 Encounter for screening mammogram for malignant neoplasm of breast: Secondary | ICD-10-CM

## 2014-06-15 ENCOUNTER — Other Ambulatory Visit: Payer: Self-pay | Admitting: Internal Medicine

## 2014-06-17 NOTE — Telephone Encounter (Signed)
Denied.  Sent to the pharmacy in April for 9 months

## 2014-06-24 ENCOUNTER — Other Ambulatory Visit: Payer: Self-pay | Admitting: Family Medicine

## 2014-06-24 ENCOUNTER — Telehealth: Payer: Self-pay | Admitting: Internal Medicine

## 2014-06-24 DIAGNOSIS — E038 Other specified hypothyroidism: Secondary | ICD-10-CM

## 2014-06-24 NOTE — Telephone Encounter (Signed)
I have placed the order.  Please schedule with the pt.  She does not need to fast.  Thanks!

## 2014-06-24 NOTE — Telephone Encounter (Signed)
Pt needs tsh blood work check. Can I [place]?

## 2014-06-24 NOTE — Telephone Encounter (Signed)
Pt has been sch

## 2014-06-25 ENCOUNTER — Other Ambulatory Visit (INDEPENDENT_AMBULATORY_CARE_PROVIDER_SITE_OTHER): Payer: BC Managed Care – PPO

## 2014-06-25 ENCOUNTER — Ambulatory Visit
Admission: RE | Admit: 2014-06-25 | Discharge: 2014-06-25 | Disposition: A | Discharge status: Home / Self Care | Payer: BC Managed Care – PPO | Source: Ambulatory Visit

## 2014-06-25 DIAGNOSIS — Z1231 Encounter for screening mammogram for malignant neoplasm of breast: Secondary | ICD-10-CM

## 2014-06-25 DIAGNOSIS — E038 Other specified hypothyroidism: Secondary | ICD-10-CM

## 2014-06-25 LAB — TSH: TSH: 2.25 u[IU]/mL (ref 0.35–4.50)

## 2014-11-05 DIAGNOSIS — J189 Pneumonia, unspecified organism: Secondary | ICD-10-CM

## 2014-11-05 HISTORY — DX: Pneumonia, unspecified organism: J18.9

## 2014-11-06 ENCOUNTER — Other Ambulatory Visit: Payer: Self-pay | Admitting: Family Medicine

## 2014-11-06 ENCOUNTER — Telehealth: Payer: Self-pay | Admitting: Internal Medicine

## 2014-11-06 DIAGNOSIS — Z0000 Encounter for general adult medical examination without abnormal findings: Secondary | ICD-10-CM

## 2014-11-06 MED ORDER — LEVOTHYROXINE SODIUM 50 MCG PO TABS: ORAL_TABLET | ORAL | Status: DC

## 2014-11-06 NOTE — Telephone Encounter (Signed)
Pt request refill of the following: levothyroxine (SYNTHROID) 50 MCG tablet   Phamacy: [facility]

## 2014-11-06 NOTE — Telephone Encounter (Signed)
Sent to the pharmacy by e-scribe.  Pt is due for cpx and lab work in Feb 2017.  Please help the pt to get on the schedule.  I have placed the lab orders.  Thanks!

## 2014-11-07 NOTE — Telephone Encounter (Signed)
lmovm to call and schedule a physical  °

## 2014-11-08 ENCOUNTER — Telehealth: Payer: Self-pay | Admitting: Internal Medicine

## 2014-11-08 NOTE — Telephone Encounter (Signed)
Pt needs cpx after 3pm for march 2017. Can I create 30 min slot?

## 2014-11-12 NOTE — Telephone Encounter (Signed)
yes

## 2014-11-13 NOTE — Telephone Encounter (Signed)
Pt has been sch for cpx 03-10-14 at 345 pm and would like to have cpx labs at [place]. Please put order in system

## 2014-11-14 ENCOUNTER — Other Ambulatory Visit: Payer: Self-pay | Admitting: Family Medicine

## 2014-11-14 DIAGNOSIS — Z0000 Encounter for general adult medical examination without abnormal findings: Secondary | ICD-10-CM

## 2014-11-14 NOTE — Telephone Encounter (Signed)
Orders placed in the system.

## 2015-01-05 ENCOUNTER — Encounter (HOSPITAL_COMMUNITY): Payer: Self-pay | Admitting: Emergency Medicine

## 2015-01-05 ENCOUNTER — Emergency Department (HOSPITAL_COMMUNITY)
Admission: EM | Admit: 2015-01-05 | Discharge: 2015-01-05 | Disposition: A | Discharge status: Home / Self Care | Payer: BC Managed Care – PPO | Attending: Emergency Medicine | Admitting: Emergency Medicine

## 2015-01-05 ENCOUNTER — Emergency Department (HOSPITAL_COMMUNITY): Payer: BC Managed Care – PPO

## 2015-01-05 DIAGNOSIS — R000 Tachycardia, unspecified: Secondary | ICD-10-CM | POA: Diagnosis not present

## 2015-01-05 DIAGNOSIS — R55 Syncope and collapse: Secondary | ICD-10-CM | POA: Insufficient documentation

## 2015-01-05 DIAGNOSIS — R0789 Other chest pain: Secondary | ICD-10-CM

## 2015-01-05 DIAGNOSIS — Z87891 Personal history of nicotine dependence: Secondary | ICD-10-CM | POA: Insufficient documentation

## 2015-01-05 DIAGNOSIS — Y998 Other external cause status: Secondary | ICD-10-CM | POA: Diagnosis not present

## 2015-01-05 DIAGNOSIS — K219 Gastro-esophageal reflux disease without esophagitis: Secondary | ICD-10-CM | POA: Diagnosis not present

## 2015-01-05 DIAGNOSIS — Y9289 Other specified places as the place of occurrence of the external cause: Secondary | ICD-10-CM | POA: Insufficient documentation

## 2015-01-05 DIAGNOSIS — Z8619 Personal history of other infectious and parasitic diseases: Secondary | ICD-10-CM | POA: Insufficient documentation

## 2015-01-05 DIAGNOSIS — Y9389 Activity, other specified: Secondary | ICD-10-CM | POA: Insufficient documentation

## 2015-01-05 DIAGNOSIS — S0990XA Unspecified injury of head, initial encounter: Secondary | ICD-10-CM | POA: Insufficient documentation

## 2015-01-05 DIAGNOSIS — W01198A Fall on same level from slipping, tripping and stumbling with subsequent striking against other object, initial encounter: Secondary | ICD-10-CM | POA: Insufficient documentation

## 2015-01-05 DIAGNOSIS — S299XXA Unspecified injury of thorax, initial encounter: Secondary | ICD-10-CM | POA: Diagnosis not present

## 2015-01-05 DIAGNOSIS — Z79899 Other long term (current) drug therapy: Secondary | ICD-10-CM | POA: Insufficient documentation

## 2015-01-05 LAB — CBC WITH DIFFERENTIAL/PLATELET
BASOS PCT: 0 %
Basophils Absolute: 0 10*3/uL (ref 0.0–0.1)
EOS ABS: 0.1 10*3/uL (ref 0.0–0.7)
EOS PCT: 1 %
HCT: 40.2 % (ref 36.0–46.0)
Hemoglobin: 14.3 g/dL (ref 12.0–15.0)
LYMPHS ABS: 1.1 10*3/uL (ref 0.7–4.0)
Lymphocytes Relative: 12 %
MCH: 31.2 pg (ref 26.0–34.0)
MCHC: 35.6 g/dL (ref 30.0–36.0)
MCV: 87.6 fL (ref 78.0–100.0)
MONO ABS: 0.6 10*3/uL (ref 0.1–1.0)
MONOS PCT: 7 %
Neutro Abs: 7.1 10*3/uL (ref 1.7–7.7)
Neutrophils Relative %: 80 %
Platelets: 264 10*3/uL (ref 150–400)
RBC: 4.59 MIL/uL (ref 3.87–5.11)
RDW: 12.7 % (ref 11.5–15.5)
WBC: 9 10*3/uL (ref 4.0–10.5)

## 2015-01-05 LAB — BASIC METABOLIC PANEL
Anion gap: 10 (ref 5–15)
BUN: 13 mg/dL (ref 6–20)
CALCIUM: 9.2 mg/dL (ref 8.9–10.3)
CO2: 21 mmol/L — ABNORMAL LOW (ref 22–32)
CREATININE: 0.75 mg/dL (ref 0.44–1.00)
Chloride: 110 mmol/L (ref 101–111)
GFR calc non Af Amer: >60 mL/min (ref >60)
Glucose, Bld: 141 mg/dL — ABNORMAL HIGH (ref 65–99)
Potassium: 4.2 mmol/L (ref 3.5–5.1)
SODIUM: 141 mmol/L (ref 135–145)

## 2015-01-05 LAB — D-DIMER, QUANTITATIVE: D-Dimer, Quant: 2.55 ug/mL-FEU — ABNORMAL HIGH (ref 0.00–0.50)

## 2015-01-05 LAB — TROPONIN I

## 2015-01-05 LAB — I-STAT TROPONIN, ED: Troponin i, poc: 0 ng/mL (ref 0.00–0.08)

## 2015-01-05 MED ORDER — SODIUM CHLORIDE 0.9 % IV BOLUS (SEPSIS)
500.0000 mL | Freq: Once | INTRAVENOUS | Status: AC
  Administered 2015-01-05: 500 mL via INTRAVENOUS

## 2015-01-05 MED ORDER — SODIUM CHLORIDE 0.9 % IV BOLUS (SEPSIS)
1000.0000 mL | Freq: Once | INTRAVENOUS | Status: AC
  Administered 2015-01-05: 1000 mL via INTRAVENOUS

## 2015-01-05 MED ORDER — IOHEXOL 350 MG/ML SOLN
100.0000 mL | Freq: Once | INTRAVENOUS | Status: AC | PRN
  Administered 2015-01-05: 100 mL via INTRAVENOUS

## 2015-01-05 NOTE — ED Notes (Signed)
Ice pack applied to lower back

## 2015-01-05 NOTE — ED Notes (Signed)
Called CT to inquire as to when patient will be seen in CT. Informed that patient will be seen shortly.

## 2015-01-05 NOTE — Discharge Instructions (Signed)
Stay well-hydrated, avoid alcohol and follow up closely with primary doctor and cardiologist. No driving or operating machinery until cleared.  If you were given medicines take as directed.  If you are on coumadin or contraceptives realize their levels and effectiveness is altered by many different medicines.  If you have any reaction (rash, tongues swelling, other) to the medicines stop taking and see a physician.    If your blood pressure was elevated in the ER make sure you follow up for management with a primary doctor or return for chest pain, shortness of breath or stroke symptoms.  Please follow up as directed and return to the ER or see a physician for new or worsening symptoms.  Thank you. Filed Vitals:   01/05/15 0730 01/05/15 0745 01/05/15 0800 01/05/15 0815  BP: 127/73 132/73 129/71 132/75  Pulse: 87 84 87 80  Temp:      TempSrc:      Resp: 20 13 20 19   SpO2: 98% 99% 98% 98%

## 2015-01-05 NOTE — ED Notes (Signed)
Patient arrived via [facility]. EMS reports: patient experienced syncopal episode at a friend's house. Patient lost consciousness; fell backwards; hit posterior head. Patient was pale, diaphoretic. Patient c/o pain in posterior head and lower back pain (chronic, but increased since fall). BP 70/40 at first with EMS. 20 gauge IV in L hand. Patient received NS 500 ml. VSS upon arrival to ED - BP 128/64, Pulse 68, CBG 148. Patient drank 4 beers between 8 pm - midnight.

## 2015-01-05 NOTE — ED Notes (Signed)
Patient transported to CT 

## 2015-01-05 NOTE — ED Provider Notes (Signed)
CSN: [id number]     Arrival date & time 01/05/15  0115 History   By signing my name below, I, Forrestine Him, attest that this documentation has been prepared under the direction and in the presence of Elnora Morrison, MD.  Electronically Signed: Forrestine Him, ED Scribe. 01/05/2015. 3:28 AM.   Chief Complaint  Patient presents with  . Loss of Consciousness   The history is provided by the patient. No language interpreter was used.    HPI Comments: CHALENA POPPY is a 65 y.o. female without any pertinent past medical history who presents to the Emergency Department here after an episode of loss of consciousness this evening. Pt states she experienced some dizziness-room spinning followed by a syncopal episode. Pt admits to hitting her head and back against the floor. She admits to consuming 4 beers prior to episode. Pt also reports ongoing vertigo and chest heaviness this evening. No aggravating or alleviating factors at this time. Denies any blood in her stools. No new HA. Pt denies any cardiac history. Ms. Brown Human states these episodes have been recurrent and hopes to follow up with cardiology in the near future.  PCP: Lottie Dawson, MD    Past Medical History  Diagnosis Date  . GERD (gastroesophageal reflux disease)   . Hx of varicella   . Shingles 10/26/2010   Past Surgical History  Procedure Laterality Date  . Cholecystectomy    . Tubal ligation    . Exploratory laparotomy     Family History  Problem Relation Age of Onset  . Heart disease Mother     stent in 47 s  . COPD Father   . Heart disease Father     cabg in 76s   . Glaucoma Father   . Dementia Father   . Ulcerative colitis Sister   . Ulcerative colitis Brother    Social History  Substance Use Topics  . Smoking status: Former Smoker    Quit date: 01/04/1982  . Smokeless tobacco: None  . Alcohol Use: Yes   OB History    No data available     Review of Systems  Constitutional: Negative for fever and  chills.  Respiratory: Negative for cough and shortness of breath.   Cardiovascular: Positive for chest pain.  Gastrointestinal: Negative for nausea, vomiting and abdominal pain.  Musculoskeletal: Negative for back pain.  Neurological: Positive for dizziness and syncope.  Psychiatric/Behavioral: Negative for confusion.  All other systems reviewed and are negative.     Allergies  Review of patient's allergies indicates no known allergies.  Home Medications   Prior to Admission medications   Medication Sig Start Date End Date Taking? Authorizing Provider  CALCIUM PO Take 1 tablet by mouth daily.   Yes Historical Provider, MD  Cholecalciferol (VITAMIN D PO) Take 1 tablet by mouth daily.   Yes Historical Provider, MD  esomeprazole (NEXIUM) 40 MG capsule Take 1 capsule (40 mg total) by mouth every other day. 11/16/13  Yes Burnis Medin, MD  Lactobacillus (ACIDOPHILUS PO) Take 1 capsule by mouth daily.   Yes Historical Provider, MD  levothyroxine (SYNTHROID) 50 MCG tablet TAKE 1 TABLET DAILY 11/06/14  Yes Burnis Medin, MD  PREMPRO 0.3-1.5 MG per tablet TAKE 1 TABLET DAILY 01/28/14  Yes Burnis Medin, MD  VITAMIN E PO Take 1 capsule by mouth daily.   Yes Historical Provider, MD  albuterol (PROVENTIL HFA;VENTOLIN HFA) 108 (90 BASE) MCG/ACT inhaler Inhale 2 puffs into the lungs every 6 (six) hours as  needed for wheezing or shortness of breath. 12/04/13   Burnis Medin, MD   Triage Vitals: BP 132/75 mmHg  Pulse 80  Temp(Src) 97.7 F (36.5 C) (Oral)  Resp 19  SpO2 98%   Physical Exam  Constitutional: She is oriented to person, place, and time. She appears well-developed and well-nourished. No distress.  HENT:  Head: Normocephalic and atraumatic.  Dry mucous membranes  Eyes: Conjunctivae and EOM are normal. Pupils are equal, round, and reactive to light. Right eye exhibits no nystagmus. Left eye exhibits no nystagmus.  Neck: Normal range of motion.  Cardiovascular: Regular rhythm and  normal heart sounds.  Tachycardia present.   No murmur heard. Pulmonary/Chest: Effort normal and breath sounds normal.  Abdominal: Soft. Bowel sounds are normal. She exhibits no distension. There is no tenderness.  Musculoskeletal: Normal range of motion.  No midline cervical thoracic or lumbar tenderness full range of motion head neck.  Neurological: She is alert and oriented to person, place, and time.  Finger to nose test normal Grossly equal strength to upper extremities without drift  5+ strength to lower extremities bilaterally   Skin: Skin is warm and dry.  Psychiatric: She has a normal mood and affect. Judgment normal.  Nursing note and vitals reviewed.   ED Course  Procedures (including critical care time)  DIAGNOSTIC STUDIES: Oxygen Saturation is 98% on RA, Normal by my interpretation.    COORDINATION OF CARE: 3:23 AM- Will give fluids. Will order CT head without contrast, D-dimer, CB, BMP, and troponin I. Discussed treatment plan with pt at bedside and pt agreed to plan.     Labs Review Labs Reviewed  BASIC METABOLIC PANEL - Abnormal; Notable for the following:    CO2 21 (*)    Glucose, Bld 141 (*)    All other components within normal limits  D-DIMER, QUANTITATIVE (NOT AT [facility]) - Abnormal; Notable for the following:    D-Dimer, Quant 2.55 (*)    All other components within normal limits  CBC WITH DIFFERENTIAL/PLATELET  TROPONIN I  I-STAT TROPOININ, ED    Imaging Review Ct Head Wo Contrast  01/05/2015  CLINICAL DATA:  Syncope.  Head injury.  Dizziness. EXAM: CT HEAD WITHOUT CONTRAST TECHNIQUE: Contiguous axial images were obtained from the base of the skull through the vertex without intravenous contrast. COMPARISON:  None. FINDINGS: Mild global atrophy. No mass effect, midline shift, or acute intracranial hemorrhage. Ventricular system is unremarkable. Mastoid air cells are clear. Visualized paranasal sinuses are clear. IMPRESSION: No acute intracranial pathology.  Electronically Signed   By: Marybelle Killings M.D.   On: 01/05/2015 08:02   Ct Angio Chest Pe W/cm &/or Wo Cm  01/05/2015  CLINICAL DATA:  Syncope EXAM: CT ANGIOGRAPHY CHEST WITH CONTRAST TECHNIQUE: Multidetector CT imaging of the chest was performed using the standard protocol during bolus administration of intravenous contrast. Multiplanar CT image reconstructions and MIPs were obtained to evaluate the vascular anatomy. CONTRAST:  170mL OMNIPAQUE IOHEXOL 350 MG/ML SOLN COMPARISON:  11/19/2013 FINDINGS: There are no filling defects in the pulmonary arterial tree to suggest acute pulmonary thromboembolism. No evidence of aortic aneurysm or dissection. Great vessels are grossly patent. No evidence of abnormal mediastinal adenopathy No pneumothorax.  No pleural effusion. Scattered volume loss is present in the lungs. Previously noted right upper lobe consolidation has resolved. There is no evidence of endobronchial lesion. Mild bronchial wall thickening in the lower lobes. No acute bony deformity. Review of the MIP images confirms the above findings. IMPRESSION: No  evidence of acute pulmonary thromboembolism. Electronically Signed   By: Marybelle Killings M.D.   On: 01/05/2015 07:58   I have personally reviewed and evaluated these images and lab results as part of my medical decision-making.   EKG Interpretation   Date/Time:  Sunday January 05 2015 01:24:14 EST Ventricular Rate:  85 PR Interval:  155 QRS Duration: 90 QT Interval:  394 QTC Calculation: 468 R Axis:   -67 Text Interpretation:  Sinus rhythm Left anterior fascicular block Anterior  infarct, age indeterminate Confirmed by Janeane Cozart  MD, Maycol Hoying ([id number]) on  01/05/2015 1:40:05 AM      MDM   Final diagnoses:  Syncope and collapse  Acute head injury, initial encounter  Chest pressure   Patient presents with gradual onset syncope. Patient is had worsening chest pressure mild for the past few weeks. Exertional component. She has blood pressure  hypotensive prior to arrival normal the ER. This is patient's third episode. Patient denies classic blood clot risk factors. D-dimer sent positive, CT angio ordered. Troponin negative.   CT scan no blood clot, patient improved in the ER, troponin negative. Discussed patient will need very close follow-up with cardiologist and primary doctor for further workup, Holter monitor etc. Discussed observation the hospital versus outpatient follow-up, patient prefers outpatient. Discussed no driving until cleared.  Results and differential diagnosis were discussed with the patient/parent/guardian. Xrays were independently reviewed by myself.  Close follow up outpatient was discussed, comfortable with the plan.   Medications  sodium chloride 0.9 % bolus 500 mL (0 mLs Intravenous Stopped 01/05/15 0429)  sodium chloride 0.9 % bolus 1,000 mL (0 mLs Intravenous Stopped 01/05/15 0540)  iohexol (OMNIPAQUE) 350 MG/ML injection 100 mL (100 mLs Intravenous Contrast Given 01/05/15 0650)    Filed Vitals:   01/05/15 0730 01/05/15 0745 01/05/15 0800 01/05/15 0815  BP: 127/73 132/73 129/71 132/75  Pulse: 87 84 87 80  Temp:      TempSrc:      Resp: 20 13 20 19   SpO2: 98% 99% 98% 98%    Final diagnoses:  Syncope and collapse  Acute head injury, initial encounter  Chest pressure      Elnora Morrison, MD 01/05/15 [phone]

## 2015-01-07 ENCOUNTER — Telehealth: Payer: Self-pay | Admitting: Internal Medicine

## 2015-01-07 NOTE — Telephone Encounter (Signed)
Called cardiology.  Appointment rescheduled (moved up)  to 01/09/15 @ 8AM with Dr. Glenetta Hew.  Pt notified.  ED follow up with [facility] scheduled for 01/15/15 @ 4 PM WP notified of new cardiology appointment.  Pt also notified to go back to ED if symptoms worsen.  Pt states she is doing well today.

## 2015-01-07 NOTE — Telephone Encounter (Signed)
Please see if cardiology can see her sooner or see if their office can triage her sx.

## 2015-01-07 NOTE — Telephone Encounter (Signed)
Patient Name: Jillian Escobar  DOB: 1950/05/12    Initial Comment Caller states she needs to have an appt; heaviness in chest and hard to breathe; was told that she needs to make an appt with her Dr and Cardiologist-Cardiology appt already made   Nurse Assessment  Nurse: Raphael Gibney, RN, Vanita Ingles Date/Time (Eastern Time): 01/07/2015 9:21:36 AM  Confirm and document reason for call. If symptomatic, describe symptoms. ---Caller states she was in the ER New years Eve after passing out. BP 70/40 at that time. Hit her head when she fell. CT scan of head and chest were negative. ER told her she needs to see cardiologist. Has appt on 01/10/15 with cardiologist. BP 139/88 this am. BP 129/74 and 156/93 yesterday. Has heaviness in her chest at times. Has SOB at times.  Has the patient traveled out of the country within the last 30 days? ---Not Applicable  Does the patient have any new or worsening symptoms? ---Yes  Will a triage be completed? ---Yes  Related visit to physician within the last 2 weeks? ---Yes  Does the PT have any chronic conditions? (i.e. diabetes, asthma, etc.) ---No  Is this a behavioral health or substance abuse call? ---No     Guidelines    Guideline Title Affirmed Question Affirmed Notes  Chest Pain Difficulty breathing    Final Disposition User   Go to ED Now Raphael Gibney, RN, Vanita Ingles    Comments  Called back line at the office and spoke to [facility] and gave report that pt was at the ER on New Years Eve after passing out twice with BP of 70/40 and ER recommended that she see a cardiologist and she has appt with Dr. Caryl Comes on 01/10/2015. She has ER outcome as she is having chest heaviness and some SOB and has dizziness when she takes a deep breath. Misty said to call pt back and let her know that Dr. Regis Bill would want her to go back to the ER. Pt states she is not going back to the ER but wants call back from Dr. Velora Mediate office.   Referrals  GO TO FACILITY REFUSED   Disagree/Comply: Disagree   Disagree/Comply Reason: Disagree with instructions

## 2015-01-07 NOTE — Telephone Encounter (Signed)
Spoke with patient and she called cardiology today and Friday is the soonest they will see her.

## 2015-01-09 ENCOUNTER — Encounter: Payer: Self-pay | Admitting: Cardiology

## 2015-01-09 ENCOUNTER — Ambulatory Visit (INDEPENDENT_AMBULATORY_CARE_PROVIDER_SITE_OTHER): Payer: BC Managed Care – PPO | Admitting: Cardiology

## 2015-01-09 VITALS — BP 150/80 | Ht 63.0 in | Wt 161.2 lb

## 2015-01-09 DIAGNOSIS — H8110 Benign paroxysmal vertigo, unspecified ear: Secondary | ICD-10-CM

## 2015-01-09 DIAGNOSIS — Z8249 Family history of ischemic heart disease and other diseases of the circulatory system: Secondary | ICD-10-CM

## 2015-01-09 DIAGNOSIS — R079 Chest pain, unspecified: Secondary | ICD-10-CM | POA: Diagnosis not present

## 2015-01-09 DIAGNOSIS — R0789 Other chest pain: Secondary | ICD-10-CM | POA: Diagnosis not present

## 2015-01-09 DIAGNOSIS — R0602 Shortness of breath: Secondary | ICD-10-CM

## 2015-01-09 DIAGNOSIS — R030 Elevated blood-pressure reading, without diagnosis of hypertension: Secondary | ICD-10-CM

## 2015-01-09 DIAGNOSIS — R55 Syncope and collapse: Secondary | ICD-10-CM

## 2015-01-09 NOTE — Patient Instructions (Signed)
Your physician has requested that you have en exercise stress myoview. For further information please visit [email or web address]. Please follow instruction sheet, as given.   May return to work on 1/171/17 after results are completed.   Your physician recommends that you schedule a follow-up appointment in after myoview is completed.  If you need a refill on your cardiac medications before your next appointment, please call your pharmacy.

## 2015-01-09 NOTE — Progress Notes (Signed)
PATIENT: Jillian Escobar MRN: [id number] DOB: [birth year] PCP: Lottie Dawson, MD  Clinic Note: Chief Complaint  Patient presents with  . New Evaluation    referred by Dr. Regis Bill (PCP)//post ED for Syncope and chest pressure//still having chest heaviness and palpitations since ED.  [name] Shortness of Breath    comes and goes, mostly on exertion  . Dizziness    when she gets up//no other Sx.    HPI: Jillian Escobar is a 65 y.o. female with a PMH below who presents today for f/u of ER visit for syncope.[name] ER Visit Jan 05, 2015. Dizziness with room spinning, then passed out & hit head & back against doot. Preceded by 4 beers.  Also noted some vertigo & chest heaviness.  Has had other episodes.  Troponin Negative.   CT Angio of Chest 01/05/15: NO PE.  No Aortic dissection  Interval History: Jillian Escobar presents today here for evaluation for possible cardiac etiology of her syncopal episode. She does have a pretty significant family history of CAD. Both parents had CAD history. Thankfully, she has not had any more episodes since the 1 around [place]. Apparently the past she has had a total of 3 episodes:  First episode was for 2 years ago. She was just sitting on the couch, doing nothing. She suddenly felt lightheaded cool/clammy and just blacked out. She did Not note that she had been feeling poorly otherwise. Did not note any irregular heartbeats or palpitations.  Second episode: Last year (2015) for Christmas. She was at a dinner. She felt cool and clammy, sweaty. Mild chest discomfort but mostly that she felt nauseated with an upset stomach. At that time she was able to forestall passing out when she got outside and got a breath of fresh air.  Final episode was over [place]. He maybe had 4 beers during the course the evening, but this is not an unusual amount for her. Otherwise been feeling well. She says that she thirsty, and anxious. She got up to get something to drink from the table.  And when walking she then started feeling foggy headed, lightheaded and dizzy. She then apparently passed out. She had another episode later on that evening sitting a chair where she slumped over the side of the chair and potentially lost consciousness.  She was noted to be somewhat hypotensive in the emergency room, despite having had a diagnosis of possibly mild hypertension.  She has not noted really any significant chest discomfort associated with these episodes, but has noted some exertional chest discomfort and dyspnea over the last few months. The worst was over the week of Christmas that she felt fatigued, tired with some chest heaviness. It was somewhat consistent with potentially indigestion, so she Didn't think much about it. But it was epigastric heaviness. She does not recall having an issue of any syncopal episodes in the past during childhood etc.  Prior to her starting to feel bad in December, she used to walk on the tract daily. She says that of late, she has been feeling almost always out of breath during her walking. She thought this was related to an episode of pneumonia about a year ago. She did not note any chest discomfort however.   Cardiovascular ROS: positive for - chest pain, dyspnea on exertion, loss of consciousness and Not sure if she is having palpitations prior to passing out spells negative for - edema, murmur, orthopnea, palpitations, paroxysmal nocturnal dyspnea, rapid heart rate, shortness of breath or  TIA/amaurosis fugax. :  Past Medical History  Diagnosis Date  . GERD (gastroesophageal reflux disease)   . Hx of varicella   . Shingles 10/26/2010  . Syncope and collapse     Unclear etiology     Prior Cardiac Evaluation and Past Surgical History: Past Surgical History  Procedure Laterality Date  . Cholecystectomy    . Tubal ligation    . Exploratory laparotomy      No Known Allergies  Current Outpatient Prescriptions  Medication Sig Dispense Refill    . albuterol (PROVENTIL HFA;VENTOLIN HFA) 108 (90 BASE) MCG/ACT inhaler Inhale 2 puffs into the lungs every 6 (six) hours as needed for wheezing or shortness of breath. 1 Inhaler 2  . CALCIUM PO Take 1 tablet by mouth daily.    . Cholecalciferol (VITAMIN D PO) Take 1 tablet by mouth daily.    [name] esomeprazole (NEXIUM) 40 MG capsule Take 1 capsule (40 mg total) by mouth every other day. 90 capsule 1  . Lactobacillus (ACIDOPHILUS PO) Take 1 capsule by mouth daily.    [name] levothyroxine (SYNTHROID) 50 MCG tablet TAKE 1 TABLET DAILY 90 tablet 1  . PREMPRO 0.3-1.5 MG per tablet TAKE 1 TABLET DAILY 84 tablet 3  . VITAMIN E PO Take 1 capsule by mouth daily.     No current facility-administered medications for this visit.    Social History   Social History Narrative   H H of 2    Married, G2P1   Former tobacco; maybe 14 weeks of beer/wine per week.   Walks maybe 30 minutes or more days a week. Doesn't do more than that because she is "lazy"   Pet 2 dog and 2 cats .   TA [place] pre K and K   Originally from [place], moved over 20 years ago    family history includes Arrhythmia in her daughter; COPD in her father; Dementia in her father; Glaucoma in her father; Heart disease in her father and mother; Ulcerative colitis in her brother and sister.  ROS: A comprehensive Review of Systems - was performed. Review of Systems  Constitutional: Negative for malaise/fatigue.  Respiratory: Negative for cough.   Cardiovascular: Positive for chest pain.  Gastrointestinal: Positive for nausea (associated with prodromal symptom from passing out). Negative for blood in stool and melena.  Genitourinary: Positive for hematuria.  Neurological: Positive for dizziness and loss of consciousness. Negative for speech change, focal weakness, seizures and headaches.  Endo/Heme/Allergies: Negative.   Psychiatric/Behavioral: Negative.   All other systems reviewed and are negative.   PHYSICAL  EXAM BP 150/80 mmHg  Ht 5\' 3"  (1.6 m)  Wt 161 lb 3.2 oz (73.12 kg)  BMI 28.56 kg/m2 General appearance: alert, cooperative, appears stated age, no distress and Healthy-appearing. Normal mood and affect. HEENT: [place]/AT, EOMI, MMM, anicteric sclera Neck: no adenopathy, no carotid bruit, no JVD and supple, symmetrical, trachea midline Lungs: clear to auscultation bilaterally, normal percussion bilaterally and Nonlabored, good air movement Heart: regular rate and rhythm, S1 & S2 normal, no murmur, click, rub or gallop and normal apical impulse Abdomen: soft, non-tender; bowel sounds normal; no masses,  no organomegaly Extremities: extremities normal, atraumatic, no cyanosis or edema Pulses: 2+ and symmetric Skin: Skin color, texture, turgor normal. No rashes or lesions Neurologic: Alert and oriented X 3, normal strength and tone. Normal symmetric reflexes. Normal coordination and gait   Adult ECG Report  Rate: 74 ;  Rhythm: normal sinus rhythm  QRS Axis: -59 (LAFB) ;  PR Interval: 162 ;  QRS Duration: 84 ; QTc: 441;  Voltages: Relatively normal (computer reads LVH, maybe borderline based on aVL R-wave)  Conduction Disturbances: left anterior fascicular block  Other Abnormalities: Poor R-wave progression in precordial leads. Cannot exclude septal infarct, age undetermined.   Narrative Interpretation: Stable EKG with no change from prior  Recent Labs:  Lab Results  Component Value Date   CHOL 210* 02/11/2014   HDL 51.40 02/11/2014   LDLCALC 141* 02/11/2014   LDLDIRECT 158.4 08/14/2012   TRIG 87.0 02/11/2014   CHOLHDL 4 02/11/2014    ASSESSMENT / PLAN:  Problem List Items Addressed This Visit    Syncope (Chronic)    Hard to figure out. I don't hear any carotid bruits. No murmurs, therefore I don't think carotid Dopplers or echocardiogram would be helpful. Will get an estimation of EF with her Myoview. I'm concerned this could potentially be related arrhythmogenic, but doesn't sound like  PSVT, because she doesn't notice the rapid heartbeat sensation.  Could very well be related to dehydration associated with out all and otherwise not feeling well. Since this been 3 episodes, but over 3 years, maybe very difficult to actually figure out what causes this. Can discuss other diagnostic tests once I see her back from her stress test.      Relevant Orders   EKG 12-Lead   Myocardial Perfusion Imaging   Family history of premature CAD (Chronic)    She states that her PCP has been checking her risk factors of lipids as well as monitoring of blood pressure. Her last lipid check was back in February of last year. Borderline control based on a low risk, however with her having a family history and potentially diagnosis of hypertension, I would potentially try to shoot for LDL of at least less than 100 and total cholesterol at least less than 200.  More adjustments based on stress test results      Relevant Orders   EKG 12-Lead   Myocardial Perfusion Imaging   Exertional shortness of breath    Likely multifactorial, however I would like to exclude cardiac etiology based on her other symptoms. Treadmill Myoview.      Elevated blood pressure reading without diagnosis of hypertension    Only have one reading showing hypertension, and she was hypotensive upon evaluation for syncopal episode.  Once received the results of stress test, we can determine what her blood pressure response to exercise is and see if we need to actually start treatment.      Chest pain with moderate risk for cardiac etiology - Primary    I'm concerned that she was having some exertional dyspnea, also some chest discomfort during the week of Christmas. She has a significant family history for CAD, and has not necessarily been on any beta blockers or statins.  Because she is also having some syncopal episodes that are difficult to determine the etiology, I am concerned of possible coronary ischemia and ischemic  arrhythmias.  Plan: Treadmill Myoview -- EF abnormal, would consider echo.      Relevant Orders   EKG 12-Lead   Myocardial Perfusion Imaging   BPPV (benign paroxysmal positional vertigo)    She has had a history of BPV. I don't think this would explain her current episode of syncope.      Relevant Orders   EKG 12-Lead   Myocardial Perfusion Imaging      No orders of the defined types were placed in this encounter.  Followup: ~1 months -- after ST.    DAVID W. Ellyn Hack, M.D., M.S. Interventional Cardiolgy [facility]

## 2015-01-10 ENCOUNTER — Telehealth (HOSPITAL_COMMUNITY): Payer: Self-pay

## 2015-01-10 ENCOUNTER — Ambulatory Visit: Payer: BC Managed Care – PPO | Admitting: Physician Assistant

## 2015-01-10 NOTE — Telephone Encounter (Signed)
Encounter complete. 

## 2015-01-11 ENCOUNTER — Encounter: Payer: Self-pay | Admitting: Cardiology

## 2015-01-11 DIAGNOSIS — R030 Elevated blood-pressure reading, without diagnosis of hypertension: Secondary | ICD-10-CM

## 2015-01-11 DIAGNOSIS — I10 Essential (primary) hypertension: Secondary | ICD-10-CM | POA: Insufficient documentation

## 2015-01-11 DIAGNOSIS — R55 Syncope and collapse: Secondary | ICD-10-CM | POA: Insufficient documentation

## 2015-01-11 NOTE — Assessment & Plan Note (Signed)
She has had a history of BPV. I don't think this would explain her current episode of syncope.

## 2015-01-11 NOTE — Assessment & Plan Note (Signed)
Likely multifactorial, however I would like to exclude cardiac etiology based on her other symptoms. Treadmill Myoview.

## 2015-01-11 NOTE — Assessment & Plan Note (Signed)
I'm concerned that she was having some exertional dyspnea, also some chest discomfort during the week of Christmas. She has a significant family history for CAD, and has not necessarily been on any beta blockers or statins.  Because she is also having some syncopal episodes that are difficult to determine the etiology, I am concerned of possible coronary ischemia and ischemic arrhythmias.  Plan: Treadmill Myoview -- EF abnormal, would consider echo.

## 2015-01-11 NOTE — Assessment & Plan Note (Signed)
She states that her PCP has been checking her risk factors of lipids as well as monitoring of blood pressure. Her last lipid check was back in February of last year. Borderline control based on a low risk, however with her having a family history and potentially diagnosis of hypertension, I would potentially try to shoot for LDL of at least less than 100 and total cholesterol at least less than 200.  More adjustments based on stress test results

## 2015-01-11 NOTE — Assessment & Plan Note (Signed)
Hard to figure out. I don't hear any carotid bruits. No murmurs, therefore I don't think carotid Dopplers or echocardiogram would be helpful. Will get an estimation of EF with her Myoview. I'm concerned this could potentially be related arrhythmogenic, but doesn't sound like PSVT, because she doesn't notice the rapid heartbeat sensation.  Could very well be related to dehydration associated with out all and otherwise not feeling well. Since this been 3 episodes, but over 3 years, maybe very difficult to actually figure out what causes this. Can discuss other diagnostic tests once I see her back from her stress test.

## 2015-01-11 NOTE — Assessment & Plan Note (Signed)
Only have one reading showing hypertension, and she was hypotensive upon evaluation for syncopal episode.  Once received the results of stress test, we can determine what her blood pressure response to exercise is and see if we need to actually start treatment.

## 2015-01-14 ENCOUNTER — Ambulatory Visit (HOSPITAL_COMMUNITY)
Admission: RE | Admit: 2015-01-14 | Discharge: 2015-01-14 | Disposition: A | Discharge status: Home / Self Care | Payer: BC Managed Care – PPO | Source: Ambulatory Visit | Attending: Cardiology | Admitting: Cardiology

## 2015-01-14 DIAGNOSIS — Z87891 Personal history of nicotine dependence: Secondary | ICD-10-CM | POA: Diagnosis not present

## 2015-01-14 DIAGNOSIS — R079 Chest pain, unspecified: Secondary | ICD-10-CM | POA: Diagnosis not present

## 2015-01-14 DIAGNOSIS — R0602 Shortness of breath: Secondary | ICD-10-CM | POA: Insufficient documentation

## 2015-01-14 DIAGNOSIS — R5383 Other fatigue: Secondary | ICD-10-CM | POA: Diagnosis not present

## 2015-01-14 DIAGNOSIS — R0609 Other forms of dyspnea: Secondary | ICD-10-CM | POA: Insufficient documentation

## 2015-01-14 DIAGNOSIS — R55 Syncope and collapse: Secondary | ICD-10-CM | POA: Diagnosis present

## 2015-01-14 DIAGNOSIS — H8110 Benign paroxysmal vertigo, unspecified ear: Secondary | ICD-10-CM | POA: Diagnosis not present

## 2015-01-14 DIAGNOSIS — Z8249 Family history of ischemic heart disease and other diseases of the circulatory system: Secondary | ICD-10-CM | POA: Diagnosis not present

## 2015-01-14 DIAGNOSIS — R42 Dizziness and giddiness: Secondary | ICD-10-CM | POA: Insufficient documentation

## 2015-01-14 DIAGNOSIS — R002 Palpitations: Secondary | ICD-10-CM | POA: Diagnosis not present

## 2015-01-14 LAB — MYOCARDIAL PERFUSION IMAGING
CHL CUP NUCLEAR SRS: 0
CHL CUP NUCLEAR SSS: 1
CSEPED: 7 min
CSEPHR: 91 %
Estimated workload: 9.5 METS
Exercise duration (sec): 41 s
LV dias vol: 67 mL
LV sys vol: 20 mL
MPHR: 158 {beats}/min
NUC STRESS TID: 1.1
Peak HR: 144 {beats}/min
RPE: 17
Rest HR: 63 {beats}/min
SDS: 1

## 2015-01-14 MED ORDER — TECHNETIUM TC 99M SESTAMIBI GENERIC - CARDIOLITE
10.8000 | Freq: Once | INTRAVENOUS | Status: AC | PRN
  Administered 2015-01-14: 11 via INTRAVENOUS

## 2015-01-14 MED ORDER — TECHNETIUM TC 99M SESTAMIBI GENERIC - CARDIOLITE
30.2000 | Freq: Once | INTRAVENOUS | Status: AC | PRN
  Administered 2015-01-14: 30.2 via INTRAVENOUS

## 2015-01-15 ENCOUNTER — Ambulatory Visit (INDEPENDENT_AMBULATORY_CARE_PROVIDER_SITE_OTHER): Payer: BC Managed Care – PPO | Admitting: Internal Medicine

## 2015-01-15 ENCOUNTER — Encounter: Payer: Self-pay | Admitting: Internal Medicine

## 2015-01-15 ENCOUNTER — Telehealth: Payer: Self-pay | Admitting: *Deleted

## 2015-01-15 ENCOUNTER — Encounter: Payer: Self-pay | Admitting: Family Medicine

## 2015-01-15 VITALS — BP 142/86 | HR 84 | Temp 97.9°F | Wt 172.1 lb

## 2015-01-15 DIAGNOSIS — R030 Elevated blood-pressure reading, without diagnosis of hypertension: Secondary | ICD-10-CM | POA: Diagnosis not present

## 2015-01-15 DIAGNOSIS — R55 Syncope and collapse: Secondary | ICD-10-CM

## 2015-01-15 DIAGNOSIS — R0609 Other forms of dyspnea: Secondary | ICD-10-CM | POA: Diagnosis not present

## 2015-01-15 DIAGNOSIS — IMO0001 Reserved for inherently not codable concepts without codable children: Secondary | ICD-10-CM

## 2015-01-15 DIAGNOSIS — R079 Chest pain, unspecified: Secondary | ICD-10-CM | POA: Diagnosis not present

## 2015-01-15 DIAGNOSIS — Z8249 Family history of ischemic heart disease and other diseases of the circulatory system: Secondary | ICD-10-CM | POA: Insufficient documentation

## 2015-01-15 NOTE — Progress Notes (Signed)
Pre visit review using our clinic review tool, if applicable. No additional management support is needed unless otherwise documented below in the visit note.  Chief Complaint  Patient presents with  . ED Follow Up    HPI: Jillian Escobar 65 y.o. comes in for  fu ed visit for hypotension and near syncope  Recurrent sx x 3   eval byu cardiology and just had   Nuclear Stress test .    Reassuring  Has fu appt feb 9 or earlier if cancellation. Consideration of  Getting hr monitor . Cannot explain why all happened  Has been feeling  somewhat winded  Breath and light headed.  At times   All the time  Worse when walking .   ? Anxious now to exercise .    Not right  Well.  Did sleep well that day .    Heaviness in mid chest .  For the last month or so .  doesnt rellay feels depressed but uncertain is anxiety could aggravated. On hrt  Husband had to stay hom e with her  For about 10 days Jan 2- return  jan 11 .   She was told to not be alone  Needs fmla form from husband and for her  Out Jan 4th to return Jan 25th  ROS: See pertinent positives and negatives per HPI. No fver chills hemoptysis  Panic attacks   Past Medical History  Diagnosis Date  . GERD (gastroesophageal reflux disease)   . Hx of varicella   . Shingles 10/26/2010  . Syncope and collapse     Unclear etiology     Family History  Problem Relation Age of Onset  . Heart disease Mother     stent in 62 s  . COPD Father   . Heart disease Father     cabg in 14s   . Glaucoma Father   . Dementia Father   . Ulcerative colitis Sister   . Ulcerative colitis Brother   . Arrhythmia Daughter     Social History   Social History  . Marital Status: Married    Spouse Name: N/A  . Number of Children: N/A  . Years of Education: N/A   Social History Main Topics  . Smoking status: Former Smoker    Quit date: 01/04/1982  . Smokeless tobacco: None  . Alcohol Use: Yes  . Drug Use: No  . Sexual Activity: Not Asked   Other Topics  Concern  . None   Social History Narrative   H H of 2    Married, G2P1   Former tobacco; maybe 14 weeks of beer/wine per week.   Walks maybe 30 minutes or more days a week. Doesn't do more than that because she is "lazy"   Pet 2 dog and 2 cats .   TA [place] pre K and K   Originally from [place], moved over 20 years ago    Outpatient Prescriptions Prior to Visit  Medication Sig Dispense Refill  . albuterol (PROVENTIL HFA;VENTOLIN HFA) 108 (90 BASE) MCG/ACT inhaler Inhale 2 puffs into the lungs every 6 (six) hours as needed for wheezing or shortness of breath. 1 Inhaler 2  . CALCIUM PO Take 1 tablet by mouth daily.    . Cholecalciferol (VITAMIN D PO) Take 1 tablet by mouth daily.    [name] esomeprazole (NEXIUM) 40 MG capsule Take 1 capsule (40 mg total) by mouth every other day. 90 capsule 1  . Lactobacillus (ACIDOPHILUS  PO) Take 1 capsule by mouth daily.    [name] levothyroxine (SYNTHROID) 50 MCG tablet TAKE 1 TABLET DAILY 90 tablet 1  . PREMPRO 0.3-1.5 MG per tablet TAKE 1 TABLET DAILY 84 tablet 3  . VITAMIN E PO Take 1 capsule by mouth daily.     No facility-administered medications prior to visit.     EXAM:  BP 142/86 mmHg  Pulse 84  Temp(Src) 97.9 F (36.6 C) (Oral)  Wt 172 lb 1.6 oz (78.064 kg)  Body mass index is 30.49 kg/(m^2).  GENERAL: vitals reviewed and listed above, alert, oriented, appears well hydrated and in no acute distress HEENT: atraumatic, conjunctiva  clear, no obvious abnormalities on inspection of external nose and ears  NECK: no obvious masses on inspection palpation  LUNGS: clear to auscultation bilaterally, no wheezes, rales or rhonchi CV: HRRR, no clubbing cyanosis or  peripheral edema nl cap refill  Abdomen:  Sof,t normal bowel sounds without hepatosplenomegaly, no guarding rebound or masses no CVA tenderness MS: moves all extremities without noticeable focal  abnormality PSYCH: pleasant and cooperative, no obvious depression or  anxiety See record review  ASSESSMENT AND PLAN:  Discussed the following assessment and plan:  Syncope and collapse - uncertain cause  some atypical features  complete cards eval .   Chest pain with moderate risk for cardiac etiology  Dyspnea on exertion - with chest sx for about 1-2 months  get pfts    Elevated BPreading  - monitor have cards consider add med if approp but with hypotension hx will follow for now Get pfts  To check pulm function .  Anxiety could be secondary but dosent seem to be the initial event . Has had chest ct  And no PE noted . No evidence of cardial damage at this time.  Do fmla forms one for patient and one for  Spouse  -Patient advised to return or notify health care team  if symptoms worsen ,persist or new concerns arise. Total visit 92mins > 50% spent counseling and coordinating care as indicated in above note and in instructions to patient . Form evaluation and plan     Patient Instructions   Plan lung function tests .  As we discussed .  Continue   Cardiology evaluation.   In the interim .   BP Readings from Last 3 Encounters:  01/15/15 142/86  01/09/15 150/80  01/05/15 129/71   Make sure bp is in range  .  Below 140/90   Will do   fmla forms   This week .  activity as tolerated in the interim.    And then plan follow up.      Standley Brooking. Panosh M.D.

## 2015-01-15 NOTE — Telephone Encounter (Signed)
Spoke to patient. Result given . Verbalized understanding  PATIENT WANTED TO KNOW, IF OKAY TO TRAVEL TO [place] ON 02/19/15. SHE HAS AN APPOINTMENT WITH YOU 02/13/15

## 2015-01-15 NOTE — Telephone Encounter (Signed)
-----   Message from Leonie Man, MD sent at 01/15/2015  9:29 AM EST ----- Doristine Devoid news - Stress Test is Normal.   No sign of decreased blood flow from Coronary Artery Disease!! Normal Pump function & good exercise tolerance.  Leonie Man, MD

## 2015-01-15 NOTE — Patient Instructions (Signed)
Plan lung function tests .  As we discussed .  Continue   Cardiology evaluation.   In the interim .   BP Readings from Last 3 Encounters:  01/15/15 142/86  01/09/15 150/80  01/05/15 129/71   Make sure bp is in range  .  Below 140/90   Will do   fmla forms   This week .  activity as tolerated in the interim.    And then plan follow up.

## 2015-01-16 NOTE — Telephone Encounter (Signed)
Yes - based upon these results, should be OK to travel.  Leonie Man, MD

## 2015-01-16 NOTE — Telephone Encounter (Signed)
INFORMATION GIVEN TO PATIENT. PATIENT WOULD LIKE TO COME SOONER - STILL LITLE DIZZY AT TIMES APPOINTMENT AVAILABLE 01/21/15

## 2015-01-20 ENCOUNTER — Ambulatory Visit (INDEPENDENT_AMBULATORY_CARE_PROVIDER_SITE_OTHER): Payer: BC Managed Care – PPO | Admitting: Internal Medicine

## 2015-01-20 DIAGNOSIS — R55 Syncope and collapse: Secondary | ICD-10-CM

## 2015-01-20 DIAGNOSIS — R0609 Other forms of dyspnea: Secondary | ICD-10-CM

## 2015-01-20 LAB — PULMONARY FUNCTION TEST
DL/VA % PRED: 97 %
DL/VA: 4.7 ml/min/mmHg/L
DLCO UNC: 21.72 ml/min/mmHg
DLCO unc % pred: 89 %
FEF 25-75 POST: 2.18 L/s
FEF 25-75 Pre: 1.63 L/sec
FEF2575-%CHANGE-POST: 34 %
FEF2575-%PRED-POST: 97 %
FEF2575-%Pred-Pre: 72 %
FEV1-%CHANGE-POST: 5 %
FEV1-%PRED-PRE: 87 %
FEV1-%Pred-Post: 92 %
FEV1-Post: 2.3 L
FEV1-Pre: 2.18 L
FEV1FVC-%CHANGE-POST: 1 %
FEV1FVC-%PRED-PRE: 97 %
FEV6-%Change-Post: 3 %
FEV6-%Pred-Post: 95 %
FEV6-%Pred-Pre: 92 %
FEV6-POST: 2.99 L
FEV6-Pre: 2.88 L
FEV6FVC-%PRED-POST: 104 %
FEV6FVC-%Pred-Pre: 104 %
FVC-%CHANGE-POST: 3 %
FVC-%PRED-POST: 92 %
FVC-%PRED-PRE: 88 %
FVC-POST: 2.99 L
FVC-PRE: 2.88 L
POST FEV1/FVC RATIO: 77 %
PRE FEV1/FVC RATIO: 76 %
PRE FEV6/FVC RATIO: 100 %
Post FEV6/FVC ratio: 100 %
RV % PRED: 104 %
RV: 2.11 L
TLC % pred: 101 %
TLC: 5.13 L

## 2015-01-20 NOTE — Progress Notes (Signed)
PFT done today. 

## 2015-01-21 ENCOUNTER — Ambulatory Visit (INDEPENDENT_AMBULATORY_CARE_PROVIDER_SITE_OTHER): Payer: BC Managed Care – PPO | Admitting: Cardiology

## 2015-01-21 ENCOUNTER — Encounter: Payer: Self-pay | Admitting: Cardiology

## 2015-01-21 VITALS — BP 137/87 | HR 71 | Ht 64.0 in | Wt 172.4 lb

## 2015-01-21 DIAGNOSIS — R0602 Shortness of breath: Secondary | ICD-10-CM | POA: Diagnosis not present

## 2015-01-21 DIAGNOSIS — R55 Syncope and collapse: Secondary | ICD-10-CM

## 2015-01-21 DIAGNOSIS — R030 Elevated blood-pressure reading, without diagnosis of hypertension: Secondary | ICD-10-CM | POA: Diagnosis not present

## 2015-01-21 DIAGNOSIS — R079 Chest pain, unspecified: Secondary | ICD-10-CM

## 2015-01-21 NOTE — Patient Instructions (Signed)
OKAY TO GO TO [place]- KEEP HYDRATED  Your physician wants you to follow-up in 6-8 MONTHS WITH DR Ellyn Hack  You will receive a reminder letter in the mail two months in advance. If you don't receive a letter, please call our office to schedule the follow-up appointment.   PRIMARY WILL FOLLOW UP ON ALL OTHER  ISSUES.

## 2015-01-21 NOTE — Progress Notes (Signed)
PCP: Lottie Dawson, MD  Clinic Note: Chief Complaint  Patient presents with  . Follow-up    Post stress test  . Fatigue    HPI: Jillian Escobar is a 65 y.o. female with a PMH below who presents today for close follow-up to discuss results of Myoview.  Jillian Escobar was last seen on January 5 following an episode of the over the Christmas holidays. She is also had some chest pressure and exertional dyspnea and fatigue. There was no symptoms to suggest arrhythmia, therefore we did not do a monitor. I did do a treadmill nuclear stress test to evaluate for potential ischemic etiology.  Recent Hospitalizations: N/A  Studies Reviewed:  Myoview:  01/14/15  Study Highlights     Nuclear stress EF: 71%.  The left ventricular ejection fraction is hyperdynamic (>65%).  There was no ST segment deviation noted during stress.  The study is normal.  This is a low risk study.  Normal exercise nuclear study demonstrating normal myocardial perfusion and function, EF 71% post stress. The patient exercised to a peak HR of 144 ((91% APMHR) and workload of 9.5 mets. Duke treadmill score 9.    Interval History: Jillian Escobar presents today really doing relatively well, with no recurrent chest discomfort or pressure.  She has noted though still feeling a little bit that he and lack of energy. She feels a sense of "fatigue" in her chest, but not pressure or tightness. She just feels tired all time and "doesn't have a taste for beer anymore ".  She hasn't noticed any significant palpitations more than fleeting spells. Nothing to suggest an arrhythmia.  When she feels that having a severe chest and dyspnea is more associated with the end of the day if she is trying to activities. No exertional dyspnea.   No PND, orthopnea or edema.  No lightheadedness, dizziness, weakness or syncope/near syncope. No TIA/amaurosis fugax symptoms. No claudication.  ROS: A comprehensive was performed. Review of  Systems  Constitutional: Positive for malaise/fatigue.  HENT: Negative for nosebleeds.   Respiratory: Negative for cough and shortness of breath.   Cardiovascular: Negative.        Per history of present illness  Gastrointestinal: Negative for blood in stool and melena.  Genitourinary: Negative for hematuria.  Musculoskeletal: Negative for myalgias and falls.  Neurological: Negative for dizziness and loss of consciousness.       No further loss of consciousness or dizziness.  Psychiatric/Behavioral: Negative.  Negative for depression.  All other systems reviewed and are negative.  Past Medical History  Diagnosis Date  . GERD (gastroesophageal reflux disease)   . Hx of varicella   . Shingles 10/26/2010  . Syncope and collapse     Unclear etiology     Past Surgical History  Procedure Laterality Date  . Cholecystectomy    . Tubal ligation    . Exploratory laparotomy     Prior to Admission medications   Medication Sig Start Date End Date Taking? Authorizing Provider  albuterol (PROVENTIL HFA;VENTOLIN HFA) 108 (90 BASE) MCG/ACT inhaler Inhale 2 puffs into the lungs every 6 (six) hours as needed for wheezing or shortness of breath. 12/04/13  Yes Burnis Medin, MD  CALCIUM PO Take 1 tablet by mouth daily.   Yes Historical Provider, MD  Cholecalciferol (VITAMIN D PO) Take 1 tablet by mouth daily.   Yes Historical Provider, MD  esomeprazole (NEXIUM) 40 MG capsule Take 1 capsule (40 mg total) by mouth every other day. 11/16/13  Yes Burnis Medin, MD  Lactobacillus (ACIDOPHILUS PO) Take 1 capsule by mouth daily.   Yes Historical Provider, MD  levothyroxine (SYNTHROID) 50 MCG tablet TAKE 1 TABLET DAILY 11/06/14  Yes Burnis Medin, MD  PREMPRO 0.3-1.5 MG per tablet TAKE 1 TABLET DAILY 01/28/14  Yes Burnis Medin, MD  VITAMIN E PO Take 1 capsule by mouth daily.   Yes Historical Provider, MD   No Known Allergies  Social History   Social History  . Marital Status: Married    Spouse  Name: N/A  . Number of Children: N/A  . Years of Education: N/A   Social History Main Topics  . Smoking status: Former Smoker    Quit date: 01/04/1982  . Smokeless tobacco: None  . Alcohol Use: 0.0 oz/week    0 Standard drinks or equivalent per week  . Drug Use: No  . Sexual Activity: Not Asked   Other Topics Concern  . None   Social History Narrative   H H of 2    Married, G2P1   Former tobacco; maybe 14 weeks of beer/wine per week.   Walks maybe 30 minutes or more days a week. Doesn't do more than that because she is "lazy"   Pet 2 dog and 2 cats .   TA [place] [employer]   Originally from [place], moved over 20 years ago   Family History  Problem Relation Age of Onset  . Heart disease Mother     stent in 82 s  . COPD Father   . Heart disease Father     cabg in 64s   . Glaucoma Father   . Dementia Father   . Ulcerative colitis Sister   . Ulcerative colitis Brother   . Arrhythmia Daughter     Wt Readings from Last 3 Encounters:  01/21/15 172 lb 6.4 oz (78.2 kg)  01/15/15 172 lb 1.6 oz (78.064 kg)  01/14/15 161 lb (73.029 kg)    PHYSICAL EXAM BP 137/87 mmHg  Pulse 71  Ht 5\' 4"  (1.626 m)  Wt 172 lb 6.4 oz (78.2 kg)  BMI 29.58 kg/m2 General appearance: alert, cooperative, appears stated age, no distress and Healthy-appearing. Normal mood and affect. HEENT: [place]/AT, EOMI, MMM, anicteric sclera Neck: no adenopathy, no carotid bruit, no JVD and supple, symmetrical, trachea midline Lungs: CTA B, normal percussion bilaterally and Nonlabored, good air movement Heart: RRR, S1 & S2 normal, no murmur, click, rub or gallop and normal apical impulse Abdomen: soft, non-tender; bowel sounds normal; no masses, no organomegaly Extremities: extremities normal, atraumatic, no cyanosis or edema Pulses: 2+ and symmetric Skin: Skin color, texture, turgor normal. No rashes or lesions Neurologic: Alert and oriented X 3, grossly normal   Adult ECG Report -  not checked  Other studies Reviewed: Additional studies/ records that were reviewed today include:  Recent Labs:  n/a    ASSESSMENT / PLAN: Problem List Items Addressed This Visit    Syncope and collapse - Primary    Very unclear as to what the true etiology was. She notes some faint palpitations, but nothing prolonged and did not notice this when she had her passout spell. Probably was neurocardiogenic in nature based on maybe being dehydrated. We talked but adequate hydration -- clear urine as the determining factor. Also discussed vagal maneuvers in case this is PSVT.  Would consider more detailed evaluation, if she has more episodes.      Exertional shortness of breath  With negative Myoview, low likelihood of this being a cardiac etiology. I think it may be related more to deconditioning. I encouraged her to continue to build back up her level activity. Should have no wheezing for not going to [place] on her upcoming trip.      Elevated blood pressure reading without diagnosis of hypertension (Chronic)    Borderline blood pressure today. Would continue to monitor. I think I would be reluctant to be aggressive with treating her blood pressures until we have documentation of persistent elevated blood pressures.      Chest pain with low risk for cardiac etiology    Stress test looked relatively normal. Low risk. Therefore low likelihood of this being anginal type chest pain. Also low likely hood of ischemic arrhythmia. Normal LV function by Myoview would argue against structural abnormalities well.         Current medicines are reviewed at length with the patient today. (+/- concerns) none The following changes have been made: None  OKAY TO GO TO [place]- KEEP HYDRATED  Your physician wants you to follow-up in 6-8 MONTHS WITH DR HARDING  Studies Ordered:   No orders of the defined types were placed in this encounter.      Leonie Man, M.D., M.S. Interventional  Cardiologist   Pager # [phone]

## 2015-01-23 ENCOUNTER — Encounter: Payer: Self-pay | Admitting: Cardiology

## 2015-01-23 NOTE — Assessment & Plan Note (Addendum)
Stress test looked relatively normal. Low risk. Therefore low likelihood of this being anginal type chest pain. Also low likely hood of ischemic arrhythmia. Normal LV function by Myoview would argue against structural abnormalities well.

## 2015-01-23 NOTE — Assessment & Plan Note (Signed)
Very unclear as to what the true etiology was. She notes some faint palpitations, but nothing prolonged and did not notice this when she had her passout spell. Probably was neurocardiogenic in nature based on maybe being dehydrated. We talked but adequate hydration -- clear urine as the determining factor. Also discussed vagal maneuvers in case this is PSVT.  Would consider more detailed evaluation, if she has more episodes.

## 2015-01-23 NOTE — Assessment & Plan Note (Signed)
Borderline blood pressure today. Would continue to monitor. I think I would be reluctant to be aggressive with treating her blood pressures until we have documentation of persistent elevated blood pressures.

## 2015-01-23 NOTE — Assessment & Plan Note (Signed)
With negative Myoview, low likelihood of this being a cardiac etiology. I think it may be related more to deconditioning. I encouraged her to continue to build back up her level activity. Should have no wheezing for not going to [place] on her upcoming trip.

## 2015-01-24 ENCOUNTER — Telehealth: Payer: Self-pay | Admitting: Internal Medicine

## 2015-01-24 NOTE — Telephone Encounter (Signed)
Waiting on Misty/Dr Panosh's response

## 2015-01-24 NOTE — Telephone Encounter (Signed)
Letter faxed and confirmation placed on Misty' desk.

## 2015-01-24 NOTE — Telephone Encounter (Signed)
Pt needs a work note to return to work  On 01-29-15 without any restrictions fax to [employer] [phone]

## 2015-02-10 ENCOUNTER — Other Ambulatory Visit: Payer: Self-pay | Admitting: Internal Medicine

## 2015-02-11 NOTE — Telephone Encounter (Signed)
Sent to the pharmacy by e-scribe. 

## 2015-02-13 ENCOUNTER — Ambulatory Visit: Payer: BC Managed Care – PPO | Admitting: Cardiology

## 2015-02-19 ENCOUNTER — Telehealth: Payer: Self-pay | Admitting: Internal Medicine

## 2015-02-19 NOTE — Telephone Encounter (Signed)
[facility] [place] [facility] Patient Name: Jillian Escobar DOB: Sep 28, 1950 Initial Comment Caller states woke up with sore throat and runny nose, will be leaving out of the country in about an hour Nurse Assessment Nurse: Mechele Dawley, RN, Amy Date/Time (Eastern Time): 02/19/2015 11:50:07 AM Confirm and document reason for call. If symptomatic, describe symptoms. You must click the next button to save text entered. ---Sore throat, runny nose, dry cough. She just woke up this morning like this. She states she is about to go out of the country in about an hour. She wanted to know if she can get something called in for her at the [facility]. Tried to get her an appt and she states that she does not have time for that. She states that she leaves at 1230. Has the patient traveled out of the country within the last 30 days? ---Not Applicable Does the patient have any new or worsening symptoms? ---Yes Will a triage be completed? ---Yes Related visit to physician within the last 2 weeks? ---No Does the PT have any chronic conditions? (i.e. diabetes, asthma, etc.) ---Yes List chronic conditions. ---please see epic Is this a behavioral health or substance abuse call? ---No Guidelines Guideline Title Affirmed Question Affirmed Notes Cough - Acute Non-Productive Cough with cold symptoms (e.g., runny nose, postnasal drip, throat clearing) (all triage questions negative) Final Disposition [place], RN, Amy Comments PATIENT IS LEAVING GOING OUT OF COUNTRY IN AN HOUR AND SHE WANTS MEDS CALLED IN FOR HER. Referrals REFERRED TO PCP OFFICE Disagree/Comply: Comply

## 2015-02-19 NOTE — Telephone Encounter (Signed)
Please advise 

## 2015-02-19 NOTE — Telephone Encounter (Signed)
Spoke to the pt.  She was at the airport.  Advised to follow up when she got home if needed.  Pt headed to [place].

## 2015-03-03 ENCOUNTER — Other Ambulatory Visit (INDEPENDENT_AMBULATORY_CARE_PROVIDER_SITE_OTHER): Payer: BC Managed Care – PPO

## 2015-03-03 DIAGNOSIS — Z0000 Encounter for general adult medical examination without abnormal findings: Secondary | ICD-10-CM | POA: Diagnosis not present

## 2015-03-03 LAB — TSH: TSH: 1.79 u[IU]/mL (ref 0.35–4.50)

## 2015-03-03 LAB — CBC WITH DIFFERENTIAL/PLATELET
BASOS PCT: 0.8 % (ref 0.0–3.0)
Basophils Absolute: 0 10*3/uL (ref 0.0–0.1)
EOS ABS: 0.2 10*3/uL (ref 0.0–0.7)
Eosinophils Relative: 3.1 % (ref 0.0–5.0)
HEMATOCRIT: 42.6 % (ref 36.0–46.0)
HEMOGLOBIN: 14.5 g/dL (ref 12.0–15.0)
LYMPHS PCT: 36.8 % (ref 12.0–46.0)
Lymphs Abs: 2 10*3/uL (ref 0.7–4.0)
MCHC: 34 g/dL (ref 30.0–36.0)
MCV: 89.2 fl (ref 78.0–100.0)
MONO ABS: 0.4 10*3/uL (ref 0.1–1.0)
Monocytes Relative: 8.3 % (ref 3.0–12.0)
Neutro Abs: 2.7 10*3/uL (ref 1.4–7.7)
Neutrophils Relative %: 51 % (ref 43.0–77.0)
Platelets: 380 10*3/uL (ref 150.0–400.0)
RBC: 4.77 Mil/uL (ref 3.87–5.11)
RDW: 12.8 % (ref 11.5–15.5)
WBC: 5.4 10*3/uL (ref 4.0–10.5)

## 2015-03-03 LAB — BASIC METABOLIC PANEL WITH GFR
BUN: 16 mg/dL (ref 6–23)
CO2: 29 meq/L (ref 19–32)
Calcium: 9.4 mg/dL (ref 8.4–10.5)
Chloride: 107 meq/L (ref 96–112)
Creatinine, Ser: 0.87 mg/dL (ref 0.40–1.20)
GFR: 70.02 mL/min
Glucose, Bld: 102 mg/dL — ABNORMAL HIGH (ref 70–99)
Potassium: 4.6 meq/L (ref 3.5–5.1)
Sodium: 142 meq/L (ref 135–145)

## 2015-03-03 LAB — HEPATIC FUNCTION PANEL
ALK PHOS: 53 U/L (ref 39–117)
ALT: 10 U/L (ref 0–35)
AST: 13 U/L (ref 0–37)
Albumin: 4.1 g/dL (ref 3.5–5.2)
BILIRUBIN DIRECT: 0.1 mg/dL (ref 0.0–0.3)
BILIRUBIN TOTAL: 0.4 mg/dL (ref 0.2–1.2)
Total Protein: 6.9 g/dL (ref 6.0–8.3)

## 2015-03-03 LAB — LIPID PANEL
Cholesterol: 180 mg/dL (ref 0–200)
HDL: 46 mg/dL
LDL Cholesterol: 119 mg/dL — ABNORMAL HIGH (ref 0–99)
NonHDL: 133.91
Total CHOL/HDL Ratio: 4
Triglycerides: 74 mg/dL (ref 0.0–149.0)
VLDL: 14.8 mg/dL (ref 0.0–40.0)

## 2015-03-10 ENCOUNTER — Encounter: Payer: Self-pay | Admitting: Internal Medicine

## 2015-03-10 ENCOUNTER — Ambulatory Visit (INDEPENDENT_AMBULATORY_CARE_PROVIDER_SITE_OTHER): Payer: BC Managed Care – PPO | Admitting: Internal Medicine

## 2015-03-10 VITALS — BP 124/76 | Temp 98.0°F | Ht 63.0 in | Wt 171.5 lb

## 2015-03-10 DIAGNOSIS — R7301 Impaired fasting glucose: Secondary | ICD-10-CM

## 2015-03-10 DIAGNOSIS — Z0000 Encounter for general adult medical examination without abnormal findings: Secondary | ICD-10-CM

## 2015-03-10 DIAGNOSIS — Z79890 Hormone replacement therapy (postmenopausal): Secondary | ICD-10-CM

## 2015-03-10 DIAGNOSIS — E039 Hypothyroidism, unspecified: Secondary | ICD-10-CM

## 2015-03-10 DIAGNOSIS — Z79899 Other long term (current) drug therapy: Secondary | ICD-10-CM

## 2015-03-10 MED ORDER — CONJ ESTROG-MEDROXYPROGEST ACE 0.3-1.5 MG PO TABS: 1.0000 | ORAL_TABLET | Freq: Every day | ORAL | Status: DC

## 2015-03-10 NOTE — Patient Instructions (Signed)
Decrease  2 days per week of homone therapy For a month or so  then decrease  to off  3 days per week  To  Limit  Hormone  Total.  If getting lightheaded  can try checking pulse if  for rate and regularity.  You labs are much better . Stay hydrated  Before walking    Health Maintenance, Female Adopting a healthy lifestyle and getting preventive care can go a long way to promote health and wellness. Talk with your health care provider about what schedule of regular examinations is right for you. This is a good chance for you to check in with your provider about disease prevention and staying healthy. In between checkups, there are plenty of things you can do on your own. Experts have done a lot of research about which lifestyle changes and preventive measures are most likely to keep you healthy. Ask your health care provider for more information. WEIGHT AND DIET  Eat a healthy diet  Be sure to include plenty of vegetables, fruits, low-fat dairy products, and lean protein.  Do not eat a lot of foods high in solid fats, added sugars, or salt.  Get regular exercise. This is one of the most important things you can do for your health.  Most adults should exercise for at least 150 minutes each week. The exercise should increase your heart rate and make you sweat (moderate-intensity exercise).  Most adults should also do strengthening exercises at least twice a week. This is in addition to the moderate-intensity exercise.  Maintain a healthy weight  Body mass index (BMI) is a measurement that can be used to identify possible weight problems. It estimates body fat based on height and weight. Your health care provider can help determine your BMI and help you achieve or maintain a healthy weight.  For females 63 years of age and older:   A BMI below 18.5 is considered underweight.  A BMI of 18.5 to 24.9 is normal.  A BMI of 25 to 29.9 is considered overweight.  A BMI of 30 and above is  considered obese.  Watch levels of cholesterol and blood lipids  You should start having your blood tested for lipids and cholesterol at 63 years of age, then have this test every 5 years.  You may need to have your cholesterol levels checked more often if:  Your lipid or cholesterol levels are high.  You are older than 63 years of age.  You are at high risk for heart disease.  CANCER SCREENING   Lung Cancer  Lung cancer screening is recommended for adults 2-46 years old who are at high risk for lung cancer because of a history of smoking.  A yearly low-dose CT scan of the lungs is recommended for people who:  Currently smoke.  Have quit within the past 15 years.  Have at least a 30-pack-year history of smoking. A pack year is smoking an average of one pack of cigarettes a day for 1 year.  Yearly screening should continue until it has been 15 years since you quit.  Yearly screening should stop if you develop a health problem that would prevent you from having lung cancer treatment.  Breast Cancer  Practice breast self-awareness. This means understanding how your breasts normally appear and feel.  It also means doing regular breast self-exams. Let your health care provider know about any changes, no matter how small.  If you are in your 20s or 30s, you should have  a clinical breast exam (CBE) by a health care provider every 1-3 years as part of a regular health exam.  If you are 62 or older, have a CBE every year. Also consider having a breast X-ray (mammogram) every year.  If you have a family history of breast cancer, talk to your health care provider about genetic screening.  If you are at high risk for breast cancer, talk to your health care provider about having an MRI and a mammogram every year.  Breast cancer gene (BRCA) assessment is recommended for women who have family members with BRCA-related cancers. BRCA-related cancers  include:  Breast.  Ovarian.  Tubal.  Peritoneal cancers.  Results of the assessment will determine the need for genetic counseling and BRCA1 and BRCA2 testing. Cervical Cancer Your health care provider may recommend that you be screened regularly for cancer of the pelvic organs (ovaries, uterus, and vagina). This screening involves a pelvic examination, including checking for microscopic changes to the surface of your cervix (Pap test). You may be encouraged to have this screening done every 3 years, beginning at age 27.  For women ages 85-65, health care providers may recommend pelvic exams and Pap testing every 3 years, or they may recommend the Pap and pelvic exam, combined with testing for human papilloma virus (HPV), every 5 years. Some types of HPV increase your risk of cervical cancer. Testing for HPV may also be done on women of any age with unclear Pap test results.  Other health care providers may not recommend any screening for nonpregnant women who are considered low risk for pelvic cancer and who do not have symptoms. Ask your health care provider if a screening pelvic exam is right for you.  If you have had past treatment for cervical cancer or a condition that could lead to cancer, you need Pap tests and screening for cancer for at least 20 years after your treatment. If Pap tests have been discontinued, your risk factors (such as having a new sexual partner) need to be reassessed to determine if screening should resume. Some women have medical problems that increase the chance of getting cervical cancer. In these cases, your health care provider may recommend more frequent screening and Pap tests. Colorectal Cancer  This type of cancer can be detected and often prevented.  Routine colorectal cancer screening usually begins at 63 years of age and continues through 63 years of age.  Your health care provider may recommend screening at an earlier age if you have risk factors for  colon cancer.  Your health care provider may also recommend using home test kits to check for hidden blood in the stool.  A small camera at the end of a tube can be used to examine your colon directly (sigmoidoscopy or colonoscopy). This is done to check for the earliest forms of colorectal cancer.  Routine screening usually begins at age 39.  Direct examination of the colon should be repeated every 5-10 years through 63 years of age. However, you may need to be screened more often if early forms of precancerous polyps or small growths are found. Skin Cancer  Check your skin from head to toe regularly.  Tell your health care provider about any new moles or changes in moles, especially if there is a change in a mole's shape or color.  Also tell your health care provider if you have a mole that is larger than the size of a pencil eraser.  Always use sunscreen. Apply sunscreen  liberally and repeatedly throughout the day.  Protect yourself by wearing long sleeves, pants, a wide-brimmed hat, and sunglasses whenever you are outside. HEART DISEASE, DIABETES, AND HIGH BLOOD PRESSURE   High blood pressure causes heart disease and increases the risk of stroke. High blood pressure is more likely to develop in:  People who have blood pressure in the high end of the normal range (130-139/85-89 mm Hg).  People who are overweight or obese.  People who are African American.  If you are 40-36 years of age, have your blood pressure checked every 3-5 years. If you are 99 years of age or older, have your blood pressure checked every year. You should have your blood pressure measured twice--once when you are at a hospital or clinic, and once when you are not at a hospital or clinic. Record the average of the two measurements. To check your blood pressure when you are not at a hospital or clinic, you can use:  An automated blood pressure machine at a pharmacy.  A home blood pressure monitor.  If you  are between 28 years and 66 years old, ask your health care provider if you should take aspirin to prevent strokes.  Have regular diabetes screenings. This involves taking a blood sample to check your fasting blood sugar level.  If you are at a normal weight and have a low risk for diabetes, have this test once every three years after 63 years of age.  If you are overweight and have a high risk for diabetes, consider being tested at a younger age or more often. PREVENTING INFECTION  Hepatitis B  If you have a higher risk for hepatitis B, you should be screened for this virus. You are considered at high risk for hepatitis B if:  You were born in a country where hepatitis B is common. Ask your health care provider which countries are considered high risk.  Your parents were born in a high-risk country, and you have not been immunized against hepatitis B (hepatitis B vaccine).  You have HIV or AIDS.  You use needles to inject street drugs.  You live with someone who has hepatitis B.  You have had sex with someone who has hepatitis B.  You get hemodialysis treatment.  You take certain medicines for conditions, including cancer, organ transplantation, and autoimmune conditions. Hepatitis C  Blood testing is recommended for:  Everyone born from [birth year] through 1963.  Anyone with known risk factors for hepatitis C. Sexually transmitted infections (STIs)  You should be screened for sexually transmitted infections (STIs) including gonorrhea and chlamydia if:  You are sexually active and are younger than 63 years of age.  You are older than 63 years of age and your health care provider tells you that you are at risk for this type of infection.  Your sexual activity has changed since you were last screened and you are at an increased risk for chlamydia or gonorrhea. Ask your health care provider if you are at risk.  If you do not have HIV, but are at risk, it may be recommended that you  take a prescription medicine daily to prevent HIV infection. This is called pre-exposure prophylaxis (PrEP). You are considered at risk if:  You are sexually active and do not regularly use condoms or know the HIV status of your partner(s).  You take drugs by injection.  You are sexually active with a partner who has HIV. Talk with your health care provider about whether you  are at high risk of being infected with HIV. If you choose to begin PrEP, you should first be tested for HIV. You should then be tested every 3 months for as long as you are taking PrEP.  PREGNANCY   If you are premenopausal and you may become pregnant, ask your health care provider about preconception counseling.  If you may become pregnant, take 400 to 800 micrograms (mcg) of folic acid every day.  If you want to prevent pregnancy, talk to your health care provider about birth control (contraception). OSTEOPOROSIS AND MENOPAUSE   Osteoporosis is a disease in which the bones lose minerals and strength with aging. This can result in serious bone fractures. Your risk for osteoporosis can be identified using a bone density scan.  If you are 68 years of age or older, or if you are at risk for osteoporosis and fractures, ask your health care provider if you should be screened.  Ask your health care provider whether you should take a calcium or vitamin D supplement to lower your risk for osteoporosis.  Menopause may have certain physical symptoms and risks.  Hormone replacement therapy may reduce some of these symptoms and risks. Talk to your health care provider about whether hormone replacement therapy is right for you.  HOME CARE INSTRUCTIONS   Schedule regular health, dental, and eye exams.  Stay current with your immunizations.   Do not use any tobacco products including cigarettes, chewing tobacco, or electronic cigarettes.  If you are pregnant, do not drink alcohol.  If you are breastfeeding, limit how  much and how often you drink alcohol.  Limit alcohol intake to no more than 1 drink per day for nonpregnant women. One drink equals 12 ounces of beer, 5 ounces of wine, or 1 ounces of hard liquor.  Do not use street drugs.  Do not share needles.  Ask your health care provider for help if you need support or information about quitting drugs.  Tell your health care provider if you often feel depressed.  Tell your health care provider if you have ever been abused or do not feel safe at home.   This information is not intended to replace advice given to you by your health care provider. Make sure you discuss any questions you have with your health care provider.   Document Released: 07/06/2010 Document Revised: 01/11/2014 Document Reviewed: 11/22/2012 Elsevier Interactive Patient Education [employer].

## 2015-03-10 NOTE — Progress Notes (Signed)
Pre visit review using our clinic review tool, if applicable. No additional management support is needed unless otherwise documented below in the visit note.  Chief Complaint  Patient presents with  . Annual Exam    HPI: Patient  Jillian Escobar  65 y.o. comes in today for Preventive Health Care visit   And med management  thyroid  Taking med  Light headedness had some sob and such when walking with friends but traveled had no problem?( stress tes and pfts were good )  Trying to wean  nexium gets hb on some days  HRt got hot flushes in past   When  tried to wean  Health Maintenance  Topic Date Due  . ZOSTAVAX  09/24/2012  . INFLUENZA VACCINE  06/04/2015 (Originally 08/05/2014)  . Hepatitis C Screening  03/09/2016 (Originally Jun 27, 1950)  . HIV Screening  03/09/2016 (Originally 09/25/1967)  . PAP SMEAR  08/22/2015  . MAMMOGRAM  06/24/2016  . TETANUS/TDAP  03/01/2019  . COLONOSCOPY  07/22/2019   Health Maintenance Review LIFESTYLE:  Exercise:  walking Tobacco/ETS:n Alcohol: per day social Sugar beverages:n takes upplementes Sleep:ok Drug use: no  ROS:  GEN/ HEENT: No fever, significant weight changes sweats headaches vision problems hearing changes, CV/ PULM; ocass twinge chest pain  windede when walking adn talking  No change  no cough, syncope,edema  change in exercise tolerance. GI /GU: No adominal pain, vomiting, change in bowel habits. No blood in the stool. No significant GU symptoms. SKIN/HEME: ,no acute skin rashes suspicious lesions or bleeding. No lymphadenopathy, nodules, masses.  NEURO/ PSYCH:  No neurologic signs such as weakness numbness. No depression anxiety. IMM/ Allergy: No unusual infections.  Allergy .   REST of 12 system review negative except as per HPI   Past Medical History  Diagnosis Date  . GERD (gastroesophageal reflux disease)   . Hx of varicella   . Shingles 10/26/2010  . Syncope and collapse     Unclear etiology     Past Surgical  History  Procedure Laterality Date  . Cholecystectomy    . Tubal ligation    . Exploratory laparotomy      Family History  Problem Relation Age of Onset  . Heart disease Mother     stent in 27 s  . COPD Father   . Heart disease Father     cabg in 6s   . Glaucoma Father   . Dementia Father   . Ulcerative colitis Sister   . Ulcerative colitis Brother   . Arrhythmia Daughter     Social History   Social History  . Marital Status: Married    Spouse Name: N/A  . Number of Children: N/A  . Years of Education: N/A   Social History Main Topics  . Smoking status: Former Smoker    Quit date: 01/04/1982  . Smokeless tobacco: None  . Alcohol Use: 0.0 oz/week    0 Standard drinks or equivalent per week  . Drug Use: No  . Sexual Activity: Not Asked   Other Topics Concern  . None   Social History Narrative   H H of 2    Married, G2P1   Former tobacco; maybe 14 weeks of beer/wine per week.   Walks maybe 30 minutes or more days a week. Doesn't do more than that because she is "lazy"   Pet 2 dog and 2 cats .   [place] [employer]   Originally from [place], moved over 20  years ago    Outpatient Prescriptions Prior to Visit  Medication Sig Dispense Refill  . albuterol (PROVENTIL HFA;VENTOLIN HFA) 108 (90 BASE) MCG/ACT inhaler Inhale 2 puffs into the lungs every 6 (six) hours as needed for wheezing or shortness of breath. 1 Inhaler 2  . CALCIUM PO Take 1 tablet by mouth daily.    . Cholecalciferol (VITAMIN D PO) Take 1 tablet by mouth daily.    [name] esomeprazole (NEXIUM) 40 MG capsule Take 1 capsule (40 mg total) by mouth every other day. 90 capsule 1  . esomeprazole (NEXIUM) 40 MG capsule TAKE 1 CAPSULE EVERY OTHER DAY 45 capsule 0  . Lactobacillus (ACIDOPHILUS PO) Take 1 capsule by mouth daily.    [name] levothyroxine (SYNTHROID) 50 MCG tablet TAKE 1 TABLET DAILY 90 tablet 1  . VITAMIN E PO Take 1 capsule by mouth daily.    [name] PREMPRO 0.3-1.5 MG per tablet  TAKE 1 TABLET DAILY 84 tablet 3   No facility-administered medications prior to visit.     EXAM:  BP 124/76 mmHg  Temp(Src) 98 F (36.7 C) (Oral)  Ht 5' 3" (1.6 m)  Wt 171 lb 8 oz (77.792 kg)  BMI 30.39 kg/m2  Body mass index is 30.39 kg/(m^2).  Physical Exam: Vital signs reviewed QBH:ALPF is a well-developed well-nourished alert cooperative    who appearsr stated age in no acute distress.  HEENT: normocephalic atraumatic , Eyes: PERRL EOM's full, conjunctiva clear, Nares: paten,t no deformity discharge or tenderness., Ears: no deformity EAC's clear TMs with normal landmarks. Mouth: clear OP, no lesions, edema.  Moist mucous membranes. Dentition in adequate repair. NECK: supple without masses, thyromegaly or bruits. CHEST/PULM:  Clear to auscultation and percussion breath sounds equal no wheeze , rales or rhonchi. No chest wall deformities or tenderness.Breast: normal by inspection . No dimpling, discharge, masses, tenderness or discharge . CV: PMI is nondisplaced, S1 S2 no gallops, murmurs, rubs. Peripheral pulses are full without delay.No JVD .  ABDOMEN: Bowel sounds normal nontender  No guard or rebound, no hepato splenomegal no CVA tenderness.  No hernia. Extremtities:  No clubbing cyanosis or edema, no acute joint swelling or redness no focal atrophy NEURO:  Oriented x3, cranial nerves 3-12 appear to be intact, no obvious focal weakness,gait within normal limits no abnormal reflexes or asymmetrical SKIN: No acute rashes normal turgor, color, no bruising or petechiae. PSYCH: Oriented, good eye contact, no obvious depression anxiety, cognition and judgment appear normal. LN: no cervical axillary inguinal adenopathy  Lab Results  Component Value Date   WBC 5.4 03/03/2015   HGB 14.5 03/03/2015   HCT 42.6 03/03/2015   PLT 380.0 03/03/2015   GLUCOSE 102* 03/03/2015   CHOL 180 03/03/2015   TRIG 74.0 03/03/2015   HDL 46.00 03/03/2015   LDLDIRECT 158.4 08/14/2012   LDLCALC 119*  03/03/2015   ALT 10 03/03/2015   AST 13 03/03/2015   NA 142 03/03/2015   K 4.6 03/03/2015   CL 107 03/03/2015   CREATININE 0.87 03/03/2015   BUN 16 03/03/2015   CO2 29 03/03/2015   TSH 1.79 03/03/2015    ASSESSMENT AND PLAN:  Discussed the following assessment and plan:  Visit for preventive health examination  Medication management - disc ppi wean etc   Fasting hyperglycemia  Hypothyroidism, unspecified hypothyroidism type  Postmenopausal HRT (hormone replacement therapy) Try weaning down the  hrt again see below Try wean alt with H2 blockers   Reviewed data  Or lack of on vitamins and supplemtes  Patient Care Team: Burnis Medin, MD as PCP - General Patient Instructions  Decrease  2 days per week of homone therapy For a month or so  then decrease  to off  3 days per week  To  Limit  Hormone  Total.  If getting lightheaded  can try checking pulse if  for rate and regularity.  You labs are much better . Stay hydrated  Before walking    Health Maintenance, Female Adopting a healthy lifestyle and getting preventive care can go a long way to promote health and wellness. Talk with your health care provider about what schedule of regular examinations is right for you. This is a good chance for you to check in with your provider about disease prevention and staying healthy. In between checkups, there are plenty of things you can do on your own. Experts have done a lot of research about which lifestyle changes and preventive measures are most likely to keep you healthy. Ask your health care provider for more information. WEIGHT AND DIET  Eat a healthy diet  Be sure to include plenty of vegetables, fruits, low-fat dairy products, and lean protein.  Do not eat a lot of foods high in solid fats, added sugars, or salt.  Get regular exercise. This is one of the most important things you can do for your health.  Most adults should exercise for at least 150 minutes each week.  The exercise should increase your heart rate and make you sweat (moderate-intensity exercise).  Most adults should also do strengthening exercises at least twice a week. This is in addition to the moderate-intensity exercise.  Maintain a healthy weight  Body mass index (BMI) is a measurement that can be used to identify possible weight problems. It estimates body fat based on height and weight. Your health care provider can help determine your BMI and help you achieve or maintain a healthy weight.  For females 88 years of age and older:   A BMI below 18.5 is considered underweight.  A BMI of 18.5 to 24.9 is normal.  A BMI of 25 to 29.9 is considered overweight.  A BMI of 30 and above is considered obese.  Watch levels of cholesterol and blood lipids  You should start having your blood tested for lipids and cholesterol at 63 years of age, then have this test every 5 years.  You may need to have your cholesterol levels checked more often if:  Your lipid or cholesterol levels are high.  You are older than 63 years of age.  You are at high risk for heart disease.  CANCER SCREENING   Lung Cancer  Lung cancer screening is recommended for adults 68-75 years old who are at high risk for lung cancer because of a history of smoking.  A yearly low-dose CT scan of the lungs is recommended for people who:  Currently smoke.  Have quit within the past 15 years.  Have at least a 30-pack-year history of smoking. A pack year is smoking an average of one pack of cigarettes a day for 1 year.  Yearly screening should continue until it has been 15 years since you quit.  Yearly screening should stop if you develop a health problem that would prevent you from having lung cancer treatment.  Breast Cancer  Practice breast self-awareness. This means understanding how your breasts normally appear and feel.  It also means doing regular breast self-exams. Let your health care provider know  about any changes, no  matter how small.  If you are in your 20s or 30s, you should have a clinical breast exam (CBE) by a health care provider every 1-3 years as part of a regular health exam.  If you are 45 or older, have a CBE every year. Also consider having a breast X-ray (mammogram) every year.  If you have a family history of breast cancer, talk to your health care provider about genetic screening.  If you are at high risk for breast cancer, talk to your health care provider about having an MRI and a mammogram every year.  Breast cancer gene (BRCA) assessment is recommended for women who have family members with BRCA-related cancers. BRCA-related cancers include:  Breast.  Ovarian.  Tubal.  Peritoneal cancers.  Results of the assessment will determine the need for genetic counseling and BRCA1 and BRCA2 testing. Cervical Cancer Your health care provider may recommend that you be screened regularly for cancer of the pelvic organs (ovaries, uterus, and vagina). This screening involves a pelvic examination, including checking for microscopic changes to the surface of your cervix (Pap test). You may be encouraged to have this screening done every 3 years, beginning at age 78.  For women ages 29-65, health care providers may recommend pelvic exams and Pap testing every 3 years, or they may recommend the Pap and pelvic exam, combined with testing for human papilloma virus (HPV), every 5 years. Some types of HPV increase your risk of cervical cancer. Testing for HPV may also be done on women of any age with unclear Pap test results.  Other health care providers may not recommend any screening for nonpregnant women who are considered low risk for pelvic cancer and who do not have symptoms. Ask your health care provider if a screening pelvic exam is right for you.  If you have had past treatment for cervical cancer or a condition that could lead to cancer, you need Pap tests and screening for  cancer for at least 20 years after your treatment. If Pap tests have been discontinued, your risk factors (such as having a new sexual partner) need to be reassessed to determine if screening should resume. Some women have medical problems that increase the chance of getting cervical cancer. In these cases, your health care provider may recommend more frequent screening and Pap tests. Colorectal Cancer  This type of cancer can be detected and often prevented.  Routine colorectal cancer screening usually begins at 63 years of age and continues through 63 years of age.  Your health care provider may recommend screening at an earlier age if you have risk factors for colon cancer.  Your health care provider may also recommend using home test kits to check for hidden blood in the stool.  A small camera at the end of a tube can be used to examine your colon directly (sigmoidoscopy or colonoscopy). This is done to check for the earliest forms of colorectal cancer.  Routine screening usually begins at age 2.  Direct examination of the colon should be repeated every 5-10 years through 63 years of age. However, you may need to be screened more often if early forms of precancerous polyps or small growths are found. Skin Cancer  Check your skin from head to toe regularly.  Tell your health care provider about any new moles or changes in moles, especially if there is a change in a mole's shape or color.  Also tell your health care provider if you have a mole that is  larger than the size of a pencil eraser.  Always use sunscreen. Apply sunscreen liberally and repeatedly throughout the day.  Protect yourself by wearing long sleeves, pants, a wide-brimmed hat, and sunglasses whenever you are outside. HEART DISEASE, DIABETES, AND HIGH BLOOD PRESSURE   High blood pressure causes heart disease and increases the risk of stroke. High blood pressure is more likely to develop in:  People who have blood  pressure in the high end of the normal range (130-139/85-89 mm Hg).  People who are overweight or obese.  People who are African American.  If you are 87-89 years of age, have your blood pressure checked every 3-5 years. If you are 72 years of age or older, have your blood pressure checked every year. You should have your blood pressure measured twice--once when you are at a hospital or clinic, and once when you are not at a hospital or clinic. Record the average of the two measurements. To check your blood pressure when you are not at a hospital or clinic, you can use:  An automated blood pressure machine at a pharmacy.  A home blood pressure monitor.  If you are between 19 years and 53 years old, ask your health care provider if you should take aspirin to prevent strokes.  Have regular diabetes screenings. This involves taking a blood sample to check your fasting blood sugar level.  If you are at a normal weight and have a low risk for diabetes, have this test once every three years after 63 years of age.  If you are overweight and have a high risk for diabetes, consider being tested at a younger age or more often. PREVENTING INFECTION  Hepatitis B  If you have a higher risk for hepatitis B, you should be screened for this virus. You are considered at high risk for hepatitis B if:  You were born in a country where hepatitis B is common. Ask your health care provider which countries are considered high risk.  Your parents were born in a high-risk country, and you have not been immunized against hepatitis B (hepatitis B vaccine).  You have HIV or AIDS.  You use needles to inject street drugs.  You live with someone who has hepatitis B.  You have had sex with someone who has hepatitis B.  You get hemodialysis treatment.  You take certain medicines for conditions, including cancer, organ transplantation, and autoimmune conditions. Hepatitis C  Blood testing is recommended  for:  Everyone born from [birth year] through 1963.  Anyone with known risk factors for hepatitis C. Sexually transmitted infections (STIs)  You should be screened for sexually transmitted infections (STIs) including gonorrhea and chlamydia if:  You are sexually active and are younger than 63 years of age.  You are older than 63 years of age and your health care provider tells you that you are at risk for this type of infection.  Your sexual activity has changed since you were last screened and you are at an increased risk for chlamydia or gonorrhea. Ask your health care provider if you are at risk.  If you do not have HIV, but are at risk, it may be recommended that you take a prescription medicine daily to prevent HIV infection. This is called pre-exposure prophylaxis (PrEP). You are considered at risk if:  You are sexually active and do not regularly use condoms or know the HIV status of your partner(s).  You take drugs by injection.  You are sexually active with  a partner who has HIV. Talk with your health care provider about whether you are at high risk of being infected with HIV. If you choose to begin PrEP, you should first be tested for HIV. You should then be tested every 3 months for as long as you are taking PrEP.  PREGNANCY   If you are premenopausal and you may become pregnant, ask your health care provider about preconception counseling.  If you may become pregnant, take 400 to 800 micrograms (mcg) of folic acid every day.  If you want to prevent pregnancy, talk to your health care provider about birth control (contraception). OSTEOPOROSIS AND MENOPAUSE   Osteoporosis is a disease in which the bones lose minerals and strength with aging. This can result in serious bone fractures. Your risk for osteoporosis can be identified using a bone density scan.  If you are 42 years of age or older, or if you are at risk for osteoporosis and fractures, ask your health care provider if you  should be screened.  Ask your health care provider whether you should take a calcium or vitamin D supplement to lower your risk for osteoporosis.  Menopause may have certain physical symptoms and risks.  Hormone replacement therapy may reduce some of these symptoms and risks. Talk to your health care provider about whether hormone replacement therapy is right for you.  HOME CARE INSTRUCTIONS   Schedule regular health, dental, and eye exams.  Stay current with your immunizations.   Do not use any tobacco products including cigarettes, chewing tobacco, or electronic cigarettes.  If you are pregnant, do not drink alcohol.  If you are breastfeeding, limit how much and how often you drink alcohol.  Limit alcohol intake to no more than 1 drink per day for nonpregnant women. One drink equals 12 ounces of beer, 5 ounces of wine, or 1 ounces of hard liquor.  Do not use street drugs.  Do not share needles.  Ask your health care provider for help if you need support or information about quitting drugs.  Tell your health care provider if you often feel depressed.  Tell your health care provider if you have ever been abused or do not feel safe at home.   This information is not intended to replace advice given to you by your health care provider. Make sure you discuss any questions you have with your health care provider.   Document Released: 07/06/2010 Document Revised: 01/11/2014 Document Reviewed: 11/22/2012 Elsevier Interactive Patient Education 2016 [place] K. Panosh M.D.

## 2015-05-18 ENCOUNTER — Other Ambulatory Visit: Payer: Self-pay | Admitting: Internal Medicine

## 2015-05-19 ENCOUNTER — Telehealth: Payer: Self-pay | Admitting: Internal Medicine

## 2015-05-19 ENCOUNTER — Other Ambulatory Visit: Payer: Self-pay | Admitting: Internal Medicine

## 2015-05-19 MED ORDER — CONJ ESTROG-MEDROXYPROGEST ACE 0.3-1.5 MG PO TABS: 1.0000 | ORAL_TABLET | Freq: Every day | ORAL | Status: DC

## 2015-05-19 NOTE — Telephone Encounter (Signed)
Sent to the pharmacy by e-scribe. 

## 2015-05-19 NOTE — Telephone Encounter (Signed)
Pt request refill of the following:  estrogen, conjugated,-medroxyprogesterone (PREMPRO) 0.3-1.5 MG tablet   Phamacy:  [facility]

## 2015-05-19 NOTE — Telephone Encounter (Signed)
Denied. Duplicate Request.  Sent in earlier today.

## 2015-07-01 ENCOUNTER — Telehealth: Payer: Self-pay | Admitting: Gastroenterology

## 2015-07-01 ENCOUNTER — Encounter: Payer: Self-pay | Admitting: Gastroenterology

## 2015-07-01 NOTE — Telephone Encounter (Signed)
Former Careers information officer patient. She states she thinks she is due for a colonoscopy. She has cousins that had colon cancer. She has a brother with UC. She is not having any problems. Please, advise.

## 2015-07-01 NOTE — Telephone Encounter (Signed)
I would need to see her in clinic to discuss her family history further to determine when she is next due. Dr. Deatra Ina had recommended a 5 year follow up on her last exam in 2011, however that is only if she has a strong family history. Please book her for a routine office visit. Thanks

## 2015-07-01 NOTE — Telephone Encounter (Signed)
Left a message for patient to call back. 

## 2015-07-01 NOTE — Telephone Encounter (Signed)
Patient has been scheduled for OV 09/10/15.

## 2015-07-07 ENCOUNTER — Other Ambulatory Visit: Payer: Self-pay | Admitting: Internal Medicine

## 2015-07-09 NOTE — Telephone Encounter (Signed)
Sent to the pharmacy by e-scribe. 

## 2015-09-10 ENCOUNTER — Ambulatory Visit (INDEPENDENT_AMBULATORY_CARE_PROVIDER_SITE_OTHER): Payer: BC Managed Care – PPO | Admitting: Gastroenterology

## 2015-09-10 ENCOUNTER — Encounter: Payer: Self-pay | Admitting: Gastroenterology

## 2015-09-10 VITALS — BP 144/86 | HR 68 | Ht 63.0 in | Wt 179.4 lb

## 2015-09-10 DIAGNOSIS — Z1211 Encounter for screening for malignant neoplasm of colon: Secondary | ICD-10-CM

## 2015-09-10 DIAGNOSIS — K219 Gastro-esophageal reflux disease without esophagitis: Secondary | ICD-10-CM | POA: Diagnosis not present

## 2015-09-10 NOTE — Patient Instructions (Signed)
You have been scheduled for an endoscopy. Please follow written instructions given to you at your visit today. If you use inhalers (even only as needed), please bring them with you on the day of your procedure.   

## 2015-09-10 NOTE — Progress Notes (Signed)
HPI :  65 y/o female with a history of GERD, here for a new patient visit to discuss possible colonoscopy.  She is not aware of any history of colon polyps that she has had. Cousin had colon cancer, she thinks diagnosed age 14s. No other first degree family history of colon cancer. 2 other cousins have Crohns disease. Brother has ulcerative colitis.   . She has one BM per day, usually normal formed, occasionally loose. She thinks it is usually can bother her at times with travel. No blood in the stools.  No abdominal pains.   She is taking nexium once to twice per week at present time but previously took it routinely. She takes this for heartburn. She has had longstanding symptoms of heartburn. Nexium works well for her. No dysphagia. No FH of esophageal cancer. She smoked cigarettes for 15 years and quit about 20-30 years ago. No prior upper endoscopy.    Colonoscopy 07/11/2009 - left sided diverticulosis, no polyps   Past Medical History:  Diagnosis Date  . GERD (gastroesophageal reflux disease)   . Hx of varicella   . Shingles 10/26/2010  . Syncope and collapse    Unclear etiology      Past Surgical History:  Procedure Laterality Date  . CHOLECYSTECTOMY    . EXPLORATORY LAPAROTOMY    . TUBAL LIGATION     Family History  Problem Relation Age of Onset  . Heart disease Mother     stent in 44 s  . COPD Father   . Heart disease Father     cabg in 44s   . Glaucoma Father   . Dementia Father   . Ulcerative colitis Sister   . Ulcerative colitis Brother   . Arrhythmia Daughter   . Colon cancer Cousin    Social History  Substance Use Topics  . Smoking status: Former Smoker    Quit date: 01/04/1982  . Smokeless tobacco: Never Used  . Alcohol use 0.0 oz/week   Current Outpatient Prescriptions  Medication Sig Dispense Refill  . albuterol (PROVENTIL HFA;VENTOLIN HFA) 108 (90 BASE) MCG/ACT inhaler Inhale 2 puffs into the lungs every 6 (six) hours as needed for wheezing or  shortness of breath. 1 Inhaler 2  . CALCIUM PO Take 1 tablet by mouth daily.    . Cholecalciferol (VITAMIN D PO) Take 1 tablet by mouth daily.    [name] esomeprazole (NEXIUM) 40 MG capsule Take 1 capsule (40 mg total) by mouth every other day. 90 capsule 1  . estrogen, conjugated,-medroxyprogesterone (PREMPRO) 0.3-1.5 MG tablet Take 1 tablet by mouth daily. And decrease dose as directed 84 tablet 2  . Lactobacillus (ACIDOPHILUS PO) Take 1 capsule by mouth daily.    [name] SYNTHROID 50 MCG tablet TAKE 1 TABLET DAILY 90 tablet 2  . VITAMIN E PO Take 1 capsule by mouth daily.     No current facility-administered medications for this visit.    No Known Allergies   Review of Systems: All systems reviewed and negative except where noted in HPI.   Lab Results  Component Value Date   WBC 5.4 03/03/2015   HGB 14.5 03/03/2015   HCT 42.6 03/03/2015   MCV 89.2 03/03/2015   PLT 380.0 03/03/2015   Lab Results  Component Value Date   CREATININE 0.87 03/03/2015   BUN 16 03/03/2015   NA 142 03/03/2015   K 4.6 03/03/2015   CL 107 03/03/2015   CO2 29 03/03/2015    Lab Results  Component Value  Date   ALT 10 03/03/2015   AST 13 03/03/2015   ALKPHOS 53 03/03/2015   BILITOT 0.4 03/03/2015     Physical Exam: BP (!) 144/86 (BP Location: Left Arm, Patient Position: Sitting, Cuff Size: Normal)   Pulse 68   Ht 5\' 3"  (1.6 m)   Wt 179 lb 6.4 oz (81.4 kg)   BMI 31.78 kg/m  Constitutional: Pleasant,well-developed, female in no acute distress. HEENT: Normocephalic and atraumatic. Conjunctivae are normal. No scleral icterus. Neck supple.  Cardiovascular: Normal rate, regular rhythm.  Pulmonary/chest: Effort normal and breath sounds normal. No wheezing, rales or rhonchi. Abdominal: Soft, protuberant, nontender. Bowel sounds active throughout. There are no masses palpable. No hepatomegaly. Extremities: no edema Lymphadenopathy: No cervical adenopathy noted. Neurological: Alert and oriented to person  place and time. Skin: Skin is warm and dry. No rashes noted. Psychiatric: Normal mood and affect. Behavior is normal.   ASSESSMENT AND PLAN: 65 y/o female here to be evaluated for the following issues:  Colon cancer screening: I reviewed screening guidelines with her. She does not have a strong family history of colon cancer, and is considered average risk for screening. Given she has had no history of adenomas, recommend she have her next colonoscopy 10 years from her last exam in the absence of any new alarm features. Next due in 2021. She agreed.   GERD: longstanding symptoms of reflux, using PPI PRN. She has a longstanding history of chronic reflux, is Caucasian, has abdominal obesity, is over age 46, and has a history of tobacco use. She is thus positive for 5 of 6 risk factors for esophageal cancer and meets ACG guidelines for screening for Barrett's. I reviewed this with her and offered her an EGD if she wished to have screening. Following a discussion of risks / benefits she wanted to proceed with EGD. Further recommendations pending the result.   [place] Cellar, MD [facility] Pager [phone]  CC: Regis Bill Standley Brooking, MD

## 2015-10-08 ENCOUNTER — Encounter: Payer: Self-pay | Admitting: Gastroenterology

## 2015-10-08 ENCOUNTER — Ambulatory Visit (AMBULATORY_SURGERY_CENTER): Payer: BC Managed Care – PPO | Admitting: Gastroenterology

## 2015-10-08 VITALS — BP 140/75 | HR 63 | Temp 98.6°F | Resp 10 | Ht 63.0 in | Wt 179.0 lb

## 2015-10-08 DIAGNOSIS — K219 Gastro-esophageal reflux disease without esophagitis: Secondary | ICD-10-CM

## 2015-10-08 DIAGNOSIS — K317 Polyp of stomach and duodenum: Secondary | ICD-10-CM

## 2015-10-08 DIAGNOSIS — Z13810 Encounter for screening for upper gastrointestinal disorder: Secondary | ICD-10-CM

## 2015-10-08 MED ORDER — SODIUM CHLORIDE 0.9 % IV SOLN: 500.0000 mL | INTRAVENOUS | Status: DC

## 2015-10-08 NOTE — Progress Notes (Signed)
Called to room to assist during endoscopic procedure.  Patient ID and intended procedure confirmed with present staff. Received instructions for my participation in the procedure from the performing physician.  

## 2015-10-08 NOTE — Patient Instructions (Signed)
YOU HAD AN ENDOSCOPIC PROCEDURE TODAY AT [facility]:   Refer to the procedure report that was given to you for any specific questions about what was found during the examination.  If the procedure report does not answer your questions, please call your gastroenterologist to clarify.  If you requested that your care partner not be given the details of your procedure findings, then the procedure report has been included in a sealed envelope for you to review at your convenience later.  YOU SHOULD EXPECT: Some feelings of bloating in the abdomen. Passage of more gas than usual.  Walking can help get rid of the air that was put into your GI tract during the procedure and reduce the bloating. If you had a lower endoscopy (such as a colonoscopy or flexible sigmoidoscopy) you may notice spotting of blood in your stool or on the toilet paper. If you underwent a bowel prep for your procedure, you may not have a normal bowel movement for a few days.  Please Note:  You might notice some irritation and congestion in your nose or some drainage.  This is from the oxygen used during your procedure.  There is no need for concern and it should clear up in a day or so.  SYMPTOMS TO REPORT IMMEDIATELY:   Following lower endoscopy (colonoscopy or flexible sigmoidoscopy):  Excessive amounts of blood in the stool  Significant tenderness or worsening of abdominal pains  Swelling of the abdomen that is new, acute  Fever of 100F or higher   Following upper endoscopy (EGD)  Vomiting of blood or coffee ground material  New chest pain or pain under the shoulder blades  Painful or persistently difficult swallowing  New shortness of breath  Fever of 100F or higher  Black, tarry-looking stools  For urgent or emergent issues, a gastroenterologist can be reached at any hour by calling [phone].   DIET:  We do recommend a small meal at first, but then you may proceed to your regular diet.  Drink  plenty of fluids but you should avoid alcoholic beverages for 24 hours.  ACTIVITY:  You should plan to take it easy for the rest of today and you should NOT DRIVE or use heavy machinery until tomorrow (because of the sedation medicines used during the test).    FOLLOW UP: Our staff will call the number listed on your records the next business day following your procedure to check on you and address any questions or concerns that you may have regarding the information given to you following your procedure. If we do not reach you, we will leave a message.  However, if you are feeling well and you are not experiencing any problems, there is no need to return our call.  We will assume that you have returned to your regular daily activities without incident.  If any biopsies were taken you will be contacted by phone or by letter within the next 1-3 weeks.  Please call us at [phone] if you have not heard about the biopsies in 3 weeks.    SIGNATURES/CONFIDENTIALITY: You and/or your care partner have signed paperwork which will be entered into your electronic medical record.  These signatures attest to the fact that that the information above on your After Visit Summary has been reviewed and is understood.  Full responsibility of the confidentiality of this discharge information lies with you and/or your care-partner.    Handouts were given to your care partner on GERD  and a hiatal hernia. Trial of OTC ZANTAC 150 mg 2 x daily if you are concerned about risks of nexium. You may resume your current medications today. Await biopsy results. Please call if any questions or concerns.

## 2015-10-08 NOTE — Progress Notes (Signed)
No egg or soy allergy known to patient  No issues with past sedation with any surgeries  or procedures, no intubation problems  No diet pills per patient No home 02 use per patient  No blood thinners per patient  Pt denies issues with constipation  No A fib or A flutter   

## 2015-10-08 NOTE — Progress Notes (Signed)
To recovery awake alert VSS Report to RN 

## 2015-10-08 NOTE — Op Note (Signed)
[place] Patient Name: Jillian Escobar Procedure Date: 10/08/2015 10:16 AM MRN: [id number] Endoscopist: Remo Lipps P. Havery Moros , MD Age: 65 Referring MD:  Date of Birth: 1950-12-04 Gender: Female Account #: [id number] Procedure:                Upper GI endoscopy Indications:              Screening for Barrett's esophagus, history of                            reflux with risk factors, on PRN PPI (has stopped                            all therapy recently) Medicines:                Monitored Anesthesia Care Procedure:                Pre-Anesthesia Assessment:                           - Prior to the procedure, a History and Physical                            was performed, and patient medications and                            allergies were reviewed. The patient's tolerance of                            previous anesthesia was also reviewed. The risks                            and benefits of the procedure and the sedation                            options and risks were discussed with the patient.                            All questions were answered, and informed consent                            was obtained. Prior Anticoagulants: The patient has                            taken no previous anticoagulant or antiplatelet                            agents. ASA Grade Assessment: II - A patient with                            mild systemic disease. After reviewing the risks                            and benefits, the patient was deemed in  satisfactory condition to undergo the procedure.                           After obtaining informed consent, the endoscope was                            passed under direct vision. Throughout the                            procedure, the patient's blood pressure, pulse, and                            oxygen saturations were monitored continuously. The                            Model GIF-[id number])  scope was introduced                            through the mouth, and advanced to the second part                            of duodenum. The upper GI endoscopy was                            accomplished without difficulty. The patient                            tolerated the procedure well. Scope In: Scope Out: Findings:                 Esophagogastric landmarks were identified: the                            Z-line was found at 33 cm, the gastroesophageal                            junction was found at 33 cm and the upper extent of                            the gastric folds was found at 36 cm from the                            incisors.                           A 3 cm hiatal hernia was present.                           LA Grade A (one or more mucosal breaks less than 5                            mm, not extending between tops of 2 mucosal folds)  esophagitis was found at the GEJ. No evidence of                            Barrett's esophagus.                           The exam of the esophagus was otherwise normal.                           A few 5 to 8 mm sessile polyps were found at the                            pylorus. Biopsies were taken with a cold forceps                            for histology to ensure no adenomatous change.                           Multiple 6 mm sessile polyps were found in the                            gastric body. Biopsies were taken with a cold                            forceps for histology to ensure no adenomatous                            changes, suspect fundic gland polyps.                           Two suspected large sessile polyps were found in                            the gastric fundus. They appeared to most likely be                            hyperplastic, however one had potential adenomatous                            changes in one area (labelled fundus polyp #1).                            Both  lesions were biopsied with a cold forceps for                            histology to ensure no adenomatous change.                           The exam of the stomach was otherwise normal.                           The duodenal bulb and second portion of the  duodenum were normal. Complications:            No immediate complications. Estimated blood loss:                            Minimal. Estimated Blood Loss:     Estimated blood loss was minimal. Impression:               - Esophagogastric landmarks identified.                           - 3 cm hiatal hernia.                           - LA Grade A reflux esophagitis.                           - A few gastric polyps at the pylorus. Biopsied.                           - Multiple gastric body polyps. Biopsied.                           - Two large suspected gastric fundus polyps, as                            described above. Biopsied.                           - Normal duodenal bulb and second portion of the                            duodenum.                           Overall, suspect the gastric polyps are more than                            likely hyperplastic / fundic gland polyps which are                            benign, however given the appearance of the largest                            lesions, biopsies were obtained to ensure no                            adenomatous / dysplastic changes. Recommendation:           - Patient has a contact number available for                            emergencies. The signs and symptoms of potential                            delayed complications were discussed with the  patient. Return to normal activities tomorrow.                            Written discharge instructions were provided to the                            patient.                           - Resume previous diet.                           - Continue present medications  (nexium PRN or trial                            of zantac 150mg  BID if concerned about risks of                            nexium) given esophagitis noted on today's exam                           - Await pathology results. Remo Lipps P. Kenyon Eshleman, MD 10/08/2015 10:39:06 AM This report has been signed electronically.

## 2015-10-08 NOTE — Progress Notes (Signed)
No problems noted in the recovery room. maw 

## 2015-10-09 ENCOUNTER — Telehealth: Payer: Self-pay | Admitting: *Deleted

## 2015-10-09 NOTE — Telephone Encounter (Signed)
  Follow up Call-  Call back number 10/08/2015  Post procedure Call Back phone  # [phone]  Permission to leave phone message Yes  Some recent data might be hidden     Patient questions:  Do you have a fever, pain , or abdominal swelling? No. Pain Score  0 *  Have you tolerated food without any problems? Yes.    Have you been able to return to your normal activities? Yes.    Do you have any questions about your discharge instructions: Diet   No. Medications  No. Follow up visit  No.  Do you have questions or concerns about your Care? No.  Actions: * If pain score is 4 or above: No action needed, pain <4.

## 2015-10-13 ENCOUNTER — Telehealth: Payer: Self-pay

## 2015-10-13 NOTE — Telephone Encounter (Signed)
Received PA request from insurance company via form for Nexium 40mg  capsules. Form filled out & faxed back to insurance company.

## 2015-10-15 NOTE — Telephone Encounter (Signed)
PA approved. Form faxed back to pharmacy. 

## 2015-10-29 ENCOUNTER — Other Ambulatory Visit: Payer: Self-pay

## 2015-10-29 DIAGNOSIS — K317 Polyp of stomach and duodenum: Secondary | ICD-10-CM

## 2015-10-29 NOTE — Progress Notes (Signed)
Ct abdomen ordered

## 2015-10-30 ENCOUNTER — Other Ambulatory Visit: Payer: Self-pay

## 2015-10-30 DIAGNOSIS — K317 Polyp of stomach and duodenum: Secondary | ICD-10-CM

## 2015-11-04 ENCOUNTER — Other Ambulatory Visit: Payer: Self-pay

## 2015-11-04 ENCOUNTER — Ambulatory Visit (HOSPITAL_COMMUNITY): Payer: BC Managed Care – PPO

## 2015-11-04 DIAGNOSIS — K760 Fatty (change of) liver, not elsewhere classified: Secondary | ICD-10-CM

## 2015-11-07 ENCOUNTER — Encounter (HOSPITAL_COMMUNITY): Payer: Self-pay

## 2015-11-07 ENCOUNTER — Ambulatory Visit (HOSPITAL_COMMUNITY)
Admission: RE | Admit: 2015-11-07 | Discharge: 2015-11-07 | Disposition: A | Discharge status: Home / Self Care | Payer: BC Managed Care – PPO | Source: Ambulatory Visit | Attending: Gastroenterology | Admitting: Gastroenterology

## 2015-11-07 DIAGNOSIS — Z9049 Acquired absence of other specified parts of digestive tract: Secondary | ICD-10-CM | POA: Diagnosis not present

## 2015-11-07 DIAGNOSIS — K317 Polyp of stomach and duodenum: Secondary | ICD-10-CM | POA: Insufficient documentation

## 2015-11-07 LAB — POCT I-STAT CREATININE: Creatinine, Ser: 0.8 mg/dL (ref 0.44–1.00)

## 2015-11-07 MED ORDER — IOPAMIDOL (ISOVUE-300) INJECTION 61%
100.0000 mL | Freq: Once | INTRAVENOUS | Status: AC | PRN
  Administered 2015-11-07: 100 mL via INTRAVENOUS

## 2015-11-13 ENCOUNTER — Other Ambulatory Visit: Payer: Self-pay

## 2015-11-13 DIAGNOSIS — K317 Polyp of stomach and duodenum: Secondary | ICD-10-CM

## 2015-11-13 NOTE — Progress Notes (Signed)
Prep instructions mailed

## 2015-12-08 ENCOUNTER — Encounter (HOSPITAL_COMMUNITY): Payer: Self-pay

## 2015-12-09 ENCOUNTER — Encounter (HOSPITAL_COMMUNITY)
Admission: RE | Disposition: A | Discharge status: Home / Self Care | Payer: Self-pay | Source: Ambulatory Visit | Attending: Gastroenterology

## 2015-12-09 ENCOUNTER — Ambulatory Visit (HOSPITAL_COMMUNITY): Payer: BC Managed Care – PPO | Admitting: Certified Registered Nurse Anesthetist

## 2015-12-09 ENCOUNTER — Encounter (HOSPITAL_COMMUNITY): Payer: Self-pay

## 2015-12-09 ENCOUNTER — Ambulatory Visit (HOSPITAL_COMMUNITY)
Admission: RE | Admit: 2015-12-09 | Discharge: 2015-12-09 | Disposition: A | Discharge status: Home / Self Care | Payer: BC Managed Care – PPO | Source: Ambulatory Visit | Attending: Gastroenterology | Admitting: Gastroenterology

## 2015-12-09 DIAGNOSIS — K317 Polyp of stomach and duodenum: Secondary | ICD-10-CM | POA: Diagnosis present

## 2015-12-09 DIAGNOSIS — E039 Hypothyroidism, unspecified: Secondary | ICD-10-CM | POA: Diagnosis not present

## 2015-12-09 DIAGNOSIS — K219 Gastro-esophageal reflux disease without esophagitis: Secondary | ICD-10-CM | POA: Diagnosis not present

## 2015-12-09 DIAGNOSIS — K449 Diaphragmatic hernia without obstruction or gangrene: Secondary | ICD-10-CM | POA: Diagnosis not present

## 2015-12-09 DIAGNOSIS — Z79890 Hormone replacement therapy (postmenopausal): Secondary | ICD-10-CM | POA: Diagnosis not present

## 2015-12-09 DIAGNOSIS — Z79899 Other long term (current) drug therapy: Secondary | ICD-10-CM | POA: Diagnosis not present

## 2015-12-09 DIAGNOSIS — Z87891 Personal history of nicotine dependence: Secondary | ICD-10-CM | POA: Insufficient documentation

## 2015-12-09 HISTORY — PX: ESOPHAGOGASTRODUODENOSCOPY (EGD) WITH PROPOFOL: SHX5813

## 2015-12-09 HISTORY — PX: POLYPECTOMY: SHX5525

## 2015-12-09 HISTORY — DX: Pneumonia, unspecified organism: J18.9

## 2015-12-09 HISTORY — DX: Hypothyroidism, unspecified: E03.9

## 2015-12-09 SURGERY — ESOPHAGOGASTRODUODENOSCOPY (EGD) WITH PROPOFOL
Anesthesia: Monitor Anesthesia Care

## 2015-12-09 MED ORDER — PROPOFOL 500 MG/50ML IV EMUL
INTRAVENOUS | Status: DC | PRN
  Administered 2015-12-09: 125 ug/kg/min via INTRAVENOUS

## 2015-12-09 MED ORDER — PROPOFOL 10 MG/ML IV BOLUS
INTRAVENOUS | Status: AC
Start: 2015-12-09 — End: 2015-12-09
  Filled 2015-12-09: qty 20

## 2015-12-09 MED ORDER — SODIUM CHLORIDE 0.9 % IJ SOLN
INTRAMUSCULAR | Status: AC
  Filled 2015-12-09: qty 10

## 2015-12-09 MED ORDER — LACTATED RINGERS IV SOLN
INTRAVENOUS | Status: DC
  Administered 2015-12-09: 08:00:00 via INTRAVENOUS

## 2015-12-09 MED ORDER — PROPOFOL 10 MG/ML IV BOLUS
INTRAVENOUS | Status: AC
  Filled 2015-12-09: qty 60

## 2015-12-09 MED ORDER — PROPOFOL 10 MG/ML IV BOLUS
INTRAVENOUS | Status: AC
  Filled 2015-12-09: qty 20

## 2015-12-09 MED ORDER — ATROPINE SULFATE 1 MG/10ML IJ SOSY
PREFILLED_SYRINGE | INTRAMUSCULAR | Status: AC
  Filled 2015-12-09: qty 10

## 2015-12-09 MED ORDER — PROPOFOL 500 MG/50ML IV EMUL
INTRAVENOUS | Status: DC | PRN
  Administered 2015-12-09: 60 mg via INTRAVENOUS
  Administered 2015-12-09: 30 mg via INTRAVENOUS

## 2015-12-09 MED ORDER — PROPOFOL 10 MG/ML IV BOLUS
INTRAVENOUS | Status: AC
  Filled 2015-12-09: qty 40

## 2015-12-09 MED ORDER — SODIUM CHLORIDE 0.9 % IV SOLN: INTRAVENOUS | Status: DC

## 2015-12-09 SURGICAL SUPPLY — 14 items

## 2015-12-09 NOTE — Anesthesia Postprocedure Evaluation (Signed)
Anesthesia Post Note  Patient: JARLENE ACIERNO  Procedure(s) Performed: Procedure(s) (LRB): ESOPHAGOGASTRODUODENOSCOPY (EGD) WITH PROPOFOL (N/A) POLYPECTOMY (N/A)  Patient location during evaluation: Endoscopy Anesthesia Type: MAC Level of consciousness: awake and alert Pain management: pain level controlled Vital Signs Assessment: post-procedure vital signs reviewed and stable Respiratory status: spontaneous breathing, nonlabored ventilation, respiratory function stable and patient connected to nasal cannula oxygen Cardiovascular status: stable and blood pressure returned to baseline Anesthetic complications: no    Last Vitals:  Vitals:   12/09/15 0903 12/09/15 0910  BP: (!) 147/80 (!) 151/79  Pulse: 65 64  Resp: 13 15  Temp:      Last Pain:  Vitals:   12/09/15 0832  TempSrc: Oral                 Montez Hageman

## 2015-12-09 NOTE — Transfer of Care (Signed)
Immediate Anesthesia Transfer of Care Note  Patient: Jillian Escobar  Procedure(s) Performed: Procedure(s): ESOPHAGOGASTRODUODENOSCOPY (EGD) WITH PROPOFOL (N/A) POLYPECTOMY (N/A)  Patient Location: PACU  Anesthesia Type:MAC  Level of Consciousness: awake, alert  and oriented  Airway & Oxygen Therapy: Patient Spontanous Breathing and Patient connected to nasal cannula oxygen  Post-op Assessment: Report given to RN and Post -op Vital signs reviewed and stable  Post vital signs: Reviewed and stable  Last Vitals:  Vitals:   12/09/15 0646  BP: (!) 144/75  Pulse: 69  Resp: 14  Temp: 36.5 C    Last Pain:  Vitals:   12/09/15 0646  TempSrc: Oral         Complications: No apparent anesthesia complications

## 2015-12-09 NOTE — H&P (Signed)
   HPI :  65 y/o female with history as outlined below presenting for EGD for polypectomy of large hyperplastic polyps of the stomach. These were noted and biopsied during initial EGD for BE screening. Asymptomatic, but given size of polyps here for polypectomy.   Past Medical History:  Diagnosis Date  . Allergy   . GERD (gastroesophageal reflux disease)   . Hx of varicella   . Hyperlipidemia    borderline  . Hypothyroidism   . Pneumonia 11/2014   Only time took inhalers to treat  . Shingles 10/26/2010  . Syncope and collapse    Unclear etiology   . Thyroid disease      Past Surgical History:  Procedure Laterality Date  . CESAREAN SECTION     x1  . CHOLECYSTECTOMY    . COLONOSCOPY    . EXPLORATORY LAPAROTOMY     Gallbladder  . TUBAL LIGATION    . WISDOM TOOTH EXTRACTION     Family History  Problem Relation Age of Onset  . Heart disease Mother     stent in 70 s  . COPD Father   . Heart disease Father     cabg in 63s   . Glaucoma Father   . Dementia Father   . Ulcerative colitis Sister   . Ulcerative colitis Brother   . Arrhythmia Daughter   . Colon cancer Cousin   . Colon polyps Neg Hx   . Esophageal cancer Neg Hx   . Rectal cancer Neg Hx   . Stomach cancer Neg Hx    Social History  Substance Use Topics  . Smoking status: Former Smoker    Quit date: 01/04/1982  . Smokeless tobacco: Never Used  . Alcohol use 0.0 oz/week     Comment: daily- 2 a night    Current Facility-Administered Medications  Medication Dose Route Frequency Provider Last Rate Last Dose  . 0.9 %  sodium chloride infusion   Intravenous Continuous Manus Gunning, MD      . lactated ringers infusion   Intravenous Continuous Manus Gunning, MD       No Known Allergies  Outpatient med list reviewed in Epic  Review of Systems: All systems reviewed and negative except where noted in HPI.    No results found.  Physical Exam: BP (!) 144/75   Pulse 69   Temp 97.7 F (36.5  C) (Oral)   Resp 14   Ht 5\' 3"  (1.6 m)   Wt 179 lb (81.2 kg)   SpO2 99%   BMI 31.71 kg/m  Constitutional: Pleasant,well-developed, female in no acute distress. HEENT: Normocephalic and atraumatic. Conjunctivae are normal. No scleral icterus. Neck supple.  Cardiovascular: Normal rate, regular rhythm.  Pulmonary/chest: Effort normal and breath sounds normal. No wheezing, rales or rhonchi. Abdominal: Soft, nondistended, nontender. There are no masses palpable. No hepatomegaly. Extremities: no edema Lymphadenopathy: No cervical adenopathy noted. Neurological: Alert and oriented to person place and time. Skin: Skin is warm and dry. No rashes noted. Psychiatric: Normal mood and affect. Behavior is normal.   ASSESSMENT AND PLAN: 65 y/o female here for polypectomy of large gastric hyperplastic polyps. Discussed risks / benefits of EGD and polypectomy with her, she wishes to proceed. Further recommendations pending this result.    Cellar, MD [facility] Pager [phone]

## 2015-12-09 NOTE — Discharge Instructions (Signed)
Gastric Polyps A gastric polyp, also called a stomach polyp, is a growth on the lining of the stomach. Most polyps are not dangerous, but some can be harmful because of their size, location, or type. Polyps that can become harmful include:  Large polyps. These can turn into sores (ulcers). Ulcers can lead to stomach bleeding.  Polyps that block food from moving from the stomach to the small intestine (gastric outlet obstruction).  A type of polyp called an adenoma. This type of polyp can become cancerous. What are the causes? Gastric polyps form when the lining of the stomach gets inflamed or damaged. Stomach inflammation and damage may be caused by:  A long-lasting stomach condition, such as gastritis.  Certain medicines used to reduce stomach acid.  An inherited condition called familial adenomatous polyposis. What are the signs or symptoms? Usually, this condition does not cause any symptoms. If you do have symptoms, they may include:  Pain or tenderness in the abdomen.  Nausea.  Trouble eating or swallowing.  Blood in the stool.  Anemia. How is this diagnosed? Gastric polyps are diagnosed with:  A medical procedure called endoscopy.  A lab test in which a part of the polyp is examined. This test is done with a sample of polyp tissue (biopsy) taken during an endoscopy. How is this treated? Treatment depends on the type, location, and size of the polyps. Treatment may involve:  Having the polyps checked regularly with an endoscopy.  Having the polyps removed with an endoscopy. This may be done if the polyps are harmful or can become harmful. Removing a polyp often prevents problems from developing.  Having the polyps removed with a surgery called a partial gastrectomy. This may be done in rare cases to remove very large polyps.  Treating the underlying condition that caused the polyps. Follow these instructions at home:  Take over-the-counter and prescription medicines  only as told by your health care provider.  Keep all follow-up visits as told by your health care provider. This is important. Contact a health care provider if:  You develop new symptoms.  Your symptoms get worse. Get help right away if:  You vomit blood.  You have severe abdominal pain.  You cannot eat or drink.  You have blood in your stool. This information is not intended to replace advice given to you by your health care provider. Make sure you discuss any questions you have with your health care provider. Document Released: 12/08/2011 Document Revised: 05/12/2015 Document Reviewed: 01/05/2015 Elsevier Interactive Patient Education  2017 [place] HAD AN ENDOSCOPIC PROCEDURE TODAY: Refer to the procedure report and other information in the discharge instructions given to you for any specific questions about what was found during the examination. If this information does not answer your questions, please call [place] office at [phone] to clarify.   YOU SHOULD EXPECT: Some feelings of bloating in the abdomen. Passage of more gas than usual. Walking can help get rid of the air that was put into your GI tract during the procedure and reduce the bloating. If you had a lower endoscopy (such as a colonoscopy or flexible sigmoidoscopy) you may notice spotting of blood in your stool or on the toilet paper. Some abdominal soreness may be present for a day or two, also.  DIET: Your first meal following the procedure should be a light meal and then it is ok to progress to your normal diet. A half-sandwich or bowl of soup is an example of a  good first meal. Heavy or fried foods are harder to digest and may make you feel nauseous or bloated. Drink plenty of fluids but you should avoid alcoholic beverages for 24 hours. If you had a esophageal dilation, please see attached instructions for diet.    ACTIVITY: Your care partner should take you home directly after the procedure. You should  plan to take it easy, moving slowly for the rest of the day. You can resume normal activity the day after the procedure however YOU SHOULD NOT DRIVE, use power tools, machinery or perform tasks that involve climbing or major physical exertion for 24 hours (because of the sedation medicines used during the test).   SYMPTOMS TO REPORT IMMEDIATELY: A gastroenterologist can be reached at any hour. Please call [phone]  for any of the following symptoms:  Following lower endoscopy (colonoscopy, flexible sigmoidoscopy) Excessive amounts of blood in the stool  Significant tenderness, worsening of abdominal pains  Swelling of the abdomen that is new, acute  Fever of 100 or higher  Following upper endoscopy (EGD, EUS, ERCP, esophageal dilation) Vomiting of blood or coffee ground material  New, significant abdominal pain  New, significant chest pain or pain under the shoulder blades  Painful or persistently difficult swallowing  New shortness of breath  Black, tarry-looking or red, bloody stools  FOLLOW UP:  If any biopsies were taken you will be contacted by phone or by letter within the next 1-3 weeks. Call [phone]  if you have not heard about the biopsies in 3 weeks.  Please also call with any specific questions about appointments or follow up tests.

## 2015-12-09 NOTE — Op Note (Signed)
[facility] Patient Name: Jillian Escobar Procedure Date: 12/09/2015 MRN: [id number] Attending MD: Carlota Raspberry. Jonnatan Hanners MD, MD Date of Birth: 11-20-1950 CSN: [id number] Age: 65 Admit Type: Outpatient Procedure:                Upper GI endoscopy Indications:              EMR of large gastric polyps (hyperplastic) Providers:                Carlota Raspberry. Aedyn Kempfer MD, MD, Hilma Favors, RN,                            Ralene Bathe, Technician, Herbie Drape, CRNA Referring MD:              Medicines:                Monitored Anesthesia Care Complications:            No immediate complications. Estimated blood loss:                            Minimal. Estimated Blood Loss:     Estimated blood loss was minimal. Procedure:                Pre-Anesthesia Assessment:                           - Prior to the procedure, a History and Physical                            was performed, and patient medications and                            allergies were reviewed. The patient's tolerance of                            previous anesthesia was also reviewed. The risks                            and benefits of the procedure and the sedation                            options and risks were discussed with the patient.                            All questions were answered, and informed consent                            was obtained. Prior Anticoagulants: The patient has                            taken no previous anticoagulant or antiplatelet                            agents. ASA Grade Assessment: II - A patient with  mild systemic disease. After reviewing the risks                            and benefits, the patient was deemed in                            satisfactory condition to undergo the procedure.                           After obtaining informed consent, the endoscope was                            passed under direct vision. Throughout the                   procedure, the patient's blood pressure, pulse, and                            oxygen saturations were monitored continuously. The                            Endoscope was introduced through the mouth, and                            advanced to the second part of duodenum. The upper                            GI endoscopy was accomplished without difficulty.                            The patient tolerated the procedure well. Scope In: Scope Out: Findings:      Esophagogastric landmarks were identified: the Z-line was found at 34       cm, the gastroesophageal junction was found at 34 cm and the upper       extent of the gastric folds was found at 37 cm from the incisors.      A 3 cm hiatal hernia was present.      The exam of the esophagus was otherwise normal.      Two large sessile polyps were found in the gastric fundus. For the       largest polyp, the area was successfully injected with saline for a lift       polypectomy. A few passes were made on the distal aspect using a hot       snare. The polyp was very soft and friable. There was some mild oozing       which eventually stopped on its own. I then elected to try and remove       some of the more proximal aspect of the largest polyp. Despite using       cautery there was persisting oozing after the first pass. This was       monitored for several minutes, further use of cautery did not stop the       oozing. 2 hemostasis clips were placed for hemostasis. The resected       tissue was retrieved but the polypectomy was incomplete, I elected to  stop the procedure at this point given the friability of these lesions       and risk for further bleeding. There was no bleeding at the end of the       procedure. No attempts were made to remove the smaller of the two polyps.      Multiple small sessile polyps were found in the gastric body.      The exam of the stomach was otherwise normal.      The duodenal bulb  and second portion of the duodenum were normal. Impression:               - Esophagogastric landmarks identified.                           - 3 cm hiatal hernia.                           - Two large gastric polyps in the fundus.                            Incomplete resection as outlined above due to                            bleeding. Resected tissue retrieved. Clips were                            placed for hemostasis.                           - Multiple smaller benign appearing gastric polyps.                           - Normal duodenal bulb and second portion of the                            duodenum. Moderate Sedation:      No moderate sedation, case performed with MAC Recommendation:           - Patient has a contact number available for                            emergencies. The signs and symptoms of potential                            delayed complications were discussed with the                            patient. Return to normal activities tomorrow.                            Written discharge instructions were provided to the                            patient.                           - Liquid diet today, advance as  tolerated tomorrow.                           - Continue present medications.                           - No aspirin, ibuprofen, naproxen, or other                            non-steroidal anti-inflammatory drugs for 2 weeks                            after polyp removal.                           - Await pathology results.                           - Referral to tertiary care center for polypectomy                            attempt, will discuss with patient Procedure Code(s):        --- Professional ---                           [phone], Esophagogastroduodenoscopy, flexible,                            transoral; with removal of tumor(s), polyp(s), or                            other lesion(s) by snare technique                           [id number],  Esophagogastroduodenoscopy, flexible,                            transoral; with directed submucosal injection(s),                            any substance Diagnosis Code(s):        --- Professional ---                           K44.9, Diaphragmatic hernia without obstruction or                            gangrene                           K31.7, Polyp of stomach and duodenum CPT copyright 2016 American Medical Association. All rights reserved. The codes documented in this report are preliminary and upon coder review may  be revised to meet current compliance requirements. Remo Lipps P. Aubrie Lucien MD, MD 12/09/2015 8:39:20 AM This report has been signed electronically. Number of Addenda: 0

## 2015-12-09 NOTE — Anesthesia Preprocedure Evaluation (Signed)
Anesthesia Evaluation  Patient identified by MRN, date of birth, ID band Patient awake    Reviewed: Allergy & Precautions, NPO status , Patient's Chart, lab work & pertinent test results  Airway Mallampati: II  TM Distance: >3 FB Neck ROM: Full    Dental no notable dental hx.    Pulmonary neg pulmonary ROS, former smoker,    Pulmonary exam normal breath sounds clear to auscultation       Cardiovascular negative cardio ROS Normal cardiovascular exam Rhythm:Regular Rate:Normal     Neuro/Psych negative neurological ROS  negative psych ROS   GI/Hepatic Neg liver ROS, GERD  Medicated and Controlled,  Endo/Other  negative endocrine ROS  Renal/GU negative Renal ROS  negative genitourinary   Musculoskeletal negative musculoskeletal ROS (+)   Abdominal   Peds negative pediatric ROS (+)  Hematology negative hematology ROS (+)   Anesthesia Other Findings   Reproductive/Obstetrics negative OB ROS                             Anesthesia Physical Anesthesia Plan  ASA: II  Anesthesia Plan: MAC   Post-op Pain Management:    Induction: Intravenous  Airway Management Planned: Nasal Cannula  Additional Equipment:   Intra-op Plan:   Post-operative Plan:   Informed Consent: I have reviewed the patients History and Physical, chart, labs and discussed the procedure including the risks, benefits and alternatives for the proposed anesthesia with the patient or authorized representative who has indicated his/her understanding and acceptance.   Dental advisory given  Plan Discussed with:   Anesthesia Plan Comments:         Anesthesia Quick Evaluation

## 2015-12-09 NOTE — Interval H&P Note (Signed)
History and Physical Interval Note:  12/09/2015 7:30 AM  Jillian Escobar  has presented today for surgery, with the diagnosis of Gastric polyps  The various methods of treatment have been discussed with the patient and family. After consideration of risks, benefits and other options for treatment, the patient has consented to  Procedure(s): ESOPHAGOGASTRODUODENOSCOPY (EGD) WITH PROPOFOL (N/A) POLYPECTOMY (N/A) as a surgical intervention .  The patient's history has been reviewed, patient examined, no change in status, stable for surgery.  I have reviewed the patient's chart and labs.  Questions were answered to the patient's satisfaction.     Renelda Loma Calvin Chura

## 2015-12-10 ENCOUNTER — Encounter (HOSPITAL_COMMUNITY): Payer: Self-pay | Admitting: Gastroenterology

## 2015-12-10 ENCOUNTER — Other Ambulatory Visit: Payer: Self-pay | Admitting: Occupational Medicine

## 2015-12-10 ENCOUNTER — Ambulatory Visit: Payer: Self-pay

## 2015-12-10 DIAGNOSIS — M25562 Pain in left knee: Secondary | ICD-10-CM

## 2015-12-12 ENCOUNTER — Telehealth: Payer: Self-pay

## 2015-12-12 NOTE — Telephone Encounter (Signed)
Spoke to husband asked that patient call our office. Let him know we are working on appointment for EMR at Dr. Arsenio Loader at [facility].

## 2015-12-18 ENCOUNTER — Telehealth: Payer: Self-pay

## 2015-12-18 NOTE — Telephone Encounter (Signed)
[facility], they did receive the referral. Dr. Arsenio Loader is reviewing the records.

## 2016-02-03 ENCOUNTER — Other Ambulatory Visit: Payer: Self-pay | Admitting: Internal Medicine

## 2016-02-03 DIAGNOSIS — Z1231 Encounter for screening mammogram for malignant neoplasm of breast: Secondary | ICD-10-CM

## 2016-02-16 ENCOUNTER — Telehealth: Payer: Self-pay | Admitting: Family Medicine

## 2016-02-16 ENCOUNTER — Telehealth: Payer: Self-pay | Admitting: Internal Medicine

## 2016-02-16 ENCOUNTER — Other Ambulatory Visit: Payer: Self-pay | Admitting: Family Medicine

## 2016-02-16 DIAGNOSIS — Z0000 Encounter for general adult medical examination without abnormal findings: Secondary | ICD-10-CM

## 2016-02-16 MED ORDER — LEVOTHYROXINE SODIUM 50 MCG PO TABS: 50.0000 ug | ORAL_TABLET | Freq: Every day | ORAL | 0 refills | Status: DC

## 2016-02-16 NOTE — Telephone Encounter (Signed)
Pt due for cpx and lab work in March 2018. I have placed the lab orders.  Please help her to make both appointments.  Thanks!!

## 2016-02-16 NOTE — Telephone Encounter (Signed)
Pt needs new rx generic synthroid 50 mcg #30 w/refills. Pt has new pharm [facility] [address]

## 2016-02-16 NOTE — Telephone Encounter (Signed)
Sent to the pharmacy by e-scribe.  Pt due for cpx and lab work 03/2016.  Message sent to scheduling.

## 2016-02-17 NOTE — Telephone Encounter (Signed)
lmom for pt to call back

## 2016-02-20 NOTE — Telephone Encounter (Signed)
lmom for pt to call back

## 2016-02-23 NOTE — Telephone Encounter (Signed)
Pt has been sch

## 2016-02-26 ENCOUNTER — Ambulatory Visit
Admission: RE | Admit: 2016-02-26 | Discharge: 2016-02-26 | Disposition: A | Discharge status: Home / Self Care | Payer: BC Managed Care – PPO | Source: Ambulatory Visit | Attending: Internal Medicine | Admitting: Internal Medicine

## 2016-02-26 DIAGNOSIS — Z1231 Encounter for screening mammogram for malignant neoplasm of breast: Secondary | ICD-10-CM

## 2016-05-14 ENCOUNTER — Other Ambulatory Visit: Payer: Self-pay | Admitting: Internal Medicine

## 2016-05-18 ENCOUNTER — Other Ambulatory Visit (INDEPENDENT_AMBULATORY_CARE_PROVIDER_SITE_OTHER): Payer: BC Managed Care – PPO

## 2016-05-18 DIAGNOSIS — Z0000 Encounter for general adult medical examination without abnormal findings: Secondary | ICD-10-CM

## 2016-05-18 LAB — CBC WITH DIFFERENTIAL/PLATELET
BASOS PCT: 1.9 % (ref 0.0–3.0)
Basophils Absolute: 0.1 10*3/uL (ref 0.0–0.1)
Eosinophils Absolute: 0.3 10*3/uL (ref 0.0–0.7)
Eosinophils Relative: 7.1 % — ABNORMAL HIGH (ref 0.0–5.0)
HEMATOCRIT: 43 % (ref 36.0–46.0)
Hemoglobin: 14.7 g/dL (ref 12.0–15.0)
LYMPHS ABS: 2 10*3/uL (ref 0.7–4.0)
LYMPHS PCT: 41.3 % (ref 12.0–46.0)
MCHC: 34.2 g/dL (ref 30.0–36.0)
MCV: 88.5 fl (ref 78.0–100.0)
MONOS PCT: 11.1 % (ref 3.0–12.0)
Monocytes Absolute: 0.5 10*3/uL (ref 0.1–1.0)
NEUTROS ABS: 1.9 10*3/uL (ref 1.4–7.7)
NEUTROS PCT: 38.6 % — AB (ref 43.0–77.0)
PLATELETS: 323 10*3/uL (ref 150.0–400.0)
RBC: 4.86 Mil/uL (ref 3.87–5.11)
RDW: 13.1 % (ref 11.5–15.5)
WBC: 4.9 10*3/uL (ref 4.0–10.5)

## 2016-05-18 LAB — BASIC METABOLIC PANEL
BUN: 17 mg/dL (ref 6–23)
CALCIUM: 9.6 mg/dL (ref 8.4–10.5)
CHLORIDE: 105 meq/L (ref 96–112)
CO2: 28 meq/L (ref 19–32)
Creatinine, Ser: 0.93 mg/dL (ref 0.40–1.20)
GFR: 64.58 mL/min (ref >60.00)
GLUCOSE: 109 mg/dL — AB (ref 70–99)
Potassium: 4.4 mEq/L (ref 3.5–5.1)
SODIUM: 139 meq/L (ref 135–145)

## 2016-05-18 LAB — HEPATIC FUNCTION PANEL
ALK PHOS: 66 U/L (ref 39–117)
ALT: 17 U/L (ref 0–35)
AST: 16 U/L (ref 0–37)
Albumin: 4.2 g/dL (ref 3.5–5.2)
BILIRUBIN DIRECT: 0.2 mg/dL (ref 0.0–0.3)
TOTAL PROTEIN: 6.8 g/dL (ref 6.0–8.3)
Total Bilirubin: 0.7 mg/dL (ref 0.2–1.2)

## 2016-05-18 LAB — LIPID PANEL
CHOL/HDL RATIO: 4
Cholesterol: 199 mg/dL (ref 0–200)
HDL: 52 mg/dL (ref >39.00)
LDL Cholesterol: 132 mg/dL — ABNORMAL HIGH (ref 0–99)
NONHDL: 146.86
Triglycerides: 75 mg/dL (ref 0.0–149.0)
VLDL: 15 mg/dL (ref 0.0–40.0)

## 2016-05-18 LAB — TSH: TSH: 3.35 u[IU]/mL (ref 0.35–4.50)

## 2016-05-25 NOTE — Progress Notes (Addendum)
Chief Complaint  Patient presents with  . Annual Exam    HPI: Patient  Jillian Escobar  66 y.o. comes in today for Preventive Health Care visit  And Chronic disease management  Has weaned off  nexium and  Now on ranitidine  Has weaned  hrt  Some hot flushes  Fallen at times  sinc foot pain injury  Health Maintenance  Topic Date Due  . PAP SMEAR  08/22/2015  . Hepatitis C Screening  05/26/2017 (Originally [birth year])  . HIV Screening  05/26/2017 (Originally 09/25/1965)  . INFLUENZA VACCINE  08/04/2016  . MAMMOGRAM  02/25/2018  . TETANUS/TDAP  03/01/2019  . COLONOSCOPY  07/22/2019   Health Maintenance Review LIFESTYLE:  Exercise:  Limited by foot problem in boot recently  Dr bassett  Tobacco/ETS:n Alcohol: 1per night Sugar beverages:n Sleep:gooe 7-8 Drug use: no HH of 2 husb retired Work: 37 hours per week      ROS:  GEN/ HEENT: No fever, significant weight changes sweats headaches vision problems hearing changes, CV/ PULM; No chest pain shortness of breath cough, syncope,edema  change in exercise tolerance. GI /GU: No adominal pain, vomiting, change in bowel habits. No blood in the stool. No significant GU symptoms. SKIN/HEME: ,no acute skin rashes suspicious lesions or bleeding. No lymphadenopathy, nodules, masses.  NEURO/ PSYCH:  No neurologic signs such as weakness numbness. No depression anxiety. IMM/ Allergy: No unusual infections.  Allergy .   REST of 12 system review negative except as per HPI   Past Medical History:  Diagnosis Date  . Allergy   . Arthritis   . GERD (gastroesophageal reflux disease)   . Hx of varicella   . Hyperlipidemia    borderline  . Hypothyroidism   . Pneumonia 11/2014   Only time took inhalers to treat  . Shingles 10/26/2010  . Syncope and collapse    Unclear etiology   . Thyroid disease     Past Surgical History:  Procedure Laterality Date  . CESAREAN SECTION     x1  . CHOLECYSTECTOMY    . COLONOSCOPY    .  ESOPHAGOGASTRODUODENOSCOPY (EGD) WITH PROPOFOL N/A 12/09/2015   Procedure: ESOPHAGOGASTRODUODENOSCOPY (EGD) WITH PROPOFOL;  Surgeon: Manus Gunning, MD;  Location: [facility];  Service: Gastroenterology;  Laterality: N/A;  . EXPLORATORY LAPAROTOMY     Gallbladder  . POLYPECTOMY N/A 12/09/2015   Procedure: POLYPECTOMY;  Surgeon: Manus Gunning, MD;  Location: [facility];  Service: Gastroenterology;  Laterality: N/A;  . TUBAL LIGATION    . WISDOM TOOTH EXTRACTION      Family History  Problem Relation Age of Onset  . Heart disease Mother        stent in 24 s  . COPD Father   . Heart disease Father        cabg in 22s   . Glaucoma Father   . Dementia Father   . Ulcerative colitis Sister   . Ulcerative colitis Brother   . Arrhythmia Daughter   . Colon cancer Cousin   . Colon polyps Neg Hx   . Esophageal cancer Neg Hx   . Rectal cancer Neg Hx   . Stomach cancer Neg Hx     Social History   Social History  . Marital status: Married    Spouse name: N/A  . Number of children: 1  . Years of education: N/A   Social History Main Topics  . Smoking status: Former Smoker    Quit date: 01/04/1982  . Smokeless  tobacco: Never Used  . Alcohol use 0.0 oz/week     Comment: daily- 2 a night   . Drug use: No  . Sexual activity: Not Asked   Other Topics Concern  . None   Social History Narrative   H H of 2    Married, G2P1   Former tobacco;drinks beer wine   Walks maybe 30 minutes or more days a week. Doesn't do more than that because she is "lazy"   Pet 2 dog and 2 cats .   TA [employer]   Originally from [place], moved over 20 years ago    Outpatient Medications Prior to Visit  Medication Sig Dispense Refill  . Ascorbic Acid (VITAMIN C PO) Take 1 tablet by mouth daily.    [name] estrogen, conjugated,-medroxyprogesterone (PREMPRO) 0.3-1.5 MG tablet Take 1 tablet by mouth daily. And decrease dose as directed (Patient taking differently:  Take 1 tablet by mouth once a week. And decrease dose as directed) 84 tablet 2  . ibuprofen (ADVIL,MOTRIN) 800 MG tablet Take 800 mg by mouth 2 (two) times daily as needed for headache or moderate pain.    [name] levothyroxine (SYNTHROID, LEVOTHROID) 50 MCG tablet TAKE 1 TABLET (50 MCG TOTAL) BY MOUTH DAILY. 90 tablet 0  . MAGNESIUM PO Take 1 tablet by mouth daily.    . Multiple Vitamin (MULTIVITAMIN WITH MINERALS) TABS tablet Take 1 tablet by mouth daily.    [name] OVER THE COUNTER MEDICATION Place 1 Dose under the tongue 2 (two) times daily. CBD oil    . pseudoephedrine (SUDAFED) 30 MG tablet Take 30 mg by mouth every 4 (four) hours as needed for congestion.    . ranitidine (ZANTAC) 150 MG tablet Take 150 mg by mouth 2 (two) times daily as needed for heartburn.    . esomeprazole (NEXIUM) 40 MG capsule Take 1 capsule (40 mg total) by mouth every other day. (Patient taking differently: Take 40 mg by mouth daily as needed (acid reflux). ) 90 capsule 1  . oxymetazoline (AFRIN) 0.05 % nasal spray Place 1 spray into both nostrils 2 (two) times daily as needed for congestion.    . Pseudoeph-Doxylamine-DM-APAP (NYQUIL PO) Take 1 Dose by mouth at bedtime as needed (cold symptoms).     No facility-administered medications prior to visit.      EXAM:  BP 130/74   Pulse 68   Temp 98.2 F (36.8 C) (Oral)   Ht 5\' 4"  (1.626 m)   Wt 177 lb 9.6 oz (80.6 kg)   BMI 30.48 kg/m   Body mass index is 30.48 kg/m. Wt Readings from Last 3 Encounters:  05/26/16 177 lb 9.6 oz (80.6 kg)  12/09/15 179 lb (81.2 kg)  10/08/15 179 lb (81.2 kg)    Physical Exam: Vital signs reviewed MWU:XLKG is a well-developed well-nourished alert cooperative    who appearsr stated age in no acute distress.  HEENT: normocephalic atraumatic , Eyes: PERRL EOM's full, conjunctiva clear, Nares: paten,t no deformity discharge or tenderness., Ears: no deformity EAC's clear TMs with normal landmarks. Mouth: clear OP, no lesions, edema.  Moist  mucous membranes. Dentition in adequate repair. NECK: supple without masses, thyromegaly or bruits. CHEST/PULM:  Clear to auscultation and percussion breath sounds equal no wheeze , rales or rhonchi. No chest wall deformities or tenderness. Breast: normal by inspection . No dimpling, discharge, masses, tenderness or discharge . CV: PMI is nondisplaced, S1 S2 no gallops, murmurs, rubs. Peripheral pulses are full  without delay.No JVD .  ABDOMEN: Bowel sounds normal nontender  No guard or rebound, no hepato splenomegal no CVA tenderness.  No hernia. Extremtities:  No clubbing cyanosis or edema, no acute joint swelling or redness no focal atrophy NEURO:  Oriented x3, cranial nerves 3-12 appear to be intact, no obvious focal weakness,gait within normal limits no abnormal reflexes or asymmetrical SKIN: No acute rashes normal turgor, color, no bruising or petechiae. PSYCH: Oriented, good eye contact, no obvious depression anxiety, cognition and judgment appear normal. LN: no cervical axillary inguinal adenopathy  Lab Results  Component Value Date   WBC 4.9 05/18/2016   HGB 14.7 05/18/2016   HCT 43.0 05/18/2016   PLT 323.0 05/18/2016   GLUCOSE 109 (H) 05/18/2016   CHOL 199 05/18/2016   TRIG 75.0 05/18/2016   HDL 52.00 05/18/2016   LDLDIRECT 158.4 08/14/2012   LDLCALC 132 (H) 05/18/2016   ALT 17 05/18/2016   AST 16 05/18/2016   NA 139 05/18/2016   K 4.4 05/18/2016   CL 105 05/18/2016   CREATININE 0.93 05/18/2016   BUN 17 05/18/2016   CO2 28 05/18/2016   TSH 3.35 05/18/2016    BP Readings from Last 3 Encounters:  05/26/16 130/74  12/09/15 (!) 151/79  10/08/15 140/75    Lab results reviewed with patient   ASSESSMENT AND PLAN:  Discussed the following assessment and plan:  Visit for preventive health examination  Medication management  Fasting hyperglycemia  Hypothyroidism, unspecified type  Hx of falling - poss related to foot inj  disc pt option with her ortho etc   Pap    Next year  utd on colon screen  Counseled healthy eating prevention diabetes blood pressure control limit alcohol to safe levels trying to lose weight. Mom has dementia some stress in the family.  Medicine she is on now is the ranitidine and the thyroid med Patient Care Team: Burnis Medin, MD as PCP - General Patient Instructions  bp goals are now below 135/85  If not able to get down with   Life style  Then could  add medication  Due for PAP next year  consider getting  shingrix   New shingles vaccine  Healthy lifestyle includes : At least 150 minutes of exercise weeks  , weight at healthy levels, which is usually   BMI 19-25. Avoid trans fats and processed foods;  Increase fresh fruits and veges to 5 servings per day. And avoid sweet beverages including tea and juice. Mediterranean diet with olive oil and nuts have been noted to be heart and brain healthy . Avoid tobacco products . Limit  alcohol to  7 per week for women and 14 servings for men.  Get adequate sleep . Wear seat belts . Don't text and drive .     Standley Brooking. Gerrard Crystal M.D.

## 2016-05-26 ENCOUNTER — Encounter: Payer: Self-pay | Admitting: Internal Medicine

## 2016-05-26 ENCOUNTER — Ambulatory Visit (INDEPENDENT_AMBULATORY_CARE_PROVIDER_SITE_OTHER): Payer: BC Managed Care – PPO | Admitting: Internal Medicine

## 2016-05-26 VITALS — BP 130/74 | HR 68 | Temp 98.2°F | Ht 64.0 in | Wt 177.6 lb

## 2016-05-26 DIAGNOSIS — E039 Hypothyroidism, unspecified: Secondary | ICD-10-CM | POA: Diagnosis not present

## 2016-05-26 DIAGNOSIS — Z0000 Encounter for general adult medical examination without abnormal findings: Secondary | ICD-10-CM

## 2016-05-26 DIAGNOSIS — Z79899 Other long term (current) drug therapy: Secondary | ICD-10-CM

## 2016-05-26 DIAGNOSIS — R7301 Impaired fasting glucose: Secondary | ICD-10-CM

## 2016-05-26 DIAGNOSIS — Z9181 History of falling: Secondary | ICD-10-CM

## 2016-05-26 NOTE — Patient Instructions (Addendum)
bp goals are now below 135/85  If not able to get down with   Life style  Then could  add medication  Due for PAP next year  consider getting  shingrix   New shingles vaccine  Healthy lifestyle includes : At least 150 minutes of exercise weeks  , weight at healthy levels, which is usually   BMI 19-25. Avoid trans fats and processed foods;  Increase fresh fruits and veges to 5 servings per day. And avoid sweet beverages including tea and juice. Mediterranean diet with olive oil and nuts have been noted to be heart and brain healthy . Avoid tobacco products . Limit  alcohol to  7 per week for women and 14 servings for men.  Get adequate sleep . Wear seat belts . Don't text and drive .

## 2016-08-09 ENCOUNTER — Other Ambulatory Visit: Payer: Self-pay | Admitting: Internal Medicine

## 2016-08-20 ENCOUNTER — Encounter: Payer: Self-pay | Admitting: Family Medicine

## 2016-08-20 ENCOUNTER — Ambulatory Visit (INDEPENDENT_AMBULATORY_CARE_PROVIDER_SITE_OTHER): Payer: BC Managed Care – PPO | Admitting: Family Medicine

## 2016-08-20 VITALS — BP 140/80 | HR 76 | Temp 97.9°F | Wt 175.5 lb

## 2016-08-20 DIAGNOSIS — L309 Dermatitis, unspecified: Secondary | ICD-10-CM

## 2016-08-20 DIAGNOSIS — R030 Elevated blood-pressure reading, without diagnosis of hypertension: Secondary | ICD-10-CM | POA: Diagnosis not present

## 2016-08-20 DIAGNOSIS — Z636 Dependent relative needing care at home: Secondary | ICD-10-CM

## 2016-08-20 DIAGNOSIS — B3789 Other sites of candidiasis: Secondary | ICD-10-CM | POA: Diagnosis not present

## 2016-08-20 MED ORDER — NYSTATIN 100000 UNIT/GM EX CREA: 1.0000 "application " | TOPICAL_CREAM | Freq: Two times a day (BID) | CUTANEOUS | 0 refills | Status: DC

## 2016-08-20 MED ORDER — HYDROXYZINE HCL 25 MG PO TABS: 25.0000 mg | ORAL_TABLET | Freq: Three times a day (TID) | ORAL | 0 refills | Status: DC | PRN

## 2016-08-20 NOTE — Patient Instructions (Addendum)
Rash A rash is a change in the color of the skin. A rash can also change the way your skin feels. There are many different conditions and factors that can cause a rash. Follow these instructions at home: Pay attention to any changes in your symptoms. Follow these instructions to help with your condition: Medicine Take or apply over-the-counter and prescription medicines only as told by your doctor. These may include:  Corticosteroid cream.  Anti-itch lotions.  Oral antihistamines.  Skin Care  Put cool compresses on the affected areas.  Try taking a bath with: ? Epsom salts. Follow the instructions on the packaging. You can get these at your local pharmacy or grocery store. ? Baking soda. Pour a small amount into the bath as told by your doctor. ? Colloidal oatmeal. Follow the instructions on the packaging. You can get this at your local pharmacy or grocery store.  Try putting baking soda paste onto your skin. Stir water into baking soda until it gets like a paste.  Do not scratch or rub your skin.  Avoid covering the rash. Make sure the rash is exposed to air as much as possible. General instructions  Avoid hot showers or baths, which can make itching worse. A cold shower may help.  Avoid scented soaps, detergents, and perfumes. Use gentle soaps, detergents, perfumes, and other cosmetic products.  Avoid anything that causes your rash. Keep a journal to help track what causes your rash. Write down: ? What you eat. ? What cosmetic products you use. ? What you drink. ? What you wear. This includes jewelry.  Keep all follow-up visits as told by your doctor. This is important. Contact a doctor if:  You sweat at night.  You lose weight.  You pee (urinate) more than normal.  You feel weak.  You throw up (vomit).  Your skin or the whites of your eyes look yellow (jaundice).  Your skin: ? Tingles. ? Is numb.  Your rash: ? Does not go away after a few days. ? Gets  worse.  You are: ? More thirsty than normal. ? More tired than normal.  You have: ? New symptoms. ? Pain in your belly (abdomen). ? A fever. ? Watery poop (diarrhea). Get help right away if:  Your rash covers all or most of your body. The rash may or may not be painful.  You have blisters that: ? Are on top of the rash. ? Grow larger. ? Grow together. ? Are painful. ? Are inside your nose or mouth.  You have a rash that: ? Looks like purple pinprick-sized spots all over your body. ? Has a "bull's eye" or looks like a target. ? Is red and painful, causes your skin to peel, and is not from being in the sun too long. This information is not intended to replace advice given to you by your health care provider. Make sure you discuss any questions you have with your health care provider. Document Released: 06/09/2007 Document Revised: 05/29/2015 Document Reviewed: 05/08/2014 Elsevier Interactive Patient Education  2018 [employer].   Helpful websites for information on Alzheimer's disease include:  Www.Alzheimers.gov https://www.[place].net/

## 2016-08-20 NOTE — Progress Notes (Signed)
Subjective:    Patient ID: Jillian Escobar, female    DOB: 05-Jul-1950, 66 y.o.   MRN: [id number]  No chief complaint on file.   HPI Patient was seen today for acute visit.  Rash: -duration a few days. -noticed first on L side of neck after waking up.  Next day noticed on R forearm -erythematous, pruritic.  Denies fever, chills, n/v, new foods, or changes in soaps/lotions. -Has tried antifungal cream at home. -Endorses working in garden (wore gloves), has a cat and a dog.  Husband recently had a rash thought to have been from the cat.  Of note: pt endorses increased stress/feeling flushed at times.  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  Pt endorses racing thoughts at night when in bed.  Currently trying to find someone to stay with her mother in the AM.  Pt endorsed feeling flushed/hot flashes, but attributed it to stress.  Is no longer taking Prempro.  Past Medical History:  Diagnosis Date  . Allergy   . Arthritis   . GERD (gastroesophageal reflux disease)   . Hx of varicella   . Hyperlipidemia    borderline  . Hypothyroidism   . Pneumonia 11/2014   Only time took inhalers to treat  . Shingles 10/26/2010  . Syncope and collapse    Unclear etiology   . Thyroid disease     Past Surgical History:  Procedure Laterality Date  . CESAREAN SECTION     x1  . CHOLECYSTECTOMY    . COLONOSCOPY    . ESOPHAGOGASTRODUODENOSCOPY (EGD) WITH PROPOFOL N/A 12/09/2015   Procedure: ESOPHAGOGASTRODUODENOSCOPY (EGD) WITH PROPOFOL;  Surgeon: Manus Gunning, MD;  Location: [facility];  Service: Gastroenterology;  Laterality: N/A;  . EXPLORATORY LAPAROTOMY     Gallbladder  . POLYPECTOMY N/A 12/09/2015   Procedure: POLYPECTOMY;  Surgeon: Manus Gunning, MD;  Location: [facility];  Service: Gastroenterology;  Laterality: N/A;  . TUBAL LIGATION    . WISDOM TOOTH EXTRACTION      Family History  Problem Relation Age of Onset  . Heart disease  Mother        stent in 63 s  . COPD Father   . Heart disease Father        cabg in 85s   . Glaucoma Father   . Dementia Father   . Ulcerative colitis Sister   . Ulcerative colitis Brother   . Arrhythmia Daughter   . Colon cancer Cousin   . Colon polyps Neg Hx   . Esophageal cancer Neg Hx   . Rectal cancer Neg Hx   . Stomach cancer Neg Hx     Social History   Social History  . Marital status: Married    Spouse name: N/A  . Number of children: 1  . Years of education: N/A   Occupational History  . Not on file.   Social History Main Topics  . Smoking status: Former Smoker    Quit date: 01/04/1982  . Smokeless tobacco: Never Used  . Alcohol use 0.0 oz/week     Comment: daily- 2 a night   . Drug use: No  . Sexual activity: Not on file   Other Topics Concern  . Not on file   Social History Narrative   H H of 2    Married, G2P1   Former tobacco;drinks beer wine   Walks maybe 30 minutes or more days a week. Doesn't do more than that because she  is "lazy"   Pet 2 dog and 2 cats .   TA [employer]   Originally from [place], moved over 20 years ago    Outpatient Medications Prior to Visit  Medication Sig Dispense Refill  . Ascorbic Acid (VITAMIN C PO) Take 1 tablet by mouth daily.    . DUEXIS 800-26.6 MG TABS Take 1 tablet by mouth 2 (two) times daily.  2  . hydrocortisone 2.5 % ointment 1 APPLICATION APPLY ON THE SKIN TWICE A DAY FOR EYELID DERMATITIS  2  . ibuprofen (ADVIL,MOTRIN) 800 MG tablet Take 800 mg by mouth 2 (two) times daily as needed for headache or moderate pain.    [name] levothyroxine (SYNTHROID, LEVOTHROID) 50 MCG tablet TAKE 1 TABLET (50 MCG TOTAL) BY MOUTH DAILY. 90 tablet 2  . MAGNESIUM PO Take 1 tablet by mouth daily.    . Multiple Vitamin (MULTIVITAMIN WITH MINERALS) TABS tablet Take 1 tablet by mouth daily.    [name] OVER THE COUNTER MEDICATION Place 1 Dose under the tongue 2 (two) times daily. CBD oil    . oxymetazoline  (AFRIN) 0.05 % nasal spray Place 1 spray into both nostrils 2 (two) times daily as needed for congestion.    . pseudoephedrine (SUDAFED) 30 MG tablet Take 30 mg by mouth every 4 (four) hours as needed for congestion.    . ranitidine (ZANTAC) 150 MG tablet Take 150 mg by mouth 2 (two) times daily as needed for heartburn.    . estrogen, conjugated,-medroxyprogesterone (PREMPRO) 0.3-1.5 MG tablet Take 1 tablet by mouth daily. And decrease dose as directed (Patient taking differently: Take 1 tablet by mouth once a week. And decrease dose as directed) 84 tablet 2   No facility-administered medications prior to visit.     No Known Allergies  ROS  General: Denies fever, chills, night sweats, changes in weight, changes in appetite  +changes in sleep HEENT: Denies headaches, ear pain, changes in vision, rhinorrhea, sore throat CV: Denies CP, palpitations, SOB, orthopnea Pulm: Denies SOB, cough, wheezing GI: Denies abdominal pain, nausea, vomiting, diarrhea, constipation GU: Denies dysuria, hematuria, frequency, vaginal discharge Msk: Denies muscle cramps, joint pains Neuro: Denies weakness, numbness, tingling Skin: Denies bruising  +rash Psych: Denies depression, anxiety, hallucinations  +anxiety, stress      Objective:    Blood pressure 140/80, pulse 76, temperature 97.9 F (36.6 C), temperature source Oral, weight 175 lb 8 oz (79.6 kg).   Gen. Pleasant, well-nourished, in no distress, normal affect.  Tearful when talking about her mother. HEENT: [place]/AT, no lesions, face symmetric, no scleral icterus, PERRLA, no post nasal drip Lungs: no accessory muscle uss, CTAB, no wheezes or rales Cardiovascular: RR, heart sounds  normal, no m/r/g, no peripheral edema Abdomen: soft and non-tender, no hepatosplenomegaly, BS normal. Musculoskeletal: No deformities, no cyanosis or clubbing, normal tone Neuro:  A&Ox3, CN II-XII intact, normal gait Skin:  Warm, dry, intact.  Skin moist between breasts.  Satellite lesions noted between Between and underneath breast.   Small circumscribed area on right forearm slightly erythematous, a 3 x 2 centimeters.  Area on left side neck slightly erythematous not raised, dry appearing in center.  No lesions between fingers.   Wt Readings from Last 3 Encounters:  08/20/16 175 lb 8 oz (79.6 kg)  05/26/16 177 lb 9.6 oz (80.6 kg)  12/09/15 179 lb (81.2 kg)    Diabetic Foot Exam - Simple   No data filed     Lab  Results  Component Value Date   WBC 4.9 05/18/2016   HGB 14.7 05/18/2016   HCT 43.0 05/18/2016   PLT 323.0 05/18/2016   GLUCOSE 109 (H) 05/18/2016   CHOL 199 05/18/2016   TRIG 75.0 05/18/2016   HDL 52.00 05/18/2016   LDLDIRECT 158.4 08/14/2012   LDLCALC 132 (H) 05/18/2016   ALT 17 05/18/2016   AST 16 05/18/2016   NA 139 05/18/2016   K 4.4 05/18/2016   CL 105 05/18/2016   CREATININE 0.93 05/18/2016   BUN 17 05/18/2016   CO2 28 05/18/2016   TSH 3.35 05/18/2016    Assessment/Plan:  Dermatitis  -Pt has Hydrocortisone 2.5% cream at home.  Will use BID for area on L side of neck and right forearm.  No new rx written. -hydrOXYzine (ATARAX/VISTARIL) 25 MG tablet for itching.  Candidiasis of breast -  -keep area dry  -Plan: nystatin cream (MYCOSTATIN)  Caregiver stress -mother with Alzheimer's. -Discussed support groups/ given handouts on Alzheimer's association. -Discussed benefits of counseling.  Pt interested in appointment with Dr. Glennon Hamilton.  Given info.  Elevated BP -possibly 2/2 increased stress, current acute illness. -discussed f/u in 1 month w/ pcp for recheck -lifestyle modifications encouraged -Consider meds if elevated at next OFV.  If still having hot flashes consider clonidine.

## 2016-08-25 ENCOUNTER — Ambulatory Visit (INDEPENDENT_AMBULATORY_CARE_PROVIDER_SITE_OTHER): Payer: BC Managed Care – PPO | Admitting: Clinical

## 2016-08-25 DIAGNOSIS — F4323 Adjustment disorder with mixed anxiety and depressed mood: Secondary | ICD-10-CM | POA: Diagnosis not present

## 2016-08-29 NOTE — Progress Notes (Signed)
Chief Complaint  Patient presents with  . Follow-up    Pt states the rash on her right arm has not improved much and has some reddness and itching, the rash inbetween her breast has improved     HPI: Jillian Escobar 66 y.o. comes in today for follow-up of a rash that hasn't gotten better after treatment given by Dr. Volanda Napoleon. Her intertriginous rash is resolved however the rash on her right arm that is very itchy become larger. She's been using the 2.5% hydrocortisone and hydroxyzine at night which does help her sleep somewhat. However she states she needs help she is very distressed because her mom is having rapidly declining dementia Alzheimer's is been very difficult for her recently. There is no fever Her husband did have a patch of round red itchy rash at the back of his neck that we treated with antifungals and steroids getting better. No recent other exposures. Doesn't know why this itches so badly that it keeps her up. It is becoming larger. Stated started as just a couple centimeters and now is a large area of her arm.    Her stress is gray difficult to sleep because of anxiety and thinking about things asked for something to "take the edge off she has begun counseling doesn't have something takes every day she used Ambien short-term long in the past and cause grogginess the next day did not want to take something like that.Asks about Xanax.  ROS: See pertinent positives and negatives per HPI. Father passed from dementia   Fraser Din says neg testing for Alzheimer genes   Past Medical History:  Diagnosis Date  . Allergy   . Arthritis   . GERD (gastroesophageal reflux disease)   . Hx of varicella   . Hyperlipidemia    borderline  . Hypothyroidism   . Pneumonia 11/2014   Only time took inhalers to treat  . Shingles 10/26/2010  . Syncope and collapse    Unclear etiology   . Thyroid disease     Family History  Problem Relation Age of Onset  . Heart disease Mother        stent  in 32 s  . COPD Father   . Heart disease Father        cabg in 13s   . Glaucoma Father   . Dementia Father   . Ulcerative colitis Sister   . Ulcerative colitis Brother   . Arrhythmia Daughter   . Colon cancer Cousin   . Colon polyps Neg Hx   . Esophageal cancer Neg Hx   . Rectal cancer Neg Hx   . Stomach cancer Neg Hx     Social History   Social History  . Marital status: Married    Spouse name: N/A  . Number of children: 1  . Years of education: N/A   Social History Main Topics  . Smoking status: Former Smoker    Quit date: 01/04/1982  . Smokeless tobacco: Never Used  . Alcohol use 0.0 oz/week     Comment: daily- 2 a night   . Drug use: No  . Sexual activity: Not Asked   Other Topics Concern  . None   Social History Narrative   H H of 2    Married, G2P1   Former tobacco;drinks beer wine   Walks maybe 30 minutes or more days a week. Doesn't do more than that because she is "lazy"   Pet 2 dog and 2 cats .   TA  [employer]   Originally from [place], moved over 20 years ago    Outpatient Medications Prior to Visit  Medication Sig Dispense Refill  . Ascorbic Acid (VITAMIN C PO) Take 1 tablet by mouth daily.    . DUEXIS 800-26.6 MG TABS Take 1 tablet by mouth 2 (two) times daily.  2  . hydrocortisone 2.5 % ointment 1 APPLICATION APPLY ON THE SKIN TWICE A DAY FOR EYELID DERMATITIS  2  . hydrOXYzine (ATARAX/VISTARIL) 25 MG tablet Take 1 tablet (25 mg total) by mouth 3 (three) times daily as needed for itching. 60 tablet 0  . ibuprofen (ADVIL,MOTRIN) 800 MG tablet Take 800 mg by mouth 2 (two) times daily as needed for headache or moderate pain.    [name] levothyroxine (SYNTHROID, LEVOTHROID) 50 MCG tablet TAKE 1 TABLET (50 MCG TOTAL) BY MOUTH DAILY. 90 tablet 2  . MAGNESIUM PO Take 1 tablet by mouth daily.    . Multiple Vitamin (MULTIVITAMIN WITH MINERALS) TABS tablet Take 1 tablet by mouth daily.    [name] nystatin cream (MYCOSTATIN) Apply 1  application topically 2 (two) times daily. Apply to chest 30 g 0  . OVER THE COUNTER MEDICATION Place 1 Dose under the tongue 2 (two) times daily. CBD oil    . pseudoephedrine (SUDAFED) 30 MG tablet Take 30 mg by mouth every 4 (four) hours as needed for congestion.    . ranitidine (ZANTAC) 150 MG tablet Take 150 mg by mouth 2 (two) times daily as needed for heartburn.    [name] oxymetazoline (AFRIN) 0.05 % nasal spray Place 1 spray into both nostrils 2 (two) times daily as needed for congestion.     No facility-administered medications prior to visit.      EXAM:  BP (!) 178/110 (BP Location: Left Arm, Patient Position: Sitting, Cuff Size: Normal)   Pulse 95   Temp 98.4 F (36.9 C) (Oral)   Ht 5\' 4"  (1.626 m)   Wt 177 lb (80.3 kg)   SpO2 98%   BMI 30.38 kg/m   Body mass index is 30.38 kg/m.  GENERAL: vitals reviewed and listed above, alert, oriented, appears well hydrated and somewhat emotional distress regarding discussion about her mother's condition. HEENT: atraumatic, conjunctiva  clear, no obvious abnormalities on inspection of external nose and ears \ NECK: no obvious masses on inspection palpation  SKIN:  Right antecubital fossa with very large over 10 cm ovoid rash with some edematous edging and central flaking. No bull's-eye. The upper left arm right arm with some scattered red patches otherwise There are no vesicles or pustules. PSYCH: somewhat  distressed   Stressed  cooperative, normal thought speech.   Picture not as dramatic as   In person  ASSESSMENT AND PLAN:  Discussed the following assessment and plan:  Rash extremely pruritic - worsening after steroid rx  atypical eczema rx for tinea and inc topical stseroid potency expectant managment   Adjustment disorder with anxious mood  Caregiver stress Discussed risk-benefit of certain medicines than those are not a long-term solution but can be used in the short run as discussed. Because this is a long-term problem we  may need other interventions but she should continue counseling at this time. Plan follow-up in a few weeks if refills needed percent of picture of the rash if not getting better. This could just be contact her eczema but it's a dramatic increase in size on topical cortisone therefore even though the potency was low will  cover for tinea.  Risk benefit of medications discussed.  -Patient advised to return or notify health care team  if symptoms worsen ,persist or new concerns arise.  Patient Instructions  Can add antifungal for the  Itchy rash  In  case  Fungus related .  Add  Antifungal topical  Twice a day  And  and can use the increase .    Dose of steroid  Tmc.   also for itching   Plan fu in 2 weeks if not better or earlier if needed.    Can use leorazepam  As needed for anxiety .  This is dependent producing medication for acute anxiety  And not  A  Long term solution.         Standley Brooking. Panosh M.D.

## 2016-08-30 ENCOUNTER — Encounter: Payer: Self-pay | Admitting: Internal Medicine

## 2016-08-30 ENCOUNTER — Ambulatory Visit (INDEPENDENT_AMBULATORY_CARE_PROVIDER_SITE_OTHER): Payer: BC Managed Care – PPO | Admitting: Internal Medicine

## 2016-08-30 VITALS — BP 178/110 | HR 95 | Temp 98.4°F | Ht 64.0 in | Wt 177.0 lb

## 2016-08-30 DIAGNOSIS — R21 Rash and other nonspecific skin eruption: Secondary | ICD-10-CM | POA: Diagnosis not present

## 2016-08-30 DIAGNOSIS — F4322 Adjustment disorder with anxiety: Secondary | ICD-10-CM

## 2016-08-30 DIAGNOSIS — Z636 Dependent relative needing care at home: Secondary | ICD-10-CM

## 2016-08-30 MED ORDER — LORAZEPAM 0.5 MG PO TABS: 0.5000 mg | ORAL_TABLET | Freq: Two times a day (BID) | ORAL | 0 refills | Status: DC | PRN

## 2016-08-30 MED ORDER — KETOCONAZOLE 2 % EX CREA: 1.0000 "application " | TOPICAL_CREAM | Freq: Two times a day (BID) | CUTANEOUS | 0 refills | Status: DC

## 2016-08-30 NOTE — Patient Instructions (Addendum)
Can add antifungal for the  Itchy rash  In  case  Fungus related .  Add  Antifungal topical  Twice a day  And  and can use the increase .    Dose of steroid  Tmc.   also for itching   Plan fu in 2 weeks if not better or earlier if needed.    Can use leorazepam  As needed for anxiety .  This is dependent producing medication for acute anxiety  And not  A  Long term solution.

## 2016-09-17 ENCOUNTER — Ambulatory Visit: Payer: BC Managed Care – PPO | Admitting: Clinical

## 2016-09-24 ENCOUNTER — Encounter: Payer: Self-pay | Admitting: Internal Medicine

## 2017-05-20 ENCOUNTER — Other Ambulatory Visit: Payer: Self-pay | Admitting: Internal Medicine

## 2017-08-16 ENCOUNTER — Other Ambulatory Visit: Payer: Self-pay | Admitting: Internal Medicine

## 2017-09-12 ENCOUNTER — Ambulatory Visit (INDEPENDENT_AMBULATORY_CARE_PROVIDER_SITE_OTHER): Payer: BC Managed Care – PPO | Admitting: Internal Medicine

## 2017-09-12 ENCOUNTER — Encounter: Payer: Self-pay | Admitting: Internal Medicine

## 2017-09-12 VITALS — BP 180/90 | HR 100 | Temp 98.3°F | Wt 175.0 lb

## 2017-09-12 DIAGNOSIS — J069 Acute upper respiratory infection, unspecified: Secondary | ICD-10-CM | POA: Diagnosis not present

## 2017-09-12 DIAGNOSIS — J019 Acute sinusitis, unspecified: Secondary | ICD-10-CM

## 2017-09-12 DIAGNOSIS — R030 Elevated blood-pressure reading, without diagnosis of hypertension: Secondary | ICD-10-CM | POA: Diagnosis not present

## 2017-09-12 MED ORDER — AMOXICILLIN 500 MG PO CAPS: 500.0000 mg | ORAL_CAPSULE | Freq: Three times a day (TID) | ORAL | 0 refills | Status: DC

## 2017-09-12 NOTE — Progress Notes (Signed)
Chief Complaint  Patient presents with  . Nasal Congestion    scratch throat, headache, cough, drainage, head congestion, took OTC sudafed    HPI: Jillian Escobar 67 y.o.    Onset    3 days .   Ur sore throat and then serious ur congestion had bad ha frontallast night no fever pos coughing  . Exposed public and  In schools  No fever  Sob but spasm of cough using  netti pot  And afrin some  Blood in mucous noted   ROS: See pertinent positives and negatives per HPI.  fyi gets r jaw pain when getting heart burn and gets better with  Zantac   Past Medical History:  Diagnosis Date  . Allergy   . Arthritis   . GERD (gastroesophageal reflux disease)   . Hx of varicella   . Hyperlipidemia    borderline  . Hypothyroidism   . Pneumonia 11/2014   Only time took inhalers to treat  . Shingles 10/26/2010  . Syncope and collapse    Unclear etiology   . Thyroid disease     Family History  Problem Relation Age of Onset  . Heart disease Mother        stent in 68 s  . COPD Father   . Heart disease Father        cabg in 40s   . Glaucoma Father   . Dementia Father   . Ulcerative colitis Sister   . Ulcerative colitis Brother   . Arrhythmia Daughter   . Colon cancer Cousin   . Colon polyps Neg Hx   . Esophageal cancer Neg Hx   . Rectal cancer Neg Hx   . Stomach cancer Neg Hx     Social History   Socioeconomic History  . Marital status: Married    Spouse name: Not on file  . Number of children: 1  . Years of education: Not on file  . Highest education level: Not on file  Occupational History  . Not on file  Social Needs  . Financial resource strain: Not on file  . Food insecurity:    Worry: Not on file    Inability: Not on file  . Transportation needs:    Medical: Not on file    Non-medical: Not on file  Tobacco Use  . Smoking status: Former Smoker    Last attempt to quit: 01/04/1982    Years since quitting: 35.7  . Smokeless tobacco: Never Used  Substance and Sexual  Activity  . Alcohol use: Yes    Alcohol/week: 0.0 standard drinks    Comment: daily- 2 a night   . Drug use: No  . Sexual activity: Not on file  Lifestyle  . Physical activity:    Days per week: Not on file    Minutes per session: Not on file  . Stress: Not on file  Relationships  . Social connections:    Talks on phone: Not on file    Gets together: Not on file    Attends religious service: Not on file    Active member of club or organization: Not on file    Attends meetings of clubs or organizations: Not on file    Relationship status: Not on file  Other Topics Concern  . Not on file  Social History Narrative   H H of 2    Married, G2P1   Former tobacco;drinks beer wine   Walks maybe 30 minutes or more days  a week. Doesn't do more than that because she is "lazy"   Pet 2 dog and 2 cats .   [employer]   Originally from [place], moved over 20 years ago    Outpatient Medications Prior to Visit  Medication Sig Dispense Refill  . Ascorbic Acid (VITAMIN C PO) Take 1 tablet by mouth daily.    . DUEXIS 800-26.6 MG TABS Take 1 tablet by mouth 2 (two) times daily.  2  . hydrocortisone 2.5 % ointment 1 APPLICATION APPLY ON THE SKIN TWICE A DAY FOR EYELID DERMATITIS  2  . hydrOXYzine (ATARAX/VISTARIL) 25 MG tablet Take 1 tablet (25 mg total) by mouth 3 (three) times daily as needed for itching. 60 tablet 0  . ibuprofen (ADVIL,MOTRIN) 800 MG tablet Take 800 mg by mouth 2 (two) times daily as needed for headache or moderate pain.    [name] ketoconazole (NIZORAL) 2 % cream Apply 1 application topically 2 (two) times daily. 30 g 0  . levothyroxine (SYNTHROID, LEVOTHROID) 50 MCG tablet TAKE 1 TABLET BY MOUTH EVERY DAY 90 tablet 1  . LORazepam (ATIVAN) 0.5 MG tablet Take 1-2 tablets (0.5-1 mg total) by mouth 2 (two) times daily as needed for anxiety. 20 tablet 0  . MAGNESIUM PO Take 1 tablet by mouth daily.    . Multiple Vitamin (MULTIVITAMIN WITH MINERALS) TABS  tablet Take 1 tablet by mouth daily.    [name] nystatin cream (MYCOSTATIN) Apply 1 application topically 2 (two) times daily. Apply to chest 30 g 0  . OVER THE COUNTER MEDICATION Place 1 Dose under the tongue 2 (two) times daily. CBD oil    . oxymetazoline (AFRIN) 0.05 % nasal spray Place 1 spray into both nostrils 2 (two) times daily as needed for congestion.    . pseudoephedrine (SUDAFED) 30 MG tablet Take 30 mg by mouth every 4 (four) hours as needed for congestion.    . ranitidine (ZANTAC) 150 MG tablet Take 150 mg by mouth 2 (two) times daily as needed for heartburn.     No facility-administered medications prior to visit.      EXAM:  BP (!) 180/90 (BP Location: Left Arm, Patient Position: Sitting, Cuff Size: Normal)   Pulse 100   Temp 98.3 F (36.8 C) (Oral)   Wt 175 lb (79.4 kg)   SpO2 97%   BMI 30.04 kg/m   Body mass index is 30.04 kg/m. WDWN in NAD  quiet respirations; very  congested  somewhat hoarse. Non toxic . HEENT: Normocephalic ;atraumatic , Eyes;  PERRL, EOMs  Full, lids and conjunctiva clear,,Ears: no deformities, canals nl, TM landmarks normal, Nose: no deformity or discharge but congested;face upper frontal mild  tender Mouth : OP clear without lesion or edema . Mild erythema   Neck: Supple without adenopathy or masses or bruits tendner ac  Nodes not enlarged  Chest:  Clear to A without wheezes rales or rhonchi CV:  S1-S2 no gallops or murmurs peripheral perfusion is normal Skin :nl perfusion and no acute rashes   PSYCH: pleasant and cooperative, no obvious depression or anxiety   ASSESSMENT AND PLAN:  Discussed the following assessment and plan:  URI, acute  Acute sinusitis, recurrence not specified, unspecified location  Elevated blood pressure reading bp up today  Told patient tomake sure comes down  Acts like acute uri with sinus   Involvement  If  Frontal  Pain persists add antibiotic   Other wise -Patient advised to return  or notify health care team   if symptoms worsen ,persist or new concerns arise. After patient l;eft reviewed record  Need to document  In goal BP and  Other issues  Last cpx with me was 5 18  A Patient Instructions  Warm compresses  As you are doing saline    Decongestant  Sudafed like   Plain mucinex   .   If sinus pain   Over 48   72 hours without relief then .  Add antibiotic    consider  seeing j  Gi  If ongoing  jaw.   Relieved by  Zantac.    Standley Brooking. Panosh M.D.

## 2017-09-12 NOTE — Patient Instructions (Addendum)
Warm compresses  As you are doing saline    Decongestant  Sudafed like   Plain mucinex   .   If sinus pain   Over 48   72 hours without relief then .  Add antibiotic    consider  seeing j  Gi  If ongoing  jaw.   Relieved by  Zantac.

## 2017-09-14 ENCOUNTER — Ambulatory Visit: Payer: Self-pay | Admitting: Internal Medicine

## 2017-09-19 ENCOUNTER — Ambulatory Visit: Payer: Self-pay | Admitting: Internal Medicine

## 2017-09-19 NOTE — Progress Notes (Signed)
Chief Complaint  Patient presents with  . Follow-up    Not much improvement since last OV. Still having congestion and little cough. Worse in AM and at night when laying down to sleep. No mucous production or fever. Pt is leaving for [place] on Saturday and wants to be better before flying.     HPI: Jillian Escobar 67 y.o. come in for going  Ur congestion    minimal cough but head still has  Pressure  Mostly frontal    Hard to  Taste   Ear .  Right bothering her.  To go on plane  this week No fever but serious pressure in head and headache   Cough  Not too bad    started antibiotic    And has 2 more days   BP better      ROS: See pertinent positives and negatives per HPI.  Past Medical History:  Diagnosis Date  . Allergy   . Arthritis   . GERD (gastroesophageal reflux disease)   . Hx of varicella   . Hyperlipidemia    borderline  . Hypothyroidism   . Pneumonia 11/2014   Only time took inhalers to treat  . Shingles 10/26/2010  . Syncope and collapse    Unclear etiology   . Thyroid disease     Family History  Problem Relation Age of Onset  . Heart disease Mother        stent in 80 s  . COPD Father   . Heart disease Father        cabg in 46s   . Glaucoma Father   . Dementia Father   . Ulcerative colitis Sister   . Ulcerative colitis Brother   . Arrhythmia Daughter   . Colon cancer Cousin   . Colon polyps Neg Hx   . Esophageal cancer Neg Hx   . Rectal cancer Neg Hx   . Stomach cancer Neg Hx     Social History   Socioeconomic History  . Marital status: Married    Spouse name: Not on file  . Number of children: 1  . Years of education: Not on file  . Highest education level: Not on file  Occupational History  . Not on file  Social Needs  . Financial resource strain: Not on file  . Food insecurity:    Worry: Not on file    Inability: Not on file  . Transportation needs:    Medical: Not on file    Non-medical: Not on file  Tobacco Use  . Smoking  status: Former Smoker    Last attempt to quit: 01/04/1982    Years since quitting: 35.7  . Smokeless tobacco: Never Used  Substance and Sexual Activity  . Alcohol use: Yes    Alcohol/week: 0.0 standard drinks    Comment: daily- 2 a night   . Drug use: No  . Sexual activity: Not on file  Lifestyle  . Physical activity:    Days per week: Not on file    Minutes per session: Not on file  . Stress: Not on file  Relationships  . Social connections:    Talks on phone: Not on file    Gets together: Not on file    Attends religious service: Not on file    Active member of club or organization: Not on file    Attends meetings of clubs or organizations: Not on file    Relationship status: Not on file  Other Topics  Concern  . Not on file  Social History Narrative   H H of 2    Married, G2P1   Former tobacco;drinks beer wine   Walks maybe 30 minutes or more days a week. Doesn't do more than that because she is "lazy"   Pet 2 dog and 2 cats .   [employer]   Originally from [place], moved over 20 years ago    Outpatient Medications Prior to Visit  Medication Sig Dispense Refill  . amoxicillin (AMOXIL) 500 MG capsule Take 1 capsule (500 mg total) by mouth 3 (three) times daily. If needed for sinusitis 21 capsule 0  . Ascorbic Acid (VITAMIN C PO) Take 1 tablet by mouth daily.    . DUEXIS 800-26.6 MG TABS Take 1 tablet by mouth 2 (two) times daily.  2  . hydrocortisone 2.5 % ointment 1 APPLICATION APPLY ON THE SKIN TWICE A DAY FOR EYELID DERMATITIS  2  . hydrOXYzine (ATARAX/VISTARIL) 25 MG tablet Take 1 tablet (25 mg total) by mouth 3 (three) times daily as needed for itching. 60 tablet 0  . ibuprofen (ADVIL,MOTRIN) 800 MG tablet Take 800 mg by mouth 2 (two) times daily as needed for headache or moderate pain.    [name] ketoconazole (NIZORAL) 2 % cream Apply 1 application topically 2 (two) times daily. 30 g 0  . levothyroxine (SYNTHROID, LEVOTHROID) 50 MCG tablet  TAKE 1 TABLET BY MOUTH EVERY DAY 90 tablet 1  . LORazepam (ATIVAN) 0.5 MG tablet Take 1-2 tablets (0.5-1 mg total) by mouth 2 (two) times daily as needed for anxiety. 20 tablet 0  . MAGNESIUM PO Take 1 tablet by mouth daily.    . Multiple Vitamin (MULTIVITAMIN WITH MINERALS) TABS tablet Take 1 tablet by mouth daily.    [name] nystatin cream (MYCOSTATIN) Apply 1 application topically 2 (two) times daily. Apply to chest 30 g 0  . OVER THE COUNTER MEDICATION Place 1 Dose under the tongue 2 (two) times daily. CBD oil    . oxymetazoline (AFRIN) 0.05 % nasal spray Place 1 spray into both nostrils 2 (two) times daily as needed for congestion.    . pseudoephedrine (SUDAFED) 30 MG tablet Take 30 mg by mouth every 4 (four) hours as needed for congestion.    . ranitidine (ZANTAC) 150 MG tablet Take 150 mg by mouth 2 (two) times daily as needed for heartburn.     No facility-administered medications prior to visit.      EXAM:  BP (!) 142/80 (BP Location: Left Arm, Patient Position: Sitting, Cuff Size: Normal)   Pulse 79   Temp 98 F (36.7 C) (Oral)   Wt 174 lb (78.9 kg)   SpO2 97%   BMI 29.87 kg/m   Body mass index is 29.87 kg/m.  GENERAL: vitals reviewed and listed above, alert, oriented, appears well hydrated and in no acute distress very congested    Non toxic  HEENT: atraumatic, conjunctiva  clear, no obvious abnormalities on inspection of external nose and earstm nad  OP : no lesion edema or exudate  Face tender bifrontal and some right max   NECK: no obvious masses on inspection palpation  LUNGS: clear to auscultation bilaterally, no wheezes, rales or rhonchi, CV: HRRR, no clubbing cyanosis or  peripheral edema nl cap refill  MS: moves all extremities without noticeable focal  abnormality PSYCH: pleasant and cooperative, no obvious depression or anxiety  BP Readings from Last 3 Encounters:  09/20/17 [name])  142/80  09/12/17 (!) 180/90  08/30/16 (!) 178/110    ASSESSMENT AND  PLAN:  Discussed the following assessment and plan:  Frontal sinusitis, unspecified chronicity - Plan: methylPREDNISolone acetate (DEPO-MEDROL) injection 120 mg  Acute sinusitis, recurrence not specified, unspecified location - Plan: methylPREDNISolone acetate (DEPO-MEDROL) injection 120 mg  Elevated BP  - better  but will need fu  monitoring  after better and back from [place] Prolonged sx and should have responded by now or better if viral.  Going on plane in 4 days  Steroid rx and broader spectrum antibiotic  Benefit more than risk of medications    Counseled.   And  Expect improvement in the next 48 hours or therebouts . After patient left noted she is overdue for yearly  Lab and fu  ( thyroid etc   And can address  BP then )  -Patient advised to return or notify health care team  if  new concerns arise.  Patient Instructions  Change antibiotic to levaquin 750  Mg and  Injection of steroid  Today   Continue sinus hygiene.  May need to use decongestant before getting on plane   Prednisone for 3 -5 days    Begin tomorrow   This acts like a  sinus infection  That is not responding to conservative measures .      Standley Brooking. Treacy Holcomb M.D.

## 2017-09-20 ENCOUNTER — Ambulatory Visit (INDEPENDENT_AMBULATORY_CARE_PROVIDER_SITE_OTHER): Payer: BC Managed Care – PPO | Admitting: Internal Medicine

## 2017-09-20 ENCOUNTER — Ambulatory Visit: Payer: Self-pay | Admitting: Internal Medicine

## 2017-09-20 ENCOUNTER — Encounter: Payer: Self-pay | Admitting: Internal Medicine

## 2017-09-20 VITALS — BP 142/80 | HR 79 | Temp 98.0°F | Wt 174.0 lb

## 2017-09-20 DIAGNOSIS — R030 Elevated blood-pressure reading, without diagnosis of hypertension: Secondary | ICD-10-CM

## 2017-09-20 DIAGNOSIS — J019 Acute sinusitis, unspecified: Secondary | ICD-10-CM | POA: Diagnosis not present

## 2017-09-20 DIAGNOSIS — J321 Chronic frontal sinusitis: Secondary | ICD-10-CM

## 2017-09-20 MED ORDER — PREDNISONE 20 MG PO TABS: 20.0000 mg | ORAL_TABLET | Freq: Two times a day (BID) | ORAL | 0 refills | Status: DC

## 2017-09-20 MED ORDER — LEVOFLOXACIN 750 MG PO TABS: 750.0000 mg | ORAL_TABLET | Freq: Every day | ORAL | 0 refills | Status: DC

## 2017-09-20 MED ORDER — METHYLPREDNISOLONE ACETATE 80 MG/ML IJ SUSP
120.0000 mg | Freq: Once | INTRAMUSCULAR | Status: AC
  Administered 2017-09-20: 120 mg via INTRAMUSCULAR

## 2017-09-20 NOTE — Patient Instructions (Addendum)
Change antibiotic to levaquin 750  Mg and  Injection of steroid  Today   Continue sinus hygiene.  May need to use decongestant before getting on plane   Prednisone for 3 -5 days    Begin tomorrow   This acts like a  sinus infection  That is not responding to conservative measures .

## 2017-09-21 ENCOUNTER — Telehealth: Payer: Self-pay | Admitting: Internal Medicine

## 2017-09-21 NOTE — Telephone Encounter (Signed)
Copied from [place] [id number]. Topic: General - Other >> Sep 21, 2017  9:01 AM Carolyn Stare wrote:  Pt said she think she haa a allergic reaction to  levofloxacin (LEVAQUIN) 750 MG tablet    and is asking if she should just take the prednisone

## 2017-09-21 NOTE — Telephone Encounter (Signed)
Please advise 

## 2017-09-21 NOTE — Telephone Encounter (Signed)
Please get more information about the allergic reaction and if stops .   Stop the   Levaquin and take the prednisone

## 2017-09-23 MED ORDER — DOXYCYCLINE HYCLATE 100 MG PO TABS: 100.0000 mg | ORAL_TABLET | Freq: Two times a day (BID) | ORAL | 0 refills | Status: DC

## 2017-09-23 NOTE — Addendum Note (Signed)
Addended by: Virl Cagey on: 09/23/2017 03:26 PM   Modules accepted: Orders

## 2017-09-23 NOTE — Telephone Encounter (Addendum)
Pt had reaction to the medication given at her last OV Racing heart Restless Sleepless Palpitations Nausea SOB  Missed two days of work this week Went back to work today.   Pt has stopped the Levaquin  Pt had not started the Prednisone yet.  Pt still has 3 tabs of Amox 875 on hand which she had started taking prior to her OV which was stopped and Levaquin was started.  Pt still having drainage and ears feel plugged Pt travels in the morning 09/24/17  Pt wants to know if she needs another abx or to go ahead and take the Prednisone?? Using Sudafed every 4-6 hours helps some but she is still congested.   Please advise Dr Regis Bill, thanks.

## 2017-09-23 NOTE — Telephone Encounter (Signed)
Can try doxycycline 100 mg 1 po bid for 5 days   Disp 10  Take the prednisone     ( fyi make sure she schedules a cpx  After getting bavk when convenient so we can follow up on her  Blood pressure)

## 2017-09-23 NOTE — Telephone Encounter (Signed)
Left detailed message Doxy 100mg  sent to pharmacy as requested Pt instructed to take prednisone.  Pt instructed to call the office for CPX when she returns from vacation.   Nothing further needed.

## 2017-10-17 ENCOUNTER — Emergency Department (HOSPITAL_COMMUNITY)
Admission: EM | Admit: 2017-10-17 | Discharge: 2017-10-17 | Disposition: A | Discharge status: Home / Self Care | Payer: BC Managed Care – PPO | Attending: Emergency Medicine | Admitting: Emergency Medicine

## 2017-10-17 ENCOUNTER — Emergency Department (HOSPITAL_COMMUNITY): Payer: BC Managed Care – PPO

## 2017-10-17 ENCOUNTER — Other Ambulatory Visit: Payer: Self-pay

## 2017-10-17 ENCOUNTER — Ambulatory Visit: Payer: Self-pay | Admitting: *Deleted

## 2017-10-17 ENCOUNTER — Encounter (HOSPITAL_COMMUNITY): Payer: Self-pay

## 2017-10-17 DIAGNOSIS — J189 Pneumonia, unspecified organism: Secondary | ICD-10-CM | POA: Diagnosis not present

## 2017-10-17 DIAGNOSIS — Z79899 Other long term (current) drug therapy: Secondary | ICD-10-CM | POA: Diagnosis not present

## 2017-10-17 DIAGNOSIS — Z87891 Personal history of nicotine dependence: Secondary | ICD-10-CM | POA: Insufficient documentation

## 2017-10-17 DIAGNOSIS — E039 Hypothyroidism, unspecified: Secondary | ICD-10-CM | POA: Diagnosis not present

## 2017-10-17 DIAGNOSIS — R0602 Shortness of breath: Secondary | ICD-10-CM | POA: Diagnosis present

## 2017-10-17 DIAGNOSIS — J9801 Acute bronchospasm: Secondary | ICD-10-CM | POA: Diagnosis not present

## 2017-10-17 DIAGNOSIS — E785 Hyperlipidemia, unspecified: Secondary | ICD-10-CM | POA: Diagnosis not present

## 2017-10-17 DIAGNOSIS — R0789 Other chest pain: Secondary | ICD-10-CM

## 2017-10-17 LAB — I-STAT TROPONIN, ED: TROPONIN I, POC: 0 ng/mL (ref 0.00–0.08)

## 2017-10-17 LAB — BRAIN NATRIURETIC PEPTIDE: B Natriuretic Peptide: 85.3 pg/mL (ref 0.0–100.0)

## 2017-10-17 LAB — BASIC METABOLIC PANEL
ANION GAP: 9 (ref 5–15)
BUN: 20 mg/dL (ref 8–23)
CALCIUM: 10.3 mg/dL (ref 8.9–10.3)
CO2: 25 mmol/L (ref 22–32)
CREATININE: 0.67 mg/dL (ref 0.44–1.00)
Chloride: 109 mmol/L (ref 98–111)
GFR calc Af Amer: >60 mL/min (ref >60)
GFR calc non Af Amer: >60 mL/min (ref >60)
GLUCOSE: 111 mg/dL — AB (ref 70–99)
Potassium: 3.8 mmol/L (ref 3.5–5.1)
Sodium: 143 mmol/L (ref 135–145)

## 2017-10-17 LAB — CBC
HCT: 43.4 % (ref 36.0–46.0)
Hemoglobin: 14.9 g/dL (ref 12.0–15.0)
MCH: 30.5 pg (ref 26.0–34.0)
MCHC: 34.3 g/dL (ref 30.0–36.0)
MCV: 88.9 fL (ref 80.0–100.0)
PLATELETS: 271 10*3/uL (ref 150–400)
RBC: 4.88 MIL/uL (ref 3.87–5.11)
RDW: 12.4 % (ref 11.5–15.5)
WBC: 5.6 10*3/uL (ref 4.0–10.5)
nRBC: 0 % (ref 0.0–0.2)

## 2017-10-17 LAB — D-DIMER, QUANTITATIVE (NOT AT ARMC): D DIMER QUANT: 0.71 ug{FEU}/mL — AB (ref 0.00–0.50)

## 2017-10-17 MED ORDER — ALBUTEROL SULFATE (2.5 MG/3ML) 0.083% IN NEBU
5.0000 mg | INHALATION_SOLUTION | Freq: Once | RESPIRATORY_TRACT | Status: AC
  Administered 2017-10-17: 5 mg via RESPIRATORY_TRACT
  Filled 2017-10-17: qty 6

## 2017-10-17 MED ORDER — IOPAMIDOL (ISOVUE-370) INJECTION 76%
100.0000 mL | Freq: Once | INTRAVENOUS | Status: AC | PRN
  Administered 2017-10-17: 76 mL via INTRAVENOUS

## 2017-10-17 MED ORDER — AZITHROMYCIN 250 MG PO TABS: 250.0000 mg | ORAL_TABLET | Freq: Every day | ORAL | 0 refills | Status: DC

## 2017-10-17 MED ORDER — IOPAMIDOL (ISOVUE-370) INJECTION 76%
INTRAVENOUS | Status: AC
  Filled 2017-10-17: qty 100

## 2017-10-17 MED ORDER — SODIUM CHLORIDE 0.9 % IV SOLN
500.0000 mg | Freq: Once | INTRAVENOUS | Status: AC
  Administered 2017-10-17: 500 mg via INTRAVENOUS
  Filled 2017-10-17: qty 500

## 2017-10-17 MED ORDER — SODIUM CHLORIDE 0.9 % IJ SOLN
INTRAMUSCULAR | Status: AC
  Filled 2017-10-17: qty 50

## 2017-10-17 MED ORDER — METHYLPREDNISOLONE SODIUM SUCC 125 MG IJ SOLR
125.0000 mg | Freq: Once | INTRAMUSCULAR | Status: AC
  Administered 2017-10-17: 125 mg via INTRAVENOUS
  Filled 2017-10-17: qty 2

## 2017-10-17 MED ORDER — PREDNISONE 20 MG PO TABS: ORAL_TABLET | ORAL | 0 refills | Status: DC

## 2017-10-17 MED ORDER — SODIUM CHLORIDE 0.9 % IV SOLN
1.0000 g | Freq: Once | INTRAVENOUS | Status: AC
  Administered 2017-10-17: 1 g via INTRAVENOUS
  Filled 2017-10-17: qty 10

## 2017-10-17 NOTE — ED Notes (Signed)
Patient given discharge teaching and verbalized understanding. Patient ambulated out of ED with a steady gait. 

## 2017-10-17 NOTE — ED Notes (Signed)
Patient transported to X-ray 

## 2017-10-17 NOTE — Telephone Encounter (Signed)
Pt reports chest tightness, 7-8/10.  Onset Saturday, intermittent at that time, radiated into jaw.  States tightness is at "Entire upper chest area." Reports has had a dry cough for several weeks; seen by Dr. Regis Bill 09/20/17 for sinus infection.  Also reports "Extremely weak", flushed, diaphoretic at times, mild SOB. Denies nausea, dizziness. "I just don't feel right." Pt directed to ED; states husband will drive. Reason for Disposition . Pain also present in shoulder(s) or arm(s) or jaw  (Exception: pain is clearly made worse by movement)  Answer Assessment - Initial Assessment Questions 1. LOCATION: "Where does it hurt?"      "Entire upper chest",  "Tightness" 2. RADIATION: "Does the pain go anywhere else?" (e.g., into neck, jaw, arms, back)     Jaw 3. ONSET: "When did the chest pain begin?" (Minutes, hours or days)      2 days ago, off and on, now constant 4. PATTERN "Does the pain come and go, or has it been constant since it started?"  "Does it get worse with exertion?"      Constant at this time, has been resting 5. DURATION: "How long does it last" (e.g., seconds, minutes, hours)     Constant at this time 6. SEVERITY: "How bad is the pain?"  (e.g., Scale 1-10; mild, moderate, or severe)    - MILD (1-3): doesn't interfere with normal activities     - MODERATE (4-7): interferes with normal activities or awakens from sleep    - SEVERE (8-10): excruciating pain, unable to do any normal activities       7-8/10 7. CARDIAC RISK FACTORS: "Do you have any history of heart problems or risk factors for heart disease?" (e.g., prior heart attack, angina; high blood pressure, diabetes, being overweight, high cholesterol, smoking, or strong family history of heart disease)     HTN 8. PULMONARY RISK FACTORS: "Do you have any history of lung disease?"  (e.g., blood clots in lung, asthma, emphysema, birth control pills)     no 9. Cause: "What do you think is causing the chest pain?"    "Maybe  indigestion." 10. OTHER SYMPTOMS: "Do you have any other symptoms?" (e.g., dizziness, nausea, vomiting, sweating, fever, difficulty breathing, cough)      Flushed, diaphoresis at times, "Extreme weakness."  Mild SOB  Protocols used: CHEST PAIN-A-AH

## 2017-10-17 NOTE — Telephone Encounter (Signed)
Pt went to ED.  No further action required from PCP at this time.

## 2017-10-17 NOTE — ED Provider Notes (Signed)
[facility] DEPT Provider Note   CSN: [id number] Arrival date & time: 10/17/17  0915     History   Chief Complaint Chief Complaint  Patient presents with  . Shortness of Breath  . Chest Pain  . Weakness    HPI Jillian Escobar is a 67 y.o. female.  HPI Patient presents with gradually worsening shortness of breath especially with exertion, chest tightness and cough with clear sputum production.  She has had intermittent chills but denies any known fevers.  No new lower extremity swelling or pain.  Symptoms are worse with minimal exertion.  Patient admits to episodic wheezing. Past Medical History:  Diagnosis Date  . Allergy   . Arthritis   . GERD (gastroesophageal reflux disease)   . Hx of varicella   . Hyperlipidemia    borderline  . Hypothyroidism   . Pneumonia 11/2014   Only time took inhalers to treat  . Shingles 10/26/2010  . Syncope and collapse    Unclear etiology   . Thyroid disease     Patient Active Problem List   Diagnosis Date Noted  . Gastric polyps   . Fam hx-ischem heart disease 01/15/2015  . Elevated blood pressure reading without diagnosis of hypertension 01/11/2015  . Syncope and collapse   . Syncope 01/09/2015  . Chest pain with low risk for cardiac etiology 01/09/2015  . Family history of premature CAD 01/09/2015  . Thyroid activity decreased 03/04/2014  . Fasting hyperglycemia 03/04/2014  . Exertional shortness of breath 12/04/2013  . Hepatic steatosis mild 11/23/2013  . Elevated blood pressure reading 11/16/2013  . Uncomplicated bereavement 19/37/9024  . BPPV (benign paroxysmal positional vertigo) 11/10/2012  . Eustachian tube dysfunction 11/10/2012  . Family history of Alzheimer's disease 08/10/2011  . Perimenopausal vasomotor symptoms 08/10/2011  . Otalgia of left ear 07/30/2011  . Post-nasal drainage 07/30/2011  . Cough, persistent recurrent 07/30/2011  . Postmenopausal HRT (hormone replacement  therapy) 06/28/2010  . Preventative health care 06/28/2010  . ULCERATION OF VULVA DISEASE CLASSIFIED ELSEWHERE 02/26/2009  . UNSPECIFIED HYPOTHYROIDISM 01/22/2008  . GERD 01/22/2008  . PLANTAR FASCIITIS, RIGHT 01/02/2008  . HEEL PAIN, RIGHT 01/02/2008    Past Surgical History:  Procedure Laterality Date  . CESAREAN SECTION     x1  . CHOLECYSTECTOMY    . COLONOSCOPY    . ESOPHAGOGASTRODUODENOSCOPY (EGD) WITH PROPOFOL N/A 12/09/2015   Procedure: ESOPHAGOGASTRODUODENOSCOPY (EGD) WITH PROPOFOL;  Surgeon: Manus Gunning, MD;  Location: [facility];  Service: Gastroenterology;  Laterality: N/A;  . EXPLORATORY LAPAROTOMY     Gallbladder  . POLYPECTOMY N/A 12/09/2015   Procedure: POLYPECTOMY;  Surgeon: Manus Gunning, MD;  Location: [facility];  Service: Gastroenterology;  Laterality: N/A;  . TUBAL LIGATION    . WISDOM TOOTH EXTRACTION       OB History   None      Home Medications    Prior to Admission medications   Medication Sig Start Date End Date Taking? Authorizing Provider  Cholecalciferol (VITAMIN D PO) Take 2 tablets by mouth daily.   Yes [provider]  ibuprofen (ADVIL,MOTRIN) 800 MG tablet Take 800 mg by mouth 2 (two) times daily as needed for headache or moderate pain.   Yes [provider]  levothyroxine (SYNTHROID, LEVOTHROID) 50 MCG tablet TAKE 1 TABLET BY MOUTH EVERY DAY 08/16/17  Yes Panosh, Standley Brooking, MD  Multiple Vitamin (MULTIVITAMIN WITH MINERALS) TABS tablet Take 1 tablet by mouth daily.   Yes [provider]  OVER  THE COUNTER MEDICATION Place 1 Dose under the tongue 2 (two) times daily. CBD oil   Yes [provider]  OVER THE COUNTER MEDICATION    Yes [provider]  ranitidine (ZANTAC) 150 MG tablet Take 150 mg by mouth 2 (two) times daily as needed for heartburn.   Yes [provider]  TURMERIC PO Take 1 tablet by mouth daily.   Yes [provider]  amoxicillin (AMOXIL) 500 MG  capsule Take 1 capsule (500 mg total) by mouth 3 (three) times daily. If needed for sinusitis Patient not taking: Reported on 10/17/2017 09/12/17   Panosh, Standley Brooking, MD  azithromycin (ZITHROMAX) 250 MG tablet Take 1 tablet (250 mg total) by mouth daily. 10/18/17   Julianne Rice, MD  doxycycline (VIBRA-TABS) 100 MG tablet Take 1 tablet (100 mg total) by mouth 2 (two) times daily. Patient not taking: Reported on 10/17/2017 09/23/17   Panosh, Standley Brooking, MD  hydrOXYzine (ATARAX/VISTARIL) 25 MG tablet Take 1 tablet (25 mg total) by mouth 3 (three) times daily as needed for itching. Patient not taking: Reported on 10/17/2017 08/20/16   Billie Ruddy, MD  ketoconazole (NIZORAL) 2 % cream Apply 1 application topically 2 (two) times daily. Patient not taking: Reported on 10/17/2017 08/30/16   Panosh, Standley Brooking, MD  levofloxacin (LEVAQUIN) 750 MG tablet Take 1 tablet (750 mg total) by mouth daily. Patient not taking: Reported on 10/17/2017 09/20/17   Panosh, Standley Brooking, MD  LORazepam (ATIVAN) 0.5 MG tablet Take 1-2 tablets (0.5-1 mg total) by mouth 2 (two) times daily as needed for anxiety. Patient not taking: Reported on 10/17/2017 08/30/16   Panosh, Standley Brooking, MD  nystatin cream (MYCOSTATIN) Apply 1 application topically 2 (two) times daily. Apply to chest Patient not taking: Reported on 10/17/2017 08/20/16   Billie Ruddy, MD  predniSONE (DELTASONE) 20 MG tablet 3 tabs po day one, then 2 po daily x 4 days 10/18/17   Julianne Rice, MD    Family History Family History  Problem Relation Age of Onset  . Heart disease Mother        stent in 73 s  . COPD Father   . Heart disease Father        cabg in 23s   . Glaucoma Father   . Dementia Father   . Ulcerative colitis Sister   . Ulcerative colitis Brother   . Arrhythmia Daughter   . Colon cancer Cousin   . Colon polyps Neg Hx   . Esophageal cancer Neg Hx   . Rectal cancer Neg Hx   . Stomach cancer Neg Hx     Social History Social History    Tobacco Use  . Smoking status: Former Smoker    Last attempt to quit: 01/04/1982    Years since quitting: 35.8  . Smokeless tobacco: Never Used  Substance Use Topics  . Alcohol use: Yes    Alcohol/week: 0.0 standard drinks    Comment: daily- 2 a night   . Drug use: No     Allergies   Eggs or egg-derived products and Levaquin [levofloxacin]   Review of Systems Review of Systems  Constitutional: Positive for chills. Negative for fever.  HENT: Negative for sinus pain and sore throat.   Eyes: Negative for visual disturbance.  Respiratory: Positive for cough, chest tightness, shortness of breath and wheezing.   Cardiovascular: Negative for chest pain, palpitations and leg swelling.  Gastrointestinal: Negative for abdominal pain, constipation, diarrhea, nausea and vomiting.  Genitourinary:  Negative for dysuria, flank pain and frequency.  Musculoskeletal: Negative for back pain, myalgias and neck pain.  Skin: Negative for rash and wound.  Neurological: Negative for dizziness, weakness, light-headedness, numbness and headaches.  All other systems reviewed and are negative.    Physical Exam Updated Vital Signs BP (!) 153/81   Pulse 79   Temp 98.2 F (36.8 C) (Oral)   Resp 20   Ht 5\' 3"  (1.6 m)   Wt 80.3 kg   SpO2 98%   BMI 31.35 kg/m   Physical Exam  Constitutional: She is oriented to person, place, and time. She appears well-developed and well-nourished. No distress.  HENT:  Head: Normocephalic and atraumatic.  Mouth/Throat: Oropharynx is clear and moist. No oropharyngeal exudate.  Eyes: Pupils are equal, round, and reactive to light. EOM are normal.  Neck: Normal range of motion. Neck supple. No JVD present.  Cardiovascular: Normal rate and regular rhythm. Exam reveals no gallop and no friction rub.  No murmur heard. Pulmonary/Chest: Effort normal. No respiratory distress. She has wheezes. She has no rales. She exhibits no tenderness.  Patient with few expiratory  wheezes especially in the right lung field.  Diminished breath sounds bilateral bases.  No respiratory distress.  Abdominal: Soft. Bowel sounds are normal. She exhibits no mass. There is no tenderness. There is no guarding. No hernia.  Musculoskeletal: Normal range of motion. She exhibits no edema or tenderness.  No lower extremity swelling, asymmetry or tenderness.  Distal pulses intact.  No midline thoracic or lumbar tenderness.  Lymphadenopathy:    She has no cervical adenopathy.  Neurological: She is alert and oriented to person, place, and time.  Moving all extremities without deficit.  Sensation fully intact.  Skin: Skin is warm and dry. No rash noted. She is not diaphoretic. No erythema.  Psychiatric: She has a normal mood and affect. Her behavior is normal.  Nursing note and vitals reviewed.    ED Treatments / Results  Labs (all labs ordered are listed, but only abnormal results are displayed) Labs Reviewed  BASIC METABOLIC PANEL - Abnormal; Notable for the following components:      Result Value   Glucose, Bld 111 (*)    All other components within normal limits  D-DIMER, QUANTITATIVE (NOT AT [facility]) - Abnormal; Notable for the following components:   D-Dimer, Escobar 0.71 (*)    All other components within normal limits  CBC  BRAIN NATRIURETIC PEPTIDE  I-STAT TROPONIN, ED    EKG EKG Interpretation  Date/Time:  Monday October 17 2017 09:24:47 EDT Ventricular Rate:  78 PR Interval:    QRS Duration: 127 QT Interval:  422 QTC Calculation: 481 R Axis:   -69 Text Interpretation:  Sinus rhythm RBBB and LAFB LVH by voltage ST elevation, consider inferior injury Baseline wander in lead(s) I II aVR V1 Confirmed by Julianne Rice [phone]) on 10/17/2017 10:01:59 AM   Radiology Dg Chest 2 View  Result Date: 10/17/2017 CLINICAL DATA:  Nonproductive cough and intermittent chest pain for the past week associated with shortness of breath and weakness beginning today. EXAM: CHEST  - 2 VIEW COMPARISON:  CT scan of the chest of January 05, 2015 and PA and lateral chest x-ray of February 26, 2016. FINDINGS: The lungs are adequately inflated. There is hazy increased density in the right lower lung just lateral to a cardiac monitoring electrodes clip. The left lung is clear. The heart and pulmonary vascularity are normal. The mediastinum is normal in width. There is no  pleural effusion. The bony thorax is unremarkable. IMPRESSION: Subsegmental atelectasis or developing pneumonia in the right infrahilar region. No CHF. Followup PA and lateral chest X-ray is recommended in 3-4 weeks following trial of antibiotic therapy to ensure resolution and exclude underlying malignancy. Electronically Signed   By: Lamoyne Palencia  [place] M.D.   On: 10/17/2017 10:16   Ct Angio Chest Pe W And/or Wo Contrast  Result Date: 10/17/2017 CLINICAL DATA:  Pneumonia, elevated D-dimer, chest tightness rated at 7-8/10, dry cough for several weeks, weakness, flushed, diaphoretic, mild shortness of breath EXAM: CT ANGIOGRAPHY CHEST WITH CONTRAST TECHNIQUE: Multidetector CT imaging of the chest was performed using the standard protocol during bolus administration of intravenous contrast. Multiplanar CT image reconstructions and MIPs were obtained to evaluate the vascular anatomy. CONTRAST:  56mL ISOVUE-370 IOPAMIDOL (ISOVUE-370) INJECTION 76% IV COMPARISON:  01/05/2015 FINDINGS: Cardiovascular: Tortuous innominate artery. Aorta normal caliber. No aortic aneurysm or dissection. Pulmonary arteries adequately opacified and patent. No evidence of pulmonary embolism. No pericardial effusion. Mediastinum/Nodes: Esophagus normal appearance. No thoracic adenopathy. Base of cervical region normal appearance. Lungs/Pleura: Subsegmental atelectasis in medial basal segment of the RIGHT lower lobe. Minimal dependent atelectasis in the posterior lower lobes bilaterally. Minimal chronic accentuation of medial LEFT upper lobe markings anteriorly  near apex. Lungs otherwise clear. No pulmonary infiltrate, pleural effusion, or pneumothorax. Upper Abdomen: Visualized upper abdomen unremarkable Musculoskeletal: Unremarkable Review of the MIP images confirms the above findings. IMPRESSION: No evidence of pulmonary embolism. Scattered atelectasis in the lower lobes as above. Electronically Signed   By: Lavonia Dana M.D.   On: 10/17/2017 12:35    Procedures Procedures (including critical care time)  Medications Ordered in ED Medications  sodium chloride 0.9 % injection (has no administration in time range)  iopamidol (ISOVUE-370) 76 % injection (has no administration in time range)  methylPREDNISolone sodium succinate (SOLU-MEDROL) 125 mg/2 mL injection 125 mg (125 mg Intravenous Given 10/17/17 1033)  albuterol (PROVENTIL) (2.5 MG/3ML) 0.083% nebulizer solution 5 mg (5 mg Nebulization Given 10/17/17 1033)  cefTRIAXone (ROCEPHIN) 1 g in sodium chloride 0.9 % 100 mL IVPB (0 g Intravenous Stopped 10/17/17 1151)  azithromycin (ZITHROMAX) 500 mg in sodium chloride 0.9 % 250 mL IVPB (500 mg Intravenous New Bag/Given 10/17/17 1150)  iopamidol (ISOVUE-370) 76 % injection 100 mL (76 mLs Intravenous Contrast Given 10/17/17 1215)  albuterol (PROVENTIL) (2.5 MG/3ML) 0.083% nebulizer solution 5 mg (5 mg Nebulization Given 10/17/17 1335)     Initial Impression / Assessment and Plan / ED Course  I have reviewed the triage vital signs and the nursing notes.  Pertinent labs & imaging results that were available during my care of the patient were reviewed by me and considered in my medical decision making (see chart for details).     Chest tightness significantly improved after albuterol nebulizer.  Troponin is normal.  EKG without ischemic findings.  Question early pneumonia on chest x-ray.  Given dose of IV Rocephin and azithromycin in the emergency department as well as IV steroids.  Patient's d-dimer did come back elevated.  CT angios chest without  evidence of PE.  Will give short course of steroids and patient has been given albuterol inhaler.  Strict return precautions given.  Final Clinical Impressions(s) / ED Diagnoses   Final diagnoses:  Bronchospasm  Chest tightness  Community acquired pneumonia of right lung, unspecified part of lung    ED Discharge Orders         Ordered    predniSONE (DELTASONE) 20 MG tablet  10/17/17 1332    azithromycin (ZITHROMAX) 250 MG tablet  Daily     10/17/17 1332           Julianne Rice, MD 10/17/17 1347

## 2017-10-17 NOTE — ED Triage Notes (Addendum)
Patient c/o non productive cough and intermittent chest pain x 1 week. Patient states she began having weakness and SOB with extertion this AM.

## 2017-10-31 ENCOUNTER — Ambulatory Visit (INDEPENDENT_AMBULATORY_CARE_PROVIDER_SITE_OTHER): Payer: BC Managed Care – PPO | Admitting: Internal Medicine

## 2017-10-31 ENCOUNTER — Telehealth: Payer: Self-pay | Admitting: Internal Medicine

## 2017-10-31 ENCOUNTER — Encounter: Payer: Self-pay | Admitting: Internal Medicine

## 2017-10-31 VITALS — BP 142/82 | HR 81 | Temp 98.0°F | Wt 174.3 lb

## 2017-10-31 DIAGNOSIS — Z23 Encounter for immunization: Secondary | ICD-10-CM

## 2017-10-31 DIAGNOSIS — Z79899 Other long term (current) drug therapy: Secondary | ICD-10-CM | POA: Diagnosis not present

## 2017-10-31 DIAGNOSIS — R232 Flushing: Secondary | ICD-10-CM | POA: Diagnosis not present

## 2017-10-31 DIAGNOSIS — R011 Cardiac murmur, unspecified: Secondary | ICD-10-CM

## 2017-10-31 DIAGNOSIS — I10 Essential (primary) hypertension: Secondary | ICD-10-CM | POA: Diagnosis not present

## 2017-10-31 MED ORDER — LOSARTAN POTASSIUM 50 MG PO TABS: 50.0000 mg | ORAL_TABLET | Freq: Every day | ORAL | 1 refills | Status: DC

## 2017-10-31 NOTE — Patient Instructions (Addendum)
Add  bp medication   And Continue lifestyle intervention healthy eating and exercise .   For BP control  I hear a murmur  Today.  Will be contacted about getting and echocardiogram .   ROV  in 3 mos or as needed .      DASH Eating Plan DASH stands for "Dietary Approaches to Stop Hypertension." The DASH eating plan is a healthy eating plan that has been shown to reduce high blood pressure (hypertension). It may also reduce your risk for type 2 diabetes, heart disease, and stroke. The DASH eating plan may also help with weight loss. What are tips for following this plan? General guidelines  Avoid eating more than 2,300 mg (milligrams) of salt (sodium) a day. If you have hypertension, you may need to reduce your sodium intake to 1,500 mg a day.  Limit alcohol intake to no more than 1 drink a day for nonpregnant women and 2 drinks a day for men. One drink equals 12 oz of beer, 5 oz of wine, or 1 oz of hard liquor.  Work with your health care provider to maintain a healthy body weight or to lose weight. Ask what an ideal weight is for you.  Get at least 30 minutes of exercise that causes your heart to beat faster (aerobic exercise) most days of the week. Activities may include walking, swimming, or biking.  Work with your health care provider or diet and nutrition specialist (dietitian) to adjust your eating plan to your individual calorie needs. Reading food labels  Check food labels for the amount of sodium per serving. Choose foods with less than 5 percent of the Daily Value of sodium. Generally, foods with less than 300 mg of sodium per serving fit into this eating plan.  To find whole grains, look for the word "whole" as the first word in the ingredient list. Shopping  Buy products labeled as "low-sodium" or "no salt added."  Buy fresh foods. Avoid canned foods and premade or frozen meals. Cooking  Avoid adding salt when cooking. Use salt-free seasonings or herbs instead of  table salt or sea salt. Check with your health care provider or pharmacist before using salt substitutes.  Do not fry foods. Cook foods using healthy methods such as baking, boiling, grilling, and broiling instead.  Cook with heart-healthy oils, such as olive, canola, soybean, or sunflower oil. Meal planning   Eat a balanced diet that includes: ? 5 or more servings of fruits and vegetables each day. At each meal, try to fill half of your plate with fruits and vegetables. ? Up to 6-8 servings of whole grains each day. ? Less than 6 oz of lean meat, poultry, or fish each day. A 3-oz serving of meat is about the same size as a deck of cards. One egg equals 1 oz. ? 2 servings of low-fat dairy each day. ? A serving of nuts, seeds, or beans 5 times each week. ? Heart-healthy fats. Healthy fats called Omega-3 fatty acids are found in foods such as flaxseeds and coldwater fish, like sardines, salmon, and mackerel.  Limit how much you eat of the following: ? Canned or prepackaged foods. ? Food that is high in trans fat, such as fried foods. ? Food that is high in saturated fat, such as fatty meat. ? Sweets, desserts, sugary drinks, and other foods with added sugar. ? Full-fat dairy products.  Do not salt foods before eating.  Try to eat at least 2 vegetarian meals each  week.  Eat more home-cooked food and less restaurant, buffet, and fast food.  When eating at a restaurant, ask that your food be prepared with less salt or no salt, if possible. What foods are recommended? The items listed may not be a complete list. Talk with your dietitian about what dietary choices are best for you. Grains Whole-grain or whole-wheat bread. Whole-grain or whole-wheat pasta. Brown rice. [place]row. Bulgur. Whole-grain and low-sodium cereals. Pita bread. Low-fat, low-sodium crackers. Whole-wheat flour tortillas. Vegetables Fresh or frozen vegetables (raw, steamed, roasted, or grilled). Low-sodium or  reduced-sodium tomato and vegetable juice. Low-sodium or reduced-sodium tomato sauce and tomato paste. Low-sodium or reduced-sodium canned vegetables. Fruits All fresh, dried, or frozen fruit. Canned fruit in natural juice (without added sugar). Meat and other protein foods Skinless chicken or [place]. Ground chicken or [place]. Pork with fat trimmed off. Fish and seafood. Egg whites. Dried beans, peas, or lentils. Unsalted nuts, nut butters, and seeds. Unsalted canned beans. Lean cuts of beef with fat trimmed off. Low-sodium, lean deli meat. Dairy Low-fat (1%) or fat-free (skim) milk. Fat-free, low-fat, or reduced-fat cheeses. Nonfat, low-sodium ricotta or cottage cheese. Low-fat or nonfat yogurt. Low-fat, low-sodium cheese. Fats and oils Soft margarine without trans fats. Vegetable oil. Low-fat, reduced-fat, or light mayonnaise and salad dressings (reduced-sodium). Canola, safflower, olive, soybean, and sunflower oils. Avocado. Seasoning and other foods Herbs. Spices. Seasoning mixes without salt. Unsalted popcorn and pretzels. Fat-free sweets. What foods are not recommended? The items listed may not be a complete list. Talk with your dietitian about what dietary choices are best for you. Grains Baked goods made with fat, such as croissants, muffins, or some breads. Dry pasta or rice meal packs. Vegetables Creamed or fried vegetables. Vegetables in a cheese sauce. Regular canned vegetables (not low-sodium or reduced-sodium). Regular canned tomato sauce and paste (not low-sodium or reduced-sodium). Regular tomato and vegetable juice (not low-sodium or reduced-sodium). Angie Fava. Olives. Fruits Canned fruit in a light or heavy syrup. Fried fruit. Fruit in cream or butter sauce. Meat and other protein foods Fatty cuts of meat. Ribs. Fried meat. Berniece Salines. Sausage. Bologna and other processed lunch meats. Salami. Fatback. Hotdogs. Bratwurst. Salted nuts and seeds. Canned beans with added salt. Canned or  smoked fish. Whole eggs or egg yolks. Chicken or [place] with skin. Dairy Whole or 2% milk, cream, and half-and-half. Whole or full-fat cream cheese. Whole-fat or sweetened yogurt. Full-fat cheese. Nondairy creamers. Whipped toppings. Processed cheese and cheese spreads. Fats and oils Butter. Stick margarine. Lard. Shortening. Ghee. Bacon fat. Tropical oils, such as coconut, palm kernel, or palm oil. Seasoning and other foods Salted popcorn and pretzels. Onion salt, garlic salt, seasoned salt, table salt, and sea salt. Worcestershire sauce. Tartar sauce. Barbecue sauce. Teriyaki sauce. Soy sauce, including reduced-sodium. Steak sauce. Canned and packaged gravies. Fish sauce. Oyster sauce. Cocktail sauce. Horseradish that you find on the shelf. Ketchup. Mustard. Meat flavorings and tenderizers. Bouillon cubes. Hot sauce and Tabasco sauce. Premade or packaged marinades. Premade or packaged taco seasonings. Relishes. Regular salad dressings. Where to find more information:  National Heart, Lung, and [place]: https://wilson-eaton.com/  American Heart Association: www.heart.org Summary  The DASH eating plan is a healthy eating plan that has been shown to reduce high blood pressure (hypertension). It may also reduce your risk for type 2 diabetes, heart disease, and stroke.  With the DASH eating plan, you should limit salt (sodium) intake to 2,300 mg a day. If you have hypertension, you may need to reduce your  sodium intake to 1,500 mg a day.  When on the DASH eating plan, aim to eat more fresh fruits and vegetables, whole grains, lean proteins, low-fat dairy, and heart-healthy fats.  Work with your health care provider or diet and nutrition specialist (dietitian) to adjust your eating plan to your individual calorie needs. This information is not intended to replace advice given to you by your health care provider. Make sure you discuss any questions you have with your health care provider. Document  Released: 12/10/2010 Document Revised: 12/15/2015 Document Reviewed: 12/15/2015 Elsevier Interactive Patient Education  [employer].

## 2017-10-31 NOTE — Telephone Encounter (Signed)
Copied from [place] [id number]. Topic: Quick Communication - Rx Refill/Question >> Oct 31, 2017  5:06 PM Cecelia Byars, NT wrote: Medication losartan (COZAAR) 50 MG tablet , the patient said the pharmacy does not have the losartan in 50 mg tablet due to it being on back order .They only have the losartan 25 mg tablet and the prescription needs to be rewritten for that .  Has the patient contacted their pharmacy? Yes  (Agent: If no, request that the patient contact the pharmacy for the refill. (Agent: If yes, when and what did the pharmacy advise? Above information  Preferred Pharmacy (with phone number or street name [id number] - [place], [place] [phone] (Phone) [phone] (Fax)    Agent: Please be advised that RX refills may take up to 3 business days. We ask that you follow-up with your pharmacy.

## 2017-10-31 NOTE — Progress Notes (Signed)
Chief Complaint  Patient presents with  . Follow-up    BP check.    HPI: Jillian Escobar 67 y.o. come in for  Fu bp and control   Bit was seen in ed 10 14 for bronchospasm after rx for prolonges resp sx and rx for sinsusits  Much better  But still has annoying cough and mild sob   Has readings  Here  Bu in 932-355 range diastolic  80 - 90   ekg showed rbbb abd lafb and lvh by voltage   elevaetd d dimer  Chest tightness significantly improved after albuterol nebulizer.  Troponin is normal.  EKG without ischemic findings.  Question early pneumonia on chest x-ray.  Given dose of IV Rocephin and azithromycin in the emergency department as well as IV steroids.  Patient's d-dimer did come back elevated.  CT angios chest without evidence of PE.  Will give short course of steroids and patient has been given albuterol inhaler.  Strict return precautions given.  Chest ct was neg for acute problems   fam hx of lipids  Mom and bro  And bro BP .  2 bev per night   ROS: See pertinent positives and negatives per HPI. No cp fever  edema  Past Medical History:  Diagnosis Date  . Allergy   . Arthritis   . GERD (gastroesophageal reflux disease)   . Hx of varicella   . Hyperlipidemia    borderline  . Hypothyroidism   . Pneumonia 11/2014   Only time took inhalers to treat  . Shingles 10/26/2010  . Syncope and collapse    Unclear etiology   . Thyroid disease     Family History  Problem Relation Age of Onset  . Heart disease Mother        stent in 62 s  . COPD Father   . Heart disease Father        cabg in 48s   . Glaucoma Father   . Dementia Father   . Ulcerative colitis Sister   . Ulcerative colitis Brother   . Arrhythmia Daughter   . Colon cancer Cousin   . Colon polyps Neg Hx   . Esophageal cancer Neg Hx   . Rectal cancer Neg Hx   . Stomach cancer Neg Hx     Social History   Socioeconomic History  . Marital status: Married    Spouse name: Not on file  . Number of  children: 1  . Years of education: Not on file  . Highest education level: Not on file  Occupational History  . Not on file  Social Needs  . Financial resource strain: Not on file  . Food insecurity:    Worry: Not on file    Inability: Not on file  . Transportation needs:    Medical: Not on file    Non-medical: Not on file  Tobacco Use  . Smoking status: Former Smoker    Last attempt to quit: 01/04/1982    Years since quitting: 35.8  . Smokeless tobacco: Never Used  Substance and Sexual Activity  . Alcohol use: Yes    Alcohol/week: 0.0 standard drinks    Comment: daily- 2 a night   . Drug use: No  . Sexual activity: Not on file  Lifestyle  . Physical activity:    Days per week: Not on file    Minutes per session: Not on file  . Stress: Not on file  Relationships  . Social connections:  Talks on phone: Not on file    Gets together: Not on file    Attends religious service: Not on file    Active member of club or organization: Not on file    Attends meetings of clubs or organizations: Not on file    Relationship status: Not on file  Other Topics Concern  . Not on file  Social History Narrative   H H of 2    Married, G2P1   Former tobacco;drinks beer wine   Walks maybe 30 minutes or more days a week. Doesn't do more than that because she is "lazy"   Pet 2 dog and 2 cats .   [employer]   Originally from [place], moved over 20 years ago    Outpatient Medications Prior to Visit  Medication Sig Dispense Refill  . amoxicillin (AMOXIL) 500 MG capsule Take 1 capsule (500 mg total) by mouth 3 (three) times daily. If needed for sinusitis 21 capsule 0  . azithromycin (ZITHROMAX) 250 MG tablet Take 1 tablet (250 mg total) by mouth daily. 4 tablet 0  . Cholecalciferol (VITAMIN D PO) Take 2 tablets by mouth daily.    [name] doxycycline (VIBRA-TABS) 100 MG tablet Take 1 tablet (100 mg total) by mouth 2 (two) times daily. 10 tablet 0  . hydrOXYzine  (ATARAX/VISTARIL) 25 MG tablet Take 1 tablet (25 mg total) by mouth 3 (three) times daily as needed for itching. 60 tablet 0  . ibuprofen (ADVIL,MOTRIN) 800 MG tablet Take 800 mg by mouth 2 (two) times daily as needed for headache or moderate pain.    [name] ketoconazole (NIZORAL) 2 % cream Apply 1 application topically 2 (two) times daily. 30 g 0  . levofloxacin (LEVAQUIN) 750 MG tablet Take 1 tablet (750 mg total) by mouth daily. 5 tablet 0  . levothyroxine (SYNTHROID, LEVOTHROID) 50 MCG tablet TAKE 1 TABLET BY MOUTH EVERY DAY 90 tablet 1  . LORazepam (ATIVAN) 0.5 MG tablet Take 1-2 tablets (0.5-1 mg total) by mouth 2 (two) times daily as needed for anxiety. 20 tablet 0  . Multiple Vitamin (MULTIVITAMIN WITH MINERALS) TABS tablet Take 1 tablet by mouth daily.    [name] nystatin cream (MYCOSTATIN) Apply 1 application topically 2 (two) times daily. Apply to chest 30 g 0  . OVER THE COUNTER MEDICATION Place 1 Dose under the tongue 2 (two) times daily. CBD oil    . OVER THE COUNTER MEDICATION     . predniSONE (DELTASONE) 20 MG tablet 3 tabs po day one, then 2 po daily x 4 days 11 tablet 0  . ranitidine (ZANTAC) 150 MG tablet Take 150 mg by mouth 2 (two) times daily as needed for heartburn.    . TURMERIC PO Take 1 tablet by mouth daily.     No facility-administered medications prior to visit.      EXAM:  BP (!) 142/82 (BP Location: Right Arm, Patient Position: Sitting, Cuff Size: Normal)   Pulse 81   Temp 98 F (36.7 C) (Oral)   Wt 174 lb 4.8 oz (79.1 kg)   BMI 30.88 kg/m   Body mass index is 30.88 kg/m.  GENERAL: vitals reviewed and listed above, alert, oriented, appears well hydrated and in no acute distress HEENT: atraumatic, conjunctiva  clear, no obvious abnormalities on inspection of external nose and ears NECK: no obvious masses on inspection palpation  LUNGS: clear to auscultation bilaterally, no wheezes, rales or rhonchi, good air movement CV: HRRR,  2/6 sem usb  Not radiating to neck   no clubbing cyanosis or  peripheral edema nl cap refill  MS: moves all extremities without noticeable focal  abnormality PSYCH: pleasant and cooperative, no obvious depression or anxiety Lab Results  Component Value Date   WBC 5.6 10/17/2017   HGB 14.9 10/17/2017   HCT 43.4 10/17/2017   PLT 271 10/17/2017   GLUCOSE 111 (H) 10/17/2017   CHOL 199 05/18/2016   TRIG 75.0 05/18/2016   HDL 52.00 05/18/2016   LDLDIRECT 158.4 08/14/2012   LDLCALC 132 (H) 05/18/2016   ALT 17 05/18/2016   AST 16 05/18/2016   NA 143 10/17/2017   K 3.8 10/17/2017   CL 109 10/17/2017   CREATININE 0.67 10/17/2017   BUN 20 10/17/2017   CO2 25 10/17/2017   TSH 3.35 05/18/2016   BP Readings from Last 3 Encounters:  10/31/17 (!) 142/82  10/17/17 (!) 153/81  09/20/17 (!) 142/80   bp log reviewed  ASSESSMENT AND PLAN:  Discussed the following assessment and plan:  Essential hypertension - Plan: ECHOCARDIOGRAM COMPLETE  Medication management  Heart murmur - Plan: ECHOCARDIOGRAM COMPLETE  Need for influenza vaccination - Plan: Flu vaccine HIGH DOSE PF (Fluzone High dose)  Hot flashes - better in cool climate  will folow later  Advise adding antihypertensive  Since has had persistent elevation of  bp  -Patient advised to return or notify health care team  if  new concerns arise. Had sniffles congestion with  Eggs when young but no hives or anaphylaxis   .   Should be  safe getting flu vaccine today .   Patient Instructions  Add  bp medication   And Continue lifestyle intervention healthy eating and exercise .   For BP control  I hear a murmur  Today.  Will be contacted about getting and echocardiogram .   ROV  in 3 mos or as needed .      DASH Eating Plan DASH stands for "Dietary Approaches to Stop Hypertension." The DASH eating plan is a healthy eating plan that has been shown to reduce high blood pressure (hypertension). It may also reduce your risk for type 2 diabetes, heart disease, and  stroke. The DASH eating plan may also help with weight loss. What are tips for following this plan? General guidelines  Avoid eating more than 2,300 mg (milligrams) of salt (sodium) a day. If you have hypertension, you may need to reduce your sodium intake to 1,500 mg a day.  Limit alcohol intake to no more than 1 drink a day for nonpregnant women and 2 drinks a day for men. One drink equals 12 oz of beer, 5 oz of wine, or 1 oz of hard liquor.  Work with your health care provider to maintain a healthy body weight or to lose weight. Ask what an ideal weight is for you.  Get at least 30 minutes of exercise that causes your heart to beat faster (aerobic exercise) most days of the week. Activities may include walking, swimming, or biking.  Work with your health care provider or diet and nutrition specialist (dietitian) to adjust your eating plan to your individual calorie needs. Reading food labels  Check food labels for the amount of sodium per serving. Choose foods with less than 5 percent of the Daily Value of sodium. Generally, foods with less than 300 mg of sodium per serving fit into this eating plan.  To find whole grains, look for the word "whole" as the  first word in the ingredient list. Shopping  Buy products labeled as "low-sodium" or "no salt added."  Buy fresh foods. Avoid canned foods and premade or frozen meals. Cooking  Avoid adding salt when cooking. Use salt-free seasonings or herbs instead of table salt or sea salt. Check with your health care provider or pharmacist before using salt substitutes.  Do not fry foods. Cook foods using healthy methods such as baking, boiling, grilling, and broiling instead.  Cook with heart-healthy oils, such as olive, canola, soybean, or sunflower oil. Meal planning   Eat a balanced diet that includes: ? 5 or more servings of fruits and vegetables each day. At each meal, try to fill half of your plate with fruits and vegetables. ? Up  to 6-8 servings of whole grains each day. ? Less than 6 oz of lean meat, poultry, or fish each day. A 3-oz serving of meat is about the same size as a deck of cards. One egg equals 1 oz. ? 2 servings of low-fat dairy each day. ? A serving of nuts, seeds, or beans 5 times each week. ? Heart-healthy fats. Healthy fats called Omega-3 fatty acids are found in foods such as flaxseeds and coldwater fish, like sardines, salmon, and mackerel.  Limit how much you eat of the following: ? Canned or prepackaged foods. ? Food that is high in trans fat, such as fried foods. ? Food that is high in saturated fat, such as fatty meat. ? Sweets, desserts, sugary drinks, and other foods with added sugar. ? Full-fat dairy products.  Do not salt foods before eating.  Try to eat at least 2 vegetarian meals each week.  Eat more home-cooked food and less restaurant, buffet, and fast food.  When eating at a restaurant, ask that your food be prepared with less salt or no salt, if possible. What foods are recommended? The items listed may not be a complete list. Talk with your dietitian about what dietary choices are best for you. Grains Whole-grain or whole-wheat bread. Whole-grain or whole-wheat pasta. Brown rice. [place]. Bulgur. Whole-grain and low-sodium cereals. Pita bread. Low-fat, low-sodium crackers. Whole-wheat flour tortillas. Vegetables Fresh or frozen vegetables (raw, steamed, roasted, or grilled). Low-sodium or reduced-sodium tomato and vegetable juice. Low-sodium or reduced-sodium tomato sauce and tomato paste. Low-sodium or reduced-sodium canned vegetables. Fruits All fresh, dried, or frozen fruit. Canned fruit in natural juice (without added sugar). Meat and other protein foods Skinless chicken or [place]. Ground chicken or [place]. Pork with fat trimmed off. Fish and seafood. Egg whites. Dried beans, peas, or lentils. Unsalted nuts, nut butters, and seeds. Unsalted canned beans. Lean cuts of  beef with fat trimmed off. Low-sodium, lean deli meat. Dairy Low-fat (1%) or fat-free (skim) milk. Fat-free, low-fat, or reduced-fat cheeses. Nonfat, low-sodium ricotta or cottage cheese. Low-fat or nonfat yogurt. Low-fat, low-sodium cheese. Fats and oils Soft margarine without trans fats. Vegetable oil. Low-fat, reduced-fat, or light mayonnaise and salad dressings (reduced-sodium). Canola, safflower, olive, soybean, and sunflower oils. Avocado. Seasoning and other foods Herbs. Spices. Seasoning mixes without salt. Unsalted popcorn and pretzels. Fat-free sweets. What foods are not recommended? The items listed may not be a complete list. Talk with your dietitian about what dietary choices are best for you. Grains Baked goods made with fat, such as croissants, muffins, or some breads. Dry pasta or rice meal packs. Vegetables Creamed or fried vegetables. Vegetables in a cheese sauce. Regular canned vegetables (not low-sodium or reduced-sodium). Regular canned tomato sauce and paste (not  low-sodium or reduced-sodium). Regular tomato and vegetable juice (not low-sodium or reduced-sodium). Angie Fava. Olives. Fruits Canned fruit in a light or heavy syrup. Fried fruit. Fruit in cream or butter sauce. Meat and other protein foods Fatty cuts of meat. Ribs. Fried meat. Berniece Salines. Sausage. Bologna and other processed lunch meats. Salami. Fatback. Hotdogs. Bratwurst. Salted nuts and seeds. Canned beans with added salt. Canned or smoked fish. Whole eggs or egg yolks. Chicken or [place] with skin. Dairy Whole or 2% milk, cream, and half-and-half. Whole or full-fat cream cheese. Whole-fat or sweetened yogurt. Full-fat cheese. Nondairy creamers. Whipped toppings. Processed cheese and cheese spreads. Fats and oils Butter. Stick margarine. Lard. Shortening. Ghee. Bacon fat. Tropical oils, such as coconut, palm kernel, or palm oil. Seasoning and other foods Salted popcorn and pretzels. Onion salt, garlic salt, seasoned  salt, table salt, and sea salt. Worcestershire sauce. Tartar sauce. Barbecue sauce. Teriyaki sauce. Soy sauce, including reduced-sodium. Steak sauce. Canned and packaged gravies. Fish sauce. Oyster sauce. Cocktail sauce. Horseradish that you find on the shelf. Ketchup. Mustard. Meat flavorings and tenderizers. Bouillon cubes. Hot sauce and Tabasco sauce. Premade or packaged marinades. Premade or packaged taco seasonings. Relishes. Regular salad dressings. Where to find more information:  National Heart, Lung, and [place]: https://wilson-eaton.com/  American Heart Association: www.heart.org Summary  The DASH eating plan is a healthy eating plan that has been shown to reduce high blood pressure (hypertension). It may also reduce your risk for type 2 diabetes, heart disease, and stroke.  With the DASH eating plan, you should limit salt (sodium) intake to 2,300 mg a day. If you have hypertension, you may need to reduce your sodium intake to 1,500 mg a day.  When on the DASH eating plan, aim to eat more fresh fruits and vegetables, whole grains, lean proteins, low-fat dairy, and heart-healthy fats.  Work with your health care provider or diet and nutrition specialist (dietitian) to adjust your eating plan to your individual calorie needs. This information is not intended to replace advice given to you by your health care provider. Make sure you discuss any questions you have with your health care provider. Document Released: 12/10/2010 Document Revised: 12/15/2015 Document Reviewed: 12/15/2015 Elsevier Interactive Patient Education  2018 [place]. Arwilda Georgia M.D.

## 2017-11-01 NOTE — Telephone Encounter (Signed)
Please advise Dr Panosh, thanks.   

## 2017-11-01 NOTE — Telephone Encounter (Signed)
Sig losartan 25 mg take 1 po qd  And increaes to 2 per day( 50 mg as advised ) disp 60 refill x 2

## 2017-11-02 MED ORDER — LOSARTAN POTASSIUM 25 MG PO TABS: 50.0000 mg | ORAL_TABLET | Freq: Every day | ORAL | 5 refills | Status: DC

## 2017-11-02 NOTE — Telephone Encounter (Signed)
Patient is calling to state that the pharmacy still does not have the prescription for 25mg . Please advise.

## 2017-11-02 NOTE — Telephone Encounter (Signed)
Rx changed and sent. 

## 2017-11-07 ENCOUNTER — Other Ambulatory Visit: Payer: Self-pay

## 2017-11-07 ENCOUNTER — Ambulatory Visit (HOSPITAL_COMMUNITY): Payer: BC Managed Care – PPO | Attending: Cardiovascular Disease

## 2017-11-07 DIAGNOSIS — I10 Essential (primary) hypertension: Secondary | ICD-10-CM | POA: Diagnosis present

## 2017-11-07 DIAGNOSIS — R011 Cardiac murmur, unspecified: Secondary | ICD-10-CM | POA: Diagnosis present

## 2017-11-09 ENCOUNTER — Other Ambulatory Visit: Payer: Self-pay | Admitting: Internal Medicine

## 2017-11-09 DIAGNOSIS — I38 Endocarditis, valve unspecified: Secondary | ICD-10-CM

## 2017-11-10 NOTE — Progress Notes (Signed)
Cardiology Office Note   Date:  11/10/2017   ID:  Jillian Escobar, DOB 08-Aug-1950, MRN [id number]  PCP:  Burnis Medin, MD  Cardiologist: Dr. Ellyn Hack  No chief complaint on file.    History of Present Illness: Jillian Escobar is a 67 y.o. female who presents for ongoing assessment and management of chest pressure and exertional dyspnea. Most recent NM stress test on 01/14/2015 which was a low risk, normal study. She did have an episode of syncope. It was felt to be neurocardiogenic. Deconditioning was also felt to be the etiology of DOE.   She had an echocardiogram dated 11/07/2017: Left ventricle: The cavity size was normal. There was mild   concentric hypertrophy. Systolic function was normal. The   estimated ejection fraction was in the range of 60% to 65%. Wall   motion was normal; there were no regional wall motion   abnormalities. Features are consistent with a pseudonormal left   ventricular filling pattern, with concomitant abnormal relaxation   and increased filling pressure (grade 2 diastolic dysfunction).   Doppler parameters are consistent with elevated ventricular   end-diastolic filling pressure. - Aortic valve: There was mild to moderate regurgitation directed   centrally in the LVOT.  She is very anxious about the results, as she worried that she would need surgical repair. She still has some mild DOE, but no chest pain, no significant dyspnea, no dizziness or fatigue.   Past Medical History:  Diagnosis Date  . Allergy   . Arthritis   . GERD (gastroesophageal reflux disease)   . Hx of varicella   . Hyperlipidemia    borderline  . Hypothyroidism   . Pneumonia 11/2014   Only time took inhalers to treat  . Shingles 10/26/2010  . Syncope and collapse    Unclear etiology   . Thyroid disease     Past Surgical History:  Procedure Laterality Date  . CESAREAN SECTION     x1  . CHOLECYSTECTOMY    . COLONOSCOPY    . ESOPHAGOGASTRODUODENOSCOPY (EGD) WITH  PROPOFOL N/A 12/09/2015   Procedure: ESOPHAGOGASTRODUODENOSCOPY (EGD) WITH PROPOFOL;  Surgeon: Manus Gunning, MD;  Location: [facility];  Service: Gastroenterology;  Laterality: N/A;  . EXPLORATORY LAPAROTOMY     Gallbladder  . POLYPECTOMY N/A 12/09/2015   Procedure: POLYPECTOMY;  Surgeon: Manus Gunning, MD;  Location: [facility];  Service: Gastroenterology;  Laterality: N/A;  . TUBAL LIGATION    . WISDOM TOOTH EXTRACTION       Current Outpatient Medications  Medication Sig Dispense Refill  . amoxicillin (AMOXIL) 500 MG capsule Take 1 capsule (500 mg total) by mouth 3 (three) times daily. If needed for sinusitis 21 capsule 0  . azithromycin (ZITHROMAX) 250 MG tablet Take 1 tablet (250 mg total) by mouth daily. 4 tablet 0  . Cholecalciferol (VITAMIN D PO) Take 2 tablets by mouth daily.    [name] doxycycline (VIBRA-TABS) 100 MG tablet Take 1 tablet (100 mg total) by mouth 2 (two) times daily. 10 tablet 0  . hydrOXYzine (ATARAX/VISTARIL) 25 MG tablet Take 1 tablet (25 mg total) by mouth 3 (three) times daily as needed for itching. 60 tablet 0  . ibuprofen (ADVIL,MOTRIN) 800 MG tablet Take 800 mg by mouth 2 (two) times daily as needed for headache or moderate pain.    [name] ketoconazole (NIZORAL) 2 % cream Apply 1 application topically 2 (two) times daily. 30 g 0  . levofloxacin (LEVAQUIN) 750 MG tablet Take 1 tablet (750 mg  total) by mouth daily. 5 tablet 0  . levothyroxine (SYNTHROID, LEVOTHROID) 50 MCG tablet TAKE 1 TABLET BY MOUTH EVERY DAY 90 tablet 1  . LORazepam (ATIVAN) 0.5 MG tablet Take 1-2 tablets (0.5-1 mg total) by mouth 2 (two) times daily as needed for anxiety. 20 tablet 0  . losartan (COZAAR) 25 MG tablet Take 2 tablets (50 mg total) by mouth daily. For high blood pressure 60 tablet 5  . Multiple Vitamin (MULTIVITAMIN WITH MINERALS) TABS tablet Take 1 tablet by mouth daily.    [name] nystatin cream (MYCOSTATIN) Apply 1 application topically 2 (two) times daily. Apply to  chest 30 g 0  . OVER THE COUNTER MEDICATION Place 1 Dose under the tongue 2 (two) times daily. CBD oil    . OVER THE COUNTER MEDICATION     . predniSONE (DELTASONE) 20 MG tablet 3 tabs po day one, then 2 po daily x 4 days 11 tablet 0  . ranitidine (ZANTAC) 150 MG tablet Take 150 mg by mouth 2 (two) times daily as needed for heartburn.    . TURMERIC PO Take 1 tablet by mouth daily.     No current facility-administered medications for this visit.     Allergies:   Eggs or egg-derived products and Levaquin [levofloxacin]    Social History:  The patient  reports that she quit smoking about 35 years ago. She has never used smokeless tobacco. She reports that she drinks alcohol. She reports that she does not use drugs.   Family History:  The patient's family history includes Arrhythmia in her daughter; COPD in her father; Colon cancer in her cousin; Dementia in her father; Glaucoma in her father; Heart disease in her father and mother; Ulcerative colitis in her brother and sister.    ROS: All other systems are reviewed and negative. Unless otherwise mentioned in H&P    PHYSICAL EXAM: VS:  There were no vitals taken for this visit. , BMI There is no height or weight on file to calculate BMI. GEN: Well nourished, well developed, in no acute distress HEENT: normal Neck: no JVD, carotid bruits, or masses Cardiac: RRR; soft systolic murmurs, rubs, or gallops,no edema  Respiratory:  Clear to auscultation bilaterally, normal work of breathing GI: soft, nontender, nondistended, + BS MS: no deformity or atrophy Skin: warm and dry, no rash Neuro:  Strength and sensation are intact Psych: euthymic mood, full affect   EKG:  Not completed this office visit.   Recent Labs: 10/17/2017: B Natriuretic Peptide 85.3; BUN 20; Creatinine, Ser 0.67; Hemoglobin 14.9; Platelets 271; Potassium 3.8; Sodium 143    Lipid Panel    Component Value Date/Time   CHOL 199 05/18/2016 0733   TRIG 75.0 05/18/2016  0733   HDL 52.00 05/18/2016 0733   CHOLHDL 4 05/18/2016 0733   VLDL 15.0 05/18/2016 0733   LDLCALC 132 (H) 05/18/2016 0733   LDLDIRECT 158.4 08/14/2012 0828      Wt Readings from Last 3 Encounters:  10/31/17 174 lb 4.8 oz (79.1 kg)  10/17/17 177 lb (80.3 kg)  09/20/17 174 lb (78.9 kg)      Other studies Reviewed:   ASSESSMENT AND PLAN:  1. Hypertension: She is only taking 25 mg of losartan instead of 50 mg to see how well she tolerates this. BP is controlled but was found to have grade II diastolic dysfunction. I will begin HCTZ 12.5 mg daily to help with BP. She is to increase her exercise and to avoid salt. I will  check BMET in one week.   2. AoV regurgitation: Echo revealed mild to moderate AoV regurgitation. She is given reassurance that she is not in need of surgical repair at this time. She is to keep BP under control and to report any new symptoms of DOE, dizziness, or chest pain. Recommend annual echocardiograms.   3. Deconditioning: I have recommended that she increase her activity to help with this.    Current medicines are reviewed at length with the patient today.    Labs/ tests ordered today include: BMET  Phill Myron. [place] Pugh, ANP, AACC   11/10/2017 7:41 AM    [facility] [address] Office [phone] Fax [phone]

## 2017-11-14 ENCOUNTER — Ambulatory Visit (INDEPENDENT_AMBULATORY_CARE_PROVIDER_SITE_OTHER): Payer: BC Managed Care – PPO | Admitting: Adult Health

## 2017-11-14 ENCOUNTER — Encounter: Payer: Self-pay | Admitting: Adult Health

## 2017-11-14 VITALS — BP 136/82 | HR 68 | Ht 63.0 in | Wt 173.4 lb

## 2017-11-14 DIAGNOSIS — I351 Nonrheumatic aortic (valve) insufficiency: Secondary | ICD-10-CM

## 2017-11-14 DIAGNOSIS — Z79899 Other long term (current) drug therapy: Secondary | ICD-10-CM

## 2017-11-14 DIAGNOSIS — I10 Essential (primary) hypertension: Secondary | ICD-10-CM | POA: Diagnosis not present

## 2017-11-14 MED ORDER — HYDROCHLOROTHIAZIDE 12.5 MG PO CAPS: 12.5000 mg | ORAL_CAPSULE | Freq: Every day | ORAL | 5 refills | Status: DC

## 2017-11-14 MED ORDER — LOSARTAN POTASSIUM 25 MG PO TABS: 25.0000 mg | ORAL_TABLET | Freq: Every day | ORAL | 5 refills | Status: DC

## 2017-11-14 NOTE — Patient Instructions (Signed)
Medication Instructions:  START HYDROCHLOROTHIAZIDE 12.5MG  DAILY  If you need a refill on your cardiac medications before your next appointment, please call your pharmacy.  Labwork: BMET IN ONE WEEK (11-18) HERE IN OUR OFFICE AT [facility]  Take the provided lab slips with you to the lab for your blood draw.   You will NOT need to fast   If you have labs (blood work) drawn today and your tests are completely normal, you will receive your results only by: [name] MyChart Message (if you have MyChart) OR . A paper copy in the mail If you have any lab test that is abnormal or we need to change your treatment, we will call you to review the results.  Follow-Up: You will need a follow up appointment in 1 WEEK.   You may see  DR Cecil Cranker, DNP, ANP or one of the following Advanced Practice Providers on your designated Care Team:    . Jory Sims, DNP, ANP . Rosaria Ferries, PA-C  At [facility], you and your health needs are our priority.  As part of our continuing mission to provide you with exceptional heart care, we have created designated Provider Care Teams.  These Care Teams include your primary Cardiologist (physician) and Advanced Practice Providers (APPs -  Physician Assistants and Nurse Practitioners) who all work together to provide you with the care you need, when you need it.  Thank you for choosing [facility] at [facility]!!

## 2017-11-16 ENCOUNTER — Ambulatory Visit: Payer: Self-pay | Admitting: Adult Health

## 2017-11-22 LAB — BASIC METABOLIC PANEL
BUN / CREAT RATIO: 26 (ref 12–28)
BUN: 22 mg/dL (ref 8–27)
CO2: 25 mmol/L (ref 20–29)
Calcium: 10.1 mg/dL (ref 8.7–10.3)
Chloride: 100 mmol/L (ref 96–106)
Creatinine, Ser: 0.84 mg/dL (ref 0.57–1.00)
GFR, EST AFRICAN AMERICAN: 84 mL/min/{1.73_m2} (ref >59)
GFR, EST NON AFRICAN AMERICAN: 73 mL/min/{1.73_m2} (ref >59)
Glucose: 106 mg/dL — ABNORMAL HIGH (ref 65–99)
Potassium: 3.7 mmol/L (ref 3.5–5.2)
SODIUM: 139 mmol/L (ref 134–144)

## 2017-12-18 NOTE — Progress Notes (Signed)
Cardiology Office Note   Date:  12/18/2017   ID:  Jillian Escobar, DOB [birth year], MRN [id number]  PCP:  Burnis Medin, MD  Cardiologist: Dr. Ellyn Hack No chief complaint on file.    History of Present Illness: Jillian Escobar is a 67 y.o. female who presents for ongoing assessment and management of chest pain and exertional dyspnea.  Most recent echocardiogram on 11/07/2017 revealed mild concentric hypertrophy, normal LV systolic function of 10% to 65%, and grade 2 diastolic dysfunction the patient had mild to moderate aortic valve regurgitation directed centrally in the LVOT.  I saw her on last office visit on 11/14/2017, she was found to be hypertensive but was found to only be taking 25 mg of losartan instead of 50 mg of losartan as directed.  HCTZ was also added at 12.5 mg to keep blood pressure under better control.  She was advised to increase her exercise and avoid salt.  She was also given reassurance that she did not need surgical repair of her aortic valve at that time.  She is here to assess her response to medication changes.  A BMET was also ordered. She is doing well today BP is much better on the medication changes She has more energy.  Wants to start a walking program    Past Medical History:  Diagnosis Date  . Allergy   . Arthritis   . GERD (gastroesophageal reflux disease)   . Hx of varicella   . Hyperlipidemia    borderline  . Hypothyroidism   . Pneumonia 11/2014   Only time took inhalers to treat  . Shingles 10/26/2010  . Syncope and collapse    Unclear etiology   . Thyroid disease     Past Surgical History:  Procedure Laterality Date  . CESAREAN SECTION     x1  . CHOLECYSTECTOMY    . COLONOSCOPY    . ESOPHAGOGASTRODUODENOSCOPY (EGD) WITH PROPOFOL N/A 12/09/2015   Procedure: ESOPHAGOGASTRODUODENOSCOPY (EGD) WITH PROPOFOL;  Surgeon: Manus Gunning, MD;  Location: [facility];  Service: Gastroenterology;  Laterality: N/A;  . EXPLORATORY  LAPAROTOMY     Gallbladder  . POLYPECTOMY N/A 12/09/2015   Procedure: POLYPECTOMY;  Surgeon: Manus Gunning, MD;  Location: [facility];  Service: Gastroenterology;  Laterality: N/A;  . TUBAL LIGATION    . WISDOM TOOTH EXTRACTION       Current Outpatient Medications  Medication Sig Dispense Refill  . Cholecalciferol (VITAMIN D PO) Take 2 tablets by mouth daily.    . hydrochlorothiazide (MICROZIDE) 12.5 MG capsule Take 1 capsule (12.5 mg total) by mouth daily. 30 capsule 5  . ibuprofen (ADVIL,MOTRIN) 800 MG tablet Take 800 mg by mouth 2 (two) times daily as needed for headache or moderate pain.    [name] levothyroxine (SYNTHROID, LEVOTHROID) 50 MCG tablet TAKE 1 TABLET BY MOUTH EVERY DAY 90 tablet 1  . losartan (COZAAR) 25 MG tablet Take 1 tablet (25 mg total) by mouth daily. For high blood pressure 30 tablet 5  . Multiple Vitamin (MULTIVITAMIN WITH MINERALS) TABS tablet Take 1 tablet by mouth daily.    [name] OVER THE COUNTER MEDICATION Place 1 Dose under the tongue 2 (two) times daily. CBD oil    . OVER THE COUNTER MEDICATION     . predniSONE (DELTASONE) 20 MG tablet 3 tabs po day one, then 2 po daily x 4 days 11 tablet 0  . ranitidine (ZANTAC) 150 MG tablet Take 150 mg by mouth 2 (two) times daily as needed  for heartburn.    . TURMERIC PO Take 1 tablet by mouth daily.     No current facility-administered medications for this visit.     Allergies:   Eggs or egg-derived products and Levaquin [levofloxacin]    Social History:  The patient  reports that she quit smoking about 35 years ago. She has never used smokeless tobacco. She reports current alcohol use. She reports that she does not use drugs.   Family History:  The patient's family history includes Arrhythmia in her daughter; COPD in her father; Colon cancer in her cousin; Dementia in her father; Glaucoma in her father; Heart disease in her father and mother; Ulcerative colitis in her brother and sister.    ROS: All other systems  are reviewed and negative. Unless otherwise mentioned in H&P    PHYSICAL EXAM: VS:  There were no vitals taken for this visit. , BMI There is no height or weight on file to calculate BMI. GEN: Well nourished, well developed, in no acute distress HEENT: normal Neck: no JVD, carotid bruits, or masses Cardiac: RRR; soft systolic murmur, rubs, or gallops,no edema  Respiratory:  Clear to auscultation bilaterally, normal work of breathing GI: soft, nontender, nondistended, + BS MS: no deformity or atrophy Skin: warm and dry, no rash Neuro:  Strength and sensation are intact Psych: euthymic mood, full affect   EKG:  Not completed today  Recent Labs: 10/17/2017: B Natriuretic Peptide 85.3; Hemoglobin 14.9; Platelets 271 11/21/2017: BUN 22; Creatinine, Ser 0.84; Potassium 3.7; Sodium 139    Lipid Panel    Component Value Date/Time   CHOL 199 05/18/2016 0733   TRIG 75.0 05/18/2016 0733   HDL 52.00 05/18/2016 0733   CHOLHDL 4 05/18/2016 0733   VLDL 15.0 05/18/2016 0733   LDLCALC 132 (H) 05/18/2016 0733   LDLDIRECT 158.4 08/14/2012 0828      Wt Readings from Last 3 Encounters:  11/14/17 173 lb 6.4 oz (78.7 kg)  10/31/17 174 lb 4.8 oz (79.1 kg)  10/17/17 177 lb (80.3 kg)      Other studies Reviewed: Echocardiogram 2017-11-27 Left ventricle: The cavity size was normal. There was mild   concentric hypertrophy. Systolic function was normal. The   estimated ejection fraction was in the range of 60% to 65%. Wall   motion was normal; there were no regional wall motion   abnormalities. Features are consistent with a pseudonormal left   ventricular filling pattern, with concomitant abnormal relaxation   and increased filling pressure (grade 2 diastolic dysfunction).   Doppler parameters are consistent with elevated ventricular   end-diastolic filling pressure. - Aortic valve: There was mild to moderate regurgitation directed   centrally in the LVOT.  ASSESSMENT AND PLAN:  1.  Hypertension: Excellent control of BP with addition of HCTZ and taking losartan 50 mg daily. Labs are reviewed and are found to be normal.   She is to continue her current regimen. I will see her in 6 months unless she is symptomatic.   2. Chest Pain: She is without further complaints. She is advised on a walking program and will begin at 10 minutes a day and work her way up to 30 minutes, since she has no further discomfort.   3. Aortic Regurgitation: Will follow. May need annual echos for surveillance.   Current medicines are reviewed at length with the patient today.    Labs/ tests ordered today include: None  Phill Myron. [place] Pugh, ANP, AACC   12/18/2017 4:51 PM  [address] Office [phone] Fax [phone]

## 2017-12-19 ENCOUNTER — Encounter: Payer: Self-pay | Admitting: Adult Health

## 2017-12-19 ENCOUNTER — Ambulatory Visit (INDEPENDENT_AMBULATORY_CARE_PROVIDER_SITE_OTHER): Payer: BC Managed Care – PPO | Admitting: Adult Health

## 2017-12-19 VITALS — BP 130/78 | HR 68 | Ht 63.0 in | Wt 174.8 lb

## 2017-12-19 DIAGNOSIS — I358 Other nonrheumatic aortic valve disorders: Secondary | ICD-10-CM | POA: Diagnosis not present

## 2017-12-19 DIAGNOSIS — I10 Essential (primary) hypertension: Secondary | ICD-10-CM

## 2017-12-19 MED ORDER — HYDROCHLOROTHIAZIDE 12.5 MG PO CAPS: 12.5000 mg | ORAL_CAPSULE | Freq: Every day | ORAL | 5 refills | Status: DC

## 2017-12-19 MED ORDER — LOSARTAN POTASSIUM 25 MG PO TABS: 25.0000 mg | ORAL_TABLET | Freq: Every day | ORAL | 12 refills | Status: DC

## 2017-12-19 NOTE — Patient Instructions (Signed)
Follow-Up: You will need a follow up appointment in 6 months.  Please call our office 2 months in advance (April 2020)  to schedule this (June 2020)  appointment.  You may see Glenetta Hew, MD, Jory Sims, DNP, [place] or one of the following Advanced Practice Providers on your designated Care Team:  Rosaria Ferries, PA-C Jory Sims, DNP, ANP      Medication Instructions:  NO CHANGES- Your physician recommends that you continue on your current medications as directed. Please refer to the Current Medication list given to you today. If you need a refill on your cardiac medications before your next appointment, please call your pharmacy.  Labwork: When you have labs (blood work) and your tests are completely normal, you will receive your results ONLY by [place] (if you have MyChart) -OR- A paper copy in the mail.  At [facility], you and your health needs are our priority.  As part of our continuing mission to provide you with exceptional heart care, we have created designated Provider Care Teams.  These Care Teams include your primary Cardiologist (physician) and Advanced Practice Providers (APPs -  Physician Assistants and Nurse Practitioners) who all work together to provide you with the care you need, when you need it.  Thank you for choosing [facility] at [facility]!!

## 2018-02-06 ENCOUNTER — Ambulatory Visit: Payer: Self-pay | Admitting: *Deleted

## 2018-02-06 ENCOUNTER — Telehealth: Payer: Self-pay | Admitting: Cardiology
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
## 2018-02-06 NOTE — Telephone Encounter (Signed)
Contacted pt regarding symptoms; the pt says that her right leg, from below her buttocks to her knee, feels" warm on the inside"; she states that this has been going on for about 2 weeks; the patient says that there is no rash or redness; the pt says that she has left back pain that has been going on for years that has not changed; the pt says that she has pain in her right foot since 2008; recommendation made per nurse triage protocol; pt offered and accepted appointment with Dr Regis Bill, [facility], 02/07/2018 at 0830; will route to office for notification.   Reason for Disposition . [1] Numbness or tingling in one or both feet AND [2] is a chronic symptom (recurrent or ongoing AND present > 4 weeks)  Answer Assessment - Initial Assessment Questions 1. SYMPTOM: "What is the main symptom you are concerned about?" (e.g., weakness, numbness)     Right buttocks to knee feels warm inside  2. ONSET: "When did this start?" (minutes, hours, days; while sleeping)     01/24/2018 3. LAST NORMAL: "When was the last time you were normal (no symptoms)?"     01/24/2018 4. PATTERN "Does this come and go, or has it been constant since it started?"  "Is it present now?"     Intermittent but worsening 5. CARDIAC SYMPTOMS: "Have you had any of the following symptoms: chest pain, difficulty breathing, palpitations?"     no 6. NEUROLOGIC SYMPTOMS: "Have you had any of the following symptoms: headache, dizziness, vision loss, double vision, changes in speech, unsteady on your feet?"     no 7. OTHER SYMPTOMS: "Do you have any other symptoms?"     Right foot since 2008 8. PREGNANCY: "Is there any chance you are pregnant?" "When was your last menstrual period?"     n/a  Protocols used: NEUROLOGIC DEFICIT-A-AH

## 2018-02-06 NOTE — Telephone Encounter (Signed)
Noted  

## 2018-02-06 NOTE — Telephone Encounter (Signed)
New message     Pt stated noticed for the last 2 weeks she's been feeling heat down the back of her right leg and things its getting worse . She thinks its sue to her b/p medication but doesn't know the name at this time .

## 2018-02-06 NOTE — Telephone Encounter (Signed)
Returned pt call. Pt sts that she has been having a discomfort that she describes and heat that radiates down her right leg. She denies swelling, her leg is not warm touch, she denies cramping. Pt has no other complaints. She sts that she does suffer from lower back pain. Adv pt to contact her pcp. Her leg pain may be nerve pain related to her lower back pain. Adv her to call us back if cardiac symptoms develop. She will contact her pcp. Pt is agreeable with plan and verbalizes understanding.

## 2018-02-07 ENCOUNTER — Ambulatory Visit (INDEPENDENT_AMBULATORY_CARE_PROVIDER_SITE_OTHER): Payer: BC Managed Care – PPO | Admitting: Internal Medicine

## 2018-02-07 ENCOUNTER — Encounter: Payer: Self-pay | Admitting: Internal Medicine

## 2018-02-07 VITALS — BP 106/68 | HR 82 | Temp 98.8°F | Wt 173.7 lb

## 2018-02-07 DIAGNOSIS — I10 Essential (primary) hypertension: Secondary | ICD-10-CM | POA: Diagnosis not present

## 2018-02-07 DIAGNOSIS — R209 Unspecified disturbances of skin sensation: Secondary | ICD-10-CM

## 2018-02-07 DIAGNOSIS — E039 Hypothyroidism, unspecified: Secondary | ICD-10-CM | POA: Diagnosis not present

## 2018-02-07 DIAGNOSIS — R7301 Impaired fasting glucose: Secondary | ICD-10-CM | POA: Diagnosis not present

## 2018-02-07 DIAGNOSIS — I351 Nonrheumatic aortic (valve) insufficiency: Secondary | ICD-10-CM

## 2018-02-07 LAB — CBC WITH DIFFERENTIAL/PLATELET
BASOS ABS: 0.1 10*3/uL (ref 0.0–0.1)
Basophils Relative: 1.9 % (ref 0.0–3.0)
Eosinophils Absolute: 0.1 10*3/uL (ref 0.0–0.7)
Eosinophils Relative: 2.2 % (ref 0.0–5.0)
HCT: 42.8 % (ref 36.0–46.0)
Hemoglobin: 14.8 g/dL (ref 12.0–15.0)
Lymphocytes Relative: 33.6 % (ref 12.0–46.0)
Lymphs Abs: 1.6 10*3/uL (ref 0.7–4.0)
MCHC: 34.6 g/dL (ref 30.0–36.0)
MCV: 88 fl (ref 78.0–100.0)
MONOS PCT: 9.8 % (ref 3.0–12.0)
Monocytes Absolute: 0.5 10*3/uL (ref 0.1–1.0)
Neutro Abs: 2.6 10*3/uL (ref 1.4–7.7)
Neutrophils Relative %: 52.5 % (ref 43.0–77.0)
Platelets: 372 10*3/uL (ref 150.0–400.0)
RBC: 4.86 Mil/uL (ref 3.87–5.11)
RDW: 12.9 % (ref 11.5–15.5)
WBC: 4.9 10*3/uL (ref 4.0–10.5)

## 2018-02-07 LAB — TSH: TSH: 2.4 u[IU]/mL (ref 0.35–4.50)

## 2018-02-07 LAB — HEPATIC FUNCTION PANEL
ALT: 17 U/L (ref 0–35)
AST: 15 U/L (ref 0–37)
Albumin: 4.5 g/dL (ref 3.5–5.2)
Alkaline Phosphatase: 72 U/L (ref 39–117)
Bilirubin, Direct: 0.2 mg/dL (ref 0.0–0.3)
Total Bilirubin: 0.9 mg/dL (ref 0.2–1.2)
Total Protein: 6.9 g/dL (ref 6.0–8.3)

## 2018-02-07 LAB — BASIC METABOLIC PANEL
BUN: 19 mg/dL (ref 6–23)
CALCIUM: 10.4 mg/dL (ref 8.4–10.5)
CO2: 28 meq/L (ref 19–32)
CREATININE: 0.8 mg/dL (ref 0.40–1.20)
Chloride: 103 mEq/L (ref 96–112)
GFR: 71.9 mL/min (ref >60.00)
Glucose, Bld: 102 mg/dL — ABNORMAL HIGH (ref 70–99)
Potassium: 3.8 mEq/L (ref 3.5–5.1)
Sodium: 140 mEq/L (ref 135–145)

## 2018-02-07 LAB — T4, FREE: FREE T4: 1.06 ng/dL (ref 0.60–1.60)

## 2018-02-07 LAB — HEMOGLOBIN A1C: Hgb A1c MFr Bld: 5.3 % (ref 4.6–6.5)

## 2018-02-07 LAB — MAGNESIUM: Magnesium: 2 mg/dL (ref 1.5–2.5)

## 2018-02-07 NOTE — Patient Instructions (Addendum)
This sounds like sonsory nerve irritation.  No other alarm findings .  Checking metabolic .    If  persistent or progressive we can get opinion   But at this time no other intervenetion .   ROV in 3 months  ro reassess.

## 2018-02-11 ENCOUNTER — Other Ambulatory Visit: Payer: Self-pay | Admitting: Internal Medicine

## 2018-03-22 ENCOUNTER — Telehealth: Payer: Self-pay

## 2018-03-22 NOTE — Telephone Encounter (Signed)
Spoke with pt and advised per providers we are not providing letters for work at this time unless pt has active symptoms of virus or has tested positive for COVID-19. Pt is very upset. She states she cannot work around children because she is 89. She wants to be able to work from home. 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 Pt advised Dr. Regis Bill is on vacation this week and this will not be addressed until next week. She states that is fine.

## 2018-03-22 NOTE — Telephone Encounter (Signed)
Copied from [place] [id number]. 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 Please advise when ready for pickup. XL#[phone]

## 2018-03-27 NOTE — Telephone Encounter (Signed)
lette rhas been sent via mychart pt has been notified

## 2018-03-27 NOTE — Telephone Encounter (Signed)
Please give her letter stating   Because of her age, Jillian Escobar has higher risk for [id number] complications .  Local Community spread of this virus has occurred . It is advised she plan physical distancing  to groups of persons or persons,  including children, that could transmit this virus. 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  This advice is in effect  for the next  3 weeks at which time  risk can be reassessed.   Thanks [facility]

## 2018-04-24 ENCOUNTER — Telehealth: Payer: Self-pay | Admitting: Internal Medicine

## 2018-04-24 MED ORDER — ALBUTEROL SULFATE HFA 108 (90 BASE) MCG/ACT IN AERS: 2.0000 | INHALATION_SPRAY | Freq: Four times a day (QID) | RESPIRATORY_TRACT | 0 refills | Status: DC | PRN

## 2018-04-24 NOTE — Telephone Encounter (Signed)
Ok to give one refill ( no on  Active med list)    She has upcoming appt  .

## 2018-04-24 NOTE — Telephone Encounter (Signed)
Copied from [place] [id number]. Topic: Quick Communication - Rx Refill/Question >> Apr 24, 2018  9:32 AM Reyne Dumas L wrote: Medication: albuterol inhaler  Has the patient contacted their pharmacy? No - needs new script (Agent: If no, request that the patient contact the pharmacy for the refill.) (Agent: If yes, when and what did the pharmacy advise?)  Preferred Pharmacy (with phone number or street name): [id number] - [place], [place] - [address] [phone] (Phone) [phone] (Fax)  Agent: Please be advised that RX refills may take up to 3 business days. We ask that you follow-up with your pharmacy.

## 2018-04-26 ENCOUNTER — Other Ambulatory Visit: Payer: Self-pay | Admitting: Internal Medicine

## 2018-05-05 NOTE — Progress Notes (Signed)
Virtual Visit via Video Note  I connected with@ on 05/08/18 at  9:00 AM EDT by a video enabled telemedicine application and verified that I am speaking with the correct person using two identifiers. Location patient: home Location provider:work office Persons participating in the virtual visit: patient, provider  [place] national recommendations  regarding COVID 19 pandemic   video visit is advised over in office visit for this patient.  Discussed the limitations of evaluation and management by telemedicine and  availability of in person appointments. The patient expressed understanding and agreed to proceed.   HPI: Jillian Escobar Presents for video visit and level of her 3 months face-to-face follow-up.  Sensory disturbance resolved on its own and no new symptoms.  Hypertension seems to be doing well blood pressure 128/79 111/63.  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  She does do 7000 steps a day mostly and has no chest pain or jaw pain with that but does feel winded has not used her inhaler on a regular basis preexercise.  She is noted to have had a stress test and pulmonary function tests in the remote past.  Has a follow-up with cardiology within the month for her mod  AI heart murmur.  In regard to her jaw pain she thinks taking Nexium or Pepcid has helped it is associated perhaps with heartburn and not exercise.  But she was told in the past it could be her heart.   ROS: See pertinent positives and negatives per HPI.  Past Medical History:  Diagnosis Date  . Allergy   . Arthritis   . GERD (gastroesophageal reflux disease)   . Hx of varicella   . Hyperlipidemia    borderline  . Hypothyroidism   . Pneumonia 11/2014   Only time took inhalers to treat  . Shingles 10/26/2010  . Syncope and collapse    Unclear etiology   . Thyroid disease     Past Surgical History:  Procedure  Laterality Date  . CESAREAN SECTION     x1  . CHOLECYSTECTOMY    . COLONOSCOPY    . ESOPHAGOGASTRODUODENOSCOPY (EGD) WITH PROPOFOL N/A 12/09/2015   Procedure: ESOPHAGOGASTRODUODENOSCOPY (EGD) WITH PROPOFOL;  Surgeon: Manus Gunning, MD;  Location: [facility];  Service: Gastroenterology;  Laterality: N/A;  . EXPLORATORY LAPAROTOMY     Gallbladder  . POLYPECTOMY N/A 12/09/2015   Procedure: POLYPECTOMY;  Surgeon: Manus Gunning, MD;  Location: [facility];  Service: Gastroenterology;  Laterality: N/A;  . TUBAL LIGATION    . WISDOM TOOTH EXTRACTION      Family History  Problem Relation Age of Onset  . Heart disease Mother        stent in 58 s  . COPD Father   . Heart disease Father        cabg in 24s   . Glaucoma Father   . Dementia Father   . Ulcerative colitis Sister   . Ulcerative colitis Brother   . Arrhythmia Daughter   . Colon cancer Cousin   . Colon polyps Neg Hx   . Esophageal cancer Neg Hx   . Rectal cancer Neg Hx   . Stomach cancer Neg Hx     Social History   Tobacco Use  . Smoking status: Former Smoker    Last attempt to quit: 01/04/1982    Years since quitting: 36.3  . Smokeless tobacco: Never Used  Substance Use Topics  . Alcohol use: Yes    Alcohol/week: 0.0 standard drinks    Comment: daily- 2 a night   . Drug use: No      Current Outpatient Medications:  .  albuterol (VENTOLIN HFA) 108 (90 Base) MCG/ACT inhaler, Inhale 2 puffs into the lungs every 6 (six) hours as needed for wheezing or shortness of breath., Disp: 1 Inhaler, Rfl: 0 .  Cholecalciferol (VITAMIN D PO), Take 2 tablets by mouth daily., Disp: , Rfl:  .  DUEXIS 800-26.6 MG TABS, Take 1 tablet by mouth daily as needed., Disp: , Rfl: 2 .  hydrochlorothiazide (MICROZIDE) 12.5 MG capsule, Take 1 capsule (12.5 mg total) by mouth daily., Disp: 30 capsule, Rfl: 5 .  ibuprofen (ADVIL,MOTRIN) 800 MG tablet, Take 800 mg by mouth 2 (two) times daily as needed for headache or moderate  pain., Disp: , Rfl:  .  levothyroxine (SYNTHROID, LEVOTHROID) 50 MCG tablet, TAKE 1 TABLET BY MOUTH EVERY DAY, Disp: 90 tablet, Rfl: 1 .  losartan (COZAAR) 25 MG tablet, TAKE 2 TABLETS (50 MG TOTAL) BY MOUTH DAILY. FOR HIGH BLOOD PRESSURE, Disp: 180 tablet, Rfl: 1 .  Multiple Vitamin (MULTIVITAMIN WITH MINERALS) TABS tablet, Take 1 tablet by mouth daily., Disp: , Rfl:  .  OVER THE COUNTER MEDICATION, Place 1 Dose under the tongue 2 (two) times daily. CBD oil, Disp: , Rfl:  .  OVER THE COUNTER MEDICATION, , Disp: , Rfl:  .  predniSONE (DELTASONE) 20 MG tablet, 3 tabs po day one, then 2 po daily x 4 days, Disp: 11 tablet, Rfl: 0 .  ranitidine (ZANTAC) 150 MG tablet, Take 150 mg by mouth 2 (two) times daily as needed for heartburn., Disp: , Rfl:  .  TURMERIC PO, Take 1 tablet by mouth daily., Disp: , Rfl:   EXAM: BP Readings from Last 3 Encounters:  02/07/18 106/68  12/19/17 130/78  11/14/17 136/82    VITALS per patient if applicable:110/63   GENERAL: alert, oriented, appears well and in no acute distress  HEENT: atraumatic, conjunttiva clear, no obvious abnormalities on inspection of external nose and ears  NECK: normal movements of the head and neck  LUNGS: on inspection no signs of respiratory distress, breathing rate appears normal, no obvious gross SOB, gasping or wheezing  CV: no obvious cyanosis  MS: moves all visible extremities without noticeable abnormality  PSYCH/NEURO: pleasant and cooperative, no obvious depression or anxiety, speech and thought processing grossly intact Lab Results  Component Value Date   WBC 4.9 02/07/2018   HGB 14.8 02/07/2018   HCT 42.8 02/07/2018   PLT 372.0 02/07/2018   GLUCOSE 102 (H) 02/07/2018   CHOL 199 05/18/2016   TRIG 75.0 05/18/2016   HDL 52.00 05/18/2016   LDLDIRECT 158.4 08/14/2012   LDLCALC 132 (H) 05/18/2016   ALT 17 02/07/2018   AST 15 02/07/2018   NA 140 02/07/2018   K 3.8 02/07/2018   CL 103 02/07/2018   CREATININE 0.80  02/07/2018   BUN 19 02/07/2018   CO2 28 02/07/2018   TSH 2.40 02/07/2018   HGBA1C 5.3 02/07/2018    ASSESSMENT AND PLAN:  Discussed the following assessment and plan:  Essential hypertension - Plan: Basic metabolic panel, Hemoglobin A1c, Lipid panel  Fasting hyperglycemia - Plan: Basic metabolic panel, Hemoglobin A1c, Lipid panel  Exertional shortness of breath - Plan: Basic metabolic panel, Hemoglobin A1c, Lipid panel  Hyperlipidemia, unspecified hyperlipidemia type - Plan: Basic metabolic panel, Hemoglobin A1c, Lipid panel  Aortic  valve insufficiency, etiology of cardiac valve disease unspecified   Numbness went away ? With activity  bp controlled  Use  inhaler pre walk exercise  Ask cards about the sx  Jaw pain  with "indigestion"  at end of day and prob  GI cause   Will leave to cards  To op[ine about whether updated stress tets and pftss.  Are in   Order  Plan cpx in late July  early AUgust  Lab orders pre visit   Due for cholesterol  And updated  Chem ( pt to call about this)  Also  immunizations and ask a bout PAP etc  Counseled.   Expectant management and discussion of plan and treatment with patient with opportunity to ask questions and all were answered. The patient agreed with the plan and demonstrated an understanding of the instructions.   The patient was advised to call back or seek an in-person evaluation if worsening  or having concerns .     Shanon Ace, MD

## 2018-05-08 ENCOUNTER — Other Ambulatory Visit: Payer: Self-pay

## 2018-05-08 ENCOUNTER — Encounter: Payer: Self-pay | Admitting: Internal Medicine

## 2018-05-08 ENCOUNTER — Ambulatory Visit (INDEPENDENT_AMBULATORY_CARE_PROVIDER_SITE_OTHER): Payer: BC Managed Care – PPO | Admitting: Internal Medicine

## 2018-05-08 DIAGNOSIS — I351 Nonrheumatic aortic (valve) insufficiency: Secondary | ICD-10-CM

## 2018-05-08 DIAGNOSIS — R0602 Shortness of breath: Secondary | ICD-10-CM

## 2018-05-08 DIAGNOSIS — E785 Hyperlipidemia, unspecified: Secondary | ICD-10-CM | POA: Diagnosis not present

## 2018-05-08 DIAGNOSIS — R7301 Impaired fasting glucose: Secondary | ICD-10-CM

## 2018-05-08 DIAGNOSIS — I10 Essential (primary) hypertension: Secondary | ICD-10-CM

## 2018-06-21 ENCOUNTER — Telehealth: Payer: Self-pay | Admitting: *Deleted

## 2018-06-21 NOTE — Telephone Encounter (Signed)
   TELEPHONE CALL NOTE  This patient has been deemed a candidate for follow-up tele-health visit to limit community exposure during the Covid-19 pandemic. I spoke with the patient via phone to discuss instructions.  The patient will receive a phone call 2-3 days prior to their E-Visit at which time consent will be verbally confirmed.   A Virtual Office Visit appointment type has been scheduled for  6/23 with[facility], with "VIDEO/TEXT" .  I have  confirmed the patient is active in [place].  Raiford Simmonds, RN 06/21/2018 2:58 PM

## 2018-06-22 ENCOUNTER — Telehealth: Payer: Self-pay | Admitting: Cardiology

## 2018-06-22 NOTE — Telephone Encounter (Signed)
Mychart, smartphone, consent, pre reg complete 06/22/18 AF °

## 2018-06-27 ENCOUNTER — Telehealth: Payer: Self-pay | Admitting: *Deleted

## 2018-06-27 ENCOUNTER — Encounter: Payer: Self-pay | Admitting: Cardiology

## 2018-06-27 ENCOUNTER — Telehealth (INDEPENDENT_AMBULATORY_CARE_PROVIDER_SITE_OTHER): Payer: BC Managed Care – PPO | Admitting: Cardiology

## 2018-06-27 VITALS — BP 109/69 | Ht 63.0 in | Wt 168.0 lb

## 2018-06-27 DIAGNOSIS — Z8249 Family history of ischemic heart disease and other diseases of the circulatory system: Secondary | ICD-10-CM

## 2018-06-27 DIAGNOSIS — I10 Essential (primary) hypertension: Secondary | ICD-10-CM

## 2018-06-27 DIAGNOSIS — R0602 Shortness of breath: Secondary | ICD-10-CM

## 2018-06-27 DIAGNOSIS — R55 Syncope and collapse: Secondary | ICD-10-CM

## 2018-06-27 NOTE — Assessment & Plan Note (Addendum)
She still has exertional dyspnea, probably related to deconditioning, but she is really making a concerted effort to exercise.  Only a little bit improved with with her inhaler.  There is been a suggestion, possibly evaluating pulmonary function.  I think one good way to look at both pulmonary function and cardiac function as well as conditioning would be a CPX.  (Cardiopulmonary Exercise Stress Test) --> we will need to confirm that the is is currently being performed during the COVID-19 restriction.  Would like to try to get this done in the fall if possible.

## 2018-06-27 NOTE — Assessment & Plan Note (Signed)
Her blood pressure had gone up a little bit, and is now been started on little bit of HCTZ along with the increased dose of Cozaar to 50 mg.  Blood pressure looks great today.  No changes.

## 2018-06-27 NOTE — Assessment & Plan Note (Signed)
PCP is due to be checking her labs in the next couple weeks.  Last LDL levels are pretty high.  Would probably suggest given her age, presence of hypertension and borderline obesity, would probably shoot for an LDL of less than or equal to 100.  Will defer to PCP, low threshold to consider statin.

## 2018-06-27 NOTE — Progress Notes (Signed)
Virtual Visit via Video Note   This visit type was conducted due to national recommendations for restrictions regarding the COVID-19 Pandemic (e.g. social distancing) in an effort to limit this patient's exposure and mitigate transmission in our community.  Due to her co-morbid illnesses, this patient is at least at moderate risk for complications without adequate follow up.  This format is felt to be most appropriate for this patient at this time.  All issues noted in this document were discussed and addressed.  A limited physical exam was performed with this format.  Please refer to the patient's chart for her consent to telehealth for [facility].   Patient has given verbal permission to conduct this visit via virtual appointment and to bill insurance 06/27/2018 10:22 PM     Evaluation Performed:  Follow-up visit  Date:  06/27/2018   ID:  Jillian, Escobar 1950-12-08, MRN [id number]  Patient Location: Home Provider Location: Home  PCP:  Burnis Medin, MD  Cardiologist:  Glenetta Hew, MD  Electrophysiologist:  None   Chief Complaint: 15-month follow-up  History of Present Illness:    Jillian Escobar is a 68 y.o. female with PMH notable for HTN & mild-mod AI below who presents via audio/video conferencing for a telehealth visit today.  I have not seen [name] since 2017 when she was being evaluated for some chest discomfort, exertional dyspnea as well as near syncope.  Jillian Escobar was last seen by Jory Sims, NP back in December 2019 --this was in follow-up for a visit in November where she was noted to be hypertensive (not taking the full 25 mg of losartan), also HCTZ was added. -->  Was doing better now. No further chest pains.  Starting back smoking.  Interval History: Diane change in doing really relatively well from a cardiac standpoint with exception of exertional dyspnea if she is going up steps or carrying something up a hill.  She has been quite diligent  about exercising and increasing her level overall activity.  She says she does not really get short of breath walking uphill or walking flat, only if she has to go up steps.  Somewhat improved with using inhaler, but not necessarily.  Her blood pressure has been much better controlled and she does not have any edema since the addition of HCTZ.  She does note that her heart rate probably goes little faster than usual when she is or starts walking, but otherwise is doing fine. No PND, orthopnea.  No syncope/near syncope or TIA shows amaurosis fugax. Despite having exertional dyspnea, she only occasionally will note a little bit of tightness in her chest if she is having a hard time breathing but not the other way around.  She says sometimes when she gets little short of breath walking upstairs she may get a little dizzy, but no real noted near syncope type symptoms.  No TIA shows amorous fugax.  No claudication.  Cardiovascular ROS: positive for - dyspnea on exertion and as noted - only up stairs negative for - chest pain, edema, irregular heartbeat, loss of consciousness, orthopnea, paroxysmal nocturnal dyspnea, rapid heart rate, shortness of breath or TIA/amaurosis fugax, syncope/near syncope   The patient does not have symptoms concerning for COVID-19 infection (fever, chills, cough, or new shortness of breath).  The patient is practicing social distancing.  ROS:  Please see the history of present illness.    Review of Systems  Constitutional: Negative for weight loss.  HENT: Negative  for congestion (some).   Respiratory: Positive for cough (very occasional - related to allergies), shortness of breath (per HPI) and wheezing.   Cardiovascular: Negative for claudication.  Gastrointestinal: Negative for blood in stool, constipation, heartburn, melena, nausea and vomiting.  Genitourinary: Negative for hematuria.  Musculoskeletal: Negative for back pain and falls.  Endo/Heme/Allergies: Positive for  environmental allergies.  Psychiatric/Behavioral: Negative for memory loss. The patient is not nervous/anxious and does not have insomnia.   All other systems reviewed and are negative.   Past Medical History:  Diagnosis Date  . Allergy   . Arthritis   . GERD (gastroesophageal reflux disease)   . Hx of varicella   . Hyperlipidemia    borderline  . Hypothyroidism   . Pneumonia 11/2014   Only time took inhalers to treat  . Shingles 10/26/2010  . Syncope and collapse    Unclear etiology   . Thyroid disease    Past Surgical History:  Procedure Laterality Date  . CESAREAN SECTION     x1  . CHOLECYSTECTOMY    . COLONOSCOPY    . ESOPHAGOGASTRODUODENOSCOPY (EGD) WITH PROPOFOL N/A 12/09/2015   Procedure: ESOPHAGOGASTRODUODENOSCOPY (EGD) WITH PROPOFOL;  Surgeon: Manus Gunning, MD;  Location: [facility];  Service: Gastroenterology;  Laterality: N/A;  . EXPLORATORY LAPAROTOMY     Gallbladder  . POLYPECTOMY N/A 12/09/2015   Procedure: POLYPECTOMY;  Surgeon: Manus Gunning, MD;  Location: [facility];  Service: Gastroenterology;  Laterality: N/A;  . TUBAL LIGATION    . WISDOM TOOTH EXTRACTION       Myoview:  01/14/15     Study Highlights     Nuclear stress EF: 71%.  The left ventricular ejection fraction is hyperdynamic (>65%).  There was no ST segment deviation noted during stress.  The study is normal / low risk study.  Normal exercise nuclear study demonstrating normal myocardial perfusion and function, EF 71% post stress. The patient exercised to a peak HR of 144 ((91% APMHR) and workload of 9.5 mets. Duke treadmill score 9.    Current Meds  Medication Sig  . albuterol (VENTOLIN HFA) 108 (90 Base) MCG/ACT inhaler Inhale 2 puffs into the lungs every 6 (six) hours as needed for wheezing or shortness of breath.  . Cholecalciferol (VITAMIN D PO) Take 2 tablets by mouth daily.  . DUEXIS 800-26.6 MG TABS Take 1 tablet by mouth daily as needed.  .  hydrochlorothiazide (MICROZIDE) 12.5 MG capsule Take 1 capsule (12.5 mg total) by mouth daily.  [name] levothyroxine (SYNTHROID, LEVOTHROID) 50 MCG tablet TAKE 1 TABLET BY MOUTH EVERY DAY  . losartan (COZAAR) 25 MG tablet TAKE 2 TABLETS (50 MG TOTAL) BY MOUTH DAILY. FOR HIGH BLOOD PRESSURE  . Multiple Vitamin (MULTIVITAMIN WITH MINERALS) TABS tablet Take 1 tablet by mouth daily.  [name] OVER THE COUNTER MEDICATION Place 1 Dose under the tongue 2 (two) times daily. CBD oil   Due for Lipids in ~2 weeks.     Allergies:   Eggs or egg-derived products and Levaquin [levofloxacin]   Social History   Tobacco Use  . Smoking status: Former Smoker    Quit date: 01/04/1982    Years since quitting: 36.5  . Smokeless tobacco: Never Used  Substance Use Topics  . Alcohol use: Yes    Alcohol/week: 0.0 standard drinks    Comment: daily- 2 a night   . Drug use: No     Family Hx: The patient's family history includes Arrhythmia in her daughter; COPD in her father; Colon  cancer in her cousin; Dementia in her father; Glaucoma in her father; Heart disease in her father and mother; Ulcerative colitis in her brother and sister. There is no history of Colon polyps, Esophageal cancer, Rectal cancer, or Stomach cancer.   Prior CV studies:   The following studies were reviewed today: . Echocardiogram October 2019 (full normal).  EF 60 to 65%.  No R WMA.  GRII DD.  Mild-moderate AI, sclerosis without stenosis  Labs/Other Tests and Data Reviewed:    EKG:  No ECG reviewed.  Recent Labs: 10/17/2017: B Natriuretic Peptide 85.3 02/07/2018: ALT 17; BUN 19; Creatinine, Ser 0.80; Hemoglobin 14.8; Magnesium 2.0; Platelets 372.0; Potassium 3.8; Sodium 140; TSH 2.40   Recent Lipid Panel Lab Results  Component Value Date/Time   CHOL 199 05/18/2016 07:33 AM   TRIG 75.0 05/18/2016 07:33 AM   HDL 52.00 05/18/2016 07:33 AM   CHOLHDL 4 05/18/2016 07:33 AM   LDLCALC 132 (H) 05/18/2016 07:33 AM   LDLDIRECT 158.4 08/14/2012 08:28  AM    Wt Readings from Last 3 Encounters:  06/27/18 168 lb (76.2 kg)  02/07/18 173 lb 11.2 oz (78.8 kg)  12/19/17 174 lb 12.8 oz (79.3 kg)     Objective:    Vital Signs:  BP 109/69   Ht 5\' 3"  (1.6 m)   Wt 168 lb (76.2 kg)   BMI 29.76 kg/m   VITAL SIGNS:  reviewed Well nourished, well developed female in n acute distress. A&O x 3.  normal Mood & Affect Non-labored respirations No edema  ASSESSMENT & PLAN:    Problem List Items Addressed This Visit    Syncope and collapse    No further episodes since I last saw her back in 2017.  Seems to have been vasovagal with dehydration.  Continue to monitor.      Family history of premature CAD (Chronic)    PCP is due to be checking her labs in the next couple weeks.  Last LDL levels are pretty high.  Would probably suggest given her age, presence of hypertension and borderline obesity, would probably shoot for an LDL of less than or equal to 100.  Will defer to PCP, low threshold to consider statin.      Relevant Orders   Cardiopulmonary exercise test   Exertional shortness of breath - Primary    She still has exertional dyspnea, probably related to deconditioning, but she is really making a concerted effort to exercise.  Only a little bit improved with with her inhaler.  There is been a suggestion, possibly evaluating pulmonary function.  I think one good way to look at both pulmonary function and cardiac function as well as conditioning would be a CPX.  (Cardiopulmonary Exercise Stress Test) --> we will need to confirm that the is is currently being performed during the COVID-19 restriction.  Would like to try to get this done in the fall if possible.      Relevant Orders   Cardiopulmonary exercise test   Essential hypertension (Chronic)    Her blood pressure had gone up a little bit, and is now been started on little bit of HCTZ along with the increased dose of Cozaar to 50 mg.  Blood pressure looks great today.  No changes.       Relevant Orders   Cardiopulmonary exercise test      COVID-19 Education: The signs and symptoms of COVID-19 were discussed with the patient and how to seek care for testing (follow up with PCP  or arrange E-visit).   The importance of social distancing was discussed today.  Time:   Today, I have spent 17 minutes with the patient with telehealth technology discussing the above problems.  8 min spent charting & with chart review.   Medication Adjustments/Labs and Tests Ordered: Current medicines are reviewed at length with the patient today.  Concerns regarding medicines are outlined above.  Medication Instructions:  none  Tests Ordered: Orders Placed This Encounter  Procedures  . Cardiopulmonary exercise test    CardioPulmonary Exercise Stress Test (CPX)  Medication Changes: No orders of the defined types were placed in this encounter.   Disposition:  Follow up in 1 year(s) - unless ST is abnormal.     Signed, Glenetta Hew, MD  06/27/2018 10:22 PM    [place]

## 2018-06-27 NOTE — Telephone Encounter (Signed)
SPOKE TO PATIENT - INSTRUCTION GIVEN TELE-VISIT 06/27/18 . AVS SUMMARY WILL BE SENT VIA MYCHART. PATIENT VERBALIZED UNDERSTANDING - CPX ORDERED PATIENT AWARE TO CONTACT OFFICE IF NO RESPONSE BY AUG 2020

## 2018-06-27 NOTE — Patient Instructions (Addendum)
Medication Instructions:  none  If you need a refill on your cardiac medications before your next appointment, please call your pharmacy.   Lab work: Per PCP - pls ask her to forward to Dr. Ellyn Hack.    Testing/Procedures: WILL BE SCHEDULE AT [facility] - IF SOMEONE HAS NOT CONTACTED BY AUG 2020 CALL OFFICE. CardioPulmonary Exercise Stress Test (CPX)  Your physician has recommended that you have a cardiopulmonary stress test (CPX). CPX testing is a non-invasive measurement of heart and lung function. It replaces a traditional treadmill stress test. This type of test provides a tremendous amount of information that relates not only to your present condition but also for future outcomes. This test combines measurements of you ventilation, respiratory gas exchange in the lungs, electrocardiogram (EKG), blood pressure and physical response before, during, and following an exercise protocol.   Follow-Up: At [facility], you and your health needs are our priority.  As part of our continuing mission to provide you with exceptional heart care, we have created designated Provider Care Teams.  These Care Teams include your primary Cardiologist (physician) and Advanced Practice Providers (APPs -  Physician Assistants and Nurse Practitioners) who all work together to provide you with the care you need, when you need it. . You will need a follow up appointment in 12 months  June 2021.  Please call our office 2 months in advance to schedule this appointment.  You may see Glenetta Hew, MD or one of the following Advanced Practice Providers on your designated Care Team:   . Rosaria Ferries, PA-C . Jory Sims, DNP, ANP  --If test is abnormal, we will have you come back sooner.  Any Other Special Instructions Will Be Listed Below (If Applicable).

## 2018-06-27 NOTE — Assessment & Plan Note (Signed)
No further episodes since I last saw her back in 2017.  Seems to have been vasovagal with dehydration.  Continue to monitor.

## 2018-07-04 ENCOUNTER — Other Ambulatory Visit: Payer: Self-pay | Admitting: Internal Medicine

## 2018-07-25 ENCOUNTER — Other Ambulatory Visit: Payer: Self-pay

## 2018-07-25 MED ORDER — HYDROCHLOROTHIAZIDE 12.5 MG PO CAPS: 12.5000 mg | ORAL_CAPSULE | Freq: Every day | ORAL | 5 refills | Status: DC

## 2018-07-26 ENCOUNTER — Other Ambulatory Visit (INDEPENDENT_AMBULATORY_CARE_PROVIDER_SITE_OTHER): Payer: BC Managed Care – PPO

## 2018-07-26 ENCOUNTER — Other Ambulatory Visit: Payer: Self-pay

## 2018-07-26 DIAGNOSIS — R0602 Shortness of breath: Secondary | ICD-10-CM | POA: Diagnosis not present

## 2018-07-26 DIAGNOSIS — R7301 Impaired fasting glucose: Secondary | ICD-10-CM | POA: Diagnosis not present

## 2018-07-26 DIAGNOSIS — I10 Essential (primary) hypertension: Secondary | ICD-10-CM | POA: Diagnosis not present

## 2018-07-26 DIAGNOSIS — E785 Hyperlipidemia, unspecified: Secondary | ICD-10-CM | POA: Diagnosis not present

## 2018-07-26 LAB — BASIC METABOLIC PANEL
BUN: 16 mg/dL (ref 6–23)
CO2: 26 mEq/L (ref 19–32)
Calcium: 9.6 mg/dL (ref 8.4–10.5)
Chloride: 104 mEq/L (ref 96–112)
Creatinine, Ser: 0.8 mg/dL (ref 0.40–1.20)
GFR: 71.8 mL/min (ref >60.00)
Glucose, Bld: 96 mg/dL (ref 70–99)
Potassium: 3.7 mEq/L (ref 3.5–5.1)
Sodium: 141 mEq/L (ref 135–145)

## 2018-07-26 LAB — HEMOGLOBIN A1C: Hgb A1c MFr Bld: 5.4 % (ref 4.6–6.5)

## 2018-07-26 LAB — LIPID PANEL
Cholesterol: 187 mg/dL (ref 0–200)
HDL: 47.6 mg/dL (ref >39.00)
LDL Cholesterol: 121 mg/dL — ABNORMAL HIGH (ref 0–99)
NonHDL: 139.07
Total CHOL/HDL Ratio: 4
Triglycerides: 92 mg/dL (ref 0.0–149.0)
VLDL: 18.4 mg/dL (ref 0.0–40.0)

## 2018-07-28 ENCOUNTER — Other Ambulatory Visit: Payer: Self-pay | Admitting: Internal Medicine

## 2018-08-01 NOTE — Progress Notes (Signed)
Chief Complaint  Patient presents with  . Annual Exam    no concerns today     HPI: Patient  Jillian Escobar  68 y.o. comes in today for Preventive Health Care visit  To retire oct 1 on fmla at this time  Doing better BP good  Range now on lower dose of losartan 25 mg  Cards follow sx sem to be betr  Stress test  Delayed  Hs  Of AI following  No hx abn pap menopause in about ? 205 range . Not hight risk    Health Maintenance  Topic Date Due  . Hepatitis C Screening  [birth year]  . HIV Screening  09/[birth year]/1967  . PAP SMEAR-Modifier  08/22/2015  . DEXA SCAN  09/24/2017  . PNA vac Low Risk Adult (1 of 2 - PCV13) 09/24/2017  . MAMMOGRAM  08/03/2018 (Originally 02/25/2018)  . INFLUENZA VACCINE  08/05/2018  . TETANUS/TDAP  03/01/2019  . COLONOSCOPY  07/22/2019   Health Maintenance Review LIFESTYLE:  Exercise:  Not as much Tobacco/ETS:n Alcohol: ocass Sugar beverages:n Sleep:ok better  Drug use: no HH of 2 Work:retiring oct on fmla hel pwith her mom     ROS:  GEN/ HEENT: No fever, significant weight changes sweats headaches vision problems hearing changes, CV/ PULM; No chest pain shortness of breath cough, syncope,edema  change in exercise tolerance. GI /GU: No adominal pain, vomiting, change in bowel habits. No blood in the stool. No significant GU symptoms. SKIN/HEME: ,no acute skin rashes suspicious lesions or bleeding. No lymphadenopathy, nodules, masses.  NEURO/ PSYCH:  No neurologic signs such as weakness numbness. No depression anxiety. IMM/ Allergy: No unusual infections.  Allergy .   REST of 12 system review negative except as per HPI   Past Medical History:  Diagnosis Date  . Allergy   . Arthritis   . GERD (gastroesophageal reflux disease)   . Hx of varicella   . Hyperlipidemia    borderline  . Hypothyroidism   . Pneumonia 11/2014   Only time took inhalers to treat  . Shingles 10/26/2010  . Syncope and collapse    Unclear etiology   . Thyroid disease      Past Surgical History:  Procedure Laterality Date  . CESAREAN SECTION     x1  . CHOLECYSTECTOMY    . COLONOSCOPY    . ESOPHAGOGASTRODUODENOSCOPY (EGD) WITH PROPOFOL N/A 12/09/2015   Procedure: ESOPHAGOGASTRODUODENOSCOPY (EGD) WITH PROPOFOL;  Surgeon: Manus Gunning, MD;  Location: [facility];  Service: Gastroenterology;  Laterality: N/A;  . EXPLORATORY LAPAROTOMY     Gallbladder  . POLYPECTOMY N/A 12/09/2015   Procedure: POLYPECTOMY;  Surgeon: Manus Gunning, MD;  Location: [facility];  Service: Gastroenterology;  Laterality: N/A;  . TUBAL LIGATION    . WISDOM TOOTH EXTRACTION      Family History  Problem Relation Age of Onset  . Heart disease Mother        stent in 24 s  . COPD Father   . Heart disease Father        cabg in 42s   . Glaucoma Father   . Dementia Father   . Ulcerative colitis Sister   . Ulcerative colitis Brother   . Arrhythmia Daughter   . Colon cancer Cousin   . Colon polyps Neg Hx   . Esophageal cancer Neg Hx   . Rectal cancer Neg Hx   . Stomach cancer Neg Hx     Social History   Socioeconomic History  .  Marital status: Married    Spouse name: Not on file  . Number of children: 1  . Years of education: Not on file  . Highest education level: Not on file  Occupational History  . Not on file  Social Needs  . Financial resource strain: Not on file  . Food insecurity    Worry: Not on file    Inability: Not on file  . Transportation needs    Medical: Not on file    Non-medical: Not on file  Tobacco Use  . Smoking status: Former Smoker    Quit date: 01/04/1982    Years since quitting: 36.6  . Smokeless tobacco: Never Used  Substance and Sexual Activity  . Alcohol use: Yes    Alcohol/week: 0.0 standard drinks    Comment: daily- 2 a night   . Drug use: No  . Sexual activity: Not on file  Lifestyle  . Physical activity    Days per week: Not on file    Minutes per session: Not on file  . Stress: Not on file   Relationships  . Social Herbalist on phone: Not on file    Gets together: Not on file    Attends religious service: Not on file    Active member of club or organization: Not on file    Attends meetings of clubs or organizations: Not on file    Relationship status: Not on file  Other Topics Concern  . Not on file  Social History Narrative   H H of 2    Married, G2P1   Former tobacco;drinks beer wine   Walks maybe 30 minutes or more days a week. Doesn't do more than that because she is "lazy"   Pet 2 dog and 2 cats .   TA [place] pre K and K   Originally from [place], moved over 20 years ago    Outpatient Medications Prior to Visit  Medication Sig Dispense Refill  . albuterol (VENTOLIN HFA) 108 (90 Base) MCG/ACT inhaler TAKE 2 PUFFS BY MOUTH EVERY 6 HOURS AS NEEDED FOR WHEEZE OR SHORTNESS OF BREATH 1 g 0  . Cholecalciferol (VITAMIN D PO) Take 2 tablets by mouth daily.    . DUEXIS 800-26.6 MG TABS Take 1 tablet by mouth daily as needed.  2  . hydrochlorothiazide (MICROZIDE) 12.5 MG capsule Take 1 capsule (12.5 mg total) by mouth daily. 30 capsule 5  . levothyroxine (SYNTHROID) 50 MCG tablet TAKE 1 TABLET BY MOUTH EVERY DAY 90 tablet 1  . Multiple Vitamin (MULTIVITAMIN WITH MINERALS) TABS tablet Take 1 tablet by mouth daily.    [name] OVER THE COUNTER MEDICATION Place 1 Dose under the tongue 2 (two) times daily. CBD oil    . losartan (COZAAR) 25 MG tablet TAKE 2 TABLETS (50 MG TOTAL) BY MOUTH DAILY. FOR HIGH BLOOD PRESSURE 180 tablet 1   No facility-administered medications prior to visit.      EXAM:  BP 128/70 (BP Location: Right Arm, Patient Position: Sitting, Cuff Size: Normal)   Pulse 85   Temp 98.4 F (36.9 C) (Oral)   Ht 5\' 4"  (1.626 m)   Wt 171 lb 3.2 oz (77.7 kg)   SpO2 98%   BMI 29.39 kg/m   Body mass index is 29.39 kg/m. Wt Readings from Last 3 Encounters:  08/02/18 171 lb 3.2 oz (77.7 kg)  06/27/18 168 lb (76.2 kg)  02/07/18 173  lb 11.2 oz (78.8 kg)  Physical Exam: Vital signs reviewed JJO:ACZY is a well-developed well-nourished alert cooperative    who appearsr stated age in no acute distress.  HEENT: normocephalic atraumatic , Eyes: PERRL EOM's full, conjunctiva clear, Nares: paten,t no deformity discharge or tenderness., Ears: no deformity EAC's clear TMs with normal landmarks. Mouth masked  NECK: supple without masses, thyromegaly or bruits. CHEST/PULM:  Clear to auscultation and percussion breath sounds equal no wheeze , rales or rhonchi. No chest wall deformities or tenderness. Breast: normal by inspection . No dimpling, discharge, masses, tenderness or discharge . CV: PMI is nondisplaced, S1 S2 no gallops, murmurs, rubs. Peripheral pulses are full without delay.No JVD .  ABDOMEN: Bowel sounds normal nontender  No guard or rebound, no hepato splenomegal no CVA tenderness. . Extremtities:  No clubbing cyanosis or edema, no acute joint swelling or redness no focal atrophy NEURO:  Oriented x3, cranial nerves 3-12 appear to be intact, no obvious focal weakness,gait within normal limits no abnormal reflexes or asymmetrical SKIN: No acute rashes normal turgor, color, no bruising or petechiae. PSYCH: Oriented, good eye contact, no obvious depression anxiety, cognition and judgment appear normal. LN: no cervical axillary inguinal adenopathy Pelvic: NL ext GU, labia clear without lesions or rash . Vagina no lesions .Cervix: clear  UTERUS: Neg CMT Adnexa:  clear no masses . PAP done high  risk hpv.    Lab Results  Component Value Date   WBC 4.9 02/07/2018   HGB 14.8 02/07/2018   HCT 42.8 02/07/2018   PLT 372.0 02/07/2018   GLUCOSE 96 07/26/2018   CHOL 187 07/26/2018   TRIG 92.0 07/26/2018   HDL 47.60 07/26/2018   LDLDIRECT 158.4 08/14/2012   LDLCALC 121 (H) 07/26/2018   ALT 17 02/07/2018   AST 15 02/07/2018   NA 141 07/26/2018   K 3.7 07/26/2018   CL 104 07/26/2018   CREATININE 0.80 07/26/2018   BUN 16  07/26/2018   CO2 26 07/26/2018   TSH 2.40 02/07/2018   HGBA1C 5.4 07/26/2018    BP Readings from Last 3 Encounters:  08/02/18 128/70  06/27/18 109/69  02/07/18 106/68    Lab results reviewed with patient   ASSESSMENT AND PLAN:  Discussed the following assessment and plan:    ICD-10-CM   1. Visit for preventive health examination  Z00.00   2. Medication management  Z79.899   3. Essential hypertension  I10   4. Fasting hyperglycemia  R73.01   5. Hyperlipidemia, unspecified hyperlipidemia type  E78.5   6. Hypothyroidism, unspecified type  E03.9   7. Estrogen deficiency  E28.39 DG Bone Density  8. Screening for cervical cancer  Z12.4 PAP [[place]]  9. Need for pneumococcal vaccination  Z23 Pneumococcal conjugate vaccine 13-valent IM  10. Exertional shortness of breath  R06.02   plan yearly cpx  But will need labs in feb 2021   Patient Care Team: Burnis Medin, MD as PCP - General Ellyn Hack Leonie Green, MD as PCP - Cardiology (Cardiology) Patient Instructions    Get mammo and dexa scan  At breast cneter Will send labs results  to cardiology      Health Maintenance, Female Adopting a healthy lifestyle and getting preventive care are important in promoting health and wellness. Ask your health care provider about:  The right schedule for you to have regular tests and exams.  Things you can do on your own to prevent diseases and keep yourself healthy. What should I know about diet, weight, and exercise? Eat a healthy  diet   Eat a diet that includes plenty of vegetables, fruits, low-fat dairy products, and lean protein.  Do not eat a lot of foods that are high in solid fats, added sugars, or sodium. Maintain a healthy weight Body mass index (BMI) is used to identify weight problems. It estimates body fat based on height and weight. Your health care provider can help determine your BMI and help you achieve or maintain a healthy weight. Get regular exercise Get regular  exercise. This is one of the most important things you can do for your health. Most adults should:  Exercise for at least 150 minutes each week. The exercise should increase your heart rate and make you sweat (moderate-intensity exercise).  Do strengthening exercises at least twice a week. This is in addition to the moderate-intensity exercise.  Spend less time sitting. Even light physical activity can be beneficial. Watch cholesterol and blood lipids Have your blood tested for lipids and cholesterol at 66 years of age, then have this test every 5 years. Have your cholesterol levels checked more often if:  Your lipid or cholesterol levels are high.  You are older than 66 years of age.  You are at high risk for heart disease. What should I know about cancer screening? Depending on your health history and family history, you may need to have cancer screening at various ages. This may include screening for:  Breast cancer.  Cervical cancer.  Colorectal cancer.  Skin cancer.  Lung cancer. What should I know about heart disease, diabetes, and high blood pressure? Blood pressure and heart disease  High blood pressure causes heart disease and increases the risk of stroke. This is more likely to develop in people who have high blood pressure readings, are of African descent, or are overweight.  Have your blood pressure checked: ? Every 3-5 years if you are 30-110 years of age. ? Every year if you are 6 years old or older. Diabetes Have regular diabetes screenings. This checks your fasting blood sugar level. Have the screening done:  Once every three years after age 67 if you are at a normal weight and have a low risk for diabetes.  More often and at a younger age if you are overweight or have a high risk for diabetes. What should I know about preventing infection? Hepatitis B If you have a higher risk for hepatitis B, you should be screened for this virus. Talk with your health  care provider to find out if you are at risk for hepatitis B infection. Hepatitis C Testing is recommended for:  Everyone born from [birth year] through 1965.  Anyone with known risk factors for hepatitis C. Sexually transmitted infections (STIs)  Get screened for STIs, including gonorrhea and chlamydia, if: ? You are sexually active and are younger than 66 years of age. ? You are older than 66 years of age and your health care provider tells you that you are at risk for this type of infection. ? Your sexual activity has changed since you were last screened, and you are at increased risk for chlamydia or gonorrhea. Ask your health care provider if you are at risk.  Ask your health care provider about whether you are at high risk for HIV. Your health care provider may recommend a prescription medicine to help prevent HIV infection. If you choose to take medicine to prevent HIV, you should first get tested for HIV. You should then be tested every 3 months for as long as you  are taking the medicine. Pregnancy  If you are about to stop having your period (premenopausal) and you may become pregnant, seek counseling before you get pregnant.  Take 400 to 800 micrograms (mcg) of folic acid every day if you become pregnant.  Ask for birth control (contraception) if you want to prevent pregnancy. Osteoporosis and menopause Osteoporosis is a disease in which the bones lose minerals and strength with aging. This can result in bone fractures. If you are 17 years old or older, or if you are at risk for osteoporosis and fractures, ask your health care provider if you should:  Be screened for bone loss.  Take a calcium or vitamin D supplement to lower your risk of fractures.  Be given hormone replacement therapy (HRT) to treat symptoms of menopause. Follow these instructions at home: Lifestyle  Do not use any products that contain nicotine or tobacco, such as cigarettes, e-cigarettes, and chewing tobacco.  If you need help quitting, ask your health care provider.  Do not use street drugs.  Do not share needles.  Ask your health care provider for help if you need support or information about quitting drugs. Alcohol use  Do not drink alcohol if: ? Your health care provider tells you not to drink. ? You are pregnant, may be pregnant, or are planning to become pregnant.  If you drink alcohol: ? Limit how much you use to 0-1 drink a day. ? Limit intake if you are breastfeeding.  Be aware of how much alcohol is in your drink. In the [place] one drink equals one 12 oz bottle of beer (355 mL), one 5 oz glass of wine (148 mL), or one 1 oz glass of hard liquor (44 mL). General instructions  Schedule regular health, dental, and eye exams.  Stay current with your vaccines.  Tell your health care provider if: ? You often feel depressed. ? You have ever been abused or do not feel safe at home. Summary  Adopting a healthy lifestyle and getting preventive care are important in promoting health and wellness.  Follow your health care provider's instructions about healthy diet, exercising, and getting tested or screened for diseases.  Follow your health care provider's instructions on monitoring your cholesterol and blood pressure. This information is not intended to replace advice given to you by your health care provider. Make sure you discuss any questions you have with your health care provider. Document Released: 07/06/2010 Document Revised: 12/14/2017 Document Reviewed: 12/14/2017 Elsevier Patient Education  [place].   Lab Results  Component Value Date   WBC 4.9 02/07/2018   HGB 14.8 02/07/2018   HCT 42.8 02/07/2018   PLT 372.0 02/07/2018   GLUCOSE 96 07/26/2018   CHOL 187 07/26/2018   TRIG 92.0 07/26/2018   HDL 47.60 07/26/2018   LDLDIRECT 158.4 08/14/2012   LDLCALC 121 (H) 07/26/2018   ALT 17 02/07/2018   AST 15 02/07/2018   NA 141 07/26/2018   K 3.7 07/26/2018    CL 104 07/26/2018   CREATININE 0.80 07/26/2018   BUN 16 07/26/2018   CO2 26 07/26/2018   TSH 2.40 02/07/2018   HGBA1C 5.4 07/26/2018      Charlot Gouin K. Arieh Bogue M.D.

## 2018-08-01 NOTE — Patient Instructions (Addendum)
Get mammo and dexa scan  At breast cneter Will send labs results  to cardiology      Health Maintenance, Female Adopting a healthy lifestyle and getting preventive care are important in promoting health and wellness. Ask your health care provider about:  The right schedule for you to have regular tests and exams.  Things you can do on your own to prevent diseases and keep yourself healthy. What should I know about diet, weight, and exercise? Eat a healthy diet   Eat a diet that includes plenty of vegetables, fruits, low-fat dairy products, and lean protein.  Do not eat a lot of foods that are high in solid fats, added sugars, or sodium. Maintain a healthy weight Body mass index (BMI) is used to identify weight problems. It estimates body fat based on height and weight. Your health care provider can help determine your BMI and help you achieve or maintain a healthy weight. Get regular exercise Get regular exercise. This is one of the most important things you can do for your health. Most adults should:  Exercise for at least 150 minutes each week. The exercise should increase your heart rate and make you sweat (moderate-intensity exercise).  Do strengthening exercises at least twice a week. This is in addition to the moderate-intensity exercise.  Spend less time sitting. Even light physical activity can be beneficial. Watch cholesterol and blood lipids Have your blood tested for lipids and cholesterol at 66 years of age, then have this test every 5 years. Have your cholesterol levels checked more often if:  Your lipid or cholesterol levels are high.  You are older than 66 years of age.  You are at high risk for heart disease. What should I know about cancer screening? Depending on your health history and family history, you may need to have cancer screening at various ages. This may include screening for:  Breast cancer.  Cervical cancer.  Colorectal cancer.  Skin  cancer.  Lung cancer. What should I know about heart disease, diabetes, and high blood pressure? Blood pressure and heart disease  High blood pressure causes heart disease and increases the risk of stroke. This is more likely to develop in people who have high blood pressure readings, are of African descent, or are overweight.  Have your blood pressure checked: ? Every 3-5 years if you are 55-73 years of age. ? Every year if you are 71 years old or older. Diabetes Have regular diabetes screenings. This checks your fasting blood sugar level. Have the screening done:  Once every three years after age 35 if you are at a normal weight and have a low risk for diabetes.  More often and at a younger age if you are overweight or have a high risk for diabetes. What should I know about preventing infection? Hepatitis B If you have a higher risk for hepatitis B, you should be screened for this virus. Talk with your health care provider to find out if you are at risk for hepatitis B infection. Hepatitis C Testing is recommended for:  Everyone born from [birth year] through 1965.  Anyone with known risk factors for hepatitis C. Sexually transmitted infections (STIs)  Get screened for STIs, including gonorrhea and chlamydia, if: ? You are sexually active and are younger than 66 years of age. ? You are older than 66 years of age and your health care provider tells you that you are at risk for this type of infection. ? Your sexual activity has changed  since you were last screened, and you are at increased risk for chlamydia or gonorrhea. Ask your health care provider if you are at risk.  Ask your health care provider about whether you are at high risk for HIV. Your health care provider may recommend a prescription medicine to help prevent HIV infection. If you choose to take medicine to prevent HIV, you should first get tested for HIV. You should then be tested every 3 months for as long as you are taking  the medicine. Pregnancy  If you are about to stop having your period (premenopausal) and you may become pregnant, seek counseling before you get pregnant.  Take 400 to 800 micrograms (mcg) of folic acid every day if you become pregnant.  Ask for birth control (contraception) if you want to prevent pregnancy. Osteoporosis and menopause Osteoporosis is a disease in which the bones lose minerals and strength with aging. This can result in bone fractures. If you are 81 years old or older, or if you are at risk for osteoporosis and fractures, ask your health care provider if you should:  Be screened for bone loss.  Take a calcium or vitamin D supplement to lower your risk of fractures.  Be given hormone replacement therapy (HRT) to treat symptoms of menopause. Follow these instructions at home: Lifestyle  Do not use any products that contain nicotine or tobacco, such as cigarettes, e-cigarettes, and chewing tobacco. If you need help quitting, ask your health care provider.  Do not use street drugs.  Do not share needles.  Ask your health care provider for help if you need support or information about quitting drugs. Alcohol use  Do not drink alcohol if: ? Your health care provider tells you not to drink. ? You are pregnant, may be pregnant, or are planning to become pregnant.  If you drink alcohol: ? Limit how much you use to 0-1 drink a day. ? Limit intake if you are breastfeeding.  Be aware of how much alcohol is in your drink. In the [place] one drink equals one 12 oz bottle of beer (355 mL), one 5 oz glass of wine (148 mL), or one 1 oz glass of hard liquor (44 mL). General instructions  Schedule regular health, dental, and eye exams.  Stay current with your vaccines.  Tell your health care provider if: ? You often feel depressed. ? You have ever been abused or do not feel safe at home. Summary  Adopting a healthy lifestyle and getting preventive care are important in  promoting health and wellness.  Follow your health care provider's instructions about healthy diet, exercising, and getting tested or screened for diseases.  Follow your health care provider's instructions on monitoring your cholesterol and blood pressure. This information is not intended to replace advice given to you by your health care provider. Make sure you discuss any questions you have with your health care provider. Document Released: 07/06/2010 Document Revised: 12/14/2017 Document Reviewed: 12/14/2017 Elsevier Patient Education  [place].   Lab Results  Component Value Date   WBC 4.9 02/07/2018   HGB 14.8 02/07/2018   HCT 42.8 02/07/2018   PLT 372.0 02/07/2018   GLUCOSE 96 07/26/2018   CHOL 187 07/26/2018   TRIG 92.0 07/26/2018   HDL 47.60 07/26/2018   LDLDIRECT 158.4 08/14/2012   LDLCALC 121 (H) 07/26/2018   ALT 17 02/07/2018   AST 15 02/07/2018   NA 141 07/26/2018   K 3.7 07/26/2018   CL 104 07/26/2018  CREATININE 0.80 07/26/2018   BUN 16 07/26/2018   CO2 26 07/26/2018   TSH 2.40 02/07/2018   HGBA1C 5.4 07/26/2018

## 2018-08-02 ENCOUNTER — Other Ambulatory Visit (HOSPITAL_COMMUNITY)
Admission: RE | Admit: 2018-08-02 | Discharge: 2018-08-02 | Disposition: A | Discharge status: Home / Self Care | Payer: BC Managed Care – PPO | Source: Ambulatory Visit | Attending: Internal Medicine | Admitting: Internal Medicine

## 2018-08-02 ENCOUNTER — Encounter: Payer: Self-pay | Admitting: Internal Medicine

## 2018-08-02 ENCOUNTER — Other Ambulatory Visit: Payer: Self-pay | Admitting: Internal Medicine

## 2018-08-02 ENCOUNTER — Other Ambulatory Visit: Payer: Self-pay

## 2018-08-02 ENCOUNTER — Ambulatory Visit (INDEPENDENT_AMBULATORY_CARE_PROVIDER_SITE_OTHER): Payer: BC Managed Care – PPO | Admitting: Internal Medicine

## 2018-08-02 VITALS — BP 128/70 | HR 85 | Temp 98.4°F | Ht 64.0 in | Wt 171.2 lb

## 2018-08-02 DIAGNOSIS — Z780 Asymptomatic menopausal state: Secondary | ICD-10-CM | POA: Insufficient documentation

## 2018-08-02 DIAGNOSIS — Z1231 Encounter for screening mammogram for malignant neoplasm of breast: Secondary | ICD-10-CM

## 2018-08-02 DIAGNOSIS — E039 Hypothyroidism, unspecified: Secondary | ICD-10-CM

## 2018-08-02 DIAGNOSIS — Z23 Encounter for immunization: Secondary | ICD-10-CM

## 2018-08-02 DIAGNOSIS — Z124 Encounter for screening for malignant neoplasm of cervix: Secondary | ICD-10-CM | POA: Insufficient documentation

## 2018-08-02 DIAGNOSIS — Z0000 Encounter for general adult medical examination without abnormal findings: Secondary | ICD-10-CM | POA: Diagnosis not present

## 2018-08-02 DIAGNOSIS — Z79899 Other long term (current) drug therapy: Secondary | ICD-10-CM

## 2018-08-02 DIAGNOSIS — E785 Hyperlipidemia, unspecified: Secondary | ICD-10-CM

## 2018-08-02 DIAGNOSIS — E2839 Other primary ovarian failure: Secondary | ICD-10-CM

## 2018-08-02 DIAGNOSIS — R7301 Impaired fasting glucose: Secondary | ICD-10-CM | POA: Diagnosis not present

## 2018-08-02 DIAGNOSIS — I10 Essential (primary) hypertension: Secondary | ICD-10-CM | POA: Diagnosis not present

## 2018-08-02 DIAGNOSIS — R0602 Shortness of breath: Secondary | ICD-10-CM

## 2018-08-02 DIAGNOSIS — Z1151 Encounter for screening for human papillomavirus (HPV): Secondary | ICD-10-CM | POA: Diagnosis not present

## 2018-08-02 MED ORDER — LOSARTAN POTASSIUM 25 MG PO TABS: 25.0000 mg | ORAL_TABLET | Freq: Every day | ORAL | 1 refills | Status: DC

## 2018-08-04 LAB — CYTOLOGY - PAP
Diagnosis: NEGATIVE
HPV: NOT DETECTED

## 2018-08-20 ENCOUNTER — Other Ambulatory Visit: Payer: Self-pay | Admitting: Internal Medicine

## 2018-10-16 ENCOUNTER — Ambulatory Visit: Payer: BC Managed Care – PPO

## 2018-10-16 ENCOUNTER — Other Ambulatory Visit: Payer: BC Managed Care – PPO

## 2018-11-22 ENCOUNTER — Ambulatory Visit
Admission: RE | Admit: 2018-11-22 | Discharge: 2018-11-22 | Disposition: A | Discharge status: Home / Self Care | Payer: Medicare Other | Source: Ambulatory Visit | Attending: Internal Medicine | Admitting: Internal Medicine

## 2018-11-22 ENCOUNTER — Other Ambulatory Visit: Payer: Self-pay

## 2018-11-22 ENCOUNTER — Other Ambulatory Visit: Payer: BC Managed Care – PPO

## 2018-11-22 DIAGNOSIS — Z1231 Encounter for screening mammogram for malignant neoplasm of breast: Secondary | ICD-10-CM

## 2019-01-20 ENCOUNTER — Other Ambulatory Visit: Payer: Self-pay | Admitting: Adult Health

## 2019-01-23 ENCOUNTER — Other Ambulatory Visit: Payer: Self-pay | Admitting: Internal Medicine

## 2019-01-23 NOTE — Telephone Encounter (Signed)
Rx(s) sent to pharmacy electronically.  

## 2019-01-26 ENCOUNTER — Ambulatory Visit: Payer: Medicare Other | Attending: Internal Medicine

## 2019-01-26 DIAGNOSIS — Z23 Encounter for immunization: Secondary | ICD-10-CM | POA: Insufficient documentation

## 2019-01-26 NOTE — Progress Notes (Signed)
   Covid-19 Vaccination Clinic  Name:  Jillian Escobar    MRN: [id number] DOB: 12-27-1950  01/26/2019  [name] was observed post Covid-19 immunization for 30 minutes based on pre-vaccination screening without incidence. She was provided with Vaccine Information Sheet and instruction to access the V-Safe system.   [name] was instructed to call 911 with any severe reactions post vaccine: [name] Difficulty breathing  . Swelling of your face and throat  . A fast heartbeat  . A bad rash all over your body  . Dizziness and weakness    Immunizations Administered    Name Date Dose VIS Date Route   Pfizer COVID-19 Vaccine 01/26/2019  1:16 PM 0.3 mL 12/15/2018 Intramuscular   Manufacturer: [place]   Lot: [id number]   [place]: [id number]

## 2019-02-05 ENCOUNTER — Other Ambulatory Visit: Payer: Self-pay

## 2019-02-05 ENCOUNTER — Encounter: Payer: Self-pay | Admitting: Internal Medicine

## 2019-02-05 ENCOUNTER — Ambulatory Visit (INDEPENDENT_AMBULATORY_CARE_PROVIDER_SITE_OTHER): Payer: Medicare Other

## 2019-02-05 ENCOUNTER — Ambulatory Visit (INDEPENDENT_AMBULATORY_CARE_PROVIDER_SITE_OTHER): Payer: Medicare Other | Admitting: Internal Medicine

## 2019-02-05 VITALS — BP 120/62 | HR 77 | Temp 97.9°F | Wt 171.6 lb

## 2019-02-05 DIAGNOSIS — R12 Heartburn: Secondary | ICD-10-CM | POA: Diagnosis not present

## 2019-02-05 DIAGNOSIS — Z634 Disappearance and death of family member: Secondary | ICD-10-CM

## 2019-02-05 DIAGNOSIS — R079 Chest pain, unspecified: Secondary | ICD-10-CM

## 2019-02-05 NOTE — Patient Instructions (Addendum)
This doesn't sound like a cardiovascular problem.   Possible chest wall pain and  Also acid reflux coming back   As in past. Your exam is reassuring .  And oxygen level is good.  Chest x ray  Is normal .    Take pepcid twice a day  60 minutes from the thyroid med     If needed can add on Nexium  Or Prilosec  If not better in 2  Weeks let  us know .  For fu .  Stay hydrated and  Precautions to avoid covid infection when traveling.

## 2019-02-05 NOTE — Progress Notes (Signed)
This visit occurred during the SARS-CoV-2 public health emergency.  Safety protocols were in place, including screening questions prior to the visit, additional usage of staff PPE, and extensive cleaning of exam room while observing appropriate contact time as indicated for disinfecting solutions.    Chief Complaint  Patient presents with  . Breast Pain    right breast pain radiating up to shoulder at time pt believes its heartburn pt states that it burns even after drinking water     HPI: Jillian Escobar 69 y.o. come in for new problem   Right upper chest pain and then Horrible  Heartburn and belching and took pepcid  Once or  Twice a day .  With help  Onset  This week   No injury but was moving some of moms stuff   Getting ready to fly out of town.   Was on nexium in past   More eating and drinking   More alcohol   Mom passed last week age 58  alzhiemers .  Every night several . Ate well and lots  Fried . Foods  Sleeping a lot  But copoing with stress  No sob exerciwse intoleracne but not  Active now    BP has been ok  ROS: See pertinent positives and negatives per HPI. No fever vomiting  Diarrhea blood  Sob .  Continuous weekend  Worse with deep breath .   Localized  Right  Mid chest   Better with pepcid sometimes .   Past Medical History:  Diagnosis Date  . Allergy   . Arthritis   . GERD (gastroesophageal reflux disease)   . Hx of varicella   . Hyperlipidemia    borderline  . Hypothyroidism   . Pneumonia 11/2014   Only time took inhalers to treat  . Shingles 10/26/2010  . Syncope and collapse    Unclear etiology   . Thyroid disease     Family History  Problem Relation Age of Onset  . Heart disease Mother        stent in 61 s  . COPD Father   . Heart disease Father        cabg in 40s   . Glaucoma Father   . Dementia Father   . Ulcerative colitis Sister   . Ulcerative colitis Brother   . Arrhythmia Daughter   . Colon cancer Cousin   . Colon polyps Neg Hx   .  Esophageal cancer Neg Hx   . Rectal cancer Neg Hx   . Stomach cancer Neg Hx       Outpatient Medications Prior to Visit  Medication Sig Dispense Refill  . albuterol (VENTOLIN HFA) 108 (90 Base) MCG/ACT inhaler TAKE 2 PUFFS BY MOUTH EVERY 6 HOURS AS NEEDED FOR WHEEZE OR SHORTNESS OF BREATH 1 g 0  . Cholecalciferol (VITAMIN D PO) Take 2 tablets by mouth daily.    . DUEXIS 800-26.6 MG TABS Take 1 tablet by mouth daily as needed.  2  . hydrochlorothiazide (MICROZIDE) 12.5 MG capsule TAKE 1 CAPSULE BY MOUTH EVERY DAY 90 capsule 2  . levothyroxine (SYNTHROID) 50 MCG tablet TAKE 1 TABLET BY MOUTH EVERY DAY 90 tablet 1  . losartan (COZAAR) 25 MG tablet TAKE 2 TABLETS (50 MG TOTAL) BY MOUTH DAILY. FOR HIGH BLOOD PRESSURE 180 tablet 1  . Misc Natural Products (OSTEO BI-FLEX/5-LOXIN ADVANCED PO) Take by mouth 2 (two) times daily at 8 am and 10 pm.    . Multiple Vitamin (MULTIVITAMIN WITH  MINERALS) TABS tablet Take 1 tablet by mouth daily.    [name] OVER THE COUNTER MEDICATION Place 1 Dose under the tongue 2 (two) times daily. CBD oil    . vitamin A 3 MG (10000 UNITS) capsule Take 10,000 Units by mouth daily.     No facility-administered medications prior to visit.     EXAM:  BP 120/62 (BP Location: Right Arm, Patient Position: Sitting, Cuff Size: Normal)   Pulse 77   Temp 97.9 F (36.6 C) (Temporal)   Wt 171 lb 9.6 oz (77.8 kg)   SpO2 97%   BMI 29.46 kg/m   Body mass index is 29.46 kg/m.  GENERAL: vitals reviewed and listed above, alert, oriented, appears well hydrated and in no acute distress HEENT: atraumatic, conjunctiva  clear, no obvious abnormalities on inspection of external nose and ears OP :masked  NECK: no obvious masses on inspection palpation  LUNGS: clear to auscultation bilaterally, no wheezes, rales or rhonchi, good air movement  Localized are of tenderness right cc junction area  T3?  No crepitus  No masses noted  CV: HRRR, no clubbing cyanosis or  peripheral edema nl cap  refill  Abdomen:  Sof,t normal bowel sounds without hepatosplenomegaly, no guarding rebound or masses no CVA tenderness MS: moves all extremities without noticeable focal  abnormality PSYCH: pleasant and cooperative, no obvious depression or anxiety Lab Results  Component Value Date   WBC 4.9 02/07/2018   HGB 14.8 02/07/2018   HCT 42.8 02/07/2018   PLT 372.0 02/07/2018   GLUCOSE 96 07/26/2018   CHOL 187 07/26/2018   TRIG 92.0 07/26/2018   HDL 47.60 07/26/2018   LDLDIRECT 158.4 08/14/2012   LDLCALC 121 (H) 07/26/2018   ALT 17 02/07/2018   AST 15 02/07/2018   NA 141 07/26/2018   K 3.7 07/26/2018   CL 104 07/26/2018   CREATININE 0.80 07/26/2018   BUN 16 07/26/2018   CO2 26 07/26/2018   TSH 2.40 02/07/2018   HGBA1C 5.4 07/26/2018   BP Readings from Last 3 Encounters:  02/05/19 120/62  08/02/18 128/70  06/27/18 109/69    ASSESSMENT AND PLAN:  Discussed the following assessment and plan:  Right-sided chest pain - Plan: DG Chest 2 View, DG Chest 2 View  Heart burn - Plan: DG Chest 2 View, DG Chest 2 View  Death of parent c xray  NAD Record  Review   Past hx of gerd sx   Sx do nos sound cardiac in  Nature  covid  Prevention she has  Had for first injection of vaccine  Plan fu status check on my chart or call in about 2 weeks and ir not better reasses  attention to dietary factors  -Patient advised to return or notify health care team  if  new concerns arise. Return for status report in about 2 weeks .  Outside external source  DATA REVIEWED:   Total time on date  of service including record review ordering and plan of care:  Counseling  32 minutes    Patient Instructions  This doesn't sound like a cardiovascular problem.   Possible chest wall pain and  Also acid reflux coming back   As in past. Your exam is reassuring .  And oxygen level is good.  Chest x ray  Is normal .    Take pepcid twice a day  60 minutes from the thyroid med     If needed can add on Nexium   Or Prilosec  If not  better in 2  Weeks let  us know .  For fu .  Stay hydrated and  Precautions to avoid covid infection when traveling.     Standley Brooking. Marshe Shrestha M.D.

## 2019-02-16 ENCOUNTER — Ambulatory Visit: Payer: Medicare Other | Attending: Internal Medicine

## 2019-02-16 DIAGNOSIS — Z23 Encounter for immunization: Secondary | ICD-10-CM

## 2019-02-16 NOTE — Progress Notes (Signed)
   Covid-19 Vaccination Clinic  Name:  ZAIDY ZINNO    MRN: [id number] DOB: 05-12-1950  02/16/2019  Ms. Bartz was observed post Covid-19 immunization for 15 minutes without incidence. She was provided with Vaccine Information Sheet and instruction to access the V-Safe system.   Ms. Mcnealy was instructed to call 911 with any severe reactions post vaccine: [name] Difficulty breathing  . Swelling of your face and throat  . A fast heartbeat  . A bad rash all over your body  . Dizziness and weakness    Immunizations Administered    Name Date Dose VIS Date Route   Pfizer COVID-19 Vaccine 02/16/2019 12:38 PM 0.3 mL 12/15/2018 Intramuscular   Manufacturer: [place]   Lot: [id number]   [place]: [id number]

## 2019-02-19 ENCOUNTER — Other Ambulatory Visit: Payer: Self-pay

## 2019-02-19 ENCOUNTER — Ambulatory Visit
Admission: RE | Admit: 2019-02-19 | Discharge: 2019-02-19 | Disposition: A | Discharge status: Home / Self Care | Payer: Medicare Other | Source: Ambulatory Visit | Attending: Internal Medicine | Admitting: Internal Medicine

## 2019-02-19 DIAGNOSIS — E2839 Other primary ovarian failure: Secondary | ICD-10-CM

## 2019-02-19 NOTE — Progress Notes (Signed)
Bone density shows osteopenia   can repeat in  3-4 years to make sure no progression

## 2019-02-20 ENCOUNTER — Encounter: Payer: Self-pay | Admitting: *Deleted

## 2019-02-20 NOTE — Progress Notes (Signed)
Take 1000 IU per day vit d and equivalent of 1200 mg calcium per day ( not all at once  calcium dairly foods counts  in the total ...  600 mg of calcium max at a time to be helpful

## 2019-04-02 ENCOUNTER — Telehealth: Payer: Self-pay | Admitting: Cardiology

## 2019-04-02 NOTE — Telephone Encounter (Signed)
Patient c/o Palpitations:  High priority if patient c/o lightheadedness, shortness of breath, or chest pain  1) How long have you had palpitations/irregular HR/ Afib? Are you having the symptoms now? Started yesterday morning, not having symptoms now.  2) Are you currently experiencing lightheadedness, SOB or CP? No   3) Do you have a history of afib (atrial fibrillation) or irregular heart rhythm? Doesn't remember   4) Have you checked your BP or HR? (document readings if available): 123/75 HR 72 (this morning)  5) Are you experiencing any other symptoms? No    [name] is calling to report her recent palpitations. She states she noticed them starting yesterday morning 04/01/19 and also experienced them again this morning. She states she is not having palpitations now and no other symptoms occur when they are present. An appt has been scheduled in regards to this for 04/04/19 with Almyra Deforest. Please advise.

## 2019-04-02 NOTE — Telephone Encounter (Signed)
Contacted patient, she states they did strenous yard work on Saturday and yesterday morning she woke up feeling the palpitations more than normal, and then this morning. She states once she gets moving they do go away or she doesn't notice them. She states she is having no other symptoms- no chest pain, SOB, swelling, dizziness. She was made appointment on 03/31- I did advise patient to take it easy the next few days, monitor if she has them again and what she was doing when she has the palpitations, continue to monitor BP and HR, and to increase fluid intake.  Patient verbalized understanding, she is going out of town next week and wants to get this taken care of.  Patient thankful for the call.

## 2019-04-04 ENCOUNTER — Encounter: Payer: Self-pay | Admitting: Physician Assistant

## 2019-04-04 ENCOUNTER — Encounter: Payer: Self-pay | Admitting: *Deleted

## 2019-04-04 ENCOUNTER — Other Ambulatory Visit: Payer: Self-pay

## 2019-04-04 ENCOUNTER — Ambulatory Visit (INDEPENDENT_AMBULATORY_CARE_PROVIDER_SITE_OTHER): Payer: Medicare Other | Admitting: Physician Assistant

## 2019-04-04 VITALS — BP 134/80 | HR 69 | Ht 63.0 in | Wt 174.4 lb

## 2019-04-04 DIAGNOSIS — R002 Palpitations: Secondary | ICD-10-CM

## 2019-04-04 DIAGNOSIS — I351 Nonrheumatic aortic (valve) insufficiency: Secondary | ICD-10-CM | POA: Diagnosis not present

## 2019-04-04 DIAGNOSIS — E039 Hypothyroidism, unspecified: Secondary | ICD-10-CM | POA: Diagnosis not present

## 2019-04-04 DIAGNOSIS — I10 Essential (primary) hypertension: Secondary | ICD-10-CM

## 2019-04-04 DIAGNOSIS — E785 Hyperlipidemia, unspecified: Secondary | ICD-10-CM

## 2019-04-04 MED ORDER — LOSARTAN POTASSIUM 25 MG PO TABS: 25.0000 mg | ORAL_TABLET | Freq: Every day | ORAL | 3 refills | Status: DC

## 2019-04-04 NOTE — Progress Notes (Signed)
Patient ID: Jillian Escobar, female   DOB: [birth year], 69 y.o.   MRN: [id number] Patient enrolled for [employer] to mail a 14 day ZIO XT long term holter monitor to her home.

## 2019-04-04 NOTE — Patient Instructions (Addendum)
Medication Instructions:  Your physician recommends that you continue on your current medications as directed. Please refer to the Current Medication list given to you today.  *If you need a refill on your cardiac medications before your next appointment, please call your pharmacy*  Lab Work: Your physician recommends that you return for lab work TODAY:  BMET If you have labs (blood work) drawn today and your tests are completely normal, you will receive your results only by: [name] MyChart Message (if you have MyChart) OR . A paper copy in the mail If you have any lab test that is abnormal or we need to change your treatment, we will call you to review the results.  Testing/Procedures:  [facility]- Long Term Monitor Instructions   Your physician has requested you wear your ZIO patch monitor 14 days.   This is a single patch monitor.  [employer] supplies one patch monitor per enrollment.  Additional stickers are not available.   Please do not apply patch if you will be having a Nuclear Stress Test, Echocardiogram, Cardiac CT, MRI, or Chest Xray during the time frame you would be wearing the monitor. The patch cannot be worn during these tests.  You cannot remove and re-apply the ZIO XT patch monitor.   Your ZIO patch monitor will be sent USPS Priority mail from [facility] directly to your home address. The monitor may also be mailed to a PO BOX if home delivery is not available.   It may take 3-5 days to receive your monitor after you have been enrolled.   Once you have received you monitor, please review enclosed instructions.  Your monitor has already been registered assigning a specific monitor serial # to you.   Applying the monitor   Shave hair from upper left chest.   Hold abrader disc by orange tab.  Rub abrader in 40 strokes over left upper chest as indicated in your monitor instructions.   Clean area with 4 enclosed alcohol pads .  Use all pads to assure are is cleaned  thoroughly.  Let dry.   Apply patch as indicated in monitor instructions.  Patch will be place under collarbone on left side of chest with arrow pointing upward.   Rub patch adhesive wings for 2 minutes.Remove white label marked "1".  Remove white label marked "2".  Rub patch adhesive wings for 2 additional minutes.   While looking in a mirror, press and release button in center of patch.  A small green light will flash 3-4 times .  This will be your only indicator the monitor has been turned on.     Do not shower for the first 24 hours.  You may shower after the first 24 hours.   Press button if you feel a symptom. You will hear a small click.  Record Date, Time and Symptom in the Patient Log Book.   When you are ready to remove patch, follow instructions on last 2 pages of Patient Log Book.  Stick patch monitor onto last page of Patient Log Book.   Place Patient Log Book in [place] box.  Use locking tab on box and tape box closed securely.  The [employer] has [employer] on it.  Please place in mailbox as soon as possible.  Your physician should have your test results approximately 7 days after the monitor has been mailed back to [facility].   Call [place] at [phone] if you have questions regarding your ZIO  XT patch monitor.  Call them immediately if you see an orange light blinking on your monitor.   If your monitor falls off in less than 4 days contact our Monitor department at [phone].  If your monitor becomes loose or falls off after 4 days call [employer] at [phone] for suggestions on securing your monitor.   Follow-Up: At [facility], you and your health needs are our priority.  As part of our continuing mission to provide you with exceptional heart care, we have created designated Provider Care Teams.  These Care Teams include your primary Cardiologist (physician) and Advanced Practice Providers (APPs -  Physician Assistants and  Nurse Practitioners) who all work together to provide you with the care you need, when you need it.  Your next appointment:   4-5 week(s)  The format for your next appointment:   Either In Person or Virtual  Provider:   Glenetta Hew, MD or Almyra Deforest, PA-C  Other Instructions  Limit your alcohol intake: 1-2 glasses or less for the week of vacation   Limit and/or decrease your caffeine (coffee, tea or chocolate) intake

## 2019-04-04 NOTE — Progress Notes (Signed)
Cardiology Office Note:    Date:  04/06/2019   ID:  Jillian Escobar, DOB 1950/03/24, MRN [id number]  PCP:  Burnis Medin, MD  Cardiologist:  Glenetta Hew, MD  Electrophysiologist:  None   Referring MD: Burnis Medin, MD   No chief complaint on file.   History of Present Illness:    Jillian Escobar is a 69 y.o. female with a hx of hypertension, mild to moderate AI, hyperlipidemia, hypothyroidism, tobacco abuse, and history of presyncope.  She has family history of premature CAD.  Previous Myoview obtained on 01/14/2015 showed EF 71%, normal myocardial effusion, she was able to achieve 9.5 METS of activity.  Echocardiogram obtained on 11/07/2017 showed EF 60 to 65%, grade 2 DD, mild to moderate AI.  She was previously evaluated for chest pain and dyspnea on exertion.  Her chest pain improved however she continued to have exertional dyspnea which was felt to be related to deconditioning.  To further evaluate her exertional dyspnea, Dr. Ellyn Hack recommend a cardiopulmonary stress test in June 2020.  It does not appears this was ever done, likely due to restrictions during COVID-19 pandemic.  Patient recently had a lot of licorice candy. Last Saturday, she drank a lot of alcohol.  Started on Sunday, she had frequent palpitations which she described as a dropped heartbeat.  I suspect she was having symptomatic PVCs or PACs.  The symptom was not sustained, although suspicion for atrial flutter or atrial fibrillation is fairly low, however there is still a possibility.  I recommended 2-week heart monitor.  Given her underlying right bundle branch block and left anterior fascicular block, she would not be a good candidate to place rate control therapy on due to concern of worsening conduction disease.  I suspect if we can manage her diet, we can control the palpitation more.  Unfortunately she is going to [facility] this weekend and has plan to visit a brewery and have plenty of alcohol.  I asked her to  minimize alcohol intake to at most 1-2 glass this weekend.  She will return next Tuesday night.  I will ask our monitor staff to send her to the heart monitor by next Wednesday.  Otherwise she can follow-up in 4 to 5 weeks.  I also plan to obtain TSH and basic metabolic panel to make sure she does not have any electrolyte imbalance or taking too much thyroid medication.  She did mention she took 2 extra doses of potassium in fear of low potassium reduced palpitation.  The last time she had palpitation was yesterday, palpitation has resolved by this point.   Past Medical History:  Diagnosis Date  . Allergy   . Arthritis   . GERD (gastroesophageal reflux disease)   . Hx of varicella   . Hyperlipidemia    borderline  . Hypothyroidism   . Pneumonia 11/2014   Only time took inhalers to treat  . Shingles 10/26/2010  . Syncope and collapse    Unclear etiology   . Thyroid disease     Past Surgical History:  Procedure Laterality Date  . CESAREAN SECTION     x1  . CHOLECYSTECTOMY    . COLONOSCOPY    . ESOPHAGOGASTRODUODENOSCOPY (EGD) WITH PROPOFOL N/A 12/09/2015   Procedure: ESOPHAGOGASTRODUODENOSCOPY (EGD) WITH PROPOFOL;  Surgeon: Manus Gunning, MD;  Location: [facility];  Service: Gastroenterology;  Laterality: N/A;  . EXPLORATORY LAPAROTOMY     Gallbladder  . POLYPECTOMY N/A 12/09/2015   Procedure: POLYPECTOMY;  Surgeon:  Manus Gunning, MD;  Location: [facility];  Service: Gastroenterology;  Laterality: N/A;  . TUBAL LIGATION    . WISDOM TOOTH EXTRACTION      Current Medications: Current Meds  Medication Sig  . albuterol (VENTOLIN HFA) 108 (90 Base) MCG/ACT inhaler TAKE 2 PUFFS BY MOUTH EVERY 6 HOURS AS NEEDED FOR WHEEZE OR SHORTNESS OF BREATH  . calcium carbonate (CALCIUM 600) 600 MG TABS tablet   . Cholecalciferol (VITAMIN D PO) Take 2 tablets by mouth daily.  . DUEXIS 800-26.6 MG TABS Take 1 tablet by mouth daily as needed.  [name] ELDERBERRY PO   . famotidine  (PEPCID) 20 MG tablet Take 20 mg by mouth as needed for heartburn or indigestion.  . hydrochlorothiazide (MICROZIDE) 12.5 MG capsule TAKE 1 CAPSULE BY MOUTH EVERY DAY  . levothyroxine (SYNTHROID) 50 MCG tablet TAKE 1 TABLET BY MOUTH EVERY DAY  . losartan (COZAAR) 25 MG tablet Take 1 tablet (25 mg total) by mouth daily.  . Misc Natural Products (OSTEO BI-FLEX/5-LOXIN ADVANCED PO) Take by mouth 2 (two) times daily at 8 am and 10 pm.  . Multiple Vitamin (MULTIVITAMIN WITH MINERALS) TABS tablet Take 1 tablet by mouth daily.  [name] OVER THE COUNTER MEDICATION Place 1 Dose under the tongue 2 (two) times daily. CBD oil  . vitamin A 3 MG (10000 UNITS) capsule Take 10,000 Units by mouth daily.  . [DISCONTINUED] losartan (COZAAR) 25 MG tablet TAKE 2 TABLETS (50 MG TOTAL) BY MOUTH DAILY. FOR HIGH BLOOD PRESSURE (Patient taking differently: Take 25 mg by mouth daily. )     Allergies:   Eggs or egg-derived products and Levaquin [levofloxacin]   Social History   Socioeconomic History  . Marital status: Married    Spouse name: Not on file  . Number of children: 1  . Years of education: Not on file  . Highest education level: Not on file  Occupational History  . Not on file  Tobacco Use  . Smoking status: Former Smoker    Quit date: 01/04/1982    Years since quitting: 37.2  . Smokeless tobacco: Never Used  Substance and Sexual Activity  . Alcohol use: Yes    Alcohol/week: 0.0 standard drinks    Comment: daily- 2 a night   . Drug use: No  . Sexual activity: Not on file  Other Topics Concern  . Not on file  Social History Narrative   H H of 2    Married, G2P1   Former tobacco;drinks beer wine   Walks maybe 30 minutes or more days a week. Doesn't do more than that because she is "lazy"   Pet 2 dog and 2 cats .   TA Ruby pre K and K   Originally from [place], moved over 20 years ago   Social Determinants of Radio broadcast assistant Strain:   . Difficulty of Paying  Living Expenses:   Food Insecurity:   . Worried About Charity fundraiser in the Last Year:   . Arboriculturist in the Last Year:   Transportation Needs:   . Film/video editor (Medical):   [name] Lack of Transportation (Non-Medical):   Physical Activity:   . Days of Exercise per Week:   . Minutes of Exercise per Session:   Stress:   . Feeling of Stress :   Social Connections:   . Frequency of Communication with Friends and Family:   . Frequency of Social Gatherings with Friends and  Family:   . Attends Religious Services:   . Active Member of Clubs or Organizations:   . Attends Archivist Meetings:   [name] Marital Status:      Family History: The patient's family history includes Arrhythmia in her daughter; COPD in her father; Colon cancer in her cousin; Dementia in her father; Glaucoma in her father; Heart disease in her father and mother; Ulcerative colitis in her brother and sister. There is no history of Colon polyps, Esophageal cancer, Rectal cancer, or Stomach cancer.  ROS:   Please see the history of present illness.     All other systems reviewed and are negative.  EKGs/Labs/Other Studies Reviewed:    The following studies were reviewed today:  Echo 11/07/2017 LV EF: 60% -  65%   -------------------------------------------------------------------  Indications:   R01.1 Murmur.   -------------------------------------------------------------------  History:  PMH: Acquired from the patient and from the patient&'s  chart. PMH: Hypothyroidism. Pneumonia. Syncope and collapse.  Risk factors: Dyslipidemia.   -------------------------------------------------------------------  Study Conclusions   - Left ventricle: The cavity size was normal. There was mild  concentric hypertrophy. Systolic function was normal. The  estimated ejection fraction was in the range of 60% to 65%. Wall  motion was normal; there were no regional wall motion    abnormalities. Features are consistent with a pseudonormal left  ventricular filling pattern, with concomitant abnormal relaxation  and increased filling pressure (grade 2 diastolic dysfunction).  Doppler parameters are consistent with elevated ventricular  end-diastolic filling pressure.  - Aortic valve: There was mild to moderate regurgitation directed  centrally in the LVOT.   EKG:  EKG is ordered today.  The ekg ordered today demonstrates normal sinus rhythm, right bundle branch block, left anterior fascicular block  Recent Labs: 04/04/2019: BUN 15; Creatinine, Ser 0.94; Potassium 4.1; Sodium 139  Recent Lipid Panel    Component Value Date/Time   CHOL 187 07/26/2018 0803   TRIG 92.0 07/26/2018 0803   HDL 47.60 07/26/2018 0803   CHOLHDL 4 07/26/2018 0803   VLDL 18.4 07/26/2018 0803   LDLCALC 121 (H) 07/26/2018 0803   LDLDIRECT 158.4 08/14/2012 0828    Physical Exam:    VS:  BP 134/80   Pulse 69   Ht 5\' 3"  (1.6 m)   Wt 174 lb 6.4 oz (79.1 kg)   SpO2 99%   BMI 30.89 kg/m     Wt Readings from Last 3 Encounters:  04/04/19 174 lb 6.4 oz (79.1 kg)  02/05/19 171 lb 9.6 oz (77.8 kg)  08/02/18 171 lb 3.2 oz (77.7 kg)     GEN:  Well nourished, well developed in no acute distress HEENT: Normal NECK: No JVD; No carotid bruits LYMPHATICS: No lymphadenopathy CARDIAC: RRR, no murmurs, rubs, gallops RESPIRATORY:  Clear to auscultation without rales, wheezing or rhonchi  ABDOMEN: Soft, non-tender, non-distended MUSCULOSKELETAL:  No edema; No deformity  SKIN: Warm and dry NEUROLOGIC:  Alert and oriented x 3 PSYCHIATRIC:  Normal affect   ASSESSMENT:    1. Palpitations   2. Nonrheumatic aortic valve insufficiency   3. Essential hypertension   4. Hypothyroidism, unspecified type   5. Hyperlipidemia LDL goal <100    PLAN:    In order of problems listed above:  1. Palpitation: This occurred after she had plenty of licorice candy and alcohol.  I suspect the  symptoms she is experiencing is symptomatic PVCs.  I plan to obtain a 2-week heart monitor.  Unfortunately, I do not think I can add  any AV nodal blocking agent given her underlying conduction disease in the form of right bundle branch block and left anterior fascicular block.  Addition of AV nodal agent can potentially worsen her conduction disease.  However I suspect her symptoms can be controlled by limiting caffeinated drinks and certain food.  2. History of aortic valve insufficiency: Mild to moderate aortic regurgitation noted on previous echocardiogram in November 2019.  3. Hypertension: Blood pressure stable.  Obtain basic metabolic panel  4. Hypothyroidism: Managed by primary care provider  5. Hyperlipidemia: Diet controlled.  Most recent lipid panel obtained in July 2020 showed a total cholesterol 187, HDL 47.6.  LDL 121.  Triglyceride 92.  Will need to continue diet and exercise to hopefully lower LDL to less than 100.   Medication Adjustments/Labs and Tests Ordered: Current medicines are reviewed at length with the patient today.  Concerns regarding medicines are outlined above.  Orders Placed This Encounter  Procedures  . Basic metabolic panel  . LONG TERM MONITOR (3-14 DAYS)  . EKG 12-Lead   Meds ordered this encounter  Medications  . losartan (COZAAR) 25 MG tablet    Sig: Take 1 tablet (25 mg total) by mouth daily.    Dispense:  90 tablet    Refill:  3    Patient Instructions  Medication Instructions:  Your physician recommends that you continue on your current medications as directed. Please refer to the Current Medication list given to you today.  *If you need a refill on your cardiac medications before your next appointment, please call your pharmacy*  Lab Work: Your physician recommends that you return for lab work TODAY:  BMET If you have labs (blood work) drawn today and your tests are completely normal, you will receive your results only by: [name] MyChart  Message (if you have MyChart) OR . A paper copy in the mail If you have any lab test that is abnormal or we need to change your treatment, we will call you to review the results.  Testing/Procedures:  [facility]- Long Term Monitor Instructions   Your physician has requested you wear your ZIO patch monitor 14 days.   This is a single patch monitor.  [employer] supplies one patch monitor per enrollment.  Additional stickers are not available.   Please do not apply patch if you will be having a Nuclear Stress Test, Echocardiogram, Cardiac CT, MRI, or Chest Xray during the time frame you would be wearing the monitor. The patch cannot be worn during these tests.  You cannot remove and re-apply the ZIO XT patch monitor.   Your ZIO patch monitor will be sent USPS Priority mail from [facility] directly to your home address. The monitor may also be mailed to a PO BOX if home delivery is not available.   It may take 3-5 days to receive your monitor after you have been enrolled.   Once you have received you monitor, please review enclosed instructions.  Your monitor has already been registered assigning a specific monitor serial # to you.   Applying the monitor   Shave hair from upper left chest.   Hold abrader disc by orange tab.  Rub abrader in 40 strokes over left upper chest as indicated in your monitor instructions.   Clean area with 4 enclosed alcohol pads .  Use all pads to assure are is cleaned thoroughly.  Let dry.   Apply patch as indicated in monitor instructions.  Patch will be place under collarbone on left  side of chest with arrow pointing upward.   Rub patch adhesive wings for 2 minutes.Remove white label marked "1".  Remove white label marked "2".  Rub patch adhesive wings for 2 additional minutes.   While looking in a mirror, press and release button in center of patch.  A small green light will flash 3-4 times .  This will be your only indicator the monitor has been turned  on.     Do not shower for the first 24 hours.  You may shower after the first 24 hours.   Press button if you feel a symptom. You will hear a small click.  Record Date, Time and Symptom in the Patient Log Book.   When you are ready to remove patch, follow instructions on last 2 pages of Patient Log Book.  Stick patch monitor onto last page of Patient Log Book.   Place Patient Log Book in [place] box.  Use locking tab on box and tape box closed securely.  The [employer] and [employer] has [employer] on it.  Please place in mailbox as soon as possible.  Your physician should have your test results approximately 7 days after the monitor has been mailed back to [facility].   Call [place] at [phone] if you have questions regarding your ZIO XT patch monitor.  Call them immediately if you see an orange light blinking on your monitor.   If your monitor falls off in less than 4 days contact our Monitor department at [phone].  If your monitor becomes loose or falls off after 4 days call [employer] at [phone] for suggestions on securing your monitor.   Follow-Up: At [facility], you and your health needs are our priority.  As part of our continuing mission to provide you with exceptional heart care, we have created designated Provider Care Teams.  These Care Teams include your primary Cardiologist (physician) and Advanced Practice Providers (APPs -  Physician Assistants and Nurse Practitioners) who all work together to provide you with the care you need, when you need it.  Your next appointment:   4-5 week(s)  The format for your next appointment:   Either In Person or Virtual  Provider:   Glenetta Hew, MD or Almyra Deforest, PA-C  Other Instructions  Limit your alcohol intake: 1-2 glasses or less for the week of vacation   Limit and/or decrease your caffeine (coffee, tea or chocolate) intake    Signed, Almyra Deforest, [place]  04/06/2019 11:11 PM    [place]

## 2019-04-05 LAB — BASIC METABOLIC PANEL
BUN/Creatinine Ratio: 16 (ref 12–28)
BUN: 15 mg/dL (ref 8–27)
CO2: 22 mmol/L (ref 20–29)
Calcium: 10.2 mg/dL (ref 8.7–10.3)
Chloride: 101 mmol/L (ref 96–106)
Creatinine, Ser: 0.94 mg/dL (ref 0.57–1.00)
GFR calc Af Amer: 73 mL/min/{1.73_m2} (ref >59)
GFR calc non Af Amer: 63 mL/min/{1.73_m2} (ref >59)
Glucose: 87 mg/dL (ref 65–99)
Potassium: 4.1 mmol/L (ref 3.5–5.2)
Sodium: 139 mmol/L (ref 134–144)

## 2019-04-05 NOTE — Progress Notes (Signed)
Letter out with comments.

## 2019-04-05 NOTE — Progress Notes (Signed)
Renal function and electrolyte normal. Potassium normal

## 2019-04-11 ENCOUNTER — Other Ambulatory Visit (INDEPENDENT_AMBULATORY_CARE_PROVIDER_SITE_OTHER): Payer: Medicare Other

## 2019-04-11 DIAGNOSIS — R002 Palpitations: Secondary | ICD-10-CM | POA: Diagnosis not present

## 2019-04-16 ENCOUNTER — Telehealth: Payer: Self-pay | Admitting: Physician Assistant

## 2019-04-16 NOTE — Telephone Encounter (Signed)
Message route to [employer] about holter monitor

## 2019-04-16 NOTE — Telephone Encounter (Signed)
Jillian Escobar is calling stating it itches for her to wear her holter monitor. She stated it itches both under the tape and under the monitor itself. Jillian Escobar is about to go on a walk, but she states if she is unable to answer when calling back it is okay to leave a message with the husband or on her VM.

## 2019-04-18 NOTE — Telephone Encounter (Signed)
Patient applied hydrocortisone cream to outer edges of ZIO patch and states she is having much less itching and irritation.  She has worn her monitor a week at this point.  Patient informed that if her itching and irritation comes back and is not relieved with the hydrocortisone cream to remove and mail the monitor back to [employer] to process recordings up to that time.

## 2019-05-08 ENCOUNTER — Telehealth: Payer: Self-pay | Admitting: *Deleted

## 2019-05-08 NOTE — Telephone Encounter (Signed)
called - no answer - left message  Changed 5/5/ appointment to virtual . If patient would like a in office visit call to reschedule Otherwise will talk to patient tomorrow morning.

## 2019-05-09 ENCOUNTER — Telehealth: Payer: Self-pay | Admitting: *Deleted

## 2019-05-09 ENCOUNTER — Encounter: Payer: Self-pay | Admitting: Cardiology

## 2019-05-09 ENCOUNTER — Telehealth (INDEPENDENT_AMBULATORY_CARE_PROVIDER_SITE_OTHER): Payer: Medicare Other | Admitting: Cardiology

## 2019-05-09 DIAGNOSIS — R0602 Shortness of breath: Secondary | ICD-10-CM | POA: Diagnosis not present

## 2019-05-09 DIAGNOSIS — Z8249 Family history of ischemic heart disease and other diseases of the circulatory system: Secondary | ICD-10-CM | POA: Diagnosis not present

## 2019-05-09 DIAGNOSIS — R000 Tachycardia, unspecified: Secondary | ICD-10-CM

## 2019-05-09 DIAGNOSIS — R55 Syncope and collapse: Secondary | ICD-10-CM

## 2019-05-09 DIAGNOSIS — I10 Essential (primary) hypertension: Secondary | ICD-10-CM

## 2019-05-09 NOTE — Progress Notes (Signed)
Will review on followup with Dr. Ellyn Hack today, suspect her recent symptom is more PVCs and PACs, no significant prolonged rhythm noted.

## 2019-05-09 NOTE — Progress Notes (Signed)
Virtual Visit via Telephone Note   This visit type was conducted due to national recommendations for restrictions regarding the COVID-19 Pandemic (e.g. social distancing) in an effort to limit this patient's exposure and mitigate transmission in our community.  Due to her co-morbid illnesses, this patient is at least at moderate risk for complications without adequate follow up.  This format is felt to be most appropriate for this patient at this time.  The patient did not have access to video technology/had technical difficulties with video requiring transitioning to audio format only (telephone).  All issues noted in this document were discussed and addressed.  No physical exam could be performed with this format.  Please refer to the patient's chart for her  consent to telehealth for [facility].   Patient has given verbal permission to conduct this visit via virtual appointment and to bill insurance 05/14/2019 1:26 AM     Evaluation Performed:  Follow-up visit  Date:  05/14/2019   ID:  Jillian Escobar, DOB Aug 31, 1950, MRN [id number]  Patient Location: Home Provider Location: Home  PCP:  Burnis Medin, MD  Cardiologist:  Glenetta Hew, MD  Electrophysiologist:  None   Chief Complaint:   Chief Complaint  Patient presents with  . Follow-up    Monitor results  . Palpitations    Notably improved.    History of Present Illness:    Jillian Escobar is a 69 y.o. female with PMH notable for hypertension and mild to moderate aortic insufficiency who presents via audio/video conferencing for a telehealth visit today as a follow-up from recent visit notable for palpitations.Hoyle Barr was just seen on March 31 by Almyra Deforest, PA: "Patient recently had a lot of licorice candy. Last Saturday, she drank a lot of alcohol.  Started on Sunday, she had frequent palpitations which she described as a dropped heartbeat.  I suspect she was having symptomatic PVCs or PACs.  Not sustained."  ->  2-week monitor ordered.   --> No rate control agent started because of bifascicular block. --> Recommended minimizing alcohol intake Lab Results  Component Value Date   TSH 2.40 02/07/2018   Hospitalizations:  . None  Recent - Interim CV studies:    The following studies were reviewed today: 10-day Event Monitor: Predominantly sinus rhythm with rate ranging from 46-131 bpm. Average heart rate 73 bpm. Normal monitor - pt did not have any symptoms while wearing monitor.  Rhythm was 100% sinus with intermittent bundle branch block.   Very Rare isolated PACs and PVCs noted. No couplets or triplets noted. No bigeminy.  No arrhythmias-tachycardia or bradycardia.  No pauses    Inerval History   Jillian Escobar is being evaluated today in follow-up stating that she is doing very well now.  She really had rare palpitations and did not any of your bad symptoms while wearing the monitor. She has made a concerted effort to cut back the caffeine intake and sweets.  She does now note that she probably was not hydrating very well.  She still did some drinking of alcohol during her recent trip out to [facility] and notes that with better hydration she did not have as much of increased heart rate and shortness of breath on walking in the airport.  She said that she still gets that short of breath but much less than had before.  She is able to walk all over [place] and the other locations where she went to visit.  Overall,  the fast heart rate spells seem to have notably improved and she is paying a lot better attention to increasing her hydration.  She is down about a cup of coffee which is one half caffeine one half noncaffeinated.  She is also cut down her alcohol and sweets.  She is not having any chest pain or pressure with exertion.  She has some dyspnea exertion but is admittedly deconditioned.  With all the walking she has done over the last month actually she feels less dyspneic.   Cardiovascular ROS: positive for - Improved exertional dyspnea and palpitations negative for - chest pain, edema, irregular heartbeat, paroxysmal nocturnal dyspnea, shortness of breath or Syncope/near syncope TIA/amaurosis fugax, claudication   ROS:  Please see the history of present illness.    The patient does not have symptoms concerning for COVID-19 infection (fever, chills, cough, or new shortness of breath).  Review of Systems  Constitutional: Positive for weight loss (Cutting back on sweets and alcohol and try to eat healthier). Negative for malaise/fatigue (Surprisingly did much better on her last trip).  HENT: Negative for congestion and nosebleeds.   Respiratory: Negative for shortness of breath.   Gastrointestinal: Negative for blood in stool and melena.  Genitourinary: Negative for hematuria.  Musculoskeletal: Negative for back pain.  Neurological: Negative for dizziness.  Psychiatric/Behavioral: The patient is not nervous/anxious (Less anxious now).     The patient is practicing social distancing.  ++ She has had her Covid vaccines  Past Medical History:  Diagnosis Date  . Allergy   . Arthritis   . GERD (gastroesophageal reflux disease)   . Hx of varicella   . Hyperlipidemia    borderline  . Hypothyroidism   . Pneumonia 11/2014   Only time took inhalers to treat  . Shingles 10/26/2010  . Syncope and collapse    Unclear etiology   . Thyroid disease    Past Surgical History:  Procedure Laterality Date  . CESAREAN SECTION     x1  . CHOLECYSTECTOMY    . COLONOSCOPY    . ESOPHAGOGASTRODUODENOSCOPY (EGD) WITH PROPOFOL N/A 12/09/2015   Procedure: ESOPHAGOGASTRODUODENOSCOPY (EGD) WITH PROPOFOL;  Surgeon: Manus Gunning, MD;  Location: [facility];  Service: Gastroenterology;  Laterality: N/A;  . EXPLORATORY LAPAROTOMY     Gallbladder  . POLYPECTOMY N/A 12/09/2015   Procedure: POLYPECTOMY;  Surgeon: Manus Gunning, MD;  Location: [facility];   Service: Gastroenterology;  Laterality: N/A;  . TUBAL LIGATION    . WISDOM TOOTH EXTRACTION       Current Meds  Medication Sig  . albuterol (VENTOLIN HFA) 108 (90 Base) MCG/ACT inhaler TAKE 2 PUFFS BY MOUTH EVERY 6 HOURS AS NEEDED FOR WHEEZE OR SHORTNESS OF BREATH  . calcium carbonate (CALCIUM 600) 600 MG TABS tablet   . Cholecalciferol (VITAMIN D PO) Take 2 tablets by mouth daily.  . DUEXIS 800-26.6 MG TABS Take 1 tablet by mouth daily as needed.  [name] ELDERBERRY PO daily.   . famotidine (PEPCID) 20 MG tablet Take 20 mg by mouth as needed for heartburn or indigestion.  . hydrochlorothiazide (MICROZIDE) 12.5 MG capsule TAKE 1 CAPSULE BY MOUTH EVERY DAY  . levothyroxine (SYNTHROID) 50 MCG tablet TAKE 1 TABLET BY MOUTH EVERY DAY  . losartan (COZAAR) 25 MG tablet Take 1 tablet (25 mg total) by mouth daily.  . Misc Natural Products (OSTEO BI-FLEX/5-LOXIN ADVANCED PO) Take by mouth 2 (two) times daily at 8 am and 10 pm.  . Multiple Vitamin (MULTIVITAMIN WITH MINERALS)  TABS tablet Take 1 tablet by mouth daily.  [name] OVER THE COUNTER MEDICATION Place 1 Dose under the tongue daily. CBD oil   . vitamin A 3 MG (10000 UNITS) capsule Take 10,000 Units by mouth daily.     Allergies:   Levaquin [levofloxacin]   Social History   Tobacco Use  . Smoking status: Former Smoker    Quit date: 01/04/1982    Years since quitting: 37.3  . Smokeless tobacco: Never Used  Substance Use Topics  . Alcohol use: Yes    Alcohol/week: 0.0 standard drinks    Comment: daily- 2 a night   . Drug use: No     Family Hx: The patient's family history includes Arrhythmia in her daughter; COPD in her father; Colon cancer in her cousin; Dementia in her father; Glaucoma in her father; Heart disease in her father and mother; Ulcerative colitis in her brother and sister. There is no history of Colon polyps, Esophageal cancer, Rectal cancer, or Stomach cancer.   Labs/Other Tests and Data Reviewed:    EKG:  No ECG reviewed.   Recent Labs: 04/04/2019: BUN 15; Creatinine, Ser 0.94; Potassium 4.1; Sodium 139   Recent Lipid Panel Lab Results  Component Value Date/Time   CHOL 187 07/26/2018 08:03 AM   TRIG 92.0 07/26/2018 08:03 AM   HDL 47.60 07/26/2018 08:03 AM   CHOLHDL 4 07/26/2018 08:03 AM   LDLCALC 121 (H) 07/26/2018 08:03 AM   LDLDIRECT 158.4 08/14/2012 08:28 AM    Wt Readings from Last 3 Encounters:  05/09/19 165 lb (74.8 kg)  04/04/19 174 lb 6.4 oz (79.1 kg)  02/05/19 171 lb 9.6 oz (77.8 kg)     Objective:    Vital Signs:  BP 134/78   Pulse 72   Ht 5\' 3"  (1.6 m)   Wt 165 lb (74.8 kg)   BMI 29.23 kg/m   VITAL SIGNS:  reviewed Pleasant - No acute distress. A&O x 3.  Normal Mood & Affect Non-labored respirations   ASSESSMENT & PLAN:    Problem List Items Addressed This Visit    Syncope (Chronic)    Thankfully, no further episodes of syncope.  I suspect based on the (have these recent tachycardia spells are associated dehydration probably the episode was related to dehydration and heat.  Thankfully, no further episodes.      Family history of premature CAD (Chronic)    Last LDL was 121.  Should target LDL less than 100.  She is not currently on statin.  We did not discuss this.  I deferred to PCP.  At her follow-up visit, we can discuss potentially considering a coronary calcium score, but recent CT scans in the last couple years have not suggested significant coronary calcification.      Essential hypertension (Chronic)    Blood pressure looks pretty good.  I have asked that she keep an eye on it.  She is on HCTZ and losartan.  I tried to increase to 50 mg, but is back down to 25.  She will keep an eye on her blood pressures and if they do go up, we probably would need to increase either one of the 2 medicines.  I would probably err on the side of increasing losartan and not the HCTZ to avoid dehydration and palpitations.      Exertional shortness of breath    Actually seems to be  doing fairly well.  I think the more she gets in shape and loses weight, this is  improving.  We discussed possibly doing a CPX wants to cover restrictions are less strict so that she would be able do the test without wearing a mask.      Rapid heart rate    Rapid heart rates palpitation symptoms probably related to dehydration, alcohol and even possibly licorice.  Monitor did not show any significant PVCs or PACs.  No arrhythmias.  Symptoms seem to have improved with adjustment of diet, avoiding triggers and increasing hydration. Continue to recommend adequate hydration, and minimized sweets, caffeine, chocolate, licorice etc.          COVID-19 Education: The signs and symptoms of COVID-19 were discussed with the patient and how to seek care for testing (follow up with PCP or arrange E-visit).   The importance of social distancing was discussed today.  Time:   Today, I have spent 14 minutes with the patient with telehealth technology discussing the above problems.  5 minutes spent charting   Medication Adjustments/Labs and Tests Ordered: Current medicines are reviewed at length with the patient today.  Concerns regarding medicines are outlined above.   Patient Instructions  Medication Instructions:   No new changes for now  *If you need a refill on your cardiac medications before your next appointment, please call your pharmacy*   Lab Work: N/A.  Your thyroid level looks fine   Testing/Procedures: None   Follow-Up: At [facility], you and your health needs are our priority.  As part of our continuing mission to provide you with exceptional heart care, we have created designated Provider Care Teams.  These Care Teams include your primary Cardiologist (physician) and Advanced Practice Providers (APPs -  Physician Assistants and Nurse Practitioners) who all work together to provide you with the care you need, when you need it.     Your next appointment:   5 month(s)   The format for your next appointment:   In Person  Provider:   Glenetta Hew, MD   Other Instructions Make sure you stay adequately hydrated like we discussed.  Remember sweets, alcohol, licorice all in moderation     Signed, Glenetta Hew, MD  05/14/2019 1:26 AM    [place]

## 2019-05-09 NOTE — Telephone Encounter (Signed)
RN spoke to patient. Instruction were given  from today's virtual visit  05/09/19 .  AVS SUMMARY has been sent by mychart .   Patient verbalized understanding

## 2019-05-09 NOTE — Telephone Encounter (Signed)
  Patient Consent for Virtual Visit         Jillian Escobar has provided verbal consent on 05/09/2019 for a virtual visit (video or telephone).   CONSENT FOR VIRTUAL VISIT FOR:  Jillian Escobar  By participating in this virtual visit I agree to the following:  I hereby voluntarily request, consent and authorize [facility] and its employed or contracted physicians, physician assistants, nurse practitioners or other licensed health care professionals (the Practitioner), to provide me with telemedicine health care services (the "Services") as deemed necessary by the treating Practitioner. I acknowledge and consent to receive the Services by the Practitioner via telemedicine. I understand that the telemedicine visit will involve communicating with the Practitioner through live audiovisual communication technology and the disclosure of certain medical information by electronic transmission. I acknowledge that I have been given the opportunity to request an in-person assessment or other available alternative prior to the telemedicine visit and am voluntarily participating in the telemedicine visit.  I understand that I have the right to withhold or withdraw my consent to the use of telemedicine in the course of my care at any time, without affecting my right to future care or treatment, and that the Practitioner or I may terminate the telemedicine visit at any time. I understand that I have the right to inspect all information obtained and/or recorded in the course of the telemedicine visit and may receive copies of available information for a reasonable fee.  I understand that some of the potential risks of receiving the Services via telemedicine include:  [name] Delay or interruption in medical evaluation due to technological equipment failure or disruption; . Information transmitted may not be sufficient (e.g. poor resolution of images) to allow for appropriate medical decision making by the  Practitioner; and/or  . In rare instances, security protocols could fail, causing a breach of personal health information.  Furthermore, I acknowledge that it is my responsibility to provide information about my medical history, conditions and care that is complete and accurate to the best of my ability. I acknowledge that Practitioner's advice, recommendations, and/or decision may be based on factors not within their control, such as incomplete or inaccurate data provided by me or distortions of diagnostic images or specimens that may result from electronic transmissions. I understand that the practice of medicine is not an exact science and that Practitioner makes no warranties or guarantees regarding treatment outcomes. I acknowledge that a copy of this consent can be made available to me via my patient portal ([facility]), or I can request a printed copy by calling the office of [facility].    I understand that my insurance will be billed for this visit.   I have read or had this consent read to me. . I understand the contents of this consent, which adequately explains the benefits and risks of the Services being provided via telemedicine.  . I have been provided ample opportunity to ask questions regarding this consent and the Services and have had my questions answered to my satisfaction. . I give my informed consent for the services to be provided through the use of telemedicine in my medical care

## 2019-05-09 NOTE — Patient Instructions (Addendum)
Medication Instructions:   No new changes for now  *If you need a refill on your cardiac medications before your next appointment, please call your pharmacy*   Lab Work: N/A.  Your thyroid level looks fine   Testing/Procedures: None   Follow-Up: At [facility], you and your health needs are our priority.  As part of our continuing mission to provide you with exceptional heart care, we have created designated Provider Care Teams.  These Care Teams include your primary Cardiologist (physician) and Advanced Practice Providers (APPs -  Physician Assistants and Nurse Practitioners) who all work together to provide you with the care you need, when you need it.     Your next appointment:   5 month(s)  The format for your next appointment:   In Person  Provider:   Glenetta Hew, MD   Other Instructions Make sure you stay adequately hydrated like we discussed.  Remember sweets, alcohol, licorice all in moderation

## 2019-05-14 ENCOUNTER — Encounter: Payer: Self-pay | Admitting: Cardiology

## 2019-05-14 DIAGNOSIS — R000 Tachycardia, unspecified: Secondary | ICD-10-CM | POA: Insufficient documentation

## 2019-05-14 DIAGNOSIS — I493 Ventricular premature depolarization: Secondary | ICD-10-CM | POA: Insufficient documentation

## 2019-05-14 NOTE — Assessment & Plan Note (Signed)
Rapid heart rates palpitation symptoms probably related to dehydration, alcohol and even possibly licorice.  Monitor did not show any significant PVCs or PACs.  No arrhythmias.  Symptoms seem to have improved with adjustment of diet, avoiding triggers and increasing hydration. Continue to recommend adequate hydration, and minimized sweets, caffeine, chocolate, licorice etc.

## 2019-05-14 NOTE — Assessment & Plan Note (Signed)
Blood pressure looks pretty good.  I have asked that she keep an eye on it.  She is on HCTZ and losartan.  I tried to increase to 50 mg, but is back down to 25.  She will keep an eye on her blood pressures and if they do go up, we probably would need to increase either one of the 2 medicines.  I would probably err on the side of increasing losartan and not the HCTZ to avoid dehydration and palpitations.

## 2019-05-14 NOTE — Assessment & Plan Note (Signed)
Actually seems to be doing fairly well.  I think the more she gets in shape and loses weight, this is improving.  We discussed possibly doing a CPX wants to cover restrictions are less strict so that she would be able do the test without wearing a mask.

## 2019-05-14 NOTE — Assessment & Plan Note (Addendum)
Last LDL was 121.  Should target LDL less than 100.  She is not currently on statin.  We did not discuss this.  I deferred to PCP.  At her follow-up visit, we can discuss potentially considering a coronary calcium score, but recent CT scans in the last couple years have not suggested significant coronary calcification.

## 2019-05-14 NOTE — Assessment & Plan Note (Signed)
Thankfully, no further episodes of syncope.  I suspect based on the (have these recent tachycardia spells are associated dehydration probably the episode was related to dehydration and heat.  Thankfully, no further episodes.

## 2019-07-18 ENCOUNTER — Other Ambulatory Visit: Payer: Self-pay | Admitting: Internal Medicine

## 2019-08-11 ENCOUNTER — Other Ambulatory Visit: Payer: Self-pay | Admitting: Internal Medicine

## 2019-08-20 NOTE — Progress Notes (Signed)
Chief Complaint  Patient presents with  . Medication Management    Doing okay  . Hypothyroidism  . Hyperlipidemia  . Hypertension    HPI: Jillian Escobar 69 y.o. come in for Chronic disease management  yearly   Cards advised statin   But doing ok and due for labs  Thyroid  Taking daily .  No se doing ok  Bp controlled  recent travel to [place] and did well.   Has had covid vaccine ROS: See pertinent positives and negatives per HPI. No cp sob  Syncope new sx   Past Medical History:  Diagnosis Date  . Allergy   . Arthritis   . GERD (gastroesophageal reflux disease)   . Hx of varicella   . Hyperlipidemia    borderline  . Hypothyroidism   . Pneumonia 11/2014   Only time took inhalers to treat  . Shingles 10/26/2010  . Syncope and collapse    Unclear etiology   . Thyroid disease     Family History  Problem Relation Age of Onset  . Heart disease Mother        stent in 90 s  . COPD Father   . Heart disease Father        cabg in 16s   . Glaucoma Father   . Dementia Father   . Ulcerative colitis Sister   . Ulcerative colitis Brother   . Arrhythmia Daughter   . Colon cancer Cousin   . Colon polyps Neg Hx   . Esophageal cancer Neg Hx   . Rectal cancer Neg Hx   . Stomach cancer Neg Hx     Social History   Socioeconomic History  . Marital status: Married    Spouse name: Not on file  . Number of children: 1  . Years of education: Not on file  . Highest education level: Not on file  Occupational History  . Not on file  Tobacco Use  . Smoking status: Former Smoker    Quit date: 01/04/1982    Years since quitting: 37.6  . Smokeless tobacco: Never Used  Vaping Use  . Vaping Use: Former  Substance and Sexual Activity  . Alcohol use: Yes    Alcohol/week: 0.0 standard drinks    Comment: daily- 2 a night   . Drug use: No  . Sexual activity: Not on file  Other Topics Concern  . Not on file  Social History Narrative   H H of 2    Married, G2P1   Former  tobacco;drinks beer wine   Walks maybe 30 minutes or more days a week. Doesn't do more than that because she is "lazy"   Pet 2 dog and 2 cats .   TA [place] [employer]   Originally from [place], moved over 20 years ago   Social Determinants of Radio broadcast assistant Strain:   . Difficulty of Paying Living Expenses:   Food Insecurity:   . Worried About Charity fundraiser in the Last Year:   . Arboriculturist in the Last Year:   Transportation Needs:   . Film/video editor (Medical):   [name] Lack of Transportation (Non-Medical):   Physical Activity:   . Days of Exercise per Week:   . Minutes of Exercise per Session:   Stress:   . Feeling of Stress :   Social Connections:   . Frequency of Communication with Friends and Family:   . Frequency  of Social Gatherings with Friends and Family:   . Attends Religious Services:   . Active Member of Clubs or Organizations:   . Attends Archivist Meetings:   [name] Marital Status:     Outpatient Medications Prior to Visit  Medication Sig Dispense Refill  . albuterol (VENTOLIN HFA) 108 (90 Base) MCG/ACT inhaler TAKE 2 PUFFS BY MOUTH EVERY 6 HOURS AS NEEDED FOR WHEEZE OR SHORTNESS OF BREATH 1 g 0  . calcium carbonate (CALCIUM 600) 600 MG TABS tablet     . Cholecalciferol (VITAMIN D PO) Take 2 tablets by mouth daily.    . DUEXIS 800-26.6 MG TABS Take 1 tablet by mouth daily as needed.  2  . ELDERBERRY PO daily.     . famotidine (PEPCID) 20 MG tablet Take 20 mg by mouth as needed for heartburn or indigestion.    . hydrochlorothiazide (MICROZIDE) 12.5 MG capsule TAKE 1 CAPSULE BY MOUTH EVERY DAY 90 capsule 2  . levothyroxine (SYNTHROID) 50 MCG tablet Take 1 tablet (50 mcg total) by mouth daily. Please schedule appointment with labs for refills. [phone] 30 tablet 0  . losartan (COZAAR) 25 MG tablet Take 1 tablet (25 mg total) by mouth daily. 90 tablet 3  . Misc Natural Products (OSTEO BI-FLEX/5-LOXIN ADVANCED  PO) Take by mouth 2 (two) times daily at 8 am and 10 pm.    . Multiple Vitamin (MULTIVITAMIN WITH MINERALS) TABS tablet Take 1 tablet by mouth daily.    [name] OVER THE COUNTER MEDICATION Place 1 Dose under the tongue daily. CBD oil     . vitamin A 3 MG (10000 UNITS) capsule Take 10,000 Units by mouth daily.     No facility-administered medications prior to visit.     EXAM:  BP 136/76   Pulse 75   Temp 98.1 F (36.7 C) (Oral)   Ht 5\' 3"  (1.6 m)   Wt 175 lb 3.2 oz (79.5 kg)   SpO2 96%   BMI 31.04 kg/m   Body mass index is 31.04 kg/m.  GENERAL: vitals reviewed and listed above, alert, oriented, appears well hydrated and in no acute distress HEENT: atraumatic, conjunctiva  clear, no obvious abnormalities on inspection of external nose and ears OP : masked  NECK: no obvious masses on inspection palpation  LUNGS: clear to auscultation bilaterally, no wheezes, rales or rhonchi, good air movement CV: HRRR, no clubbing cyanosis or  peripheral edema nl cap refill  MS: moves all extremities without noticeable focal  abnormality PSYCH: pleasant and cooperative, no obvious depression or anxiety Lab Results  Component Value Date   WBC 4.9 02/07/2018   HGB 14.8 02/07/2018   HCT 42.8 02/07/2018   PLT 372.0 02/07/2018   GLUCOSE 87 04/04/2019   CHOL 187 07/26/2018   TRIG 92.0 07/26/2018   HDL 47.60 07/26/2018   LDLDIRECT 158.4 08/14/2012   LDLCALC 121 (H) 07/26/2018   ALT 17 02/07/2018   AST 15 02/07/2018   NA 139 04/04/2019   K 4.1 04/04/2019   CL 101 04/04/2019   CREATININE 0.94 04/04/2019   BUN 15 04/04/2019   CO2 22 04/04/2019   TSH 2.40 02/07/2018   HGBA1C 5.4 07/26/2018   BP Readings from Last 3 Encounters:  08/21/19 136/76  05/09/19 134/78  04/04/19 134/80    ASSESSMENT AND PLAN:  Discussed the following assessment and plan:  Hypothyroidism, unspecified type - med monitoring  - Plan: Lipid panel, CBC with Differential/Platelet, TSH, Hepatic function panel, Hepatic  function panel, TSH, CBC  with Differential/Platelet, Lipid panel  Hyperlipidemia, unspecified hyperlipidemia type - Plan: Lipid panel, CBC with Differential/Platelet, TSH, Hepatic function panel, Hepatic function panel, TSH, CBC with Differential/Platelet, Lipid panel  Essential hypertension - Plan: Lipid panel, CBC with Differential/Platelet, TSH, Hepatic function panel, Hepatic function panel, TSH, CBC with Differential/Platelet, Lipid panel  Medication management - Plan: Lipid panel, CBC with Differential/Platelet, TSH, Hepatic function panel, Hepatic function panel, TSH, CBC with Differential/Platelet, Lipid panel  Need for pneumococcal vaccination - Plan: Pneumococcal polysaccharide vaccine 23-valent greater than or equal to 2yo subcutaneous/IM Disc  Med statin  Risk benefit get levels  Levels and   Will get results to  Cards also .   -Patient advised to return or notify health care team  if  new concerns arise.  Patient Instructions  Glad you are doing well.  Will notify you  of labs when available. And will get info to your cardiologist  consideration if helpful to use a statin medication for  Primary prevention.  Make sure colon cancer screening  Is UTD.      Standley Brooking. Leo Weyandt M.D.

## 2019-08-21 ENCOUNTER — Encounter: Payer: Self-pay | Admitting: Internal Medicine

## 2019-08-21 ENCOUNTER — Ambulatory Visit (INDEPENDENT_AMBULATORY_CARE_PROVIDER_SITE_OTHER): Payer: Medicare Other | Admitting: Internal Medicine

## 2019-08-21 ENCOUNTER — Encounter: Payer: Self-pay | Admitting: Gastroenterology

## 2019-08-21 ENCOUNTER — Other Ambulatory Visit: Payer: Self-pay

## 2019-08-21 VITALS — BP 136/76 | HR 75 | Temp 98.1°F | Ht 63.0 in | Wt 175.2 lb

## 2019-08-21 DIAGNOSIS — Z79899 Other long term (current) drug therapy: Secondary | ICD-10-CM | POA: Diagnosis not present

## 2019-08-21 DIAGNOSIS — Z23 Encounter for immunization: Secondary | ICD-10-CM

## 2019-08-21 DIAGNOSIS — E785 Hyperlipidemia, unspecified: Secondary | ICD-10-CM

## 2019-08-21 DIAGNOSIS — E039 Hypothyroidism, unspecified: Secondary | ICD-10-CM | POA: Diagnosis not present

## 2019-08-21 DIAGNOSIS — I10 Essential (primary) hypertension: Secondary | ICD-10-CM

## 2019-08-21 NOTE — Patient Instructions (Addendum)
Glad you are doing well.  Will notify you  of labs when available. And will get info to your cardiologist  consideration if helpful to use a statin medication for  Primary prevention.  Make sure colon cancer screening  Is UTD.

## 2019-08-22 LAB — CBC WITH DIFFERENTIAL/PLATELET
Absolute Monocytes: 479 cells/uL (ref 200–950)
Basophils Absolute: 71 cells/uL (ref 0–200)
Basophils Relative: 1.5 %
Eosinophils Absolute: 221 cells/uL (ref 15–500)
Eosinophils Relative: 4.7 %
HCT: 42.4 % (ref 35.0–45.0)
Hemoglobin: 14.3 g/dL (ref 11.7–15.5)
Lymphs Abs: 1701 cells/uL (ref 850–3900)
MCH: 31 pg (ref 27.0–33.0)
MCHC: 33.7 g/dL (ref 32.0–36.0)
MCV: 91.8 fL (ref 80.0–100.0)
MPV: 9.4 fL (ref 7.5–12.5)
Monocytes Relative: 10.2 %
Neutro Abs: 2228 cells/uL (ref 1500–7800)
Neutrophils Relative %: 47.4 %
Platelets: 322 10*3/uL (ref 140–400)
RBC: 4.62 10*6/uL (ref 3.80–5.10)
RDW: 12.1 % (ref 11.0–15.0)
Total Lymphocyte: 36.2 %
WBC: 4.7 10*3/uL (ref 3.8–10.8)

## 2019-08-22 LAB — TSH: TSH: 2.38 mIU/L (ref 0.40–4.50)

## 2019-08-22 LAB — HEPATIC FUNCTION PANEL
AG Ratio: 1.8 (calc) (ref 1.0–2.5)
ALT: 14 U/L (ref 6–29)
AST: 16 U/L (ref 10–35)
Albumin: 4.1 g/dL (ref 3.6–5.1)
Alkaline phosphatase (APISO): 69 U/L (ref 37–153)
Bilirubin, Direct: 0.1 mg/dL (ref 0.0–0.2)
Globulin: 2.3 g/dL (calc) (ref 1.9–3.7)
Indirect Bilirubin: 0.8 mg/dL (calc) (ref 0.2–1.2)
Total Bilirubin: 0.9 mg/dL (ref 0.2–1.2)
Total Protein: 6.4 g/dL (ref 6.1–8.1)

## 2019-08-22 LAB — LIPID PANEL
Cholesterol: 209 mg/dL — ABNORMAL HIGH (ref <200)
HDL: 51 mg/dL (ref >=50)
LDL Cholesterol (Calc): 133 mg/dL (calc) — ABNORMAL HIGH
Non-HDL Cholesterol (Calc): 158 mg/dL (calc) — ABNORMAL HIGH (ref <130)
Total CHOL/HDL Ratio: 4.1 (calc) (ref <5.0)
Triglycerides: 137 mg/dL (ref <150)

## 2019-08-22 NOTE — Progress Notes (Signed)
Results are normal except  the cholesterol is  up from previous  ( after your trip to [place]) I will send  results to  cardiology and you can discuss  beginning statin medication as we discussed  In interim Intensify lifestyle interventions.

## 2019-09-05 ENCOUNTER — Other Ambulatory Visit: Payer: Self-pay | Admitting: Internal Medicine

## 2019-09-17 ENCOUNTER — Other Ambulatory Visit: Payer: Self-pay | Admitting: Internal Medicine

## 2019-09-17 ENCOUNTER — Other Ambulatory Visit: Payer: Self-pay | Admitting: Cardiology

## 2019-10-05 DIAGNOSIS — R55 Syncope and collapse: Secondary | ICD-10-CM

## 2019-10-05 DIAGNOSIS — I4510 Unspecified right bundle-branch block: Secondary | ICD-10-CM

## 2019-10-05 HISTORY — DX: Unspecified right bundle-branch block: I45.10

## 2019-10-05 HISTORY — DX: Syncope and collapse: R55

## 2019-10-08 ENCOUNTER — Ambulatory Visit (INDEPENDENT_AMBULATORY_CARE_PROVIDER_SITE_OTHER): Payer: Medicare Other | Admitting: Physician Assistant

## 2019-10-08 ENCOUNTER — Other Ambulatory Visit: Payer: Self-pay

## 2019-10-08 ENCOUNTER — Encounter: Payer: Self-pay | Admitting: Physician Assistant

## 2019-10-08 VITALS — BP 118/78 | HR 62 | Ht 63.0 in | Wt 174.0 lb

## 2019-10-08 DIAGNOSIS — I10 Essential (primary) hypertension: Secondary | ICD-10-CM

## 2019-10-08 DIAGNOSIS — I351 Nonrheumatic aortic (valve) insufficiency: Secondary | ICD-10-CM | POA: Diagnosis not present

## 2019-10-08 DIAGNOSIS — E785 Hyperlipidemia, unspecified: Secondary | ICD-10-CM

## 2019-10-08 DIAGNOSIS — I493 Ventricular premature depolarization: Secondary | ICD-10-CM

## 2019-10-08 DIAGNOSIS — E039 Hypothyroidism, unspecified: Secondary | ICD-10-CM

## 2019-10-08 MED ORDER — ATORVASTATIN CALCIUM 20 MG PO TABS: 20.0000 mg | ORAL_TABLET | Freq: Every day | ORAL | 3 refills | Status: DC

## 2019-10-08 NOTE — Patient Instructions (Signed)
Medication Instructions:  START Lipitor 20 mg once a day *If you need a refill on your cardiac medications before your next appointment, please call your pharmacy*   Lab Work: Come to our office in 3 months for FASTING liver panel and lipid profile If you have labs (blood work) drawn today and your tests are completely normal, you will receive your results only by: [name] MyChart Message (if you have MyChart) OR . A paper copy in the mail If you have any lab test that is abnormal or we need to change your treatment, we will call you to review the results.   Testing/Procedures: None ordered   Follow-Up: At [facility], you and your health needs are our priority.  As part of our continuing mission to provide you with exceptional heart care, we have created designated Provider Care Teams.  These Care Teams include your primary Cardiologist (physician) and Advanced Practice Providers (APPs -  Physician Assistants and Nurse Practitioners) who all work together to provide you with the care you need, when you need it.  We recommend signing up for the patient portal called "MyChart".  Sign up information is provided on this After Visit Summary.  MyChart is used to connect with patients for Virtual Visits (Telemedicine).  Patients are able to view lab/test results, encounter notes, upcoming appointments, etc.  Non-urgent messages can be sent to your provider as well.   To learn more about what you can do with MyChart, go to [email or web address].    Your next appointment:   6 month(s)  The format for your next appointment:   In Person  Provider:   Dr. Glenetta Hew   Other Instructions None

## 2019-10-08 NOTE — Progress Notes (Deleted)
Cardiology Office Note:    Date:  10/08/2019   ID:  Jillian Escobar, DOB 1950-08-30, MRN [id number]  PCP:  Burnis Medin, MD  [facility] Cardiologist:  Glenetta Hew, MD  [place] Electrophysiologist:  None   Referring MD: Burnis Medin, MD   No chief complaint on file. ***  History of Present Illness:    Jillian Escobar is a 69 y.o. female with a hx of ***  Past Medical History:  Diagnosis Date  . Allergy   . Arthritis   . GERD (gastroesophageal reflux disease)   . Hx of varicella   . Hyperlipidemia    borderline  . Hypothyroidism   . Pneumonia 11/2014   Only time took inhalers to treat  . Shingles 10/26/2010  . Syncope and collapse    Unclear etiology   . Thyroid disease     Past Surgical History:  Procedure Laterality Date  . CESAREAN SECTION     x1  . CHOLECYSTECTOMY    . COLONOSCOPY    . ESOPHAGOGASTRODUODENOSCOPY (EGD) WITH PROPOFOL N/A 12/09/2015   Procedure: ESOPHAGOGASTRODUODENOSCOPY (EGD) WITH PROPOFOL;  Surgeon: Manus Gunning, MD;  Location: [facility] END[facility]COPY;  Service: Gastroenterology;  Laterality: N/A;  . EXPLORATORY LAPAROTOMY     Gallbladder  . POLYPECTOMY N/A 12/09/2015   Procedure: POLYPECTOMY;  Surgeon: Manus Gunning, MD;  Location: [facility];  Service: Gastroenterology;  Laterality: N/A;  . TUBAL LIGATION    . WISDOM TOOTH EXTRACTION      Current Medications: No outpatient medications have been marked as taking for the 10/08/19 encounter (Appointment) with Almyra Deforest, [place].     Allergies:   Levaquin [levofloxacin]   Social History   Socioeconomic History  . Marital status: Married    Spouse name: Not on file  . Number of children: 1  . Years of education: Not on file  . Highest education level: Not on file  Occupational History  . Not on file  Tobacco Use  . Smoking status: Former Smoker    Quit date: 01/04/1982    Years since quitting: 37.7  . Smokeless tobacco: Never Used  Vaping Use  . Vaping Use:  Former  Substance and Sexual Activity  . Alcohol use: Yes    Alcohol/week: 0.0 standard drinks    Comment: daily- 2 a night   . Drug use: No  . Sexual activity: Not on file  Other Topics Concern  . Not on file  Social History Narrative   H H of 2    Married, G2P1   Former tobacco;drinks beer wine   Walks maybe 30 minutes or more days a week. Doesn't do more than that because she is "lazy"   Pet 2 dog and 2 cats .   TA [place] pre [employer]   Originally from [place], moved over 20 years ago   Social Determinants of Radio broadcast assistant Strain:   . Difficulty of Paying Living Expenses: Not on file  Food Insecurity:   . Worried About Charity fundraiser in the Last Year: Not on file  . Ran Out of Food in the Last Year: Not on file  Transportation Needs:   . Lack of Transportation (Medical): Not on file  . Lack of Transportation (Non-Medical): Not on file  Physical Activity:   . Days of Exercise per Week: Not on file  . Minutes of Exercise per Session: Not on file  Stress:   . Feeling of  Stress : Not on file  Social Connections:   . Frequency of Communication with Friends and Family: Not on file  . Frequency of Social Gatherings with Friends and Family: Not on file  . Attends Religious Services: Not on file  . Active Member of Clubs or Organizations: Not on file  . Attends Archivist Meetings: Not on file  . Marital Status: Not on file     Family History: The patient's ***family history includes Arrhythmia in her daughter; COPD in her father; Colon cancer in her cousin; Dementia in her father; Glaucoma in her father; Heart disease in her father and mother; Ulcerative colitis in her brother and sister. There is no history of Colon polyps, Esophageal cancer, Rectal cancer, or Stomach cancer.  ROS:   Please see the history of present illness.    *** All other systems reviewed and are negative.  EKGs/Labs/Other Studies Reviewed:    The  following studies were reviewed today: ***  EKG:  EKG is *** ordered today.  The ekg ordered today demonstrates ***  Recent Labs: 04/04/2019: BUN 15; Creatinine, Ser 0.94; Potassium 4.1; Sodium 139 08/21/2019: ALT 14; Hemoglobin 14.3; Platelets 322; TSH 2.38  Recent Lipid Panel    Component Value Date/Time   CHOL 209 (H) 08/21/2019 0915   TRIG 137 08/21/2019 0915   HDL 51 08/21/2019 0915   CHOLHDL 4.1 08/21/2019 0915   VLDL 18.4 07/26/2018 0803   LDLCALC 133 (H) 08/21/2019 0915   LDLDIRECT 158.4 08/14/2012 0828    Physical Exam:    VS:  There were no vitals taken for this visit.    Wt Readings from Last 3 Encounters:  08/21/19 175 lb 3.2 oz (79.5 kg)  05/09/19 165 lb (74.8 kg)  04/04/19 174 lb 6.4 oz (79.1 kg)     GEN: *** Well nourished, well developed in no acute distress HEENT: Normal NECK: No JVD; No carotid bruits LYMPHATICS: No lymphadenopathy CARDIAC: ***RRR, no murmurs, rubs, gallops RESPIRATORY:  Clear to auscultation without rales, wheezing or rhonchi  ABDOMEN: Soft, non-tender, non-distended MUSCULOSKELETAL:  No edema; No deformity  SKIN: Warm and dry NEUROLOGIC:  Alert and oriented x 3 PSYCHIATRIC:  Normal affect   ASSESSMENT:    No diagnosis found. PLAN:    In order of problems listed above:  1. ***   Medication Adjustments/Labs and Tests Ordered: Current medicines are reviewed at length with the patient today.  Concerns regarding medicines are outlined above.  No orders of the defined types were placed in this encounter.  No orders of the defined types were placed in this encounter.   There are no Patient Instructions on file for this visit.   Hilbert Corrigan, [place]  10/08/2019 1:23 PM    [facility]

## 2019-10-08 NOTE — Progress Notes (Signed)
Cardiology Office Note:    Date:  10/10/2019   ID:  Jillian Escobar, DOB 1950-10-16, MRN [id number]  PCP:  Burnis Medin, MD  [facility] Cardiologist:  Glenetta Hew, MD  [place] Electrophysiologist:  None   Referring MD: Burnis Medin, MD   Chief Complaint  Patient presents with  . Follow-up    seen for Dr. Ellyn Hack    History of Present Illness:    Jillian Escobar is a 69 y.o. female with a hx of hypertension, mild to moderate AI, hyperlipidemia, hypothyroidism, tobacco abuse, and history of presyncope.  She has family history of premature CAD.  Previous Myoview obtained on 01/14/2015 showed EF 71%, normal myocardial effusion, she was able to achieve 9.5 METS of activity.  Echocardiogram obtained on 11/07/2017 showed EF 60 to 65%, grade 2 DD, mild to moderate AI.  She was previously evaluated for chest pain and dyspnea on exertion.  Her chest pain improved however she continued to have exertional dyspnea which was felt to be related to deconditioning.  To further evaluate her exertional dyspnea, Dr. Ellyn Hack recommend a cardiopulmonary stress test in June 2020.  It does not appears this was ever done, likely due to restrictions during COVID-19 pandemic.  I last saw the patient in March 2021 due to frequent palpitation.  I suspected that she had a symptomatic PVCs and PACs.  I recommended 2 weeks heart monitor.  Given her underlying right bundle branch block and left anterior fascicular block, I did not recommend rate control therapy due to concern of worsening conduction disease.  Patient presents today for follow-up.  She did visit her daughter at [facility].  Otherwise she denies any recent significant palpitation.  She noted a few skipped heartbeat today after she had the COVID-19 vaccine.  Otherwise she has no lower extremity edema, orthopnea or PND.  She denies any recent chest pain.  She can follow-up in 6 months.    Past Medical History:  Diagnosis Date  .  Allergy   . Arthritis   . GERD (gastroesophageal reflux disease)   . Hx of varicella   . Hyperlipidemia    borderline  . Hypothyroidism   . Pneumonia 11/2014   Only time took inhalers to treat  . Shingles 10/26/2010  . Syncope and collapse    Unclear etiology   . Thyroid disease     Past Surgical History:  Procedure Laterality Date  . CESAREAN SECTION     x1  . CHOLECYSTECTOMY    . COLONOSCOPY    . ESOPHAGOGASTRODUODENOSCOPY (EGD) WITH PROPOFOL N/A 12/09/2015   Procedure: ESOPHAGOGASTRODUODENOSCOPY (EGD) WITH PROPOFOL;  Surgeon: Manus Gunning, MD;  Location: [facility];  Service: Gastroenterology;  Laterality: N/A;  . EXPLORATORY LAPAROTOMY     Gallbladder  . POLYPECTOMY N/A 12/09/2015   Procedure: POLYPECTOMY;  Surgeon: Manus Gunning, MD;  Location: [facility];  Service: Gastroenterology;  Laterality: N/A;  . TUBAL LIGATION    . WISDOM TOOTH EXTRACTION      Current Medications: Current Meds  Medication Sig  . albuterol (VENTOLIN HFA) 108 (90 Base) MCG/ACT inhaler TAKE 2 PUFFS BY MOUTH EVERY 6 HOURS AS NEEDED FOR WHEEZE OR SHORTNESS OF BREATH  . Bioflavonoid Products (BIOFLEX PO) Take by mouth in the morning and at bedtime.  . calcium carbonate (CALCIUM 600) 600 MG TABS tablet   . Cholecalciferol (VITAMIN D PO) Take 2 tablets by mouth daily.  . DUEXIS 800-26.6 MG TABS Take 1 tablet by mouth  daily as needed.  [name] ELDERBERRY PO daily.   . famotidine (PEPCID) 20 MG tablet Take 20 mg by mouth as needed for heartburn or indigestion.  . hydrochlorothiazide (MICROZIDE) 12.5 MG capsule TAKE 1 CAPSULE BY MOUTH EVERY DAY  . KRILL OIL PO Take by mouth in the morning and at bedtime.  [name] levothyroxine (SYNTHROID) 50 MCG tablet Take 1 tablet (50 mcg total) by mouth daily.  [name] losartan (COZAAR) 25 MG tablet TAKE 2 TABLETS (50 MG TOTAL) BY MOUTH DAILY. FOR HIGH BLOOD PRESSURE  . Misc Natural Products (OSTEO BI-FLEX/5-LOXIN ADVANCED PO) Take by mouth 2 (two) times daily at  8 am and 10 pm.  . Multiple Vitamin (MULTIVITAMIN WITH MINERALS) TABS tablet Take 1 tablet by mouth daily.  [name] OVER THE COUNTER MEDICATION Place 1 Dose under the tongue daily. CBD oil   . vitamin A 3 MG (10000 UNITS) capsule Take 10,000 Units by mouth daily.     Allergies:   Levaquin [levofloxacin]   Social History   Socioeconomic History  . Marital status: Married    Spouse name: Not on file  . Number of children: 1  . Years of education: Not on file  . Highest education level: Not on file  Occupational History  . Not on file  Tobacco Use  . Smoking status: Former Smoker    Quit date: 01/04/1982    Years since quitting: 37.7  . Smokeless tobacco: Never Used  Vaping Use  . Vaping Use: Former  Substance and Sexual Activity  . Alcohol use: Yes    Alcohol/week: 0.0 standard drinks    Comment: daily- 2 a night   . Drug use: No  . Sexual activity: Not on file  Other Topics Concern  . Not on file  Social History Narrative   H H of 2    Married, G2P1   Former tobacco;drinks beer wine   Walks maybe 30 minutes or more days a week. Doesn't do more than that because she is "lazy"   Pet 2 dog and 2 cats .   [employer] [place] [employer]   Originally from [place], moved over 20 years ago   Social Determinants of Radio broadcast assistant Strain:   . Difficulty of Paying Living Expenses: Not on file  Food Insecurity:   . Worried About Charity fundraiser in the Last Year: Not on file  . Ran Out of Food in the Last Year: Not on file  Transportation Needs:   . Lack of Transportation (Medical): Not on file  . Lack of Transportation (Non-Medical): Not on file  Physical Activity:   . Days of Exercise per Week: Not on file  . Minutes of Exercise per Session: Not on file  Stress:   . Feeling of Stress : Not on file  Social Connections:   . Frequency of Communication with Friends and Family: Not on file  . Frequency of Social Gatherings with Friends and Family: Not  on file  . Attends Religious Services: Not on file  . Active Member of Clubs or Organizations: Not on file  . Attends Archivist Meetings: Not on file  . Marital Status: Not on file     Family History: The patient's family history includes Arrhythmia in her daughter; COPD in her father; Colon cancer in her cousin; Dementia in her father; Glaucoma in her father; Heart disease in her father and mother; Ulcerative colitis in her brother and sister. There is  no history of Colon polyps, Esophageal cancer, Rectal cancer, or Stomach cancer.  ROS:   Please see the history of present illness.     All other systems reviewed and are negative.  EKGs/Labs/Other Studies Reviewed:    The following studies were reviewed today:  Echo 11/07/2017 LV EF: 60% -  65%   -------------------------------------------------------------------  Indications:   R01.1 Murmur.   -------------------------------------------------------------------  History:  PMH: Acquired from the patient and from the patient&'s  chart. PMH: Hypothyroidism. Pneumonia. Syncope and collapse.  Risk factors: Dyslipidemia.   -------------------------------------------------------------------  Study Conclusions   - Left ventricle: The cavity size was normal. There was mild  concentric hypertrophy. Systolic function was normal. The  estimated ejection fraction was in the range of 60% to 65%. Wall  motion was normal; there were no regional wall motion  abnormalities. Features are consistent with a pseudonormal left  ventricular filling pattern, with concomitant abnormal relaxation  and increased filling pressure (grade 2 diastolic dysfunction).  Doppler parameters are consistent with elevated ventricular  end-diastolic filling pressure.  - Aortic valve: There was mild to moderate regurgitation directed  centrally in the LVOT.   EKG:  EKG is ordered today.  The ekg ordered today demonstrates normal  sinus rhythm, right bundle branch block, left anterior fascicular block.  Recent Labs: 04/04/2019: BUN 15; Creatinine, Ser 0.94; Potassium 4.1; Sodium 139 08/21/2019: ALT 14; Hemoglobin 14.3; Platelets 322; TSH 2.38  Recent Lipid Panel    Component Value Date/Time   CHOL 209 (H) 08/21/2019 0915   TRIG 137 08/21/2019 0915   HDL 51 08/21/2019 0915   CHOLHDL 4.1 08/21/2019 0915   VLDL 18.4 07/26/2018 0803   LDLCALC 133 (H) 08/21/2019 0915   LDLDIRECT 158.4 08/14/2012 0828    Physical Exam:    VS:  BP 118/78   Pulse 62   Ht 5\' 3"  (1.6 m)   Wt 174 lb (78.9 kg)   SpO2 98%   BMI 30.82 kg/m     Wt Readings from Last 3 Encounters:  10/08/19 174 lb (78.9 kg)  08/21/19 175 lb 3.2 oz (79.5 kg)  05/09/19 165 lb (74.8 kg)     GEN:  Well nourished, well developed in no acute distress HEENT: Normal NECK: No JVD; No carotid bruits LYMPHATICS: No lymphadenopathy CARDIAC: RRR, no murmurs, rubs, gallops RESPIRATORY:  Clear to auscultation without rales, wheezing or rhonchi  ABDOMEN: Soft, non-tender, non-distended MUSCULOSKELETAL:  No edema; No deformity  SKIN: Warm and dry NEUROLOGIC:  Alert and oriented x 3 PSYCHIATRIC:  Normal affect   ASSESSMENT:    1. Symptomatic PVCs   2. Hyperlipidemia LDL goal <100   3. Essential hypertension   4. Nonrheumatic aortic valve insufficiency   5. Hypothyroidism, unspecified type    PLAN:    In order of problems listed above:  1. Symptomatic PVCs: However at this time and does not warrant any treatment.  Given the fact she has right bundle branch block with left anterior fascicular block, would avoid AV nodal blocking agent which can worsen conduction disease   2. Hyperlipidemia: Start on 20 mg daily Lipitor  3. Hypertension: Blood pressure stable  4. Aortic valve insufficiency: Stable on last echocardiogram  5. Hypothyroidism: Managed by primary care provider.   Medication Adjustments/Labs and Tests Ordered: Current medicines are  reviewed at length with the patient today.  Concerns regarding medicines are outlined above.  Orders Placed This Encounter  Procedures  . Lipid Profile  . Hepatic function panel   Meds ordered  this encounter  Medications  . atorvastatin (LIPITOR) 20 MG tablet    Sig: Take 1 tablet (20 mg total) by mouth daily.    Dispense:  90 tablet    Refill:  3    Patient Instructions  Medication Instructions:  START Lipitor 20 mg once a day *If you need a refill on your cardiac medications before your next appointment, please call your pharmacy*   Lab Work: Come to our office in 3 months for FASTING liver panel and lipid profile If you have labs (blood work) drawn today and your tests are completely normal, you will receive your results only by: [name] MyChart Message (if you have MyChart) OR . A paper copy in the mail If you have any lab test that is abnormal or we need to change your treatment, we will call you to review the results.   Testing/Procedures: None ordered   Follow-Up: At [facility], you and your health needs are our priority.  As part of our continuing mission to provide you with exceptional heart care, we have created designated Provider Care Teams.  These Care Teams include your primary Cardiologist (physician) and Advanced Practice Providers (APPs -  Physician Assistants and Nurse Practitioners) who all work together to provide you with the care you need, when you need it.  We recommend signing up for the patient portal called "MyChart".  Sign up information is provided on this After Visit Summary.  MyChart is used to connect with patients for Virtual Visits (Telemedicine).  Patients are able to view lab/test results, encounter notes, upcoming appointments, etc.  Non-urgent messages can be sent to your provider as well.   To learn more about what you can do with MyChart, go to [email or web address].    Your next appointment:   6 month(s)  The format for your next  appointment:   In Person  Provider:   Dr. Glenetta Hew   Other Instructions None     Signed, Almyra Deforest, PA  10/10/2019 6:44 PM    [place]

## 2019-10-09 ENCOUNTER — Other Ambulatory Visit: Payer: Self-pay | Admitting: Internal Medicine

## 2019-10-09 DIAGNOSIS — Z1231 Encounter for screening mammogram for malignant neoplasm of breast: Secondary | ICD-10-CM

## 2019-10-10 ENCOUNTER — Encounter: Payer: Self-pay | Admitting: Physician Assistant

## 2019-10-11 NOTE — Addendum Note (Signed)
Addended by: Wonda Horner on: 10/11/2019 12:27 PM   Modules accepted: Orders

## 2019-10-16 ENCOUNTER — Other Ambulatory Visit: Payer: Self-pay

## 2019-10-16 ENCOUNTER — Ambulatory Visit (AMBULATORY_SURGERY_CENTER): Payer: Self-pay | Admitting: *Deleted

## 2019-10-16 VITALS — Ht 63.0 in | Wt 173.6 lb

## 2019-10-16 DIAGNOSIS — Z800 Family history of malignant neoplasm of digestive organs: Secondary | ICD-10-CM

## 2019-10-16 DIAGNOSIS — Z1211 Encounter for screening for malignant neoplasm of colon: Secondary | ICD-10-CM

## 2019-10-16 MED ORDER — SUTAB 1479-225-188 MG PO TABS: 1.0000 | ORAL_TABLET | ORAL | 0 refills | Status: DC

## 2019-10-16 NOTE — Progress Notes (Signed)
Patient denies any allergies to egg or soy products. Patient denies complications with anesthesia/sedation.  Patient denies oxygen use at home and denies diet medications. Patient denied information on a colonoscopy procedure. Patient had both covid vaccinations, last one on 02/16/19.  Booster vaccination on 10/02/19.

## 2019-10-21 ENCOUNTER — Other Ambulatory Visit: Payer: Self-pay | Admitting: Internal Medicine

## 2019-10-29 ENCOUNTER — Encounter: Payer: Self-pay | Admitting: Gastroenterology

## 2019-10-29 ENCOUNTER — Other Ambulatory Visit: Payer: Self-pay

## 2019-10-29 ENCOUNTER — Ambulatory Visit (AMBULATORY_SURGERY_CENTER): Payer: Medicare Other | Admitting: Gastroenterology

## 2019-10-29 VITALS — BP 106/54 | HR 62 | Temp 97.6°F | Resp 13 | Ht 63.0 in | Wt 173.6 lb

## 2019-10-29 DIAGNOSIS — D124 Benign neoplasm of descending colon: Secondary | ICD-10-CM

## 2019-10-29 DIAGNOSIS — Z1211 Encounter for screening for malignant neoplasm of colon: Secondary | ICD-10-CM

## 2019-10-29 DIAGNOSIS — D125 Benign neoplasm of sigmoid colon: Secondary | ICD-10-CM

## 2019-10-29 DIAGNOSIS — K635 Polyp of colon: Secondary | ICD-10-CM

## 2019-10-29 MED ORDER — SODIUM CHLORIDE 0.9 % IV SOLN: 500.0000 mL | Freq: Once | INTRAVENOUS | Status: DC

## 2019-10-29 NOTE — Progress Notes (Signed)
PT taken to PACU. Monitors in place. VSS. Report given to RN. 

## 2019-10-29 NOTE — Op Note (Signed)
Jillian Escobar Procedure Date: 10/29/2019 11:00 AM MRN: [id number] Endoscopist: Remo Lipps P. Havery Moros , MD Age: 69 Referring MD:  Date of Birth: 12-18-1950 Gender: Female Account #: [id number] Procedure:                Colonoscopy Indications:              Screening for colorectal malignant neoplasm Medicines:                Monitored Anesthesia Care Procedure:                Pre-Anesthesia Assessment:                           - Prior to the procedure, a History and Physical                            was performed, and patient medications and                            allergies were reviewed. The patient's tolerance of                            previous anesthesia was also reviewed. The risks                            and benefits of the procedure and the sedation                            options and risks were discussed with the patient.                            All questions were answered, and informed consent                            was obtained. Prior Anticoagulants: The patient has                            taken no previous anticoagulant or antiplatelet                            agents. ASA Grade Assessment: II - A patient with                            mild systemic disease. After reviewing the risks                            and benefits, the patient was deemed in                            satisfactory condition to undergo the procedure.                           After obtaining informed consent, the colonoscope  was passed under direct vision. Throughout the                            procedure, the patient's blood pressure, pulse, and                            oxygen saturations were monitored continuously. The                            Colonoscope was introduced through the anus and                            advanced to the the terminal ileum, with                            identification of  the appendiceal orifice and IC                            valve. The colonoscopy was performed without                            difficulty. The patient tolerated the procedure                            well. The quality of the bowel preparation was                            good. The terminal ileum, ileocecal valve,                            appendiceal orifice, and rectum were photographed. Scope In: 11:14:46 AM Scope Out: 11:30:21 AM Scope Withdrawal Time: 0 hours 13 minutes 23 seconds  Total Procedure Duration: 0 hours 15 minutes 35 seconds  Findings:                 The perianal and digital rectal examinations were                            normal.                           The terminal ileum appeared normal.                           A 3 mm polyp was found in the descending colon. The                            polyp was sessile. The polyp was removed with a                            cold snare. Resection and retrieval were complete.                           A 3 mm polyp was found in the sigmoid colon. The  polyp was sessile. The polyp was removed with a                            cold snare. Resection and retrieval were complete.                           Multiple small-mouthed diverticula were found in                            the sigmoid colon.                           Internal hemorrhoids were found during                            retroflexion. The hemorrhoids were small.                           The exam was otherwise without abnormality. Complications:            No immediate complications. Estimated blood loss:                            Minimal. Estimated Blood Loss:     Estimated blood loss was minimal. Impression:               - The examined portion of the ileum was normal.                           - One 3 mm polyp in the descending colon, removed                            with a cold snare. Resected and retrieved.                            - One 3 mm polyp in the sigmoid colon, removed with                            a cold snare. Resected and retrieved.                           - Diverticulosis in the sigmoid colon.                           - Internal hemorrhoids.                           - The examination was otherwise normal. Recommendation:           - Patient has a contact number available for                            emergencies. The signs and symptoms of potential                            delayed complications were discussed  with the                            patient. Return to normal activities tomorrow.                            Written discharge instructions were provided to the                            patient.                           - Resume previous diet.                           - Continue present medications.                           - Await pathology results. Remo Lipps P. Jillian Rotunno, MD 10/29/2019 11:34:25 AM This report has been signed electronically.

## 2019-10-29 NOTE — Patient Instructions (Signed)
Read all of the handouts given to you by your recovery room nurse.   You do have a handouts regarding bandiing.  yOU HAD AN ENDOSCOPIC PROCEDURE TODAY AT [facility]:   Refer to the procedure report that was given to you for any specific questions about what was found during the examination.  If the procedure report does not answer your questions, please call your gastroenterologist to clarify.  If you requested that your care partner not be given the details of your procedure findings, then the procedure report has been included in a sealed envelope for you to review at your convenience later.  YOU SHOULD EXPECT: Some feelings of bloating in the abdomen. Passage of more gas than usual.  Walking can help get rid of the air that was put into your GI tract during the procedure and reduce the bloating. If you had a lower endoscopy (such as a colonoscopy or flexible sigmoidoscopy) you may notice spotting of blood in your stool or on the toilet paper. If you underwent a bowel prep for your procedure, you may not have a normal bowel movement for a few days.  Please Note:  You might notice some irritation and congestion in your nose or some drainage.  This is from the oxygen used during your procedure.  There is no need for concern and it should clear up in a day or so.  SYMPTOMS TO REPORT IMMEDIATELY:   Following lower endoscopy (colonoscopy or flexible sigmoidoscopy):  Excessive amounts of blood in the stool  Significant tenderness or worsening of abdominal pains  Swelling of the abdomen that is new, acute  Fever of 100F or higher   For urgent or emergent issues, a gastroenterologist can be reached at any hour by calling [phone]. Do not use MyChart messaging for urgent concerns.    DIET:  We do recommend a small meal at first, but then you may proceed to your regular diet.  Drink plenty of fluids but you should avoid alcoholic beverages for 24 hours. Try to increase the  fiber in your diet, and drink plenty of water.  ACTIVITY:  You should plan to take it easy for the rest of today and you should NOT DRIVE or use heavy machinery until tomorrow (because of the sedation medicines used during the test).    FOLLOW UP: Our staff will call the number listed on your records 48-72 hours following your procedure to check on you and address any questions or concerns that you may have regarding the information given to you following your procedure. If we do not reach you, we will leave a message.  We will attempt to reach you two times.  During this call, we will ask if you have developed any symptoms of COVID 19. If you develop any symptoms (ie: fever, flu-like symptoms, shortness of breath, cough etc.) before then, please call [phone].  If you test positive for Covid 19 in the 2 weeks post procedure, please call and report this information to [place].    If any biopsies were taken you will be contacted by phone or by letter within the next 1-3 weeks.  Please call us at [phone] if you have not heard about the biopsies in 3 weeks.    SIGNATURES/CONFIDENTIALITY: You and/or your care partner have signed paperwork which will be entered into your electronic medical record.  These signatures attest to the fact that that the information above on your After Visit Summary has been reviewed and  is understood.  Full responsibility of the confidentiality of this discharge information lies with you and/or your care-partner.

## 2019-10-29 NOTE — Progress Notes (Signed)
VS  By CW ° ° °Pt's states no medical or surgical changes since previsit or office visit. ° °

## 2019-10-29 NOTE — Progress Notes (Signed)
Called to room to assist during endoscopic procedure.  Patient ID and intended procedure confirmed with present staff. Received instructions for my participation in the procedure from the performing physician.  

## 2019-10-31 ENCOUNTER — Telehealth: Payer: Self-pay | Admitting: *Deleted

## 2019-10-31 NOTE — Telephone Encounter (Signed)
First f/u call attempt. No answer or ability to leave message.

## 2019-10-31 NOTE — Telephone Encounter (Signed)
  Follow up Call-  Call back number 10/29/2019  Post procedure Call Back phone  # [phone]  Permission to leave phone message Yes  Some recent data might be hidden    No answer and no machine to leave a message at

## 2019-11-23 ENCOUNTER — Ambulatory Visit: Payer: Medicare Other

## 2020-01-06 NOTE — Progress Notes (Signed)
Chief Complaint  Patient presents with   Acute Visit    Patient c/o having stomach pain/gas x 1 week.  She has taken Pepcid twice daily with no relief.      HPI: Jillian Escobar 70 y.o. come in for prob with GI symptoms that began when she was on her vacation trip to [place].  They had eaten very differently lots of spicy foods some alcohol.  Had  Colon screen 10 21  Began to have after eating spicy pork meal pain and discomfort has ruq under right breast and buring  Feels like needed to burp and since that time has not been right.  Symptoms are about a week they just got back from travel a few days ago She has taken Pepcid not back great no fever or vomiting some nausea  Ate  Light    so far Spicy pork and then never the same.  Tried probiotic since her travel to [place] had no symptoms then Her husband had no symptoms on this trip but had diarrhea in November.  No past history of ulcer has had a cholecystectomy. Was on a PPI in the remote past none recently had been doing okay. Feels like she has gained weight even though not that different is going to work on this No tobacco.  Has cut down caffeine. ROS: See pertinent positives and negatives per HPI.  Past Medical History:  Diagnosis Date   Allergy    Arthritis    GERD (gastroesophageal reflux disease)    Hx of varicella    Hyperlipidemia    borderline   Hypertension    Hypothyroidism    Pneumonia 11/2014   Only time took inhalers to treat   RBBB 10/2019   recently seen at MD - f/u 6 mons, no treatment   Shingles 10/26/2010   Syncope and collapse 10/2019   Unclear etiology - just saw MD - f/u 6 months - no treatment   Thyroid disease     Family History  Problem Relation Age of Onset   Heart disease Mother        stent in 48 s   COPD Father    Heart disease Father        cabg in 43s    Glaucoma Father    Dementia Father    Ulcerative colitis Sister    Ulcerative colitis Brother     Arrhythmia Daughter    Colon cancer Cousin    Colon polyps Neg Hx    Esophageal cancer Neg Hx    Rectal cancer Neg Hx    Stomach cancer Neg Hx     Social History   Socioeconomic History   Marital status: Married    Spouse name: Not on file   Number of children: 1   Years of education: Not on file   Highest education level: Not on file  Occupational History   Not on file  Tobacco Use   Smoking status: Former Smoker    Types: Cigarettes    Quit date: 01/04/1982    Years since quitting: 38.0   Smokeless tobacco: Never Used   Tobacco comment: Marijuana daily  Vaping Use   Vaping Use: Former  Substance and Sexual Activity   Alcohol use: Yes    Alcohol/week: 14.0 standard drinks    Types: 14 Standard drinks or equivalent per week    Comment: daily- 2 a night    Drug use: Yes    Types: Marijuana  Comment: Last use 10/15/19- Marijuana Daily   Sexual activity: Yes    Birth control/protection: Post-menopausal  Other Topics Concern   Not on file  Social History Narrative   H H of 2    Married, G2P1   Former tobacco;drinks beer wine   Walks maybe 30 minutes or more days a week. Doesn't do more than that because she is "lazy"   Pet 2 dog and 2 cats .   [employer]   Originally from [place], moved over 20 years ago   Social Determinants of Radio broadcast assistant Strain: Not on [employer] Insecurity: Not on file  Transportation Needs: Not on file  Physical Activity: Not on file  Stress: Not on file  Social Connections: Not on file    Outpatient Medications Prior to Visit  Medication Sig Dispense Refill   albuterol (VENTOLIN HFA) 108 (90 Base) MCG/ACT inhaler TAKE 2 PUFFS BY MOUTH EVERY 6 HOURS AS NEEDED FOR WHEEZE OR SHORTNESS OF BREATH 6.7 each 1   atorvastatin (LIPITOR) 20 MG tablet Take 1 tablet (20 mg total) by mouth daily. 90 tablet 3   calcium carbonate (OS-CAL) 600 MG TABS tablet       Cholecalciferol (VITAMIN D PO) Take 2 tablets by mouth daily.     DUEXIS 800-26.6 MG TABS Take 1 tablet by mouth daily as needed.  2   ELDERBERRY PO daily.      famotidine (PEPCID) 20 MG tablet Take 20 mg by mouth as needed for heartburn or indigestion.     hydrochlorothiazide (MICROZIDE) 12.5 MG capsule TAKE 1 CAPSULE BY MOUTH EVERY DAY 90 capsule 2   KRILL OIL PO Take by mouth in the morning and at bedtime.     levothyroxine (SYNTHROID) 50 MCG tablet Take 1 tablet (50 mcg total) by mouth daily. 90 tablet 1   losartan (COZAAR) 25 MG tablet TAKE 2 TABLETS (50 MG TOTAL) BY MOUTH DAILY. FOR HIGH BLOOD PRESSURE 180 tablet 1   Misc Natural Products (OSTEO BI-FLEX/5-LOXIN ADVANCED PO) Take by mouth 2 (two) times daily at 8 am and 10 pm.     Multiple Vitamin (MULTIVITAMIN WITH MINERALS) TABS tablet Take 1 tablet by mouth daily.      OVER THE COUNTER MEDICATION Take 1 capsule by mouth daily.      vitamin A 3 MG (10000 UNITS) capsule Take 10,000 Units by mouth daily.     No facility-administered medications prior to visit.     EXAM:  BP 122/78    Pulse 82    Temp 98.2 F (36.8 C) (Oral)    Ht 5\' 3"  (1.6 m)    Wt 175 lb 3.2 oz (79.5 kg)    LMP  (LMP Unknown)    SpO2 98%    BMI 31.04 kg/m   Body mass index is 31.04 kg/m. Wt Readings from Last 3 Encounters:  01/07/20 175 lb 3.2 oz (79.5 kg)  10/29/19 173 lb 9.6 oz (78.7 kg)  10/16/19 173 lb 9.6 oz (78.7 kg)    GENERAL: vitals reviewed and listed above, alert, oriented, appears well hydrated and in no acute distress HEENT: atraumatic, conjunctiva  clear, no obvious abnormalities on inspection of external nose and ears OP : masked  NECK: no obvious masses on inspection palpation  LUNGS: clear to auscultation bilaterally, no wheezes, rales or rhonchi, good air movement CV: HRRR, no clubbing cyanosis or  peripheral edema nl cap refill  Abdomen soft without  again a megaly guarding or rebound area of concern is probably mid high  epigastrium but no rebound. MS: moves all extremities without noticeable focal  abnormality PSYCH: pleasant and cooperative, no obvious depression or anxiety Lab Results  Component Value Date   WBC 4.7 08/21/2019   HGB 14.3 08/21/2019   HCT 42.4 08/21/2019   PLT 322 08/21/2019   GLUCOSE 87 04/04/2019   CHOL 209 (H) 08/21/2019   TRIG 137 08/21/2019   HDL 51 08/21/2019   LDLDIRECT 158.4 08/14/2012   LDLCALC 133 (H) 08/21/2019   ALT 14 08/21/2019   AST 16 08/21/2019   NA 139 04/04/2019   K 4.1 04/04/2019   CL 101 04/04/2019   CREATININE 0.94 04/04/2019   BUN 15 04/04/2019   CO2 22 04/04/2019   TSH 2.38 08/21/2019   HGBA1C 5.4 07/26/2018   BP Readings from Last 3 Encounters:  01/07/20 122/78  10/29/19 (!) 106/54  10/08/19 118/78    ASSESSMENT AND PLAN:  Discussed the following assessment and plan:  Epigastric pain  Dyspepsia Suspect acid peptic symptoms as already had a cholecystectomy Add acid suppression continued alcohol avoidance as well as dietary changes if no response in 10 to 14 days consider further evaluation.  Consider evaluation for H. pylori. Follows up with cardiology soon on lipid therapy. She can add on Pepcid if needed otherwise can take the generic Nexium that she has in an appropriate manner and away from her Synthroid.  -Patient advised to return or notify health care team  if  new concerns arise.  Patient Instructions   This acts like   Gastritis   Generic nexium or prilosec.   30 - 60 minutes before eating .   For  10 - 14 days .    Works  More completely  Than pepcid .    Wt Readings from Last 3 Encounters:  01/07/20 175 lb 3.2 oz (79.5 kg)  10/29/19 173 lb 9.6 oz (78.7 kg)  10/16/19 173 lb 9.6 oz (78.7 kg)    Avoid   Alcohol.   And  Spicy fatty foods .   Let me know how doing in about 2 weeks and if not better   Next step.    Gastritis, Adult Gastritis is inflammation of the stomach. There are two kinds of gastritis:  Acute  gastritis. This kind develops suddenly.  Chronic gastritis. This kind is much more common and lasts for a long time. Gastritis happens when the lining of the stomach becomes weak or gets damaged. Without treatment, gastritis can lead to stomach bleeding and ulcers. What are the causes? This condition may be caused by:  An infection.  Drinking too much alcohol.  Certain medicines. These include steroids, antibiotics, and some over-the-counter medicines, such as aspirin or ibuprofen.  Having too much acid in the stomach.  A disease of the intestines or stomach.  Stress.  An allergic reaction.  Crohn's disease.  Some cancer treatments (radiation). Sometimes the cause of this condition is not known. What are the signs or symptoms? Symptoms of this condition include:  Pain or a burning sensation in the upper abdomen.  Nausea.  Vomiting.  An uncomfortable feeling of fullness after eating.  Weight loss.  Bad breath.  Blood in your vomit or stools. In some cases, there are no symptoms. How is this diagnosed? This condition may be diagnosed with:  Your medical history and a description of your symptoms.  A physical exam.  Tests. These can include: ? Blood tests. ?  Stool tests. ? A test in which a thin, flexible instrument with a light and a camera is passed down the esophagus and into the stomach (upper endoscopy). ? A test in which a sample of tissue is taken for testing (biopsy). How is this treated? This condition may be treated with medicines. The medicines that are used vary depending on the cause of the gastritis:  If the condition is caused by a bacterial infection, you may be given antibiotic medicines.  If the condition is caused by too much acid in the stomach, you may be given medicines called H2 blockers, proton pump inhibitors, or antacids. Treatment may also involve stopping the use of certain medicines, such as aspirin, ibuprofen, or other  NSAIDs. Follow these instructions at home: Medicines  Take over-the-counter and prescription medicines only as told by your health care provider.  If you were prescribed an antibiotic medicine, take it as told by your health care provider. Do not stop taking the antibiotic even if you start to feel better. Eating and drinking   Eat small, frequent meals instead of large meals.  Avoid foods and drinks that make your symptoms worse.  Drink enough fluid to keep your urine pale yellow. Alcohol use  Do not drink alcohol if: ? Your health care provider tells you not to drink. ? You are pregnant, may be pregnant, or are planning to become pregnant.  If you drink alcohol: ? Limit your use to:  0-1 drink a day for women.  0-2 drinks a day for men. ? Be aware of how much alcohol is in your drink. In the [place] one drink equals one 12 oz bottle of beer (355 mL), one 5 oz glass of wine (148 mL), or one 1 oz glass of hard liquor (44 mL). General instructions  Talk with your health care provider about ways to manage stress, such as getting regular exercise or practicing deep breathing, meditation, or yoga.  Do not use any products that contain nicotine or tobacco, such as cigarettes and e-cigarettes. If you need help quitting, ask your health care provider.  Keep all follow-up visits as told by your health care provider. This is important. Contact a health care provider if:  Your symptoms get worse.  Your symptoms return after treatment. Get help right away if:  You vomit blood or material that looks like coffee grounds.  You have black or dark red stools.  You are unable to keep fluids down.  Your abdominal pain gets worse.  You have a fever.  You do not feel better after one week. Summary  Gastritis is inflammation of the lining of the stomach that can occur suddenly (acute) or develop slowly over time (chronic).  This condition is diagnosed with a medical history, a  physical exam, or tests.  This condition may be treated with medicines to treat infection or medicines to reduce the amount of acid in your stomach.  Follow your health care provider's instructions about taking medicines, making changes to your diet, and knowing when to call for help. This information is not intended to replace advice given to you by your health care provider. Make sure you discuss any questions you have with your health care provider. Document Revised: 05/10/2017 Document Reviewed: 05/10/2017 Elsevier Patient Education  2020 [place] Coraleigh Sheeran M.D.

## 2020-01-07 ENCOUNTER — Ambulatory Visit (INDEPENDENT_AMBULATORY_CARE_PROVIDER_SITE_OTHER): Payer: Medicare Other | Admitting: Internal Medicine

## 2020-01-07 ENCOUNTER — Encounter: Payer: Self-pay | Admitting: Internal Medicine

## 2020-01-07 ENCOUNTER — Other Ambulatory Visit: Payer: Self-pay

## 2020-01-07 VITALS — BP 122/78 | HR 82 | Temp 98.2°F | Ht 63.0 in | Wt 175.2 lb

## 2020-01-07 DIAGNOSIS — R1013 Epigastric pain: Secondary | ICD-10-CM | POA: Diagnosis not present

## 2020-01-07 NOTE — Patient Instructions (Signed)
This acts like   Gastritis   Generic nexium or prilosec.   30 - 60 minutes before eating .   For  10 - 14 days .    Works  More completely  Than pepcid .    Wt Readings from Last 3 Encounters:  01/07/20 175 lb 3.2 oz (79.5 kg)  10/29/19 173 lb 9.6 oz (78.7 kg)  10/16/19 173 lb 9.6 oz (78.7 kg)    Avoid   Alcohol.   And  Spicy fatty foods .   Let me know how doing in about 2 weeks and if not better   Next step.    Gastritis, Adult Gastritis is inflammation of the stomach. There are two kinds of gastritis:  Acute gastritis. This kind develops suddenly.  Chronic gastritis. This kind is much more common and lasts for a long time. Gastritis happens when the lining of the stomach becomes weak or gets damaged. Without treatment, gastritis can lead to stomach bleeding and ulcers. What are the causes? This condition may be caused by:  An infection.  Drinking too much alcohol.  Certain medicines. These include steroids, antibiotics, and some over-the-counter medicines, such as aspirin or ibuprofen.  Having too much acid in the stomach.  A disease of the intestines or stomach.  Stress.  An allergic reaction.  Crohn's disease.  Some cancer treatments (radiation). Sometimes the cause of this condition is not known. What are the signs or symptoms? Symptoms of this condition include:  Pain or a burning sensation in the upper abdomen.  Nausea.  Vomiting.  An uncomfortable feeling of fullness after eating.  Weight loss.  Bad breath.  Blood in your vomit or stools. In some cases, there are no symptoms. How is this diagnosed? This condition may be diagnosed with:  Your medical history and a description of your symptoms.  A physical exam.  Tests. These can include: ? Blood tests. ? Stool tests. ? A test in which a thin, flexible instrument with a light and a camera is passed down the esophagus and into the stomach (upper endoscopy). ? A test in which a sample of  tissue is taken for testing (biopsy). How is this treated? This condition may be treated with medicines. The medicines that are used vary depending on the cause of the gastritis:  If the condition is caused by a bacterial infection, you may be given antibiotic medicines.  If the condition is caused by too much acid in the stomach, you may be given medicines called H2 blockers, proton pump inhibitors, or antacids. Treatment may also involve stopping the use of certain medicines, such as aspirin, ibuprofen, or other NSAIDs. Follow these instructions at home: Medicines  Take over-the-counter and prescription medicines only as told by your health care provider.  If you were prescribed an antibiotic medicine, take it as told by your health care provider. Do not stop taking the antibiotic even if you start to feel better. Eating and drinking   Eat small, frequent meals instead of large meals.  Avoid foods and drinks that make your symptoms worse.  Drink enough fluid to keep your urine pale yellow. Alcohol use  Do not drink alcohol if: ? Your health care provider tells you not to drink. ? You are pregnant, may be pregnant, or are planning to become pregnant.  If you drink alcohol: ? Limit your use to:  0-1 drink a day for women.  0-2 drinks a day for men. ? Be aware of how much alcohol  is in your drink. In the [place] one drink equals one 12 oz bottle of beer (355 mL), one 5 oz glass of wine (148 mL), or one 1 oz glass of hard liquor (44 mL). General instructions  Talk with your health care provider about ways to manage stress, such as getting regular exercise or practicing deep breathing, meditation, or yoga.  Do not use any products that contain nicotine or tobacco, such as cigarettes and e-cigarettes. If you need help quitting, ask your health care provider.  Keep all follow-up visits as told by your health care provider. This is important. Contact a health care provider  if:  Your symptoms get worse.  Your symptoms return after treatment. Get help right away if:  You vomit blood or material that looks like coffee grounds.  You have black or dark red stools.  You are unable to keep fluids down.  Your abdominal pain gets worse.  You have a fever.  You do not feel better after one week. Summary  Gastritis is inflammation of the lining of the stomach that can occur suddenly (acute) or develop slowly over time (chronic).  This condition is diagnosed with a medical history, a physical exam, or tests.  This condition may be treated with medicines to treat infection or medicines to reduce the amount of acid in your stomach.  Follow your health care provider's instructions about taking medicines, making changes to your diet, and knowing when to call for help. This information is not intended to replace advice given to you by your health care provider. Make sure you discuss any questions you have with your health care provider. Document Revised: 05/10/2017 Document Reviewed: 05/10/2017 Elsevier Patient Education  [place].

## 2020-01-08 LAB — HEPATIC FUNCTION PANEL
ALT: 16 IU/L (ref 0–32)
AST: 20 IU/L (ref 0–40)
Albumin: 4.4 g/dL (ref 3.8–4.8)
Alkaline Phosphatase: 127 IU/L — ABNORMAL HIGH (ref 44–121)
Bilirubin Total: 0.7 mg/dL (ref 0.0–1.2)
Bilirubin, Direct: 0.21 mg/dL (ref 0.00–0.40)
Total Protein: 6.6 g/dL (ref 6.0–8.5)

## 2020-01-08 LAB — LIPID PANEL
Chol/HDL Ratio: 3.1 ratio (ref 0.0–4.4)
Cholesterol, Total: 113 mg/dL (ref 100–199)
HDL: 37 mg/dL — ABNORMAL LOW (ref >39)
LDL Chol Calc (NIH): 61 mg/dL (ref 0–99)
Triglycerides: 70 mg/dL (ref 0–149)
VLDL Cholesterol Cal: 15 mg/dL (ref 5–40)

## 2020-01-10 ENCOUNTER — Other Ambulatory Visit: Payer: Self-pay

## 2020-01-10 ENCOUNTER — Ambulatory Visit
Admission: RE | Admit: 2020-01-10 | Discharge: 2020-01-10 | Disposition: A | Discharge status: Home / Self Care | Payer: Medicare Other | Source: Ambulatory Visit | Attending: Internal Medicine | Admitting: Internal Medicine

## 2020-01-10 DIAGNOSIS — Z1231 Encounter for screening mammogram for malignant neoplasm of breast: Secondary | ICD-10-CM

## 2020-01-11 NOTE — Progress Notes (Signed)
Well controlled cholesterol, normal liver enzyme, alkaline phosphatase is borderline elevated, this is a nonspecific finding. Please repeat liver function test in 2 month to make sure alk phos level return to normal.

## 2020-01-14 ENCOUNTER — Other Ambulatory Visit: Payer: Self-pay

## 2020-01-14 DIAGNOSIS — E785 Hyperlipidemia, unspecified: Secondary | ICD-10-CM

## 2020-01-22 NOTE — Telephone Encounter (Signed)
So since  You are not any better  I advise  GI referral  But stay on the  nexium and can try to take twice a day in interim   Please place/Do   GI referral for  Dyspepsia epigastric burning

## 2020-01-22 NOTE — Telephone Encounter (Signed)
So take the nexium one in am an one in pm before eating      No opinion about the pepcid unless helping you feel better.   I was going to do a referral but since you have seen Dr. Havery Moros in past I will send his team  a message about what is going on and then you make appt with his team .

## 2020-01-23 NOTE — Telephone Encounter (Signed)
Spoke with patient this morning, she has been scheduled for a follow up with Dr. Havery Moros on Friday, 02/01/2020 at 3:20 PM. Patient had no concerns at the end of the call.

## 2020-02-01 ENCOUNTER — Ambulatory Visit (INDEPENDENT_AMBULATORY_CARE_PROVIDER_SITE_OTHER): Payer: Medicare Other | Admitting: Gastroenterology

## 2020-02-01 ENCOUNTER — Other Ambulatory Visit (INDEPENDENT_AMBULATORY_CARE_PROVIDER_SITE_OTHER): Payer: Medicare Other

## 2020-02-01 ENCOUNTER — Other Ambulatory Visit: Payer: Self-pay

## 2020-02-01 VITALS — BP 130/80 | HR 78 | Ht 63.0 in | Wt 175.0 lb

## 2020-02-01 DIAGNOSIS — R1013 Epigastric pain: Secondary | ICD-10-CM | POA: Diagnosis not present

## 2020-02-01 DIAGNOSIS — R6881 Early satiety: Secondary | ICD-10-CM

## 2020-02-01 DIAGNOSIS — K317 Polyp of stomach and duodenum: Secondary | ICD-10-CM

## 2020-02-01 DIAGNOSIS — K219 Gastro-esophageal reflux disease without esophagitis: Secondary | ICD-10-CM | POA: Diagnosis not present

## 2020-02-01 DIAGNOSIS — E785 Hyperlipidemia, unspecified: Secondary | ICD-10-CM

## 2020-02-01 DIAGNOSIS — R748 Abnormal levels of other serum enzymes: Secondary | ICD-10-CM

## 2020-02-01 LAB — CBC WITH DIFFERENTIAL/PLATELET
Basophils Absolute: 0 10*3/uL (ref 0.0–0.1)
Basophils Relative: 0.9 % (ref 0.0–3.0)
Eosinophils Absolute: 0.8 10*3/uL — ABNORMAL HIGH (ref 0.0–0.7)
Eosinophils Relative: 14 % — ABNORMAL HIGH (ref 0.0–5.0)
HCT: 41.3 % (ref 36.0–46.0)
Hemoglobin: 14.1 g/dL (ref 12.0–15.0)
Lymphocytes Relative: 37.6 % (ref 12.0–46.0)
Lymphs Abs: 2 10*3/uL (ref 0.7–4.0)
MCHC: 34.1 g/dL (ref 30.0–36.0)
MCV: 87.8 fl (ref 78.0–100.0)
Monocytes Absolute: 0.5 10*3/uL (ref 0.1–1.0)
Monocytes Relative: 10.1 % (ref 3.0–12.0)
Neutro Abs: 2 10*3/uL (ref 1.4–7.7)
Neutrophils Relative %: 37.4 % — ABNORMAL LOW (ref 43.0–77.0)
Platelets: 346 10*3/uL (ref 150.0–400.0)
RBC: 4.7 Mil/uL (ref 3.87–5.11)
RDW: 12.9 % (ref 11.5–15.5)
WBC: 5.4 10*3/uL (ref 4.0–10.5)

## 2020-02-01 LAB — HEPATIC FUNCTION PANEL
ALT: 17 U/L (ref 0–35)
AST: 16 U/L (ref 0–37)
Albumin: 4.3 g/dL (ref 3.5–5.2)
Alkaline Phosphatase: 81 U/L (ref 39–117)
Bilirubin, Direct: 0.2 mg/dL (ref 0.0–0.3)
Total Bilirubin: 0.6 mg/dL (ref 0.2–1.2)
Total Protein: 7 g/dL (ref 6.0–8.3)

## 2020-02-01 LAB — GAMMA GT: GGT: 13 U/L (ref 7–51)

## 2020-02-01 LAB — LIPASE: Lipase: 29 U/L (ref 11.0–59.0)

## 2020-02-01 MED ORDER — SUCRALFATE 1 G PO TABS: 1.0000 g | ORAL_TABLET | Freq: Four times a day (QID) | ORAL | 1 refills | Status: DC | PRN

## 2020-02-01 NOTE — Patient Instructions (Addendum)
If you are age 68 or older, your body mass index should be between 23-30. Your Body mass index is 31 kg/m. If this is out of the aforementioned range listed, please consider follow up with your Primary Care Provider.  If you are age 52 or younger, your body mass index should be between 19-25. Your Body mass index is 31 kg/m. If this is out of the aformentioned range listed, please consider follow up with your Primary Care Provider.   You have been scheduled for an endoscopy. Please follow written instructions given to you at your visit today. If you use inhalers (even only as needed), please bring them with you on the day of your procedure.  Increase Nexium to twice a day.  Continue Pepcid.  We have sent the following medications to your pharmacy for you to pick up at your convenience: Carafate tablets: Take 1 tablet by mouth every 6 hours as needed  Please go to the lab in the basement of our building to have lab work done as you leave today. Hit "B" for basement when you get on the elevator.  When the doors open the lab is on your left.  We will call you with the results. Thank you.   Thank you for entrusting me with your care and for choosing [facility] Dr. Cooke Cellar

## 2020-02-01 NOTE — Progress Notes (Signed)
HPI :  70 year old female here for a follow-up visit for epigastric pain, GERD.  She states she was in her usual state of health however just around Christmas time she states she ate some pork which did not sit well with her.  Ever since then she has had discomfort in her upper abdomen.  She states initially this was rather severe, felt it mostly in her right upper quadrant to epigastric area.  She states she felt as though something was "stuck" in her bowel.  She had significant belching and bloating with a decreased appetite and early satiety.  She also endorsed worsening reflux symptoms with burning in her epigastric and chest area.  She returned from [place] and took some Nexium 20 mg a day after 2 weeks but this really did not provide much benefit.  Her primary care told her to increase the dose to twice daily but she never did that.  She did seek some evaluation with acupuncture which she is not sure if that helps much.  She has been taking Pepcid 20 mg at night and Nexium 20 mg in the morning.  She states over time things have slightly improved but she remains rather symptomatic.  She is quite frustrated that she can eat what she wants to.  Eating clearly makes her feel worse in regards to the symptoms and she is not been able to eat too much.  She denies any frank nausea or vomiting.  She has had some mid back pain with this.  No blood in her stool.  She has had a lot of heartburn as outlined above and this has not really got much better either.  She has not been using any routine NSAIDs but did take a dose of Duexis yesterday.  She had some lab work done on January 4 showing an elevated alk phos of 127 which is new for her, AST and ALT were normal.  She has a history of cholecystectomy.  She states she has rarely had occasional mild symptoms like this in the past but nothing that has been persistent with this severe.  She has had a history of an EGD with me in the past for reflux.  She was noted  to have some enlarged benign-appearing polyps in the fundus which I attempted to remove at the hospital however this was complicated by bleeding.  She was referred to [facility], Dr. Arsenio Loader did an EGD for her in 2018 and thought she had benign fundic gland polyps, does not sound like these were entirely resected, partially were resected. She is going on a trip to [facility] in 2 weeks and states she wants to be able to eat drink which she wants to enjoy this trip, her current symptoms are really limiting her.  She otherwise had a colonoscopy with me in October of last year which did not show any high risk lesions, no adenomas.  Prior workup: Colonoscopy 07/11/2009 - left sided diverticulosis, no polyps  EGD 10/08/2015 -  - A 3 cm hiatal hernia was present. - LA Grade A (one or more mucosal breaks less than 5 mm, not extending between tops of 2 mucosal folds) esophagitis was found at the GEJ. No evidence of Barrett's esophagus. - The exam of the esophagus was otherwise normal. - A few 5 to 8 mm sessile polyps were found at the pylorus. Biopsies were taken with a cold forceps for histology to ensure no adenomatous change. - Multiple 6 mm sessile polyps were found  in the gastric body. Biopsies were taken with a cold forceps for histology to ensure no adenomatous changes, suspect fundic gland polyps. - Two suspected large sessile polyps were found in the gastric fundus. They appeared to most likely be hyperplastic, however one had potential adenomatous changes in one area (labelled fundus polyp #1). Both lesions were biopsied with a cold forceps for histology to ensure no adenomatous change. - The exam of the stomach was otherwise normal. - The duodenal bulb and second portion of the duodenum were normal.   EGD 12/09/2015- Esophagogastric landmarks identified. - 3 cm hiatal hernia. - Two large gastric polyps in the fundus. Incomplete resection as outlined above due to bleeding. Resected tissue  retrieved. Clips were placed for hemostasis. - Multiple smaller benign appearing gastric polyps. - Normal duodenal bulb and second portion of the duodenum.  Colonoscopy 10/29/19 - The perianal and digital rectal examinations were normal. - The terminal ileum appeared normal. - A 3 mm polyp was found in the descending colon. The polyp was sessile. The polyp was removed with a cold snare. Resection and retrieval were complete. - A 3 mm polyp was found in the sigmoid colon. The polyp was sessile. The polyp was removed with a cold snare. Resection and retrieval were complete. - Multiple small-mouthed diverticula were found in the sigmoid colon. - Internal hemorrhoids were found during retroflexion. The hemorrhoids were small. - The exam was otherwise without abnormality.  Surgical [P], colon, sigmoid and descending, polyp (2) - HYPERPLASTIC POLYP (TWO). - NO ADENOMATOUS CHANGE OR CARCINOMA.   EGD Dr. Arsenio Loader 02/02/2016 - "fundic gland polyp" on path, no report available   Past Medical History:  Diagnosis Date  . Allergy   . Arthritis   . GERD (gastroesophageal reflux disease)   . Hx of varicella   . Hyperlipidemia    borderline  . Hypertension   . Hypothyroidism   . Pneumonia 11/2014   Only time took inhalers to treat  . RBBB 10/2019   recently seen at [facility] - f/u 6 mons, no treatment  . Shingles 10/26/2010  . Syncope and collapse 10/2019   Unclear etiology - just saw MD - f/u 6 months - no treatment  . Thyroid disease      Past Surgical History:  Procedure Laterality Date  . CESAREAN SECTION     x1  . CHOLECYSTECTOMY    . COLONOSCOPY  2011   tics, no polyps - recall 5 yr  . ESOPHAGOGASTRODUODENOSCOPY (EGD) WITH PROPOFOL N/A 12/09/2015   Procedure: ESOPHAGOGASTRODUODENOSCOPY (EGD) WITH PROPOFOL;  Surgeon: Manus Gunning, MD;  Location: [facility];  Service: Gastroenterology;  Laterality: N/A;  . EXPLORATORY LAPAROTOMY     Gallbladder  . POLYPECTOMY N/A 12/09/2015    Procedure: POLYPECTOMY;  Surgeon: Manus Gunning, MD;  Location: [facility];  Service: Gastroenterology;  Laterality: N/A;  . TUBAL LIGATION    . WISDOM TOOTH EXTRACTION     Family History  Problem Relation Age of Onset  . Heart disease Mother        stent in 59 s  . COPD Father   . Heart disease Father        cabg in 72s   . Glaucoma Father   . Dementia Father   . Ulcerative colitis Sister   . Ulcerative colitis Brother   . Arrhythmia Daughter   . Colon cancer Cousin   . Colon polyps Neg Hx   . Esophageal cancer Neg Hx   . Rectal cancer Neg Hx   .  Stomach cancer Neg Hx    Social History   Tobacco Use  . Smoking status: Former Smoker    Types: Cigarettes    Quit date: 01/04/1982    Years since quitting: 38.1  . Smokeless tobacco: Never Used  . Tobacco comment: Marijuana daily  Vaping Use  . Vaping Use: Former  Substance Use Topics  . Alcohol use: Yes    Alcohol/week: 14.0 standard drinks    Types: 14 Standard drinks or equivalent per week    Comment: daily- 2 a night   . Drug use: Yes    Types: Marijuana    Comment: Last use 10/15/19- Marijuana Daily   Current Outpatient Medications  Medication Sig Dispense Refill  . albuterol (VENTOLIN HFA) 108 (90 Base) MCG/ACT inhaler TAKE 2 PUFFS BY MOUTH EVERY 6 HOURS AS NEEDED FOR WHEEZE OR SHORTNESS OF BREATH 6.7 each 1  . calcium carbonate (OS-CAL) 600 MG TABS tablet     . Cholecalciferol (VITAMIN D PO) Take 2 tablets by mouth daily.    . DUEXIS 800-26.6 MG TABS Take 1 tablet by mouth daily as needed.  2  . ELDERBERRY PO daily.     [name] esomeprazole (NEXIUM) 20 MG capsule Take 20 mg by mouth daily at 12 noon.    . famotidine (PEPCID) 20 MG tablet Take 20 mg by mouth as needed for heartburn or indigestion.    . hydrochlorothiazide (MICROZIDE) 12.5 MG capsule TAKE 1 CAPSULE BY MOUTH EVERY DAY 90 capsule 2  . KRILL OIL PO Take by mouth in the morning and at bedtime.    [name] levothyroxine (SYNTHROID) 50 MCG tablet Take 1  tablet (50 mcg total) by mouth daily. 90 tablet 1  . losartan (COZAAR) 25 MG tablet TAKE 2 TABLETS (50 MG TOTAL) BY MOUTH DAILY. FOR HIGH BLOOD PRESSURE 180 tablet 1  . Misc Natural Products (OSTEO BI-FLEX/5-LOXIN ADVANCED PO) Take by mouth 2 (two) times daily at 8 am and 10 pm.    . Multiple Vitamin (MULTIVITAMIN WITH MINERALS) TABS tablet Take 1 tablet by mouth daily.     [name] OVER THE COUNTER MEDICATION Take 1 capsule by mouth daily.     . vitamin A 3 MG (10000 UNITS) capsule Take 10,000 Units by mouth daily.    [name] atorvastatin (LIPITOR) 20 MG tablet Take 1 tablet (20 mg total) by mouth daily. 90 tablet 3   No current facility-administered medications for this visit.   Allergies  Allergen Reactions  . Levaquin [Levofloxacin]     See notes  September 2019  Palpitations irritability sleep issues       Review of Systems: All systems reviewed and negative except where noted in HPI.   Labs reviewed in Epic  Physical Exam: BP 130/80   Pulse 78   Ht 5' 3"  (1.6 m)   Wt 175 lb (79.4 kg)   LMP  (LMP Unknown)   SpO2 99%   BMI 31.00 kg/m  Constitutional: Pleasant,well-developed, female in no acute distress. Abdominal: Soft, nondistended, nontender.  There are no masses palpable.  Extremities: no edema Lymphadenopathy: No cervical adenopathy noted. Neurological: Alert and oriented to person place and time. Skin: Skin is warm and dry. No rashes noted. Psychiatric: Normal mood and affect. Behavior is normal.   ASSESSMENT AND PLAN: 70 year old female here for reassessment of following:  Epigastric pain / Dyspepsia Early satiety GERD History of gastric polyp Elevated alkaline phosphatase  History as outlined above, roughly 4-week duration of significant dyspepsia/epigastric pain made worse with eating,  early satiety, GERD. She is s/p cholecystectomy. Over time things have slowly improved but remains significantly symptomatic.  Low-dose Nexium has not provided too much benefit yet and  she has been taking Pepcid at night as well.  Discussed differential diagnosis with her.  I will send her to the lab to make sure lipase and LFTs are stable, will also send GGT in case her alk phos remains elevated, as well as CBC to assess for leukocytosis etc.  Unclear if she has developed peptic ulcer disease/gastritis, gastric outlet obstruction or obstruction further distally, gastroparesis, etc. we will make sure lipase is normal assess for pancreatitis.  Discussed options to further evaluate this.  I offered her an upper endoscopy to clear her upper tract and make sure things okay, especially with history of large gastric polyps although those were determined to be fundic gland in etiology and benign.  Following discussion of risk and benefits of EGD she want to proceed.  In the interim I asked her to increase her Nexium to twice daily and will try her on some Carafate tablets as needed to see if that provides any benefit given her prandial association.  If her symptoms persist despite this and EGD is negative, we will need to consider cross-sectional imaging with a CT scan.  She agreed with the plan as outlined. She should avoid NSAIDs otherwise.  Plan: - labs today - CBC, CMET, lipase, GGT - increase nexium to BID - continue pepcid - start carafate every 6 hours PRN  - EGD next week - consideration for cross sectional imaging pending her course.    [place] Cellar, MD [facility]

## 2020-02-07 ENCOUNTER — Other Ambulatory Visit: Payer: Self-pay

## 2020-02-07 ENCOUNTER — Encounter: Payer: Self-pay | Admitting: Gastroenterology

## 2020-02-07 ENCOUNTER — Ambulatory Visit (AMBULATORY_SURGERY_CENTER): Payer: Medicare Other | Admitting: Gastroenterology

## 2020-02-07 VITALS — BP 133/71 | HR 65 | Temp 97.1°F | Resp 21 | Ht 63.0 in | Wt 175.0 lb

## 2020-02-07 DIAGNOSIS — K449 Diaphragmatic hernia without obstruction or gangrene: Secondary | ICD-10-CM | POA: Diagnosis not present

## 2020-02-07 DIAGNOSIS — K317 Polyp of stomach and duodenum: Secondary | ICD-10-CM

## 2020-02-07 DIAGNOSIS — K2970 Gastritis, unspecified, without bleeding: Secondary | ICD-10-CM | POA: Diagnosis not present

## 2020-02-07 DIAGNOSIS — R6881 Early satiety: Secondary | ICD-10-CM

## 2020-02-07 DIAGNOSIS — K219 Gastro-esophageal reflux disease without esophagitis: Secondary | ICD-10-CM

## 2020-02-07 DIAGNOSIS — R1013 Epigastric pain: Secondary | ICD-10-CM

## 2020-02-07 DIAGNOSIS — K2950 Unspecified chronic gastritis without bleeding: Secondary | ICD-10-CM | POA: Diagnosis not present

## 2020-02-07 MED ORDER — SODIUM CHLORIDE 0.9 % IV SOLN
500.0000 mL | Freq: Once | INTRAVENOUS | Status: DC
Start: 2020-02-07 — End: 2020-02-07

## 2020-02-07 NOTE — Op Note (Signed)
[place] Patient Name: Jillian Escobar Procedure Date: 02/07/2020 2:24 PM MRN: [id number] Endoscopist: Remo Lipps P. Havery Moros , MD Age: 70 Referring MD:  Date of Birth: [birth year] Gender: Female Account #: [id number] Procedure:                Upper GI endoscopy Indications:              Dyspepsia, Follow-up of gastro-esophageal reflux                            disease, Early satiety - improved on nexium twice                            daily and carafate over the past few days but not                            resolved Medicines:                Monitored Anesthesia Care Procedure:                Pre-Anesthesia Assessment:                           - Prior to the procedure, a History and Physical                            was performed, and patient medications and                            allergies were reviewed. The patient's tolerance of                            previous anesthesia was also reviewed. The risks                            and benefits of the procedure and the sedation                            options and risks were discussed with the patient.                            All questions were answered, and informed consent                            was obtained. Prior Anticoagulants: The patient has                            taken no previous anticoagulant or antiplatelet                            agents. ASA Grade Assessment: II - A patient with                            mild systemic disease. After reviewing the risks  and benefits, the patient was deemed in                            satisfactory condition to undergo the procedure.                           After obtaining informed consent, the endoscope was                            passed under direct vision. Throughout the                            procedure, the patient's blood pressure, pulse, and                            oxygen saturations were monitored  continuously. The                            Endoscope was introduced through the mouth, and                            advanced to the second part of duodenum. The upper                            GI endoscopy was accomplished without difficulty.                            The patient tolerated the procedure well. Scope In: Scope Out: Findings:                 Esophagogastric landmarks were identified: the                            Z-line was found at 33 cm, the gastroesophageal                            junction was found at 33 cm and the upper extent of                            the gastric folds was found at 36 cm from the                            incisors.                           A 3 cm hiatal hernia was present.                           The exam of the esophagus was otherwise normal.                           Localized prominent gastric folds were found in the  gastric fundus, benign appearing. Biopsies were                            taken with a cold forceps for histology.                           Diffuse mildly erythematous mucosa was found in the                            gastric body without focal ulceration.                           The exam of the stomach was otherwise normal.                           Biopsies were taken with a cold forceps in the                            gastric body, at the incisura and in the gastric                            antrum for Helicobacter pylori testing.                           The duodenal bulb and second portion of the                            duodenum were normal. Complications:            No immediate complications. Estimated blood loss:                            Minimal. Estimated Blood Loss:     Estimated blood loss was minimal. Impression:               - Esophagogastric landmarks identified.                           - 3 cm hiatal hernia.                           - Normal esophagus  otherwise - no erosive                            esophagitis or Barrett's                           - Enlarged proximal gastric folds, benign                            appearing. Biopsied.                           - Erythematous mucosa in the gastric body.                           -  Normal stomach otherwise - no outlet obstruction,                            biopsies taken to rule out H pylori                           - Normal duodenal bulb and second portion of the                            duodenum. Recommendation:           - Patient has a contact number available for                            emergencies. The signs and symptoms of potential                            delayed complications were discussed with the                            patient. Return to normal activities tomorrow.                            Written discharge instructions were provided to the                            patient.                           - Resume previous diet.                           - Continue present medications.                           - Continue nexium twice daily and carafate PRN if                            that has helped so far                           - Consider trial of FD gard OTC for dyspepsia and                            see if that helps                           - Await pathology results with further                            recommendations Remo Lipps P. Havery Moros, MD 02/07/2020 2:55:59 PM This report has been signed electronically.

## 2020-02-07 NOTE — Progress Notes (Addendum)
Pt was instructed to continue taking NEXIUM twice daily and CARAFATE as needed if helping sx and also to add over the counter FD Donald Prose wich is a probotic that is over the counter to try and see if improves sx.    Pt reported she was taking a probiotic she purchased at [employer].  Per Dr. Havery Moros discontinue that and try FD Donald Prose. maw  No problems noted in the recovery room. maw

## 2020-02-07 NOTE — Progress Notes (Signed)
Called to room to assist during endoscopic procedure.  Patient ID and intended procedure confirmed with present staff. Received instructions for my participation in the procedure from the performing physician.  

## 2020-02-07 NOTE — Progress Notes (Signed)
A/ox3, pleased with MAC, report to RN 

## 2020-02-07 NOTE — Patient Instructions (Addendum)
Handouts were given to you on a hiatal hernia & GERD. Continue NEXIUM twice daily and CARAFATE as needed if that has helped so far per Dr. Havery Moros. Also ad FD Donald Prose -  is a probiotic that is over the counter for symptoms and see if this helps. You may resume your current medications today. Await biopsy results.  May take 1-3 to receive pathology results. Please call if any questions or concerns.    YOU HAD AN ENDOSCOPIC PROCEDURE TODAY AT [facility]:   Refer to the procedure report that was given to you for any specific questions about what was found during the examination.  If the procedure report does not answer your questions, please call your gastroenterologist to clarify.  If you requested that your care partner not be given the details of your procedure findings, then the procedure report has been included in a sealed envelope for you to review at your convenience later.  YOU SHOULD EXPECT: Some feelings of bloating in the abdomen. Passage of more gas than usual.  Walking can help get rid of the air that was put into your GI tract during the procedure and reduce the bloating. If you had a lower endoscopy (such as a colonoscopy or flexible sigmoidoscopy) you may notice spotting of blood in your stool or on the toilet paper. If you underwent a bowel prep for your procedure, you may not have a normal bowel movement for a few days.  Please Note:  You might notice some irritation and congestion in your nose or some drainage.  This is from the oxygen used during your procedure.  There is no need for concern and it should clear up in a day or so.  SYMPTOMS TO REPORT IMMEDIATELY:    Following upper endoscopy (EGD)  Vomiting of blood or coffee ground material  New chest pain or pain under the shoulder blades  Painful or persistently difficult swallowing  New shortness of breath  Fever of 100F or higher  Black, tarry-looking stools  For urgent or emergent issues, a  gastroenterologist can be reached at any hour by calling [phone]. Do not use MyChart messaging for urgent concerns.    DIET:  We do recommend a small meal at first, but then you may proceed to your regular diet.  Drink plenty of fluids but you should avoid alcoholic beverages for 24 hours.  ACTIVITY:  You should plan to take it easy for the rest of today and you should NOT DRIVE or use heavy machinery until tomorrow (because of the sedation medicines used during the test).    FOLLOW UP: Our staff will call the number listed on your records 48-72 hours following your procedure to check on you and address any questions or concerns that you may have regarding the information given to you following your procedure. If we do not reach you, we will leave a message.  We will attempt to reach you two times.  During this call, we will ask if you have developed any symptoms of COVID 19. If you develop any symptoms (ie: fever, flu-like symptoms, shortness of breath, cough etc.) before then, please call [phone].  If you test positive for Covid 19 in the 2 weeks post procedure, please call and report this information to [place].    If any biopsies were taken you will be contacted by phone or by letter within the next 1-3 weeks.  Please call us at [phone] if you have not heard about the  biopsies in 3 weeks.    SIGNATURES/CONFIDENTIALITY: You and/or your care partner have signed paperwork which will be entered into your electronic medical record.  These signatures attest to the fact that that the information above on your After Visit Summary has been reviewed and is understood.  Full responsibility of the confidentiality of this discharge information lies with you and/or your care-partner.

## 2020-02-11 ENCOUNTER — Telehealth: Payer: Self-pay

## 2020-02-11 NOTE — Telephone Encounter (Signed)
LVM

## 2020-03-03 ENCOUNTER — Telehealth: Payer: Self-pay | Admitting: Internal Medicine

## 2020-03-03 NOTE — Telephone Encounter (Signed)
Left message for patient to call back and schedule Medicare Annual Wellness Visit (AWV) either virtually or in office. No detailed message left   AWVI  please schedule at anytime with [facility] Nurse Health Advisor 1 or 2   This should be a 45 minute visit. 

## 2020-03-06 ENCOUNTER — Other Ambulatory Visit: Payer: Self-pay | Admitting: Internal Medicine

## 2020-03-10 ENCOUNTER — Other Ambulatory Visit: Payer: Self-pay

## 2020-03-10 DIAGNOSIS — E785 Hyperlipidemia, unspecified: Secondary | ICD-10-CM

## 2020-03-10 LAB — HEPATIC FUNCTION PANEL
ALT: 27 IU/L (ref 0–32)
AST: 32 IU/L (ref 0–40)
Albumin: 4.2 g/dL (ref 3.8–4.8)
Alkaline Phosphatase: 94 IU/L (ref 44–121)
Bilirubin Total: 0.8 mg/dL (ref 0.0–1.2)
Bilirubin, Direct: 0.25 mg/dL (ref 0.00–0.40)
Total Protein: 6.7 g/dL (ref 6.0–8.5)

## 2020-03-18 NOTE — Progress Notes (Signed)
Subjective:   Jillian Escobar is a 70 y.o. female who presents for an Initial Medicare Annual Wellness Visit.  I connected with Tane Biegler today by telephone and verified that I am speaking with the correct person using two identifiers. Location patient: home Location provider: work Persons participating in the virtual visit: patient, provider.   I discussed the limitations, risks, security and privacy concerns of performing an evaluation and management service by telephone and the availability of in person appointments. I also discussed with the patient that there may be a patient responsible charge related to this service. The patient expressed understanding and verbally consented to this telephonic visit.    Interactive audio and video telecommunications were attempted between this provider and patient, however failed, due to patient having technical difficulties OR patient did not have access to video capability.  We continued and completed visit with audio only.      Review of Systems    N/A  Cardiac Risk Factors include: advanced age (>32men, >49 women)     Objective:    Today's Vitals   There is no height or weight on file to calculate BMI.  Advanced Directives 03/19/2020 10/17/2017 12/09/2015 10/08/2015 01/05/2015  Does Patient Have a Medical Advance Directive? Yes Yes Yes Yes Yes  Type of Paramedic of [place];Living will Living will;Healthcare Power of [place];Living will [place];Living will [place];Living will  Does patient want to make changes to medical advance directive? No - Patient declined - - - -  Copy of [place] in Chart? No - copy requested No - copy requested Yes - -    Current Medications (verified) Outpatient Encounter Medications as of 03/19/2020  Medication Sig  . atorvastatin (LIPITOR) 20 MG tablet Take 1 tablet (20 mg total) by mouth  daily.  . calcium carbonate (OS-CAL) 600 MG TABS tablet   . Cholecalciferol (VITAMIN D PO) Take 2 tablets by mouth daily.  . DUEXIS 800-26.6 MG TABS Take 1 tablet by mouth daily as needed.  [name] ELDERBERRY PO daily.   [name] esomeprazole (NEXIUM) 20 MG capsule Take 20 mg by mouth 2 (two) times daily.  . famotidine (PEPCID) 20 MG tablet Take 20 mg by mouth as needed for heartburn or indigestion.  . hydrochlorothiazide (MICROZIDE) 12.5 MG capsule TAKE 1 CAPSULE BY MOUTH EVERY DAY  . KRILL OIL PO Take by mouth in the morning and at bedtime.  [name] levothyroxine (SYNTHROID) 50 MCG tablet TAKE 1 TABLET BY MOUTH EVERY DAY  . losartan (COZAAR) 25 MG tablet TAKE 2 TABLETS (50 MG TOTAL) BY MOUTH DAILY. FOR HIGH BLOOD PRESSURE  . Misc Natural Products (OSTEO BI-FLEX/5-LOXIN ADVANCED PO) Take by mouth 2 (two) times daily at 8 am and 10 pm.  . Multiple Vitamin (MULTIVITAMIN WITH MINERALS) TABS tablet Take 1 tablet by mouth daily.   . vitamin A 3 MG (10000 UNITS) capsule Take 10,000 Units by mouth daily.  [name] albuterol (VENTOLIN HFA) 108 (90 Base) MCG/ACT inhaler TAKE 2 PUFFS BY MOUTH EVERY 6 HOURS AS NEEDED FOR WHEEZE OR SHORTNESS OF BREATH (Patient not taking: No sig reported)  . OVER THE COUNTER MEDICATION Take 1 capsule by mouth daily.  (Patient not taking: Reported on 03/19/2020)  . sucralfate (CARAFATE) 1 g tablet Take 1 tablet (1 g total) by mouth every 6 (six) hours as needed. (Patient not taking: Reported on 03/19/2020)   No facility-administered encounter medications on file as of  03/19/2020.    Allergies (verified) Levaquin [levofloxacin]   History: Past Medical History:  Diagnosis Date  . Allergy   . Arthritis   . GERD (gastroesophageal reflux disease)   . Hx of varicella   . Hyperlipidemia    borderline  . Hypertension   . Hypothyroidism   . Pneumonia 11/2014   Only time took inhalers to treat  . RBBB 10/2019   recently seen at MD - f/u 6 mons, no treatment  . Shingles 10/26/2010  . Syncope  and collapse 10/2019   Unclear etiology - just saw MD - f/u 6 months - no treatment  . Thyroid disease    Past Surgical History:  Procedure Laterality Date  . CESAREAN SECTION     x1  . CHOLECYSTECTOMY    . COLONOSCOPY  2011   tics, no polyps - recall 5 yr  . ESOPHAGOGASTRODUODENOSCOPY (EGD) WITH PROPOFOL N/A 12/09/2015   Procedure: ESOPHAGOGASTRODUODENOSCOPY (EGD) WITH PROPOFOL;  Surgeon: Manus Gunning, MD;  Location: [facility];  Service: Gastroenterology;  Laterality: N/A;  . EXPLORATORY LAPAROTOMY     Gallbladder  . POLYPECTOMY N/A 12/09/2015   Procedure: POLYPECTOMY;  Surgeon: Manus Gunning, MD;  Location: [facility];  Service: Gastroenterology;  Laterality: N/A;  . TUBAL LIGATION    . WISDOM TOOTH EXTRACTION     Family History  Problem Relation Age of Onset  . Heart disease Mother        stent in 8 s  . COPD Father   . Heart disease Father        cabg in 13s   . Glaucoma Father   . Dementia Father   . Ulcerative colitis Sister   . Ulcerative colitis Brother   . Arrhythmia Daughter   . Colon cancer Cousin   . Colon polyps Neg Hx   . Esophageal cancer Neg Hx   . Rectal cancer Neg Hx   . Stomach cancer Neg Hx    Social History   Socioeconomic History  . Marital status: Married    Spouse name: Not on file  . Number of children: 1  . Years of education: Not on file  . Highest education level: Not on file  Occupational History  . Not on file  Tobacco Use  . Smoking status: Former Smoker    Types: Cigarettes    Quit date: 01/04/1982    Years since quitting: 38.2  . Smokeless tobacco: Never Used  . Tobacco comment: Marijuana daily  Vaping Use  . Vaping Use: Former  Substance and Sexual Activity  . Alcohol use: Yes    Alcohol/week: 14.0 standard drinks    Types: 14 Standard drinks or equivalent per week    Comment: daily- 2 a night   . Drug use: Yes    Types: Marijuana    Comment: Last use 10/15/19- Marijuana Daily  . Sexual activity:  Yes    Birth control/protection: Post-menopausal  Other Topics Concern  . Not on file  Social History Narrative   H H of 2    Married, G2P1   Former tobacco;drinks beer wine   Walks maybe 30 minutes or more days a week. Doesn't do more than that because she is "lazy"   Pet 2 dog and 2 cats .   TA [place] [employer]   Originally from [place], moved over 20 years ago   Social Determinants of Radio broadcast assistant Strain: Low Risk   . Difficulty of Paying  Living Expenses: Not hard at all  Food Insecurity: No Food Insecurity  . Worried About Charity fundraiser in the Last Year: Never true  . Ran Out of Food in the Last Year: Never true  Transportation Needs: No Transportation Needs  . Lack of Transportation (Medical): No  . Lack of Transportation (Non-Medical): No  Physical Activity: Sufficiently Active  . Days of Exercise per Week: 7 days  . Minutes of Exercise per Session: 30 min  Stress: No Stress Concern Present  . Feeling of Stress : Not at all  Social Connections: Moderately Isolated  . Frequency of Communication with Friends and Family: More than three times a week  . Frequency of Social Gatherings with Friends and Family: More than three times a week  . Attends Religious Services: Never  . Active Member of Clubs or Organizations: No  . Attends Archivist Meetings: Never  . Marital Status: Married    Tobacco Counseling Counseling given: Not Answered Comment: Marijuana daily   Clinical Intake:  Pre-visit preparation completed: Yes        Nutritional Risks: None Diabetes: No  How often do you need to have someone help you when you read instructions, pamphlets, or other written materials from your doctor or pharmacy?: 1 - Never  Diabetic?No      Information entered by :: [place] of Daily Living In your present state of health, do you have any difficulty performing the following activities: 03/19/2020   Hearing? N  Vision? N  Difficulty concentrating or making decisions? N  Walking or climbing stairs? N  Dressing or bathing? N  Doing errands, shopping? N  Preparing Food and eating ? N  Using the Toilet? N  In the past six months, have you accidently leaked urine? N  Do you have problems with loss of bowel control? N  Managing your Medications? N  Managing your Finances? N  Housekeeping or managing your Housekeeping? N  Some recent data might be hidden    Patient Care Team: Panosh, Standley Brooking, MD as PCP - General Leonie Man, MD as PCP - Cardiology (Cardiology)  Indicate any recent Medical Services you may have received from other than [facility] providers in the past year (date may be approximate).     Assessment:   This is a routine wellness examination for Jillian Escobar.  Hearing/Vision screen  Hearing Screening   125Hz  250Hz  500Hz  1000Hz  2000Hz  3000Hz  4000Hz  6000Hz  8000Hz   Right ear:           Left ear:           Vision Screening Comments: Gets eyes examined once per year. Wears glasses   Dietary issues and exercise activities discussed: Current Exercise Habits: Home exercise routine, Type of exercise: walking, Time (Minutes): 40, Frequency (Times/Week): 7, Weekly Exercise (Minutes/Week): 280, Intensity: Moderate, Exercise limited by: None identified  Goals   None    Depression Screen PHQ 2/9 Scores 03/19/2020 01/07/2020 08/02/2018 05/26/2016 08/24/2012 08/21/2012  PHQ - 2 Score 0 0 0 0 0 0    Fall Risk Fall Risk  03/19/2020 02/05/2019 05/26/2016 08/24/2012 08/21/2012  Falls in the past year? 0 0 Yes No No  Number falls in past yr: 0 0 2 or more - -  Injury with Fall? 0 0 Yes - -  Risk for fall due to : No Fall Risks - - - -  Follow up Falls evaluation completed;Falls prevention discussed Falls evaluation completed - - -  FALL RISK PREVENTION PERTAINING TO THE HOME:  Any stairs in or around the home? Yes  If so, are there any without handrails? No  Home free of loose throw  rugs in walkways, pet beds, electrical cords, etc? Yes  Adequate lighting in your home to reduce risk of falls? Yes   ASSISTIVE DEVICES UTILIZED TO PREVENT FALLS:  Life alert? No  Use of a cane, walker or w/c? No  Grab bars in the bathroom? Yes  Shower chair or bench in shower? Yes  Elevated toilet seat or a handicapped toilet? Yes     Cognitive Function:   Normal cognitive status assessed by direct observation by this Nurse Health Advisor. No abnormalities found.        Immunizations Immunization History  Administered Date(s) Administered  . Fluad Quad(high Dose 65+) 08/30/2018  . Hep A / Hep B 02/28/2009, 03/28/2009  . Hepatitis A 08/29/2009  . Hepatitis B 07/31/2009  . Influenza, High Dose Seasonal PF 10/31/2017  . Influenza-Unspecified 10/02/2019, 10/02/2019  . Meningococcal Polysaccharide 02/28/2009  . PFIZER(Purple Top)SARS-COV-2 Vaccination 01/26/2019, 02/16/2019, 10/02/2019  . Pneumococcal Conjugate-13 08/02/2018  . Pneumococcal Polysaccharide-23 08/21/2019  . Td 01/04/2002, 02/28/2009    TDAP status: Due, Education has been provided regarding the importance of this vaccine. Advised may receive this vaccine at local pharmacy or Health Dept. Aware to provide a copy of the vaccination record if obtained from local pharmacy or Health Dept. Verbalized acceptance and understanding.  Flu Vaccine status: Up to date  Pneumococcal vaccine status: Up to date  Covid-19 vaccine status: Completed vaccines  Qualifies for Shingles Vaccine? Yes   Zostavax completed No   Shingrix Completed?: No.    Education has been provided regarding the importance of this vaccine. Patient has been advised to call insurance company to determine out of pocket expense if they have not yet received this vaccine. Advised may also receive vaccine at local pharmacy or Health Dept. Verbalized acceptance and understanding.  Screening Tests Health Maintenance  Topic Date Due  . Hepatitis C Screening   Never done  . TETANUS/TDAP  03/01/2019  . MAMMOGRAM  01/09/2021  . COLONOSCOPY (Pts 45-13yrs Insurance coverage will need to be confirmed)  10/28/2029  . INFLUENZA VACCINE  Completed  . DEXA SCAN  Completed  . COVID-19 Vaccine  Completed  . PNA vac Low Risk Adult  Completed  . HPV VACCINES  Aged Out    Health Maintenance  Health Maintenance Due  Topic Date Due  . Hepatitis C Screening  Never done  . TETANUS/TDAP  03/01/2019    Colorectal cancer screening: Type of screening: Colonoscopy. Completed 10/29/2019. Repeat every 10 years  Mammogram status: Completed 01/10/2020. Repeat every year  Bone Density status: Completed 02/19/2019. Results reflect: Bone density results: OSTEOPENIA. Repeat every 5 years.  Lung Cancer Screening: (Low Dose CT Chest recommended if Age 39-80 years, 30 pack-year currently smoking OR have quit w/in 15years.) does not qualify.   Lung Cancer Screening Referral: N/A   Additional Screening:  Hepatitis C Screening: does qualify;   Vision Screening: Recommended annual ophthalmology exams for early detection of glaucoma and other disorders of the eye. Is the patient up to date with their annual eye exam?  Yes  Who is the provider or what is the name of the office in which the patient attends annual eye exams? Dr. Sabra Heck  If pt is not established with a provider, would they like to be referred to a provider to establish care? No .  Dental Screening: Recommended annual dental exams for proper oral hygiene  Community Resource Referral / Chronic Care Management: CRR required this visit?  No   CCM required this visit?  No      Plan:     I have personally reviewed and noted the following in the patient's chart:   . Medical and social history . Use of alcohol, tobacco or illicit drugs  . Current medications and supplements . Functional ability and status . Nutritional status . Physical activity . Advanced directives . List of other  physicians . Hospitalizations, surgeries, and ER visits in previous 12 months . Vitals . Screenings to include cognitive, depression, and falls . Referrals and appointments  In addition, I have reviewed and discussed with patient certain preventive protocols, quality metrics, and best practice recommendations. A written personalized care plan for preventive services as well as general preventive health recommendations were provided to patient.     Ofilia Neas, LPN   [id number]   Nurse Notes: None

## 2020-03-19 ENCOUNTER — Ambulatory Visit (INDEPENDENT_AMBULATORY_CARE_PROVIDER_SITE_OTHER): Payer: Medicare Other

## 2020-03-19 DIAGNOSIS — Z0000 Encounter for general adult medical examination without abnormal findings: Secondary | ICD-10-CM

## 2020-03-19 NOTE — Patient Instructions (Signed)
Jillian Escobar , Thank you for taking time to come for your Medicare Wellness Visit. I appreciate your ongoing commitment to your health goals. Please review the following plan we discussed and let me know if I can assist you in the future.   Screening recommendations/referrals: Colonoscopy: No longer required Mammogram: Up to date, next due 01/10/2020  Bone Density: Up to date, next due 02/19/2024 Recommended yearly ophthalmology/optometry visit for glaucoma screening and checkup Recommended yearly dental visit for hygiene and checkup  Vaccinations: Influenza vaccine: Up to date, next due fall 2022  Pneumococcal vaccine: Completed series  Tdap vaccine: Currently due, you may receive at your local pharmacy or await and injury. Shingles vaccine: Currently due for Shingrix, if you would like to receive we recommend that you do so at your local pharmacy as it is less expensive     Advanced directives: Please bring a copy of your advanced medical directives into our office so that we may scan them into your chart.   Conditions/risks identified: None   Next appointment: None    Preventive Care 65 Years and Older, Female Preventive care refers to lifestyle choices and visits with your health care provider that can promote health and wellness. What does preventive care include?  A yearly physical exam. This is also called an annual well check.  Dental exams once or twice a year.  Routine eye exams. Ask your health care provider how often you should have your eyes checked.  Personal lifestyle choices, including:  Daily care of your teeth and gums.  Regular physical activity.  Eating a healthy diet.  Avoiding tobacco and drug use.  Limiting alcohol use.  Practicing safe sex.  Taking low-dose aspirin every Escobar.  Taking vitamin and mineral supplements as recommended by your health care provider. What happens during an annual well check? The services and screenings done by your  health care provider during your annual well check will depend on your age, overall health, lifestyle risk factors, and family history of disease. Counseling  Your health care provider may ask you questions about your:  Alcohol use.  Tobacco use.  Drug use.  Emotional well-being.  Home and relationship well-being.  Sexual activity.  Eating habits.  History of falls.  Memory and ability to understand (cognition).  Work and work Statistician.  Reproductive health. Screening  You may have the following tests or measurements:  Height, weight, and BMI.  Blood pressure.  Lipid and cholesterol levels. These may be checked every 5 years, or more frequently if you are over 88 years old.  Skin check.  Lung cancer screening. You may have this screening every year starting at age 76 if you have a 30-pack-year history of smoking and currently smoke or have quit within the past 15 years.  Fecal occult blood test (FOBT) of the stool. You may have this test every year starting at age 15.  Flexible sigmoidoscopy or colonoscopy. You may have a sigmoidoscopy every 5 years or a colonoscopy every 10 years starting at age 58.  Hepatitis C blood test.  Hepatitis B blood test.  Sexually transmitted disease (STD) testing.  Diabetes screening. This is done by checking your blood sugar (glucose) after you have not eaten for a while (fasting). You may have this done every 1-3 years.  Bone density scan. This is done to screen for osteoporosis. You may have this done starting at age 50.  Mammogram. This may be done every 1-2 years. Talk to your health care provider about  how often you should have regular mammograms. Talk with your health care provider about your test results, treatment options, and if necessary, the need for more tests. Vaccines  Your health care provider may recommend certain vaccines, such as:  Influenza vaccine. This is recommended every year.  Tetanus, diphtheria, and  acellular pertussis (Tdap, Td) vaccine. You may need a Td booster every 10 years.  Zoster vaccine. You may need this after age 16.  Pneumococcal 13-valent conjugate (PCV13) vaccine. One dose is recommended after age 63.  Pneumococcal polysaccharide (PPSV23) vaccine. One dose is recommended after age 53. Talk to your health care provider about which screenings and vaccines you need and how often you need them. This information is not intended to replace advice given to you by your health care provider. Make sure you discuss any questions you have with your health care provider. Document Released: 01/17/2015 Document Revised: 09/10/2015 Document Reviewed: 10/22/2014 Elsevier Interactive Patient Education  2017 [place] Prevention in the Home Falls can cause injuries. They can happen to people of all ages. There are many things you can do to make your home safe and to help prevent falls. What can I do on the outside of my home?  Regularly fix the edges of walkways and driveways and fix any cracks.  Remove anything that might make you trip as you walk through a door, such as a raised step or threshold.  Trim any bushes or trees on the path to your home.  Use bright outdoor lighting.  Clear any walking paths of anything that might make someone trip, such as rocks or tools.  Regularly check to see if handrails are loose or broken. Make sure that both sides of any steps have handrails.  Any raised decks and porches should have guardrails on the edges.  Have any leaves, snow, or ice cleared regularly.  Use sand or salt on walking paths during winter.  Clean up any spills in your garage right away. This includes oil or grease spills. What can I do in the bathroom?  Use night lights.  Install grab bars by the toilet and in the tub and shower. Do not use towel bars as grab bars.  Use non-skid mats or decals in the tub or shower.  If you need to sit down in the shower, use  a plastic, non-slip stool.  Keep the floor dry. Clean up any water that spills on the floor as soon as it happens.  Remove soap buildup in the tub or shower regularly.  Attach bath mats securely with double-sided non-slip rug tape.  Do not have throw rugs and other things on the floor that can make you trip. What can I do in the bedroom?  Use night lights.  Make sure that you have a light by your bed that is easy to reach.  Do not use any sheets or blankets that are too big for your bed. They should not hang down onto the floor.  Have a firm chair that has side arms. You can use this for support while you get dressed.  Do not have throw rugs and other things on the floor that can make you trip. What can I do in the kitchen?  Clean up any spills right away.  Avoid walking on wet floors.  Keep items that you use a lot in easy-to-reach places.  If you need to reach something above you, use a strong step stool that has a grab bar.  Keep  electrical cords out of the way.  Do not use floor polish or wax that makes floors slippery. If you must use wax, use non-skid floor wax.  Do not have throw rugs and other things on the floor that can make you trip. What can I do with my stairs?  Do not leave any items on the stairs.  Make sure that there are handrails on both sides of the stairs and use them. Fix handrails that are broken or loose. Make sure that handrails are as long as the stairways.  Check any carpeting to make sure that it is firmly attached to the stairs. Fix any carpet that is loose or worn.  Avoid having throw rugs at the top or bottom of the stairs. If you do have throw rugs, attach them to the floor with carpet tape.  Make sure that you have a light switch at the top of the stairs and the bottom of the stairs. If you do not have them, ask someone to add them for you. What else can I do to help prevent falls?  Wear shoes that:  Do not have high heels.  Have  rubber bottoms.  Are comfortable and fit you well.  Are closed at the toe. Do not wear sandals.  If you use a stepladder:  Make sure that it is fully opened. Do not climb a closed stepladder.  Make sure that both sides of the stepladder are locked into place.  Ask someone to hold it for you, if possible.  Clearly mark and make sure that you can see:  Any grab bars or handrails.  First and last steps.  Where the edge of each step is.  Use tools that help you move around (mobility aids) if they are needed. These include:  Canes.  Walkers.  Scooters.  Crutches.  Turn on the lights when you go into a dark area. Replace any light bulbs as soon as they burn out.  Set up your furniture so you have a clear path. Avoid moving your furniture around.  If any of your floors are uneven, fix them.  If there are any pets around you, be aware of where they are.  Review your medicines with your doctor. Some medicines can make you feel dizzy. This can increase your chance of falling. Ask your doctor what other things that you can do to help prevent falls. This information is not intended to replace advice given to you by your health care provider. Make sure you discuss any questions you have with your health care provider. Document Released: 10/17/2008 Document Revised: 05/29/2015 Document Reviewed: 01/25/2014 Elsevier Interactive Patient Education  2017 [employer].

## 2020-05-01 ENCOUNTER — Other Ambulatory Visit: Payer: Self-pay

## 2020-05-01 ENCOUNTER — Ambulatory Visit (INDEPENDENT_AMBULATORY_CARE_PROVIDER_SITE_OTHER): Payer: Medicare Other | Admitting: Cardiology

## 2020-05-01 ENCOUNTER — Encounter: Payer: Self-pay | Admitting: Cardiology

## 2020-05-01 VITALS — BP 142/68 | HR 71 | Ht 63.0 in | Wt 176.0 lb

## 2020-05-01 DIAGNOSIS — E785 Hyperlipidemia, unspecified: Secondary | ICD-10-CM

## 2020-05-01 DIAGNOSIS — R0602 Shortness of breath: Secondary | ICD-10-CM | POA: Diagnosis not present

## 2020-05-01 DIAGNOSIS — Z8249 Family history of ischemic heart disease and other diseases of the circulatory system: Secondary | ICD-10-CM

## 2020-05-01 DIAGNOSIS — I493 Ventricular premature depolarization: Secondary | ICD-10-CM

## 2020-05-01 DIAGNOSIS — I351 Nonrheumatic aortic (valve) insufficiency: Secondary | ICD-10-CM | POA: Diagnosis not present

## 2020-05-01 DIAGNOSIS — I10 Essential (primary) hypertension: Secondary | ICD-10-CM | POA: Diagnosis not present

## 2020-05-01 NOTE — Patient Instructions (Addendum)
Medication Instructions:  No medication changes   *If you need a refill on your cardiac medications before your next appointment, please call your pharmacy*   Lab Work: Lab in July 2022 Fasting - lipid ,cmp  Labs in jan 2023 Fasting lipid, cmp  If you have labs (blood work) drawn today and your tests are completely normal, you will receive your results only by: [name] MyChart Message (if you have MyChart) OR . A paper copy in the mail If you have any lab test that is abnormal or we need to change your treatment, we will call you to review the results.   Testing/Procedures:  Will be schedule at [address]- Oct 2022 Your physician has requested that you have an echocardiogram. Echocardiography is a painless test that uses sound waves to create images of your heart. It provides your doctor with information about the size and shape of your heart and how well your heart's chambers and valves are working. This procedure takes approximately one hour. There are no restrictions for this procedure.    Follow-Up: At [facility], you and your health needs are our priority.  As part of our continuing mission to provide you with exceptional heart care, we have created designated Provider Care Teams.  These Care Teams include your primary Cardiologist (physician) and Advanced Practice Providers (APPs -  Physician Assistants and Nurse Practitioners) who all work together to provide you with the care you need, when you need it.     Your next appointment:   9 month(s)  The format for your next appointment:   In Person  Provider:   Glenetta Hew, MD  Other instructions   check your blood pressure  Periodically for the next 2 weeks or so  arrange would like it to be 135/80 or less

## 2020-05-01 NOTE — Progress Notes (Signed)
Primary Care Provider: Burnis Medin, MD Cardiologist: Glenetta Hew, MD Electrophysiologist: None  Clinic Note: Chief Complaint  Patient presents with  . Follow-up    6 months.  Doing well.  . Palpitations    Well-controlled  . Hypertension    Borderline levels.    ===================================  ASSESSMENT/PLAN   Problem List Items Addressed This Visit    Exertional shortness of breath (Chronic)    Doing fairly well.  Encouraged to increase exercise, dietary modification, and weight loss.      Relevant Orders   ECHOCARDIOGRAM COMPLETE   Family history of premature CAD (Chronic)    Follows up Vascepa great.  LDL down to 61.  Continue atorvastatin.  We will check coronary calcium score because previous CT scans have not shown coronary calcification.      Essential hypertension (Chronic)    Blood pressure little elevated today.  Seems like her pressures are increasing little bit.  I want her to keep a blood pressure log.  Her blood pressure should be less than 135/85.  Titrate up both losartan and HCTZ.  Currently taking 50 mg losartan, will increase to 100 and HCTZ to 25.      Symptomatic PVCs (Chronic)    Doing well with avoiding triggers.  Decrease caffeine intake and increase adequate hydration.  No significant ectopy or arrhythmias noted on monitor.  Will avoid beta-blocker for now.      Aortic valve insufficiency - Primary (Chronic)    Due for follow-up evaluation.  If stable, can hold off for couple years.      Relevant Orders   Lipid panel   Comprehensive metabolic panel   Lipid panel   Comprehensive metabolic panel   ECHOCARDIOGRAM COMPLETE    Other Visit Diagnoses    Hyperlipidemia LDL goal <100       Relevant Orders   Lipid panel   Comprehensive metabolic panel   Lipid panel   Comprehensive metabolic panel      ===================================  HPI:    Jillian Escobar is a 70 y.o. female with a PMH notable for HTN,  symptomatic PVCs, mild to moderate AI, hyperlipidemia, hypothyroidism and tobacco abuse with history of syncope who presents today for 20-month follow-up.   She has family history of premature CAD.   Myoview  01/14/2015:  9.5 METS. EF 71%, normal myocardial effusion,   Echocardiogram 11/07/2017: EF 60 to 65%, grade 2 DD, mild to moderate AI.   She has been evaluated on several occasions for chest pain and dyspnea on exertion. Her chest pain improved however she continued to have exertional dyspnea which was felt to be related to deconditioning. - CPX recommended - never done  CL[facility]A IMEL was last seen on October 08, 2019 by Almyra Deforest, PA-C for follow-up after March 2021 visit for symptomatic PACs and PVCs.  She has underlying RBBB and LAFB and therefore no AV NODAL AGENT USED.[name]  Recent Hospitalizations: none  Reviewed  CV studies:    The following studies were reviewed today: (if available, images/films reviewed: From Epic Chart or Care Everywhere) . none:  Interval History:   Jillian Escobar returns here today indicating that her palpitations are doing pretty well.  Her blood pressures tend to be in the 130s to 295 range systolic.  Still has some dyspnea but not all the time and not with activity.  No chest pain or pressure.  The only discomfort she has been noticing and some GI upset.  She has  been told to increase her GERD treatment.  She is taking Carafate and burning as well as omeprazole.  No real PND orthopnea or edema.  Palpitations are pretty much nonexistent at this point.  CV Review of Symptoms (Summary): positive for - dyspnea on exertion, palpitations and Both pretty well controlled. negative for - chest pain, edema, orthopnea, paroxysmal nocturnal dyspnea, rapid heart rate, shortness of breath or Lightheadedness or dizziness or syncope/near syncope, TIA/amaurosis fugax, claudication  The patient does not have symptoms concerning for COVID-19 infection (fever, chills,  cough, or new shortness of breath).   REVIEWED OF SYSTEMS   Review of Systems  Constitutional: Negative for malaise/fatigue and weight loss.  HENT: Negative for congestion.   Respiratory: Positive for shortness of breath (Intervention episodes.).   Gastrointestinal: Positive for abdominal pain and heartburn. Negative for blood in stool and melena.  Genitourinary: Negative for hematuria.  Musculoskeletal: Negative for joint pain.  Neurological: Negative for dizziness and focal weakness.  Psychiatric/Behavioral: Negative for memory loss. The patient is not nervous/anxious and does not have insomnia.    I have reviewed and (if needed) personally updated the patient's problem list, medications, allergies, past medical and surgical history, social and family history.   PAST MEDICAL HISTORY   Past Medical History:  Diagnosis Date  . Allergy   . Arthritis   . GERD (gastroesophageal reflux disease)   . Hx of varicella   . Hyperlipidemia    borderline  . Hypertension   . Hypothyroidism   . Pneumonia 11/2014   Only time took inhalers to treat  . RBBB 10/2019   recently seen at [facility] - f/u 6 mons, no treatment  . Shingles 10/26/2010  . Syncope and collapse 10/2019   Unclear etiology - just saw [facility] - f/u 6 months - no treatment  . Thyroid disease     PAST SURGICAL HISTORY   Past Surgical History:  Procedure Laterality Date  . CESAREAN SECTION     x1  . CHOLECYSTECTOMY    . COLONOSCOPY  2011   tics, no polyps - recall 5 yr  . ESOPHAGOGASTRODUODENOSCOPY (EGD) WITH PROPOFOL N/A 12/09/2015   Procedure: ESOPHAGOGASTRODUODENOSCOPY (EGD) WITH PROPOFOL;  Surgeon: Manus Gunning, MD;  Location: [facility];  Service: Gastroenterology;  Laterality: N/A;  . EXPLORATORY LAPAROTOMY     Gallbladder  . POLYPECTOMY N/A 12/09/2015   Procedure: POLYPECTOMY;  Surgeon: Manus Gunning, MD;  Location: [facility];  Service: Gastroenterology;  Laterality: N/A;  . TUBAL LIGATION    .  WISDOM TOOTH EXTRACTION      Immunization History  Administered Date(s) Administered  . Fluad Quad(high Dose 65+) 08/30/2018  . Hep A / Hep B 02/28/2009, 03/28/2009  . Hepatitis A 08/29/2009  . Hepatitis B 07/31/2009  . Influenza, High Dose Seasonal PF 10/31/2017  . Influenza-Unspecified 10/02/2019, 10/02/2019  . Meningococcal Polysaccharide 02/28/2009  . PFIZER(Purple Top)SARS-COV-2 Vaccination 01/26/2019, 02/16/2019, 10/02/2019  . Pneumococcal Conjugate-13 08/02/2018  . Pneumococcal Polysaccharide-23 08/21/2019  . Td 01/04/2002, 02/28/2009    MEDICATIONS/ALLERGIES   Current Meds  Medication Sig  . albuterol (VENTOLIN HFA) 108 (90 Base) MCG/ACT inhaler TAKE 2 PUFFS BY MOUTH EVERY 6 HOURS AS NEEDED FOR WHEEZE OR SHORTNESS OF BREATH  . atorvastatin (LIPITOR) 20 MG tablet Take 1 tablet (20 mg total) by mouth daily.  . calcium carbonate (OS-CAL) 600 MG TABS tablet   . Cholecalciferol (VITAMIN D PO) Take 2 tablets by mouth daily.  . DUEXIS 800-26.6 MG TABS Take 1 tablet by mouth daily  as needed.  [name] ELDERBERRY PO daily.   [name] esomeprazole (NEXIUM) 20 MG capsule Take 20 mg by mouth 2 (two) times daily.  . famotidine (PEPCID) 20 MG tablet Take 20 mg by mouth as needed for heartburn or indigestion.  . hydrochlorothiazide (MICROZIDE) 12.5 MG capsule TAKE 1 CAPSULE BY MOUTH EVERY DAY  . KRILL OIL PO Take by mouth in the morning and at bedtime.  [name] levothyroxine (SYNTHROID) 50 MCG tablet TAKE 1 TABLET BY MOUTH EVERY DAY  . losartan (COZAAR) 25 MG tablet TAKE 2 TABLETS (50 MG TOTAL) BY MOUTH DAILY. FOR HIGH BLOOD PRESSURE  . Misc Natural Products (OSTEO BI-FLEX/5-LOXIN ADVANCED PO) Take by mouth 2 (two) times daily at 8 am and 10 pm.  . Multiple Vitamin (MULTIVITAMIN WITH MINERALS) TABS tablet Take 1 tablet by mouth daily.   [name] OVER THE COUNTER MEDICATION Take 1 capsule by mouth daily.  . sucralfate (CARAFATE) 1 g tablet Take 1 tablet (1 g total) by mouth every 6 (six) hours as needed.  .  vitamin A 3 MG (10000 UNITS) capsule Take 10,000 Units by mouth daily.    Allergies  Allergen Reactions  . Levaquin [Levofloxacin]     See notes  September 2019  Palpitations irritability sleep issues      SOCIAL HISTORY/FAMILY HISTORY   Reviewed in Epic:  Pertinent findings:  Social History   Tobacco Use  . Smoking status: Former Smoker    Types: Cigarettes    Quit date: 01/04/1982    Years since quitting: 38.3  . Smokeless tobacco: Never Used  . Tobacco comment: Marijuana daily  Vaping Use  . Vaping Use: Former  Substance Use Topics  . Alcohol use: Yes    Alcohol/week: 14.0 standard drinks    Types: 14 Standard drinks or equivalent per week    Comment: daily- 2 a night   . Drug use: Yes    Types: Marijuana    Comment: Last use 10/15/19- Marijuana Daily   Social History   Social History Narrative   H H of 2    Married, G2P1   Former tobacco;drinks beer wine   Walks maybe 30 minutes or more days a week. Doesn't do more than that because she is "lazy"   Pet 2 dog and 2 cats .   [place] pre [employer]   Originally from [place], moved over 20 years ago    [employer] -PE, EKG, labs   Wt Readings from Last 3 Encounters:  05/01/20 176 lb (79.8 kg)  02/07/20 175 lb (79.4 kg)  02/01/20 175 lb (79.4 kg)    Physical Exam: BP (!) 142/68   Pulse 71   Ht 5\' 3"  (1.6 m)   Wt 176 lb (79.8 kg)   LMP  (LMP Unknown)   SpO2 98%   BMI 31.18 kg/m  Physical Exam Constitutional:      General: She is not in acute distress.    Appearance: Normal appearance. She is obese. She is not ill-appearing or toxic-appearing.  HENT:     Head: Normocephalic and atraumatic.  Neck:     Vascular: No carotid bruit.  Cardiovascular:     Rate and Rhythm: Normal rate and regular rhythm.     Pulses: Normal pulses.     Heart sounds: Murmur (Soft SEM at RUSB.) heard.  No friction rub. No gallop.   Pulmonary:     Effort: Pulmonary effort is normal. No respiratory  distress.     Breath sounds:  Normal breath sounds.  Chest:     Chest wall: No tenderness.  Musculoskeletal:        General: No swelling. Normal range of motion.     Cervical back: Normal range of motion and neck supple.  Skin:    General: Skin is warm and dry.  Neurological:     General: No focal deficit present.     Mental Status: She is alert and oriented to person, place, and time.  Psychiatric:        Mood and Affect: Mood normal.        Behavior: Behavior normal.        Thought Content: Thought content normal.        Judgment: Judgment normal.     Adult ECG Report Not checked  Recent Labs: Reviewed.  LDL notably improved.  Due for labs to be checked in July. Lab Results  Component Value Date   CHOL 113 01/08/2020   HDL 37 (L) 01/08/2020   LDLCALC 61 01/08/2020   LDLDIRECT 158.4 08/14/2012   TRIG 70 01/08/2020   CHOLHDL 3.1 01/08/2020   Lab Results  Component Value Date   CREATININE 0.94 04/04/2019   BUN 15 04/04/2019   NA 139 04/04/2019   K 4.1 04/04/2019   CL 101 04/04/2019   CO2 22 04/04/2019   CBC Latest Ref Rng & Units 02/01/2020 08/21/2019 02/07/2018  WBC 4.0 - 10.5 K/uL 5.4 4.7 4.9  Hemoglobin 12.0 - 15.0 g/dL 14.1 14.3 14.8  Hematocrit 36.0 - 46.0 % 41.3 42.4 42.8  Platelets 150.0 - 400.0 K/uL 346.0 322 372.0    Lab Results  Component Value Date   TSH 2.38 08/21/2019    ==================================================  COVID-19 Education: The signs and symptoms of COVID-19 were discussed with the patient and how to seek care for testing (follow up with PCP or arrange E-visit).   The importance of social distancing and COVID-19 vaccination was discussed today. The patient is practicing social distancing & Masking.   I spent a total of 99minutes with the patient spent in direct patient consultation.  Additional time spent with chart review  / charting (studies, outside notes, etc): 9 min Total Time: 31 min   Current medicines are reviewed at  length with the patient today.  (+/- concerns) n/a   This visit occurred during the SARS-CoV-2 public health emergency.  Safety protocols were in place, including screening questions prior to the visit, additional usage of staff PPE, and extensive cleaning of exam room while observing appropriate contact time as indicated for disinfecting solutions.  Notice: This dictation was prepared with Dragon dictation along with smaller phrase technology. Any transcriptional errors that result from this process are unintentional and may not be corrected upon review.  Patient Instructions / Medication Changes & Studies & Tests Ordered   Patient Instructions  Medication Instructions:  No medication changes   *If you need a refill on your cardiac medications before your next appointment, please call your pharmacy*   Lab Work: Lab in July 2022 Fasting - lipid ,cmp  Labs in jan 2023 Fasting lipid, cmp  If you have labs (blood work) drawn today and your tests are completely normal, you will receive your results only by: [name] MyChart Message (if you have MyChart) OR . A paper copy in the mail If you have any lab test that is abnormal or we need to change your treatment, we will call you to review the results.   Testing/Procedures:  Will be schedule at  [address]- Oct 2022 Your physician has requested that you have an echocardiogram. Echocardiography is a painless test that uses sound waves to create images of your heart. It provides your doctor with information about the size and shape of your heart and how well your heart's chambers and valves are working. This procedure takes approximately one hour. There are no restrictions for this procedure.    Follow-Up: At [facility], you and your health needs are our priority.  As part of our continuing mission to provide you with exceptional heart care, we have created designated Provider Care Teams.  These Care Teams include  your primary Cardiologist (physician) and Advanced Practice Providers (APPs -  Physician Assistants and Nurse Practitioners) who all work together to provide you with the care you need, when you need it.     Your next appointment:   9 month(s)  The format for your next appointment:   In Person  Provider:   Glenetta Hew, MD  Other instructions   check your blood pressure  Periodically for the next 2 weeks or so  arrange would like it to be 135/80 or less     Studies Ordered:   Orders Placed This Encounter  Procedures  . Lipid panel  . Comprehensive metabolic panel  . Lipid panel  . Comprehensive metabolic panel  . ECHOCARDIOGRAM COMPLETE     Glenetta Hew, M.D., M.S. Interventional Cardiologist   Pager # [phone] Phone # [phone] [address] [place], [place] [address]   Thank you for choosing Heartcare at [facility]!!

## 2020-05-02 ENCOUNTER — Telehealth: Payer: Self-pay | Admitting: Cardiology

## 2020-05-02 NOTE — Telephone Encounter (Signed)
Spoke with patient regarding the Echo ordered by Dr. Ellyn Hack for October 2022--Scheduled Tuesday 10/07/20 at 11:30 am a t [address], [address]--arrival time is 11:15 am for check in .  Will mail information to patient and she voiced her understanding.

## 2020-05-11 ENCOUNTER — Encounter: Payer: Self-pay | Admitting: Cardiology

## 2020-05-11 NOTE — Assessment & Plan Note (Addendum)
Doing well with avoiding triggers.  Decrease caffeine intake and increase adequate hydration.  No significant ectopy or arrhythmias noted on monitor.  Will avoid beta-blocker for now.

## 2020-05-11 NOTE — Assessment & Plan Note (Signed)
Doing fairly well.  Encouraged to increase exercise, dietary modification, and weight loss.

## 2020-05-11 NOTE — Assessment & Plan Note (Signed)
Blood pressure little elevated today.  Seems like her pressures are increasing little bit.  I want her to keep a blood pressure log.  Her blood pressure should be less than 135/85.  Titrate up both losartan and HCTZ.  Currently taking 50 mg losartan, will increase to 100 and HCTZ to 25.

## 2020-05-11 NOTE — Assessment & Plan Note (Signed)
Due for follow-up evaluation.  If stable, can hold off for couple years.

## 2020-05-11 NOTE — Assessment & Plan Note (Signed)
Follows up Vascepa great.  LDL down to 61.  Continue atorvastatin.  We will check coronary calcium score because previous CT scans have not shown coronary calcification.

## 2020-05-19 ENCOUNTER — Other Ambulatory Visit: Payer: Self-pay

## 2020-05-19 ENCOUNTER — Ambulatory Visit (INDEPENDENT_AMBULATORY_CARE_PROVIDER_SITE_OTHER): Payer: Medicare Other | Admitting: Family Medicine

## 2020-05-19 ENCOUNTER — Encounter: Payer: Self-pay | Admitting: Family Medicine

## 2020-05-19 VITALS — BP 136/80 | HR 93 | Temp 98.2°F | Ht 63.0 in | Wt 174.0 lb

## 2020-05-19 DIAGNOSIS — L509 Urticaria, unspecified: Secondary | ICD-10-CM

## 2020-05-19 DIAGNOSIS — J392 Other diseases of pharynx: Secondary | ICD-10-CM | POA: Diagnosis not present

## 2020-05-19 LAB — POCT RAPID STREP A (OFFICE): Rapid Strep A Screen: NEGATIVE

## 2020-05-19 NOTE — Patient Instructions (Addendum)
It is important to contact dermatology regarding your continued symptoms.  Consider switching allergy medicines.  Gargle with warm salt water or Chloraseptic spray for your throat.  You can also take Tylenol or ibuprofen for any pain or discomfort.  Sore Throat A sore throat is pain, burning, irritation, or scratchiness in the throat. When you have a sore throat, you may feel pain or tenderness in your throat when you swallow or talk. Many things can cause a sore throat, including:  An infection.  Seasonal allergies.  Dryness in the air.  Irritants, such as smoke or pollution.  Radiation treatment to the area.  Gastroesophageal reflux disease (GERD).  A tumor. A sore throat is often the first sign of another sickness. It may happen with other symptoms, such as coughing, sneezing, fever, and swollen neck glands. Most sore throats go away without medical treatment. Follow these instructions at home:  Take over-the-counter medicines only as told by your health care provider. ? If your child has a sore throat, do not give your child aspirin because of the association with Reye syndrome.  Drink enough fluids to keep your urine pale yellow.  Rest as needed.  To help with pain, try: ? Sipping warm liquids, such as broth, herbal tea, or warm water. ? Eating or drinking cold or frozen liquids, such as frozen ice pops. ? Gargling with a salt-water mixture 3-4 times a day or as needed. To make a salt-water mixture, completely dissolve -1 tsp (3-6 g) of salt in 1 cup (237 mL) of warm water. ? Sucking on hard candy or throat lozenges. ? Putting a cool-mist humidifier in your bedroom at night to moisten the air. ? Sitting in the bathroom with the door closed for 5-10 minutes while you run hot water in the shower.  Do not use any products that contain nicotine or tobacco, such as cigarettes, e-cigarettes, and chewing tobacco. If you need help quitting, ask your health care provider.  Wash  your hands well and often with soap and water. If soap and water are not available, use hand sanitizer.      Contact a health care provider if:  You have a fever for more than 2-3 days.  You have symptoms that last (are persistent) for more than 2-3 days.  Your throat does not get better within 7 days.  You have a fever and your symptoms suddenly get worse.  Your child who is 3 months to 64 years old has a temperature of 102.73F (39C) or higher. Get help right away if:  You have difficulty breathing.  You cannot swallow fluids, soft foods, or your saliva.  You have increased swelling in your throat or neck.  You have persistent nausea and vomiting. Summary  A sore throat is pain, burning, irritation, or scratchiness in the throat. Many things can cause a sore throat.  Take over-the-counter medicines only as told by your health care provider. Do not give your child aspirin.  Drink plenty of fluids, and rest as needed.  Contact a health care provider if your symptoms worsen or your sore throat does not get better within 7 days. This information is not intended to replace advice given to you by your health care provider. Make sure you discuss any questions you have with your health care provider. Document Revised: 05/23/2017 Document Reviewed: 05/23/2017 Elsevier Patient Education  2021 [place] (urticaria) are itchy, red, swollen areas on the skin. Hives can appear on any part of the  body. Hives often fade within 24 hours (acute hives). Sometimes, new hives appear after old ones fade and the cycle can continue for several days or weeks (chronic hives). Hives do not spread from person to person (are not contagious). Hives come from the body's reaction to something a person is allergic to (allergen), something that causes irritation, or various other triggers. When a person is exposed to a trigger, his or her body releases a chemical (histamine) that causes  redness, itching, and swelling. Hives can appear right after exposure to a trigger or hours later. What are the causes? This condition may be caused by:  Allergies to foods or ingredients.  Insect bites or stings.  Exposure to pollen or pets.  Contact with latex or chemicals.  Spending time in sunlight, heat, or cold (exposure).  Exercise.  Stress.  Certain medicines. You can also get hives from other medical conditions and treatments, such as:  Viruses, including the common cold.  Bacterial infections, such as urinary tract infections and strep throat.  Certain medicines.  Allergy shots.  Blood transfusions. Sometimes, the cause of this condition is not known (idiopathic hives). What increases the risk? You are more likely to develop this condition if you:  Are a woman.  Have food allergies, especially to citrus fruits, milk, eggs, peanuts, tree nuts, or shellfish.  Are allergic to: ? Medicines. ? Latex. ? Insects. ? Animals. ? Pollen. What are the signs or symptoms? Common symptoms of this condition include raised, itchy, red or white bumps or patches on your skin. These areas may:  Become large and swollen (welts).  Change in shape and location, quickly and repeatedly.  Be separate hives or connect over a large area of skin.  Sting or become painful.  Turn white when pressed in the center (blanch). In severe cases, yourhands, feet, and face may also become swollen. This may occur if hives develop deeper in your skin.   How is this diagnosed? This condition may be diagnosed by your symptoms, medical history, and physical exam.  Your skin, urine, or blood may be tested to find out what is causing your hives and to rule out other health issues.  Your health care provider may also remove a small sample of skin from the affected area and examine it under a microscope (biopsy). How is this treated? Treatment for this condition depends on the cause and  severity of your symptoms. Your health care provider may recommend using cool, wet cloths (cool compresses) or taking cool showers to relieve itching. Treatment may include:  Medicines that help: ? Relieve itching (antihistamines). ? Reduce swelling (corticosteroids). ? Treat infection (antibiotics).  An injectable medicine (omalizumab). Your health care provider may prescribe this if you have chronic idiopathic hives and you continue to have symptoms even after treatment with antihistamines. Severe cases may require an emergency injection of adrenaline (epinephrine) to prevent a life-threatening allergic reaction (anaphylaxis). Follow these instructions at home: Medicines  Take and apply over-the-counter and prescription medicines only as told by your health care provider.  If you were prescribed an antibiotic medicine, take it as told by your health care provider. Do not stop using the antibiotic even if you start to feel better. Skin care  Apply cool compresses to the affected areas.  Do not scratch or rub your skin. General instructions  Do not take hot showers or baths. This can make itching worse.  Do not wear tight-fitting clothing.  Use sunscreen and wear protective clothing when you  are outside.  Avoid any substances that cause your hives. Keep a journal to help track what causes your hives. Write down: ? What medicines you take. ? What you eat and drink. ? What products you use on your skin.  Keep all follow-up visits as told by your health care provider. This is important. Contact a health care provider if:  Your symptoms are not controlled with medicine.  Your joints are painful or swollen. Get help right away if:  You have a fever.  You have pain in your abdomen.  Your tongue or lips are swollen.  Your eyelids are swollen.  Your chest or throat feels tight.  You have trouble breathing or swallowing. These symptoms may represent a serious problem that  is an emergency. Do not wait to see if the symptoms will go away. Get medical help right away. Call your local emergency services (911 in the [place] Do not drive yourself to the hospital. Summary  Hives (urticaria) are itchy, red, swollen areas on your skin. Hives come from the body's reaction to something a person is allergic to (allergen), something that causes irritation, or various other triggers.  Treatment for this condition depends on the cause and severity of your symptoms.  Avoid any substances that cause your hives. Keep a journal to help track what causes your hives.  Take and apply over-the-counter and prescription medicines only as told by your health care provider.  Keep all follow-up visits as told by your health care provider. This is important. This information is not intended to replace advice given to you by your health care provider. Make sure you discuss any questions you have with your health care provider. Document Revised: 07/06/2017 Document Reviewed: 07/06/2017 Elsevier Patient Education  [place].

## 2020-05-19 NOTE — Progress Notes (Signed)
Subjective:    Patient ID: Jillian Escobar, female    DOB: 1950-05-06, 70 y.o.   MRN: [id number]  Chief Complaint  Patient presents with  . Rash    X 2 weeks, arms, chest, thighs, scratchy throat since this AM. Went to dermatology last week, given triamcinolone, has not helped. Has been on prednisone for this x 2 weeks ago.     HPI Patient was seen today for above concerns.  Patient with rash on body x 2 weeks. OTC prednisone did not help.  Pt went to Dermatology 5/11, dx'd with hives.  Given Triamcinolone cream and advised to take Allergra and benadryl.  Neither of which are helping.  Started on Prednisone 5/12 by hand dr.  Abbott Pao has not contacted Derm in regards to treatments not helping.  Pt endorses scratchy throat this am and fatigue.  States she has not been sleeping at her home which may be contributing to fatigue.  Denies fever, chills, nausea, vomiting, rhinorrhea, cough, changes in soaps, lotions, or detergents.  Pt has plans to drive to [place] tomorrow evening and will be gone for several days. Past Medical History:  Diagnosis Date  . Allergy   . Arthritis   . GERD (gastroesophageal reflux disease)   . Hx of varicella   . Hyperlipidemia    borderline  . Hypertension   . Hypothyroidism   . Pneumonia 11/2014   Only time took inhalers to treat  . RBBB 10/2019   recently seen at [facility] - f/u 6 mons, no treatment  . Shingles 10/26/2010  . Syncope and collapse 10/2019   Unclear etiology - just saw MD - f/u 6 months - no treatment  . Thyroid disease     Allergies  Allergen Reactions  . Levaquin [Levofloxacin]     See notes  September 2019  Palpitations irritability sleep issues      ROS General: Denies fever, chills, night sweats, changes in weight, changes in appetite  +fatigue HEENT: Denies headaches, ear pain, changes in vision, rhinorrhea, sore throat  +scratchy throat CV: Denies CP, palpitations, SOB, orthopnea Pulm: Denies SOB, cough, wheezing GI: Denies abdominal pain,  nausea, vomiting, diarrhea, constipation GU: Denies dysuria, hematuria, frequency, vaginal discharge Msk: Denies muscle cramps, joint pains Neuro: Denies weakness, numbness, tingling Skin: Denies bruising +pruritic rash on UEs, LEs, chest, back Psych: Denies depression, anxiety, hallucinations    Objective:    Blood pressure 136/80, pulse 93, temperature 98.2 F (36.8 C), temperature source Oral, height 5\' 3"  (1.6 m), weight 174 lb (78.9 kg), SpO2 98 %.  Gen. Pleasant, well-nourished, in no distress, normal affect  HEENT: [place]/AT, face symmetric, conjunctiva clear, no scleral icterus, PERRLA, EOMI, nares patent without drainage, pharynx with erythema no exudate. Lungs: no accessory muscle use Cardiovascular: RRR, no peripheral edema Musculoskeletal: No deformities, no cyanosis or clubbing, normal tone Neuro:  A&Ox3, CN II-XII intact, normal gait Skin:  Warm, dry, intact, erythematous circular plaques visible on UEs and chest.   Wt Readings from Last 3 Encounters:  05/19/20 174 lb (78.9 kg)  05/01/20 176 lb (79.8 kg)  02/07/20 175 lb (79.4 kg)    Lab Results  Component Value Date   WBC 5.4 02/01/2020   HGB 14.1 02/01/2020   HCT 41.3 02/01/2020   PLT 346.0 02/01/2020   GLUCOSE 87 04/04/2019   CHOL 113 01/08/2020   TRIG 70 01/08/2020   HDL 37 (L) 01/08/2020   LDLDIRECT 158.4 08/14/2012   LDLCALC 61 01/08/2020   ALT 27 03/10/2020  AST 32 03/10/2020   NA 139 04/04/2019   K 4.1 04/04/2019   CL 101 04/04/2019   CREATININE 0.94 04/04/2019   BUN 15 04/04/2019   CO2 22 04/04/2019   TSH 2.38 08/21/2019   HGBA1C 5.4 07/26/2018    Assessment/Plan:  Throat irritation -Discussed possible causes including strep, mono, acute viral illness, allergies, allergic reaction -rapid strep in clinic negative. -Swabs for mono unavailable. -Discussed supportive care including gargling with warm salt water or Chloraseptic, rest, hydration spray.  As needed for discomfort.  - Plan: POCT  rapid strep A  Hives -Patient encouraged to contact dermatology -Consider switching from Allegra to another OTC antihistamine such as Claritin, Zyrtec -Okay to continue Benadryl nightly and triamcinolone cream.  F/u as needed  Grier Mitts, MD

## 2020-06-13 ENCOUNTER — Other Ambulatory Visit: Payer: Self-pay | Admitting: Physician Assistant

## 2020-07-07 ENCOUNTER — Other Ambulatory Visit: Payer: Self-pay | Admitting: Cardiology

## 2020-07-25 LAB — COMPREHENSIVE METABOLIC PANEL
ALT: 13 IU/L (ref 0–32)
AST: 15 IU/L (ref 0–40)
Albumin/Globulin Ratio: 1.8 (ref 1.2–2.2)
Albumin: 4.2 g/dL (ref 3.8–4.8)
Alkaline Phosphatase: 75 IU/L (ref 44–121)
BUN/Creatinine Ratio: 23 (ref 12–28)
BUN: 17 mg/dL (ref 8–27)
Bilirubin Total: 0.8 mg/dL (ref 0.0–1.2)
CO2: 24 mmol/L (ref 20–29)
Calcium: 9.7 mg/dL (ref 8.7–10.3)
Chloride: 104 mmol/L (ref 96–106)
Creatinine, Ser: 0.74 mg/dL (ref 0.57–1.00)
Globulin, Total: 2.3 g/dL (ref 1.5–4.5)
Glucose: 103 mg/dL — ABNORMAL HIGH (ref 65–99)
Potassium: 4.4 mmol/L (ref 3.5–5.2)
Sodium: 142 mmol/L (ref 134–144)
Total Protein: 6.5 g/dL (ref 6.0–8.5)
eGFR: 89 mL/min/{1.73_m2} (ref >59)

## 2020-07-25 LAB — LIPID PANEL
Chol/HDL Ratio: 2.8 ratio (ref 0.0–4.4)
Cholesterol, Total: 133 mg/dL (ref 100–199)
HDL: 47 mg/dL (ref >39)
LDL Chol Calc (NIH): 67 mg/dL (ref 0–99)
Triglycerides: 105 mg/dL (ref 0–149)
VLDL Cholesterol Cal: 19 mg/dL (ref 5–40)

## 2020-08-11 ENCOUNTER — Other Ambulatory Visit: Payer: Self-pay | Admitting: Internal Medicine

## 2020-10-03 ENCOUNTER — Ambulatory Visit: Payer: Medicare Other | Admitting: Family Medicine

## 2020-10-07 ENCOUNTER — Other Ambulatory Visit: Payer: Self-pay | Admitting: Internal Medicine

## 2020-10-07 ENCOUNTER — Other Ambulatory Visit: Payer: Self-pay

## 2020-10-07 ENCOUNTER — Ambulatory Visit (HOSPITAL_COMMUNITY): Payer: Medicare Other | Attending: Cardiology

## 2020-10-07 DIAGNOSIS — I351 Nonrheumatic aortic (valve) insufficiency: Secondary | ICD-10-CM | POA: Diagnosis present

## 2020-10-07 DIAGNOSIS — R0602 Shortness of breath: Secondary | ICD-10-CM | POA: Diagnosis present

## 2020-10-07 HISTORY — PX: TRANSTHORACIC ECHOCARDIOGRAM: SHX275

## 2020-10-07 LAB — ECHOCARDIOGRAM COMPLETE
Area-P 1/2: 3.17 cm2
P 1/2 time: 531 msec
S' Lateral: 2.1 cm

## 2021-01-26 ENCOUNTER — Other Ambulatory Visit: Payer: Self-pay

## 2021-01-26 DIAGNOSIS — I351 Nonrheumatic aortic (valve) insufficiency: Secondary | ICD-10-CM

## 2021-01-26 DIAGNOSIS — E785 Hyperlipidemia, unspecified: Secondary | ICD-10-CM

## 2021-01-26 LAB — COMPREHENSIVE METABOLIC PANEL
ALT: 15 IU/L (ref 0–32)
AST: 18 IU/L (ref 0–40)
Albumin/Globulin Ratio: 2.2 (ref 1.2–2.2)
Albumin: 4.3 g/dL (ref 3.8–4.8)
Alkaline Phosphatase: 84 IU/L (ref 44–121)
BUN/Creatinine Ratio: 20 (ref 12–28)
BUN: 17 mg/dL (ref 8–27)
Bilirubin Total: 0.8 mg/dL (ref 0.0–1.2)
CO2: 26 mmol/L (ref 20–29)
Calcium: 9.7 mg/dL (ref 8.7–10.3)
Chloride: 105 mmol/L (ref 96–106)
Creatinine, Ser: 0.83 mg/dL (ref 0.57–1.00)
Globulin, Total: 2 g/dL (ref 1.5–4.5)
Glucose: 97 mg/dL (ref 70–99)
Potassium: 4 mmol/L (ref 3.5–5.2)
Sodium: 142 mmol/L (ref 134–144)
Total Protein: 6.3 g/dL (ref 6.0–8.5)
eGFR: 77 mL/min/{1.73_m2} (ref >59)

## 2021-01-26 LAB — LIPID PANEL
Chol/HDL Ratio: 3 ratio (ref 0.0–4.4)
Cholesterol, Total: 151 mg/dL (ref 100–199)
HDL: 51 mg/dL (ref >39)
LDL Chol Calc (NIH): 87 mg/dL (ref 0–99)
Triglycerides: 66 mg/dL (ref 0–149)
VLDL Cholesterol Cal: 13 mg/dL (ref 5–40)

## 2021-01-28 ENCOUNTER — Encounter: Payer: Self-pay | Admitting: Cardiology

## 2021-01-28 ENCOUNTER — Other Ambulatory Visit: Payer: Self-pay

## 2021-01-28 ENCOUNTER — Ambulatory Visit (INDEPENDENT_AMBULATORY_CARE_PROVIDER_SITE_OTHER): Payer: Medicare Other | Admitting: Cardiology

## 2021-01-28 VITALS — BP 116/62 | HR 64 | Ht 63.0 in | Wt 181.6 lb

## 2021-01-28 DIAGNOSIS — E785 Hyperlipidemia, unspecified: Secondary | ICD-10-CM | POA: Diagnosis not present

## 2021-01-28 DIAGNOSIS — R002 Palpitations: Secondary | ICD-10-CM | POA: Diagnosis not present

## 2021-01-28 DIAGNOSIS — I10 Essential (primary) hypertension: Secondary | ICD-10-CM

## 2021-01-28 DIAGNOSIS — I493 Ventricular premature depolarization: Secondary | ICD-10-CM

## 2021-01-28 DIAGNOSIS — I351 Nonrheumatic aortic (valve) insufficiency: Secondary | ICD-10-CM

## 2021-01-28 DIAGNOSIS — Z8249 Family history of ischemic heart disease and other diseases of the circulatory system: Secondary | ICD-10-CM

## 2021-01-28 NOTE — Progress Notes (Signed)
Primary Care Provider: Burnis Medin, MD Cardiologist: Glenetta Hew, MD Electrophysiologist: None  Clinic Note: Chief Complaint  Patient presents with   Follow-up    54-month   Cardiac Valve Problem    Aortic insufficiency on echo-echo results    ===================================  ASSESSMENT/PLAN   Problem List Items Addressed This Visit       Cardiology Problems   Essential hypertension (Chronic)    Blood pressure well controlled on low-dose losartan 12.5 mg and-HCTZ 12.5 mg        Relevant Medications   losartan (COZAAR) 25 MG tablet   Other Relevant Orders   EKG 12-Lead (Completed)   Symptomatic PVCs (Chronic)    Doing well.  Well-controlled without beta-blockers.  She simply has reduced her triggers such as caffeine and sweets.  Also maintain adequate hydration.      Relevant Medications   losartan (COZAAR) 25 MG tablet   Aortic valve insufficiency (Chronic)    Stable mild to moderate AI on echo.  Okay to follow-up again in 2025.      Relevant Medications   losartan (COZAAR) 25 MG tablet     Other   Family history of premature CAD (Chronic)    Continue blood pressure and lipid control.  We talked about potentially ordering coronary calcium score however her lipids are pretty well controlled with an last LDL documented at 67 on current dose of atorvastatin.  Her blood pressure is well controlled.  Perhaps only additional therapy would be suggesting aspirin if there is elevated coronary calcium score.  As such, we agreed that we would hold off on testing at this point.      Other Visit Diagnoses     Hyperlipidemia LDL goal <100    -  Primary   Relevant Medications   losartan (COZAAR) 25 MG tablet   Other Relevant Orders   EKG 12-Lead (Completed)   Palpitations       Relevant Orders   EKG 12-Lead (Completed)       ===================================  HPI:    Jillian Escobar is a 71 y.o. female with a PMH notable for HTN, symptomatic  PVCs and mild to moderate AI along with HLD and hypothyroidism, tobacco abuse along with history of syncope who presents today for 58-month follow-up.  Family history of premature CAD Myoview January 2017: Normal Myocardial Perfusion-No Ischemia or Infarction.  EF 71%.  Achieved 9.5 METS Mild to Moderate Aortic Insufficiency => noted on echo in November 2019 Baseline EKG shows RBBB and LAFB.  Also has history of symptomatic PACs and PVCs.  [name] was last seen in April 2022-palpitations doing well.  Blood pressures relatively stable in the 130 to 140 mmHg range.  Intermittent episodes of dyspnea.  No chest pain or pressure.  Only GI upset-relieved with Carafate and omeprazole.[name]  Recent Hospitalizations: None  Reviewed  CV studies:    The following studies were reviewed today: (if available, images/films reviewed: From Epic Chart or Care Everywhere) TTE 11/07/2017: EF 60 to 65%.  No RWMA.  GR 2 DD.  Elevated LAP.  Mild to moderate AI.  Normal PAP, normal RAP TTE 10/07/2020: EF 60 to 65%.  Mild concentric LVH.  GRII DD.  Mild to moderate AI.  Normal PAP.  Normal RAP.  Normal RAP.  Stable AI   Interval History   HAELEIGH STREIFF is being seen essentially for annual follow-up.  She says that she is trying to eat right and walking daily.  Despite all  this, she is actually gained weight.  She is little frustrated by that.  Unfortunately, despite her trying to do her exercise, she is bothered by knee pain and back pain.  She tries to take Celebrex off-and-on.  She started using hemp and CBD oil as well as occasional marijuana and that seems to help her arthritis pains.  She also recently joined an exercise gym near where she lives.  She is hoping that we will give her more opportunities to attempt different apps of exercise.  She has not had any chest pain or pressure with rest or exertion.  Palpitations are pretty well controlled.  Her blood pressures have been pretty well controlled and she  is only taking 1/2 tablet of her losartan dose.    CV Review of Symptoms (Summary) Cardiovascular ROS: no chest pain or dyspnea on exertion positive for - Mild exercise intolerance, but mostly limited by MSK pain. negative for - edema, irregular heartbeat, orthopnea, palpitations, paroxysmal nocturnal dyspnea, rapid heart rate, shortness of breath, or lightheadedness, dizziness or wooziness, syncope/near syncope or TIA/amaurosis fugax, claudication  REVIEWED OF SYSTEMS   Review of Systems  Constitutional:  Negative for malaise/fatigue and weight loss (Actually gained weight).  HENT:  Negative for congestion and nosebleeds.   Respiratory:  Negative for cough and shortness of breath.   Gastrointestinal:  Positive for abdominal pain (Occasional abdominal pain) and heartburn. Negative for blood in stool, constipation and melena.  Musculoskeletal:  Positive for joint pain. Negative for falls and myalgias.  Neurological:  Negative for dizziness, focal weakness and weakness.  Psychiatric/Behavioral:  Negative for memory loss. The patient is not nervous/anxious and does not have insomnia.    I have reviewed and (if needed) personally updated the patient's problem list, medications, allergies, past medical and surgical history, social and family history.   PAST MEDICAL HISTORY   Past Medical History:  Diagnosis Date   Allergy    Arthritis    GERD (gastroesophageal reflux disease)    Hx of varicella    Hyperlipidemia    borderline   Hypertension    Hypothyroidism    Pneumonia 11/2014   Only time took inhalers to treat   RBBB 10/2019   recently seen at [facility] - f/u 6 mons, no treatment   Shingles 10/26/2010   Syncope and collapse 10/2019   Unclear etiology - just saw MD - f/u 6 months - no treatment   Thyroid disease     PAST SURGICAL HISTORY   Past Surgical History:  Procedure Laterality Date   CESAREAN SECTION     x1   CHOLECYSTECTOMY     COLONOSCOPY  2011   tics, no polyps -  recall 5 yr   ESOPHAGOGASTRODUODENOSCOPY (EGD) WITH PROPOFOL N/A 12/09/2015   Procedure: ESOPHAGOGASTRODUODENOSCOPY (EGD) WITH PROPOFOL;  Surgeon: Manus Gunning, MD;  Location: [facility];  Service: Gastroenterology;  Laterality: N/A;   EXPLORATORY LAPAROTOMY     Gallbladder   POLYPECTOMY N/A 12/09/2015   Procedure: POLYPECTOMY;  Surgeon: Manus Gunning, MD;  Location: [facility];  Service: Gastroenterology;  Laterality: N/A;   TRANSTHORACIC ECHOCARDIOGRAM  10/07/2020   EF 60 to 65%.  Mild concentric LVH.  GRII DD.  Mild to moderate AI.  Normal PAP.  Normal RAP.  Normal RAP.  Stable AI   TUBAL LIGATION     WISDOM TOOTH EXTRACTION      Immunization History  Administered Date(s) Administered   Fluad Quad(high Dose 65+) 08/30/2018, 10/15/2020   Hep A / Hep  B 02/28/2009, 03/28/2009   Hepatitis A 08/29/2009   Hepatitis B 07/31/2009   Influenza, High Dose Seasonal PF 10/31/2017   Influenza-Unspecified 10/02/2019, 10/02/2019, 10/15/2020   Meningococcal Polysaccharide 02/28/2009   PFIZER(Purple Top)SARS-COV-2 Vaccination 01/26/2019, 02/16/2019, 10/02/2019   Pfizer Covid-19 Vaccine Bivalent Booster 59yrs & up 10/15/2020   Pneumococcal Conjugate-13 08/02/2018   Pneumococcal Polysaccharide-23 08/21/2019   Td 01/04/2002, 02/28/2009    MEDICATIONS/ALLERGIES   Current Meds  Medication Sig   albuterol (VENTOLIN HFA) 108 (90 Base) MCG/ACT inhaler TAKE 2 PUFFS BY MOUTH EVERY 6 HOURS AS NEEDED FOR WHEEZE OR SHORTNESS OF BREATH   atorvastatin (LIPITOR) 20 MG tablet TAKE 1 TABLET BY MOUTH EVERY DAY   calcium carbonate (OS-CAL) 600 MG TABS tablet    Cholecalciferol (VITAMIN D PO) Take 2 tablets by mouth daily.   DUEXIS 800-26.6 MG TABS Take 1 tablet by mouth daily as needed.   ELDERBERRY PO daily.    esomeprazole (NEXIUM) 20 MG capsule Take 20 mg by mouth 2 (two) times daily.   famotidine (PEPCID) 20 MG tablet Take 20 mg by mouth as needed for heartburn or indigestion.    hydrochlorothiazide (MICROZIDE) 12.5 MG capsule TAKE 1 CAPSULE BY MOUTH EVERY DAY   KRILL OIL PO Take by mouth in the morning and at bedtime.   losartan (COZAAR) 25 MG tablet Take 25 mg by mouth daily.   Misc Natural Products (OSTEO BI-FLEX/5-LOXIN ADVANCED PO) Take by mouth 2 (two) times daily at 8 am and 10 pm.   Multiple Vitamin (MULTIVITAMIN WITH MINERALS) TABS tablet Take 1 tablet by mouth daily.    OVER THE COUNTER MEDICATION Take 1 capsule by mouth daily.   sucralfate (CARAFATE) 1 g tablet Take 1 tablet (1 g total) by mouth every 6 (six) hours as needed.   vitamin A 3 MG (10000 UNITS) capsule Take 10,000 Units by mouth daily.   [DISCONTINUED] levothyroxine (SYNTHROID) 50 MCG tablet TAKE 1 TABLET BY MOUTH EVERY DAY    Allergies  Allergen Reactions   Levaquin [Levofloxacin]     See notes  September 2019  Palpitations irritability sleep issues      SOCIAL HISTORY/FAMILY HISTORY   Reviewed in Epic:  Pertinent findings:  Social History   Tobacco Use   Smoking status: Former    Types: Cigarettes    Quit date: 01/04/1982    Years since quitting: 39.2   Smokeless tobacco: Never   Tobacco comments:    Marijuana daily  Vaping Use   Vaping Use: Former  Substance Use Topics   Alcohol use: Yes    Alcohol/week: 14.0 standard drinks    Types: 14 Standard drinks or equivalent per week    Comment: daily- 2 a night    Drug use: Yes    Types: Marijuana    Comment: Last use 10/15/19- Marijuana Daily   Social History   Social History Narrative   H H of 2    Married, G2P1   Former tobacco;drinks beer wine   Walks maybe 30 minutes or more days a week. Doesn't do more than that because she is "lazy"   Pet 2 dog and 2 cats .   [employer] pre K and K   Originally from [place], moved over 20 years ago    OBJCTIVE -PE, EKG, labs   Wt Readings from Last 3 Encounters:  03/11/21 178 lb (80.7 kg)  02/25/21 179 lb 6.4 oz (81.4 kg)  02/11/21 179 lb (81.2 kg)     Physical  Exam: BP 116/62    Pulse 64    Ht 5\' 3"  (1.6 m)    Wt 181 lb 9.6 oz (82.4 kg)    LMP  (LMP Unknown)    SpO2 99%    BMI 32.17 kg/m  Physical Exam Vitals reviewed.  Constitutional:      General: She is not in acute distress.    Appearance: Normal appearance. She is normal weight. She is not ill-appearing or toxic-appearing.  HENT:     Head: Normocephalic and atraumatic.  Neck:     Vascular: No carotid bruit or JVD.  Cardiovascular:     Rate and Rhythm: Normal rate and regular rhythm. No extrasystoles are present.    Chest Wall: PMI is not displaced.     Pulses: Normal pulses. No decreased pulses.     Heart sounds: S1 normal and S2 normal. Murmur (Soft 1/6 SEM at RUSB with 1/4 DM) heard.    No friction rub. No gallop.  Pulmonary:     Effort: Pulmonary effort is normal. No respiratory distress.     Breath sounds: Normal breath sounds. No wheezing, rhonchi or rales.  Chest:     Chest wall: No tenderness.  Musculoskeletal:        General: No swelling. Normal range of motion.     Cervical back: Normal range of motion and neck supple.  Skin:    General: Skin is warm and dry.  Neurological:     General: No focal deficit present.     Mental Status: She is alert and oriented to person, place, and time.     Gait: Gait normal.  Psychiatric:        Mood and Affect: Mood normal.        Behavior: Behavior normal.        Thought Content: Thought content normal.        Judgment: Judgment normal.     Adult ECG Report  Rate: 64 ;  Rhythm: normal sinus rhythm and RBBB, LAFB with LVH. ; Otherwise normal intervals and durations.  Narrative Interpretation: Stable  Recent Labs: Reviewed Lab Results  Component Value Date   CHOL 151 01/26/2021   HDL 51 01/26/2021   LDLCALC 87 01/26/2021   LDLDIRECT 158.4 08/14/2012   TRIG 66 01/26/2021   CHOLHDL 3.0 01/26/2021   Lab Results  Component Value Date   CREATININE 0.83 01/26/2021   BUN 17 01/26/2021   NA 142 01/26/2021   K 4.0  01/26/2021   CL 105 01/26/2021   CO2 26 01/26/2021   CBC Latest Ref Rng & Units 02/01/2020 08/21/2019 02/07/2018  WBC 4.0 - 10.5 K/uL 5.4 4.7 4.9  Hemoglobin 12.0 - 15.0 g/dL 14.1 14.3 14.8  Hematocrit 36.0 - 46.0 % 41.3 42.4 42.8  Platelets 150.0 - 400.0 K/uL 346.0 322 372.0    Lab Results  Component Value Date   HGBA1C 5.4 07/26/2018   Lab Results  Component Value Date   TSH 2.38 08/21/2019    ==================================================  COVID-19 Education: The signs and symptoms of COVID-19 were discussed with the patient and how to seek care for testing (follow up with PCP or arrange E-visit).    I spent a total of 24 minutes with the patient spent in direct patient consultation.  Additional time spent with chart review  / charting (studies, outside notes, etc): 10 min Total Time: 34 min  Current medicines are reviewed at length with the patient today.  (+/- concerns) none  This visit occurred during the  SARS-CoV-2 public health emergency.  Safety protocols were in place, including screening questions prior to the visit, additional usage of staff PPE, and extensive cleaning of exam room while observing appropriate contact time as indicated for disinfecting solutions.  Notice: This dictation was prepared with Dragon dictation along with smart phrase technology. Any transcriptional errors that result from this process are unintentional and may not be corrected upon review.  Studies Ordered:   Orders Placed This Encounter  Procedures   EKG 12-Lead    Patient Instructions / Medication Changes & Studies & Tests Ordered   Patient Instructions  .Medication Instructions:   No changes *If you need a refill on your cardiac medications before your next appointment, please call your pharmacy*   Lab Work: Not needed     Testing/Procedures:  Not needed  Follow-Up: At [facility], you and your health needs are our priority.  As part of our continuing mission  to provide you with exceptional heart care, we have created designated Provider Care Teams.  These Care Teams include your primary Cardiologist (physician) and Advanced Practice Providers (APPs -  Physician Assistants and Nurse Practitioners) who all work together to provide you with the care you need, when you need it.     Your next appointment:   12 month(s)  The format for your next appointment:   In Person  Provider:   Glenetta Hew, MD      Glenetta Hew, M.D., M.S. Interventional Cardiologist   Pager # [phone] Phone # [phone] [address] [place], [place] [address]   Thank you for choosing Heartcare at [facility]!!

## 2021-01-28 NOTE — Patient Instructions (Addendum)

## 2021-01-29 ENCOUNTER — Other Ambulatory Visit: Payer: Self-pay | Admitting: Internal Medicine

## 2021-02-03 ENCOUNTER — Other Ambulatory Visit: Payer: Self-pay

## 2021-02-03 DIAGNOSIS — Z79899 Other long term (current) drug therapy: Secondary | ICD-10-CM

## 2021-02-03 DIAGNOSIS — E785 Hyperlipidemia, unspecified: Secondary | ICD-10-CM

## 2021-02-09 ENCOUNTER — Other Ambulatory Visit: Payer: Self-pay | Admitting: Internal Medicine

## 2021-02-09 DIAGNOSIS — Z1231 Encounter for screening mammogram for malignant neoplasm of breast: Secondary | ICD-10-CM

## 2021-02-10 NOTE — Progress Notes (Signed)
Jillian Escobar Phone: [phone] Subjective:   Jillian Escobar, am serving as a scribe for Dr. Hulan Saas. This visit occurred during the SARS-CoV-2 public health emergency.  Safety protocols were in place, including screening questions prior to the visit, additional usage of staff PPE, and extensive cleaning of exam room while observing appropriate contact time as indicated for disinfecting solutions.    I'm seeing this patient by the request  of:  Panosh, Standley Brooking, MD  CC: back and neck pain   [id number]  Jillian Escobar is a 71 y.o. female coming in with complaint of back and hip pain. Patient states that she has had pain for years. Pain in lumbar spine and B hips. Notes stiffness in the mornings. History of spinal stenosis. Pain with standing, walking and lying down. Has leg numbness with walking in L leg. Working out at [employer] which increases her pain but she still goes. Patient was using Duexis but is using prn per cardiology. Has also tried Celebrex but cardiology would like her to [place] prn. Has had epidural injections but they have not helped.       Past Medical History:  Diagnosis Date   Allergy    Arthritis    GERD (gastroesophageal reflux disease)    Hx of varicella    Hyperlipidemia    borderline   Hypertension    Hypothyroidism    Pneumonia 11/2014   Only time took inhalers to treat   RBBB 10/2019   [facility] seen at [facility] - f/u 6 mons, Escobar treatment   Shingles 10/26/2010   Syncope and collapse 10/2019   Unclear etiology - just saw [facility] - f/u 6 months - Escobar treatment   Thyroid disease    Past Surgical History:  Procedure Laterality Date   CESAREAN SECTION     x1   CHOLECYSTECTOMY     COLONOSCOPY  2011   tics, Escobar polyps - recall 5 yr   ESOPHAGOGASTRODUODENOSCOPY (EGD) WITH PROPOFOL N/A 12/09/2015   Procedure: ESOPHAGOGASTRODUODENOSCOPY (EGD) WITH PROPOFOL;  Surgeon: Manus Gunning, MD;   Location: [facility];  Service: Gastroenterology;  Laterality: N/A;   EXPLORATORY LAPAROTOMY     Gallbladder   POLYPECTOMY N/A 12/09/2015   Procedure: POLYPECTOMY;  Surgeon: Manus Gunning, MD;  Location: [facility];  Service: Gastroenterology;  Laterality: N/A;   TUBAL LIGATION     WISDOM TOOTH EXTRACTION     Social History   Socioeconomic History   Marital status: Married    Spouse name: Not on file   Number of children: 1   Years of education: Not on file   Highest education level: Not on file  Occupational History   Not on file  Tobacco Use   Smoking status: Former    Types: Cigarettes    Quit date: 01/04/1982    Years since quitting: 39.1   Smokeless tobacco: Never   Tobacco comments:    Marijuana daily  Vaping Use   Vaping Use: Former  Substance and Sexual Activity   Alcohol use: Yes    Alcohol/week: 14.0 standard drinks    Types: 14 Standard drinks or equivalent per week    Comment: daily- 2 a night    Drug use: Yes    Types: Marijuana    Comment: Last use 10/15/19- Marijuana Daily   Sexual activity: Yes    Birth control/protection: Post-menopausal  Other Topics Concern   Not on file  Social History  Narrative   H H of 2    Married, G2P1   Former tobacco;drinks beer wine   Walks maybe 30 minutes or more days a week. Doesn't do more than that because she is "lazy"   Pet 2 dog and 2 cats .   [employer] [place] pre K and K   Originally from [place], moved over 20 years ago   Social Determinants of Radio broadcast assistant Strain: Low Risk    Difficulty of Paying Living Expenses: Not hard at [employer] Insecurity: Escobar Food Insecurity   Worried About Charity fundraiser in the Last Year: Never true   Arboriculturist in the Last Year: Never true  Transportation Needs: Escobar Transportation Needs   Lack of Transportation (Medical): Escobar   Lack of Transportation (Non-Medical): Escobar  Physical Activity: Sufficiently Active   Days of Exercise per  Week: 7 days   Minutes of Exercise per Session: 30 min  Stress: Escobar Stress Concern Present   Feeling of Stress : Not at all  Social Connections: Moderately Isolated   Frequency of Communication with Friends and Family: More than three times a week   Frequency of Social Gatherings with Friends and Family: More than three times a week   Attends Religious Services: Never   Marine scientist or Organizations: Escobar   Attends Archivist Meetings: Never   Marital Status: Married   Allergies  Allergen Reactions   Levaquin [Levofloxacin]     See notes  September 2019  Palpitations irritability sleep issues     Family History  Problem Relation Age of Onset   Heart disease Mother        stent in 61 s   COPD Father    Heart disease Father        cabg in 66s    Glaucoma Father    Dementia Father    Ulcerative colitis Sister    Ulcerative colitis Brother    Arrhythmia Daughter    Colon cancer Cousin    Colon polyps Neg Hx    Esophageal cancer Neg Hx    Rectal cancer Neg Hx    Stomach cancer Neg Hx     Current Outpatient Medications (Endocrine & Metabolic):    levothyroxine (SYNTHROID) 50 MCG tablet, TAKE 1 TABLET BY MOUTH EVERY DAY  Current Outpatient Medications (Cardiovascular):    atorvastatin (LIPITOR) 20 MG tablet, TAKE 1 TABLET BY MOUTH EVERY DAY   hydrochlorothiazide (MICROZIDE) 12.5 MG capsule, TAKE 1 CAPSULE BY MOUTH EVERY DAY   losartan (COZAAR) 25 MG tablet, Take 25 mg by mouth daily.  Current Outpatient Medications (Respiratory):    albuterol (VENTOLIN HFA) 108 (90 Base) MCG/ACT inhaler, TAKE 2 PUFFS BY MOUTH EVERY 6 HOURS AS NEEDED FOR WHEEZE OR SHORTNESS OF BREATH  Current Outpatient Medications (Analgesics):    celecoxib (CELEBREX) 200 MG capsule, Take 1 capsule (200 mg total) by mouth daily.   DUEXIS 800-26.6 MG TABS, Take 1 tablet by mouth daily as needed.   Current Outpatient Medications (Other):    calcium carbonate (OS-CAL) 600 MG TABS tablet,     Cholecalciferol (VITAMIN D PO), Take 2 tablets by mouth daily.   ELDERBERRY PO, daily.    esomeprazole (NEXIUM) 20 MG capsule, Take 20 mg by mouth 2 (two) times daily.   famotidine (PEPCID) 20 MG tablet, Take 20 mg by mouth as needed for heartburn or indigestion.   KRILL OIL PO, Take by mouth in  the morning and at bedtime.   Misc Natural Products (OSTEO BI-FLEX/5-LOXIN ADVANCED PO), Take by mouth 2 (two) times daily at 8 am and 10 pm.   Multiple Vitamin (MULTIVITAMIN WITH MINERALS) TABS tablet, Take 1 tablet by mouth daily.    OVER THE COUNTER MEDICATION, Take 1 capsule by mouth daily.   sucralfate (CARAFATE) 1 g tablet, Take 1 tablet (1 g total) by mouth every 6 (six) hours as needed.   vitamin A 3 MG (10000 UNITS) capsule, Take 10,000 Units by mouth daily.   Reviewed prior external information including notes and imaging from  primary care provider As well as notes that were available from care everywhere and other healthcare systems.  Past medical history, social, surgical and family history all reviewed in electronic medical record.  Escobar pertanent information unless stated regarding to the chief complaint.   Review of Systems:  Escobar headache, visual changes, nausea, vomiting, diarrhea, constipation, dizziness, abdominal pain, skin rash, fevers, chills, night sweats, weight loss, swollen lymph nodes, body aches, joint swelling, chest pain, shortness of breath, mood changes. POSITIVE muscle aches  Objective  Blood pressure 124/82, pulse 76, height 5\' 3"  (1.6 m), weight 179 lb (81.2 kg), SpO2 98 %.   General: Escobar apparent distress alert and oriented x3 mood and affect normal, dressed appropriately.  HEENT: Pupils equal, extraocular movements intact  Respiratory: Patient's speak in full sentences and does not appear short of breath  Cardiovascular: Escobar lower extremity edema, non tender, Escobar erythema  Gait normal with good balance and coordination.  MSK:  back exam shows loss of lordosis  tightness of FABER test as well 5/5 strength of the hip abductors as well.  Tightness in the thoracolumbar junction.  Negative straight leg test.  Patient does have lateralized with extension.  Patient does have very mild increase in kyphosis of the upper back.  Osteopathic findings  C4 flexed rotated and side bent left C7 flexed rotated and side bent left T3 extended rotated and side bent right inhaled third rib T8 extended rotated and side bent left L2 flexed rotated and side bent right Sacrum right on right     Impression and Recommendations:     The above documentation has been reviewed and is accurate and complete [name], DO

## 2021-02-11 ENCOUNTER — Ambulatory Visit (INDEPENDENT_AMBULATORY_CARE_PROVIDER_SITE_OTHER): Payer: Medicare Other

## 2021-02-11 ENCOUNTER — Encounter: Payer: Self-pay | Admitting: Family Medicine

## 2021-02-11 ENCOUNTER — Ambulatory Visit (INDEPENDENT_AMBULATORY_CARE_PROVIDER_SITE_OTHER): Payer: Medicare Other | Admitting: Family Medicine

## 2021-02-11 ENCOUNTER — Other Ambulatory Visit: Payer: Self-pay

## 2021-02-11 VITALS — BP 124/82 | HR 76 | Ht 63.0 in | Wt 179.0 lb

## 2021-02-11 DIAGNOSIS — M25551 Pain in right hip: Secondary | ICD-10-CM

## 2021-02-11 DIAGNOSIS — M5136 Other intervertebral disc degeneration, lumbar region: Secondary | ICD-10-CM

## 2021-02-11 DIAGNOSIS — M9903 Segmental and somatic dysfunction of lumbar region: Secondary | ICD-10-CM | POA: Diagnosis not present

## 2021-02-11 DIAGNOSIS — M9902 Segmental and somatic dysfunction of thoracic region: Secondary | ICD-10-CM

## 2021-02-11 DIAGNOSIS — M545 Low back pain, unspecified: Secondary | ICD-10-CM

## 2021-02-11 DIAGNOSIS — M25552 Pain in left hip: Secondary | ICD-10-CM

## 2021-02-11 DIAGNOSIS — M9908 Segmental and somatic dysfunction of rib cage: Secondary | ICD-10-CM

## 2021-02-11 DIAGNOSIS — M9901 Segmental and somatic dysfunction of cervical region: Secondary | ICD-10-CM | POA: Diagnosis not present

## 2021-02-11 DIAGNOSIS — M9904 Segmental and somatic dysfunction of sacral region: Secondary | ICD-10-CM

## 2021-02-11 MED ORDER — CELECOXIB 200 MG PO CAPS: 200.0000 mg | ORAL_CAPSULE | Freq: Every day | ORAL | 0 refills | Status: DC

## 2021-02-11 NOTE — Assessment & Plan Note (Signed)

## 2021-02-11 NOTE — Patient Instructions (Addendum)
Xray today Tart Cherry Extract 1200mg  at night Vit D 2000-3000IU daily  Celebrex 200mg  daily- 3 day bursts Exercises 3x a week See me again in 4-6 weeks

## 2021-02-11 NOTE — Assessment & Plan Note (Signed)
Discussed with patient about different treatment options.  Do feel there is a possibility for spinal stenosis.  Responded extremely well to osteopathic manipulation.  Discussed posture  Discussed which activities to do which was to avoid.  Increase activity slowly.  Follow-up again in 6 to 8 weeks worsening pain or radicular symptoms we will need to consider advanced imaging or formal physical therapy again.

## 2021-02-19 ENCOUNTER — Ambulatory Visit
Admission: RE | Admit: 2021-02-19 | Discharge: 2021-02-19 | Disposition: A | Discharge status: Home / Self Care | Payer: Medicare Other | Source: Ambulatory Visit | Attending: Internal Medicine | Admitting: Internal Medicine

## 2021-02-19 ENCOUNTER — Ambulatory Visit: Payer: Medicare Other

## 2021-02-19 DIAGNOSIS — Z1231 Encounter for screening mammogram for malignant neoplasm of breast: Secondary | ICD-10-CM

## 2021-02-20 ENCOUNTER — Other Ambulatory Visit: Payer: Self-pay | Admitting: Internal Medicine

## 2021-02-24 NOTE — Progress Notes (Signed)
Chief Complaint  Patient presents with   Medication Refill    HPI: Jillian Escobar 71 y.o. come in for   Last visit with med Jan 2022  Under care  PCP thyroid replacement  taking daily due for lab Dr Tamala Julian lumbar spine pain  and has oa of  hands  Cards lipids HLD AVins  q 6 mos  Gi dyspepesia armbruster  feb 22  ROS: See pertinent positives and negatives per HPI.  Past Medical History:  Diagnosis Date   Allergy    Arthritis    GERD (gastroesophageal reflux disease)    Hx of varicella    Hyperlipidemia    borderline   Hypertension    Hypothyroidism    Pneumonia 11/2014   Only time took inhalers to treat   RBBB 10/2019   recently seen at [facility] - f/u 6 mons, no treatment   Shingles 10/26/2010   Syncope and collapse 10/2019   Unclear etiology - just saw MD - f/u 6 months - no treatment   Thyroid disease     Family History  Problem Relation Age of Onset   Heart disease Mother        stent in 77 s   COPD Father    Heart disease Father        cabg in 37s    Glaucoma Father    Dementia Father    Ulcerative colitis Sister    Ulcerative colitis Brother    Arrhythmia Daughter    Colon cancer Cousin    Colon polyps Neg Hx    Esophageal cancer Neg Hx    Rectal cancer Neg Hx    Stomach cancer Neg Hx     Social History   Socioeconomic History   Marital status: Married    Spouse name: Not on file   Number of children: 1   Years of education: Not on file   Highest education level: Associate degree: academic program  Occupational History   Not on file  Tobacco Use   Smoking status: Former    Types: Cigarettes    Quit date: 01/04/1982    Years since quitting: 39.1   Smokeless tobacco: Never   Tobacco comments:    Marijuana daily  Vaping Use   Vaping Use: Former  Substance and Sexual Activity   Alcohol use: Yes    Alcohol/week: 14.0 standard drinks    Types: 14 Standard drinks or equivalent per week    Comment: daily- 2 a night    Drug use: Yes    Types:  Marijuana    Comment: Last use 10/15/19- Marijuana Daily   Sexual activity: Yes    Birth control/protection: Post-menopausal  Other Topics Concern   Not on file  Social History Narrative   H H of 2    Married, G2P1   Former tobacco;drinks beer wine   Walks maybe 30 minutes or more days a week. Doesn't do more than that because she is "lazy"   Pet 2 dog and 2 cats .   TA [place] pre K and K   Originally from [place], moved over 20 years ago   Social Determinants of Radio broadcast assistant Strain: Low Risk    Difficulty of Paying Living Expenses: Not hard at [employer] Insecurity: No Food Insecurity   Worried About Charity fundraiser in the Last Year: Never true   Arboriculturist in the Last Year: Never true  Transportation Needs:  No Transportation Needs   Lack of Transportation (Medical): No   Lack of Transportation (Non-Medical): No  Physical Activity: Sufficiently Active   Days of Exercise per Week: 7 days   Minutes of Exercise per Session: 30 min  Stress: No Stress Concern Present   Feeling of Stress : Only a little  Social Connections: Moderately Integrated   Frequency of Communication with Friends and Family: Three times a week   Frequency of Social Gatherings with Friends and Family: Once a week   Attends Religious Services: Never   Marine scientist or Organizations: Yes   Attends Music therapist: More than 4 times per year   Marital Status: Married    Outpatient Medications Prior to Visit  Medication Sig Dispense Refill   albuterol (VENTOLIN HFA) 108 (90 Base) MCG/ACT inhaler TAKE 2 PUFFS BY MOUTH EVERY 6 HOURS AS NEEDED FOR WHEEZE OR SHORTNESS OF BREATH 6.7 each 1   atorvastatin (LIPITOR) 20 MG tablet TAKE 1 TABLET BY MOUTH EVERY DAY 90 tablet 3   calcium carbonate (OS-CAL) 600 MG TABS tablet      celecoxib (CELEBREX) 200 MG capsule Take 1 capsule (200 mg total) by mouth daily. 30 capsule 0   celecoxib (CELEBREX) 200 MG  capsule Take 200 mg by mouth daily.     Cholecalciferol (VITAMIN D PO) Take 2 tablets by mouth daily.     DUEXIS 800-26.6 MG TABS Take 1 tablet by mouth daily as needed.  2   ELDERBERRY PO daily.      esomeprazole (NEXIUM) 20 MG capsule Take 20 mg by mouth 2 (two) times daily.     famotidine (PEPCID) 20 MG tablet Take 20 mg by mouth as needed for heartburn or indigestion.     hydrochlorothiazide (MICROZIDE) 12.5 MG capsule TAKE 1 CAPSULE BY MOUTH EVERY DAY 90 capsule 3   KRILL OIL PO Take by mouth in the morning and at bedtime.     levothyroxine (SYNTHROID) 50 MCG tablet TAKE 1 TABLET BY MOUTH EVERY DAY 30 tablet 0   losartan (COZAAR) 25 MG tablet Take 25 mg by mouth daily.     Misc Natural Products (OSTEO BI-FLEX/5-LOXIN ADVANCED PO) Take by mouth 2 (two) times daily at 8 am and 10 pm.     Multiple Vitamin (MULTIVITAMIN WITH MINERALS) TABS tablet Take 1 tablet by mouth daily.      OVER THE COUNTER MEDICATION Take 1 capsule by mouth daily.     sucralfate (CARAFATE) 1 g tablet Take 1 tablet (1 g total) by mouth every 6 (six) hours as needed. 30 tablet 1   vitamin A 3 MG (10000 UNITS) capsule Take 10,000 Units by mouth daily.     No facility-administered medications prior to visit.     EXAM:  BP 134/72 (BP Location: Left Arm, Patient Position: Sitting, Cuff Size: Normal)    Pulse 63    Temp 98.5 F (36.9 C) (Oral)    Ht 5\' 3"  (1.6 m)    Wt 179 lb 6.4 oz (81.4 kg)    LMP  (LMP Unknown)    SpO2 99%    BMI 31.78 kg/m   Body mass index is 31.78 kg/m.  GENERAL: vitals reviewed and listed above, alert, oriented, appears well hydrated and in no acute distress HEENT: atraumatic, conjunctiva  clear, no obvious abnormalities on inspection of external nose and ears OP : no lesion edema or exudate  NECK: no obvious masses on inspection palpation  LUNGS: clear to auscultation bilaterally,  no wheezes, rales or rhonchi, good air movement CV: HRRR, no clubbing cyanosis or  peripheral edema nl cap  refill  MS: moves all extremities without noticeable focal  abnormality PSYCH: pleasant and cooperative, no obvious depression or anxiety Lab Results  Component Value Date   WBC 5.4 02/01/2020   HGB 14.1 02/01/2020   HCT 41.3 02/01/2020   PLT 346.0 02/01/2020   GLUCOSE 97 01/26/2021   CHOL 151 01/26/2021   TRIG 66 01/26/2021   HDL 51 01/26/2021   LDLDIRECT 158.4 08/14/2012   LDLCALC 87 01/26/2021   ALT 15 01/26/2021   AST 18 01/26/2021   NA 142 01/26/2021   K 4.0 01/26/2021   CL 105 01/26/2021   CREATININE 0.83 01/26/2021   BUN 17 01/26/2021   CO2 26 01/26/2021   TSH 2.38 08/21/2019   HGBA1C 5.4 07/26/2018   BP Readings from Last 3 Encounters:  02/25/21 134/72  02/11/21 124/82  01/28/21 116/62    ASSESSMENT AND PLAN:  Discussed the following assessment and plan:  Hypothyroidism, unspecified type  Medication management  Essential hypertension  -Patient advised to return or notify health care team  if  new concerns arise.  There are no Patient Instructions on file for this visit.   Standley Brooking. Mete Purdum M.D.

## 2021-02-25 ENCOUNTER — Ambulatory Visit (INDEPENDENT_AMBULATORY_CARE_PROVIDER_SITE_OTHER): Payer: Medicare Other | Admitting: Internal Medicine

## 2021-02-25 ENCOUNTER — Encounter: Payer: Self-pay | Admitting: Internal Medicine

## 2021-02-25 VITALS — BP 134/72 | HR 63 | Temp 98.5°F | Ht 63.0 in | Wt 179.4 lb

## 2021-02-25 DIAGNOSIS — E049 Nontoxic goiter, unspecified: Secondary | ICD-10-CM

## 2021-02-25 DIAGNOSIS — E039 Hypothyroidism, unspecified: Secondary | ICD-10-CM | POA: Diagnosis not present

## 2021-02-25 DIAGNOSIS — I10 Essential (primary) hypertension: Secondary | ICD-10-CM | POA: Diagnosis not present

## 2021-02-25 DIAGNOSIS — Z79899 Other long term (current) drug therapy: Secondary | ICD-10-CM

## 2021-02-25 LAB — CBC WITH DIFFERENTIAL/PLATELET
Basophils Absolute: 0.1 10*3/uL (ref 0.0–0.1)
Basophils Relative: 1.3 % (ref 0.0–3.0)
Eosinophils Absolute: 0.2 10*3/uL (ref 0.0–0.7)
Eosinophils Relative: 3.3 % (ref 0.0–5.0)
HCT: 41.2 % (ref 36.0–46.0)
Hemoglobin: 14.2 g/dL (ref 12.0–15.0)
Lymphocytes Relative: 41.3 % (ref 12.0–46.0)
Lymphs Abs: 2 10*3/uL (ref 0.7–4.0)
MCHC: 34.6 g/dL (ref 30.0–36.0)
MCV: 88.5 fl (ref 78.0–100.0)
Monocytes Absolute: 0.6 10*3/uL (ref 0.1–1.0)
Monocytes Relative: 12.4 % — ABNORMAL HIGH (ref 3.0–12.0)
Neutro Abs: 2 10*3/uL (ref 1.4–7.7)
Neutrophils Relative %: 41.7 % — ABNORMAL LOW (ref 43.0–77.0)
Platelets: 304 10*3/uL (ref 150.0–400.0)
RBC: 4.66 Mil/uL (ref 3.87–5.11)
RDW: 13 % (ref 11.5–15.5)
WBC: 4.8 10*3/uL (ref 4.0–10.5)

## 2021-02-25 LAB — T4, FREE: Free T4: 1.04 ng/dL (ref 0.60–1.60)

## 2021-02-25 LAB — TSH: TSH: 2.5 u[IU]/mL (ref 0.35–5.50)

## 2021-02-25 NOTE — Patient Instructions (Signed)
Good to see  you today .   Lab today  Will be contacted about  thyroid ultrasound. To check for nodules.  Intensify lifestyle interventions.

## 2021-02-25 NOTE — Progress Notes (Signed)
Chief Complaint  Patient presents with   Medication Refill    HPI: Jillian Escobar 71 y.o. come in for  med check and evaluation   Last visit with med Jan 2022  Under care  PCP :thyroid replacement  taking daily due for lab Dr Tamala Julian ::lumbar spine pain  Cards :lipids HLD AVins  q 6 mos  Gi :dyspepesia armbruster  feb 22  Ortho: hand arthritis  temp celebrex 5 days helped  Hard to lose weight  despite   exercise hard to control   ROS: See pertinent positives and negatives per HPI.  No tobacco   2 whiskey per night .  Does exercise  Past Medical History:  Diagnosis Date   Allergy    Arthritis    GERD (gastroesophageal reflux disease)    Hx of varicella    Hyperlipidemia    borderline   Hypertension    Hypothyroidism    Pneumonia 11/2014   Only time took inhalers to treat   RBBB 10/2019   [facility]ly seen at [facility] - f/u 6 mons, no treatment   Shingles 10/26/2010   Syncope and collapse 10/2019   Unclear etiology - just saw MD - f/u 6 months - no treatment   Thyroid disease     Family History  Problem Relation Age of Onset   Heart disease Mother        stent in 1 s   COPD Father    Heart disease Father        cabg in 21s    Glaucoma Father    Dementia Father    Ulcerative colitis Sister    Ulcerative colitis Brother    Arrhythmia Daughter    Colon cancer Cousin    Colon polyps Neg Hx    Esophageal cancer Neg Hx    Rectal cancer Neg Hx    Stomach cancer Neg Hx     Social History   Socioeconomic History   Marital status: Married    Spouse name: Not on file   Number of children: 1   Years of education: Not on file   Highest education level: Associate degree: academic program  Occupational History   Not on file  Tobacco Use   Smoking status: Former    Types: Cigarettes    Quit date: 01/04/1982    Years since quitting: 39.1   Smokeless tobacco: Never   Tobacco comments:    Marijuana daily  Vaping Use   Vaping Use: Former  Substance and Sexual Activity    Alcohol use: Yes    Alcohol/week: 14.0 standard drinks    Types: 14 Standard drinks or equivalent per week    Comment: daily- 2 a night    Drug use: Yes    Types: Marijuana    Comment: Last use 10/15/19- Marijuana Daily   Sexual activity: Yes    Birth control/protection: Post-menopausal  Other Topics Concern   Not on file  Social History Narrative   H H of 2    Married, G2P1   Former tobacco;drinks beer wine   Walks maybe 30 minutes or more days a week. Doesn't do more than that because she is "lazy"   Pet 2 dog and 2 cats .   TA [place] pre K [employer] K   Originally from [place], moved over 20 years ago   Social Determinants of Radio broadcast assistant Strain: Low Risk    Difficulty of Paying Living Expenses: Not hard at all  Food Insecurity: No Food Insecurity   Worried About Charity fundraiser in the Last Year: Never true   Ran Out of Food in the Last Year: Never true  Transportation Needs: No Transportation Needs   Lack of Transportation (Medical): No   Lack of Transportation (Non-Medical): No  Physical Activity: Sufficiently Active   Days of Exercise per Week: 7 days   Minutes of Exercise per Session: 30 min  Stress: No Stress Concern Present   Feeling of Stress : Only a little  Social Connections: Moderately Integrated   Frequency of Communication with Friends and Family: Three times a week   Frequency of Social Gatherings with Friends and Family: Once a week   Attends Religious Services: Never   Marine scientist or Organizations: Yes   Attends Music therapist: More than 4 times per year   Marital Status: Married    Outpatient Medications Prior to Visit  Medication Sig Dispense Refill   albuterol (VENTOLIN HFA) 108 (90 Base) MCG/ACT inhaler TAKE 2 PUFFS BY MOUTH EVERY 6 HOURS AS NEEDED FOR WHEEZE OR SHORTNESS OF BREATH 6.7 each 1   atorvastatin (LIPITOR) 20 MG tablet TAKE 1 TABLET BY MOUTH EVERY DAY 90 tablet 3    calcium carbonate (OS-CAL) 600 MG TABS tablet      celecoxib (CELEBREX) 200 MG capsule Take 1 capsule (200 mg total) by mouth daily. 30 capsule 0   celecoxib (CELEBREX) 200 MG capsule Take 200 mg by mouth daily.     Cholecalciferol (VITAMIN D PO) Take 2 tablets by mouth daily.     DUEXIS 800-26.6 MG TABS Take 1 tablet by mouth daily as needed.  2   ELDERBERRY PO daily.      esomeprazole (NEXIUM) 20 MG capsule Take 20 mg by mouth 2 (two) times daily.     famotidine (PEPCID) 20 MG tablet Take 20 mg by mouth as needed for heartburn or indigestion.     hydrochlorothiazide (MICROZIDE) 12.5 MG capsule TAKE 1 CAPSULE BY MOUTH EVERY DAY 90 capsule 3   KRILL OIL PO Take by mouth in the morning and at bedtime.     levothyroxine (SYNTHROID) 50 MCG tablet TAKE 1 TABLET BY MOUTH EVERY DAY 30 tablet 0   losartan (COZAAR) 25 MG tablet Take 25 mg by mouth daily.     Misc Natural Products (OSTEO BI-FLEX/5-LOXIN ADVANCED PO) Take by mouth 2 (two) times daily at 8 am and 10 pm.     Multiple Vitamin (MULTIVITAMIN WITH MINERALS) TABS tablet Take 1 tablet by mouth daily.      OVER THE COUNTER MEDICATION Take 1 capsule by mouth daily.     sucralfate (CARAFATE) 1 g tablet Take 1 tablet (1 g total) by mouth every 6 (six) hours as needed. 30 tablet 1   vitamin A 3 MG (10000 UNITS) capsule Take 10,000 Units by mouth daily.     No facility-administered medications prior to visit.     EXAM:  BP 134/72 (BP Location: Left Arm, Patient Position: Sitting, Cuff Size: Normal)    Pulse 63    Temp 98.5 F (36.9 C) (Oral)    Ht 5\' 3"  (1.6 m)    Wt 179 lb 6.4 oz (81.4 kg)    LMP  (LMP Unknown)    SpO2 99%    BMI 31.78 kg/m   Body mass index is 31.78 kg/m.  GENERAL: vitals reviewed and listed above, alert, oriented, appears well hydrated and in no acute distress HEENT: atraumatic,  conjunctiva  clear, no obvious abnormalities on inspection of external nose and ears OP : NECK:   skin redundancy  thryoid ? Palpated nodule (  pat thinks getting bigger )UNGS: clear to auscultation bilaterally, no wheezes, rales or rhonchi, good air movement CV: HRRR, no clubbing cyanosis or  peripheral edema nl cap refill  Abdomen:  Sof,t normal bowel sounds without hepatosplenomegaly, no guarding rebound or masses no CVA tenderness MS: moves all extremities without noticeable focal  abnormality thumb hand arthritis  PSYCH: pleasant and cooperative, no obvious depression or anxiety Lab Results  Component Value Date   WBC 5.4 02/01/2020   HGB 14.1 02/01/2020   HCT 41.3 02/01/2020   PLT 346.0 02/01/2020   GLUCOSE 97 01/26/2021   CHOL 151 01/26/2021   TRIG 66 01/26/2021   HDL 51 01/26/2021   LDLDIRECT 158.4 08/14/2012   LDLCALC 87 01/26/2021   ALT 15 01/26/2021   AST 18 01/26/2021   NA 142 01/26/2021   K 4.0 01/26/2021   CL 105 01/26/2021   CREATININE 0.83 01/26/2021   BUN 17 01/26/2021   CO2 26 01/26/2021   TSH 2.38 08/21/2019   HGBA1C 5.4 07/26/2018   BP Readings from Last 3 Encounters:  02/25/21 134/72  02/11/21 124/82  01/28/21 116/62    ASSESSMENT AND PLAN:  Discussed the following assessment and plan:  Hypothyroidism, unspecified type - Plan: TSH, T4, free, CBC with Differential/Platelet, CBC with Differential/Platelet, T4, free, TSH, US THYROID  Medication management - Plan: TSH, T4, free, CBC with Differential/Platelet, CBC with Differential/Platelet, T4, free, TSH, US THYROID  Essential hypertension - Plan: TSH, T4, free, CBC with Differential/Platelet, CBC with Differential/Platelet, T4, free, TSH  Enlarged thyroid - Plan: US THYROID NF today  if all ok then yearly check  Counseling regarding  lsi weight management  Record review specialty  , lab review   32 minutes  -Patient advised to return or notify health care team  if  new concerns arise.  Patient Instructions  Good to see  you today .   Lab today  Will be contacted about  thyroid ultrasound. To check for nodules.  Intensify lifestyle  interventions.     Standley Brooking. Othmar Ringer M.D.

## 2021-03-02 NOTE — Progress Notes (Signed)
Lab work okay thyroid test normal.  Awaiting thyroid ultrasound.

## 2021-03-03 ENCOUNTER — Ambulatory Visit
Admission: RE | Admit: 2021-03-03 | Discharge: 2021-03-03 | Disposition: A | Discharge status: Home / Self Care | Payer: Medicare Other | Source: Ambulatory Visit | Attending: Internal Medicine | Admitting: Internal Medicine

## 2021-03-03 ENCOUNTER — Other Ambulatory Visit: Payer: Self-pay | Admitting: Internal Medicine

## 2021-03-03 DIAGNOSIS — E039 Hypothyroidism, unspecified: Secondary | ICD-10-CM

## 2021-03-03 DIAGNOSIS — Z79899 Other long term (current) drug therapy: Secondary | ICD-10-CM

## 2021-03-03 DIAGNOSIS — E049 Nontoxic goiter, unspecified: Secondary | ICD-10-CM

## 2021-03-03 MED ORDER — LEVOTHYROXINE SODIUM 50 MCG PO TABS: 50.0000 ug | ORAL_TABLET | Freq: Every day | ORAL | 3 refills | Status: DC

## 2021-03-03 NOTE — Progress Notes (Signed)
Good news thyroid ultrasound does not show any nodule or seriously enlarged thyroid gland. No change in plan yearly checkup. Sending in refill thyroid med to [facility] 90 days w refill

## 2021-03-10 NOTE — Progress Notes (Signed)
?Jillian Escobar D.O. ?[facility] ?[place] ?Phone: [phone] ?Subjective:   ?I, Jillian Escobar, am serving as a scribe for Dr. Hulan Saas. ? ?This visit occurred during the SARS-CoV-2 public health emergency.  Safety protocols were in place, including screening questions prior to the visit, additional usage of staff PPE, and extensive cleaning of exam room while observing appropriate contact time as indicated for disinfecting solutions.  ? ?I'm seeing this patient by the request  of:  Panosh, Standley Brooking, MD ? ?CC:  ? ?BPZ:[id number]  ?Jillian Escobar is a 71 y.o. female coming in with complaint of back and neck pain. OMT on 02/11/2021. Patient states that she is doing a lot better.  ? ?Notes intermittent pain over medial aspect of R knee since last visit. No injury. Has had pain like this over the years. Tried Celebrex for 5 days which did not help but it did help her back.  ? ?Medications patient has been prescribed: Celebrex ? ?Taking: ? ? ?  ? ? ? ? ?Reviewed prior external information including notes and imaging from previsou exam, outside providers and external EMR if available.  ? ?As well as notes that were available from care everywhere and other healthcare systems. ? ?Past medical history, social, surgical and family history all reviewed in electronic medical record.  No pertanent information unless stated regarding to the chief complaint.  ? ?Past Medical History:  ?Diagnosis Date  ? Allergy   ? Arthritis   ? GERD (gastroesophageal reflux disease)   ? Hx of varicella   ? Hyperlipidemia   ? borderline  ? Hypertension   ? Hypothyroidism   ? Pneumonia 11/2014  ? Only time took inhalers to treat  ? RBBB 10/2019  ? recently seen at [facility] - f/u 6 mons, no treatment  ? Shingles 10/26/2010  ? Syncope and collapse 10/2019  ? Unclear etiology - just saw MD - f/u 6 months - no treatment  ? Thyroid disease   ?  ?Allergies  ?Allergen Reactions  ? Levaquin [Levofloxacin]   ?  See  notes  September 2019  Palpitations irritability sleep issues    ? ? ? ?Review of Systems: ? No headache, visual changes, nausea, vomiting, diarrhea, constipation, dizziness, abdominal pain, skin rash, fevers, chills, night sweats, weight loss, swollen lymph nodes, body aches, joint swelling, chest pain, shortness of breath, mood changes. POSITIVE muscle aches ? ?Objective  ?Blood pressure 112/76, pulse 81, height '5\' 3"'$  (1.6 m), weight 178 lb (80.7 kg), SpO2 98 %. ?  ?General: No apparent distress alert and oriented x3 mood and affect normal, dressed appropriately.  ?HEENT: Pupils equal, extraocular movements intact  ?Respiratory: Patient's speak in full sentences and does not appear short of breath  ?Cardiovascular: No lower extremity edema, non tender, no erythema  ?Back exam does have some loss of lordosis.  Tenderness to palpation in the thoracolumbar juncture.  Mild increase in kyphosis of the upper back.  Tightness in the left parascapular region. ? ?Right knee exam shows the patient is tender to palpation just inferior to the medial joint line, mild tightness with hamstring compared to the contralateral side.  The patient has relatively good range of motion with very mild crepitus of the patellofemoral joint.  No instability noted of the knee. ? ? ?Osteopathic findings ? ?C2 flexed rotated and side bent right ?C6 flexed rotated and side bent left ?T3 extended rotated and side bent left inhaled rib ?T9 extended rotated and  side bent left ?L4 flexed rotated and side bent left ?Sacrum right on right ? ? ? ? ?  ?Assessment and Plan: ? ?Degenerative disc disease, lumbar ?Patient is making relatively good improvement already at this point.  Discussed icing regimen and home exercise, discussed which activities to do and which ones to avoid.  Increase activity slowly. ?Patient does have tightness.  Still.  Is responding well though to osteopathic manipulation.  Follow-up with me again in 6 to 8 weeks has Celebrex for  breakthrough pain. ? ?Pes anserine bursitis ?Tender.  ?Discussed HEP  ?Thigh compression  ?hele lift ?RTC in 6 weeks   ? ?Nonallopathic problems ? ?Decision today to treat with OMT was based on Physical Exam ? ?After verbal consent patient was treated with HVLA, ME, FPR techniques in cervical, rib, thoracic, lumbar, and sacral  areas ? ?Patient tolerated the procedure well with improvement in symptoms ? ?Patient given exercises, stretches and lifestyle modifications ? ?See medications in patient instructions if given ? ?Patient will follow up in 4-8 weeks ? ?  ? ? ?The above documentation has been reviewed and is accurate and complete [name], DO ? ? ? ?  ? ? Note: This dictation was prepared with Dragon dictation along with smaller phrase technology. Any transcriptional errors that result from this process are unintentional.    ?  ?  ? ?

## 2021-03-11 ENCOUNTER — Other Ambulatory Visit: Payer: Self-pay

## 2021-03-11 ENCOUNTER — Ambulatory Visit (INDEPENDENT_AMBULATORY_CARE_PROVIDER_SITE_OTHER): Payer: Medicare Other

## 2021-03-11 ENCOUNTER — Ambulatory Visit (INDEPENDENT_AMBULATORY_CARE_PROVIDER_SITE_OTHER): Payer: Medicare Other | Admitting: Family Medicine

## 2021-03-11 VITALS — BP 112/76 | HR 81 | Ht 63.0 in | Wt 178.0 lb

## 2021-03-11 DIAGNOSIS — M5136 Other intervertebral disc degeneration, lumbar region: Secondary | ICD-10-CM | POA: Diagnosis not present

## 2021-03-11 DIAGNOSIS — M9908 Segmental and somatic dysfunction of rib cage: Secondary | ICD-10-CM

## 2021-03-11 DIAGNOSIS — M9901 Segmental and somatic dysfunction of cervical region: Secondary | ICD-10-CM

## 2021-03-11 DIAGNOSIS — G8929 Other chronic pain: Secondary | ICD-10-CM | POA: Diagnosis not present

## 2021-03-11 DIAGNOSIS — M25561 Pain in right knee: Secondary | ICD-10-CM | POA: Diagnosis not present

## 2021-03-11 DIAGNOSIS — M9903 Segmental and somatic dysfunction of lumbar region: Secondary | ICD-10-CM | POA: Diagnosis not present

## 2021-03-11 DIAGNOSIS — M9904 Segmental and somatic dysfunction of sacral region: Secondary | ICD-10-CM

## 2021-03-11 DIAGNOSIS — M9902 Segmental and somatic dysfunction of thoracic region: Secondary | ICD-10-CM

## 2021-03-11 DIAGNOSIS — M7050 Other bursitis of knee, unspecified knee: Secondary | ICD-10-CM | POA: Diagnosis not present

## 2021-03-11 NOTE — Assessment & Plan Note (Signed)
Tender.  ?Discussed HEP  ?Thigh compression  ?hele lift ?RTC in 6 weeks  ?

## 2021-03-11 NOTE — Assessment & Plan Note (Signed)
Patient is making relatively good improvement already at this point.  Discussed icing regimen and home exercise, discussed which activities to do and which ones to avoid.  Increase activity slowly. ?Patient does have tightness.  Still.  Is responding well though to osteopathic manipulation.  Follow-up with me again in 6 to 8 weeks has Celebrex for breakthrough pain. ?

## 2021-03-11 NOTE — Patient Instructions (Addendum)
Xray today ?Exercises 3 times a week.  ?Thigh compression sleeve with activity  ?Heel lift in the shoe but very small  ?See me again in 8 weeks! ?

## 2021-03-14 ENCOUNTER — Encounter: Payer: Self-pay | Admitting: Cardiology

## 2021-03-14 NOTE — Assessment & Plan Note (Signed)
Blood pressure well controlled on low-dose losartan 12.5 mg and-HCTZ 12.5 mg  ? ?

## 2021-03-14 NOTE — Assessment & Plan Note (Signed)
Stable mild to moderate AI on echo.  Okay to follow-up again in 2025. ?

## 2021-03-14 NOTE — Assessment & Plan Note (Signed)
Continue blood pressure and lipid control. ? ?We talked about potentially ordering coronary calcium score however her lipids are pretty well controlled with an last LDL documented at 67 on current dose of atorvastatin.  Her blood pressure is well controlled.  Perhaps only additional therapy would be suggesting aspirin if there is elevated coronary calcium score. ? ?As such, we agreed that we would hold off on testing at this point. ?

## 2021-03-14 NOTE — Assessment & Plan Note (Signed)
Doing well.  Well-controlled without beta-blockers.  She simply has reduced her triggers such as caffeine and sweets.  Also maintain adequate hydration. ?

## 2021-03-25 ENCOUNTER — Other Ambulatory Visit (HOSPITAL_BASED_OUTPATIENT_CLINIC_OR_DEPARTMENT_OTHER): Payer: Self-pay

## 2021-03-25 MED ORDER — ZOSTER VAC RECOMB ADJUVANTED 50 MCG/0.5ML IM SUSR
INTRAMUSCULAR | 1 refills | Status: DC
  Filled 2021-03-25: qty 0.5, 1d supply, fill #0

## 2021-04-04 ENCOUNTER — Other Ambulatory Visit: Payer: Self-pay | Admitting: Internal Medicine

## 2021-04-06 ENCOUNTER — Telehealth: Payer: Self-pay | Admitting: Internal Medicine

## 2021-04-06 ENCOUNTER — Other Ambulatory Visit: Payer: Self-pay

## 2021-04-06 MED ORDER — LOSARTAN POTASSIUM 25 MG PO TABS: 25.0000 mg | ORAL_TABLET | Freq: Every day | ORAL | 0 refills | Status: DC

## 2021-04-06 NOTE — Telephone Encounter (Signed)
Willing to accept if I have seen family member in recent years ?

## 2021-04-06 NOTE — Telephone Encounter (Signed)
Patient called.

## 2021-04-06 NOTE — Telephone Encounter (Signed)
Pt is asking to become a pt of Dr Yong Channel due to her mother was a previous pt before she passed. Please advise ?

## 2021-04-06 NOTE — Telephone Encounter (Signed)
Rx sent to the pharmacy.

## 2021-04-06 NOTE — Telephone Encounter (Signed)
Patient called to get refill on losartan (COZAAR) 25 MG tablet ? ? ? ? ? ?[facility] to [id number] [place]:  [phone] ?Fax:  [phone] ?  ? ? ? ? ? ? ? ?Please advise  ?

## 2021-04-07 NOTE — Telephone Encounter (Signed)
Pt is wanting to transfer to Dr Yong Channel. Her mother was a previous patient of his.  ?

## 2021-04-07 NOTE — Telephone Encounter (Signed)
Please notate who patients mother was in appt notes.  ?

## 2021-04-13 NOTE — Telephone Encounter (Signed)
Ok with me 

## 2021-04-29 NOTE — Progress Notes (Signed)
?Jillian Escobar D.O. ?[facility] ?[place] ?Phone: [phone] ?Subjective:   ?I, Jillian Escobar, am serving as a scribe for Dr. Hulan Escobar. ? ?This visit occurred during the SARS-CoV-2 public health emergency.  Safety protocols were in place, including screening questions prior to the visit, additional usage of staff PPE, and extensive cleaning of exam room while observing appropriate contact time as indicated for disinfecting solutions.  ? ? ?I'm seeing this patient by the request  of:  Escobar, Jillian Brooking, MD ? ?CC: Low back pain follow-up ? ?XTK:[id number]  ?Jillian Escobar is a 71 y.o. female coming in with complaint of back and neck pain. OMT 03/11/2021. Also f/u R pes anserine knee pain. Patient states that her neck is stiff as she started working for the month of April. Feels that her knee is doing ok. Just started exercising yesterday.  Patient has not been doing quite the exercises that she was been doing previously. ? ?Medications patient has been prescribed: Synthroid ? ?Taking: Yes ? ? ?  ? ? ? ? ?Reviewed prior external information including notes and imaging from previsou exam, outside providers and external EMR if available.  ? ?As well as notes that were available from care everywhere and other healthcare systems. ? ?Past medical history, social, surgical and family history all reviewed in electronic medical record.  No pertanent information unless stated regarding to the chief complaint.  ? ?Past Medical History:  ?Diagnosis Date  ? Allergy   ? Arthritis   ? GERD (gastroesophageal reflux disease)   ? Hx of varicella   ? Hyperlipidemia   ? borderline  ? Hypertension   ? Hypothyroidism   ? Pneumonia 11/2014  ? Only time took inhalers to treat  ? RBBB 10/2019  ? recently seen at [facility] - f/u 6 mons, no treatment  ? Shingles 10/26/2010  ? Syncope and collapse 10/2019  ? Unclear etiology - just saw MD - f/u 6 months - no treatment  ? Thyroid disease   ?  ?Allergies   ?Allergen Reactions  ? Levaquin [Levofloxacin]   ?  See notes  September 2019  Palpitations irritability sleep issues    ? ? ? ?Review of Systems: ? No headache, visual changes, nausea, vomiting, diarrhea, constipation, dizziness, abdominal pain, skin rash, fevers, chills, night sweats, weight loss, swollen lymph nodes, body aches, joint swelling, chest pain, shortness of breath, mood changes. POSITIVE muscle aches ? ?Objective  ?Blood pressure 128/72, pulse 78, height '5\' 3"'$  (1.6 m), weight 189 lb (85.7 kg), SpO2 98 %. ?  ?General: No apparent distress alert and oriented x3 mood and affect normal, dressed appropriately.  ?HEENT: Pupils equal, extraocular movements intact  ?Respiratory: Patient's speak in full sentences and does not appear short of breath  ?Cardiovascular: No lower extremity edema, non tender, no erythema  ?Neuro: Cranial nerves II through XII are intact, neurovascularly intact in all extremities with 2+ DTRs and 2+ pulses.  ?Mild increase in tightness noted of the paraspinal musculature.  Patient does have a mild kyphosis of the back.  Patient does have tightness of the scapular area right greater than left.  Low back does have tightness with FABER test bilaterally 2. ?Osteopathic findings ? ?C2 flexed rotated and side bent right ?C4 flexed rotated and side bent left ?T4 extended rotated and side bent right inhaled rib ?T9 extended rotated and side bent left ?L2 flexed rotated and side bent right ?L4 flexed rotated and side bent left ?Sacrum  right on right ? ? ? ? ?  ?Assessment and Plan: ? ?Degenerative disc disease, lumbar ?Patient does have low back loss of lordosis.  Tenderness to palpation in the paraspinal musculature.  Discussed which activities to do and which ones to avoid, increase activity slowly.  Follow-up again in 6 to 8 weeks patient will now be able to do more of her conservative therapy otherwise.  This is an exacerbation of a chronic problem.  Has had Duexis as well as Celebrex in  the past ? ?Pes anserine bursitis ?Discussed with patient that if worsening pain will consider the possibility of injection at follow-up.  ? ?Nonallopathic problems ? ?Decision today to treat with OMT was based on Physical Exam ? ?After verbal consent patient was treated with HVLA, ME, FPR techniques in cervical, rib, thoracic, lumbar, and sacral  areas ? ?Patient tolerated the procedure well with improvement in symptoms ? ?Patient given exercises, stretches and lifestyle modifications ? ?See medications in patient instructions if given ? ?Patient will follow up in 4-8 weeks ? ?  ? ?The above documentation has been reviewed and is accurate and complete Jillian Pulley, DO ? ? ? ?  ? ? Note: This dictation was prepared with Dragon dictation along with smaller phrase technology. Any transcriptional errors that result from this process are unintentional.    ?  ?  ? ?

## 2021-04-30 ENCOUNTER — Ambulatory Visit (INDEPENDENT_AMBULATORY_CARE_PROVIDER_SITE_OTHER): Payer: Medicare Other | Admitting: Family Medicine

## 2021-04-30 VITALS — BP 128/72 | HR 78 | Ht 63.0 in | Wt 189.0 lb

## 2021-04-30 DIAGNOSIS — M9904 Segmental and somatic dysfunction of sacral region: Secondary | ICD-10-CM | POA: Diagnosis not present

## 2021-04-30 DIAGNOSIS — M5136 Other intervertebral disc degeneration, lumbar region: Secondary | ICD-10-CM

## 2021-04-30 DIAGNOSIS — M7050 Other bursitis of knee, unspecified knee: Secondary | ICD-10-CM

## 2021-04-30 DIAGNOSIS — M9901 Segmental and somatic dysfunction of cervical region: Secondary | ICD-10-CM | POA: Diagnosis not present

## 2021-04-30 DIAGNOSIS — M9902 Segmental and somatic dysfunction of thoracic region: Secondary | ICD-10-CM | POA: Diagnosis not present

## 2021-04-30 DIAGNOSIS — M9908 Segmental and somatic dysfunction of rib cage: Secondary | ICD-10-CM

## 2021-04-30 DIAGNOSIS — M9903 Segmental and somatic dysfunction of lumbar region: Secondary | ICD-10-CM

## 2021-04-30 NOTE — Assessment & Plan Note (Signed)
Patient does have low back loss of lordosis.  Tenderness to palpation in the paraspinal musculature.  Discussed which activities to do and which ones to avoid, increase activity slowly.  Follow-up again in 6 to 8 weeks patient will now be able to do more of her conservative therapy otherwise.  This is an exacerbation of a chronic problem.  Has had Duexis as well as Celebrex in the past ?

## 2021-04-30 NOTE — Assessment & Plan Note (Signed)
Discussed with patient that if worsening pain will consider the possibility of injection at follow-up. ?

## 2021-04-30 NOTE — Patient Instructions (Signed)
Good to see you as always ?Have a great class ? Enjoy [place]  ?See me again in 6 ish weeks ?

## 2021-05-04 ENCOUNTER — Ambulatory Visit (INDEPENDENT_AMBULATORY_CARE_PROVIDER_SITE_OTHER): Payer: Medicare Other

## 2021-05-04 VITALS — Ht 63.0 in | Wt 176.0 lb

## 2021-05-04 DIAGNOSIS — Z0000 Encounter for general adult medical examination without abnormal findings: Secondary | ICD-10-CM | POA: Diagnosis not present

## 2021-05-04 NOTE — Patient Instructions (Signed)
Jillian Escobar , ?Thank you for taking time to come for your Medicare Wellness Visit. I appreciate your ongoing commitment to your health goals. Please review the following plan we discussed and let me know if I can assist you in the future.  ? ?Screening recommendations/referrals: ?Colonoscopy: completed 10/29/2019, due 10/28/2029 ?Mammogram: completed 02/19/2021, due 02/20/2022 ?Bone Density: completed 02/19/2019 ?Recommended yearly ophthalmology/optometry visit for glaucoma screening and checkup ?Recommended yearly dental visit for hygiene and checkup ? ?Vaccinations: ?Influenza vaccine: due 08/04/2021 ?Pneumococcal vaccine: completed 08/21/2019 ?Tdap vaccine: due ?Shingles vaccine: needs second dose   ?Covid-19: 10/15/2020, 10/02/2019, 02/16/2019, 01/26/2019 ? ?Advanced directives: Please bring a copy of your POA (Power of Attorney) and/or Living Will to your next appointment.  ? ?Conditions/risks identified: none ? ?Next appointment: Follow up in one year for your annual wellness visit  ? ? ?Preventive Care 69 Years and Older, Female ?Preventive care refers to lifestyle choices and visits with your health care provider that can promote health and wellness. ?What does preventive care include? ?A yearly physical exam. This is also called an annual well check. ?Dental exams once or twice a year. ?Routine eye exams. Ask your health care provider how often you should have your eyes checked. ?Personal lifestyle choices, including: ?Daily care of your teeth and gums. ?Regular physical activity. ?Eating a healthy diet. ?Avoiding tobacco and drug use. ?Limiting alcohol use. ?Practicing safe sex. ?Taking low-dose aspirin every day. ?Taking vitamin and mineral supplements as recommended by your health care provider. ?What happens during an annual well check? ?The services and screenings done by your health care provider during your annual well check will depend on your age, overall health, lifestyle risk factors, and family history  of disease. ?Counseling  ?Your health care provider may ask you questions about your: ?Alcohol use. ?Tobacco use. ?Drug use. ?Emotional well-being. ?Home and relationship well-being. ?Sexual activity. ?Eating habits. ?History of falls. ?Memory and ability to understand (cognition). ?Work and work Statistician. ?Reproductive health. ?Screening  ?You may have the following tests or measurements: ?Height, weight, and BMI. ?Blood pressure. ?Lipid and cholesterol levels. These may be checked every 5 years, or more frequently if you are over 63 years old. ?Skin check. ?Lung cancer screening. You may have this screening every year starting at age 58 if you have a 30-pack-year history of smoking and currently smoke or have quit within the past 15 years. ?Fecal occult blood test (FOBT) of the stool. You may have this test every year starting at age 19. ?Flexible sigmoidoscopy or colonoscopy. You may have a sigmoidoscopy every 5 years or a colonoscopy every 10 years starting at age 67. ?Hepatitis C blood test. ?Hepatitis B blood test. ?Sexually transmitted disease (STD) testing. ?Diabetes screening. This is done by checking your blood sugar (glucose) after you have not eaten for a while (fasting). You may have this done every 1-3 years. ?Bone density scan. This is done to screen for osteoporosis. You may have this done starting at age 10. ?Mammogram. This may be done every 1-2 years. Talk to your health care provider about how often you should have regular mammograms. ?Talk with your health care provider about your test results, treatment options, and if necessary, the need for more tests. ?Vaccines  ?Your health care provider may recommend certain vaccines, such as: ?Influenza vaccine. This is recommended every year. ?Tetanus, diphtheria, and acellular pertussis (Tdap, Td) vaccine. You may need a Td booster every 10 years. ?Zoster vaccine. You may need this after age 34. ?Pneumococcal 13-valent conjugate (  PCV13) vaccine. One  dose is recommended after age 66. ?Pneumococcal polysaccharide (PPSV23) vaccine. One dose is recommended after age 35. ?Talk to your health care provider about which screenings and vaccines you need and how often you need them. ?This information is not intended to replace advice given to you by your health care provider. Make sure you discuss any questions you have with your health care provider. ?Document Released: 01/17/2015 Document Revised: 09/10/2015 Document Reviewed: 10/22/2014 ?Elsevier Interactive Patient Education ? 2017 [place]. ? ?Fall Prevention in the Home ?Falls can cause injuries. They can happen to people of all ages. There are many things you can do to make your home safe and to help prevent falls. ?What can I do on the outside of my home? ?Regularly fix the edges of walkways and driveways and fix any cracks. ?Remove anything that might make you trip as you walk through a door, such as a raised step or threshold. ?Trim any bushes or trees on the path to your home. ?Use bright outdoor lighting. ?Clear any walking paths of anything that might make someone trip, such as rocks or tools. ?Regularly check to see if handrails are loose or broken. Make sure that both sides of any steps have handrails. ?Any raised decks and porches should have guardrails on the edges. ?Have any leaves, snow, or ice cleared regularly. ?Use sand or salt on walking paths during winter. ?Clean up any spills in your garage right away. This includes oil or grease spills. ?What can I do in the bathroom? ?Use night lights. ?Install grab bars by the toilet and in the tub and shower. Do not use towel bars as grab bars. ?Use non-skid mats or decals in the tub or shower. ?If you need to sit down in the shower, use a plastic, non-slip stool. ?Keep the floor dry. Clean up any water that spills on the floor as soon as it happens. ?Remove soap buildup in the tub or shower regularly. ?Attach bath mats securely with double-sided  non-slip rug tape. ?Do not have throw rugs and other things on the floor that can make you trip. ?What can I do in the bedroom? ?Use night lights. ?Make sure that you have a light by your bed that is easy to reach. ?Do not use any sheets or blankets that are too big for your bed. They should not hang down onto the floor. ?Have a firm chair that has side arms. You can use this for support while you get dressed. ?Do not have throw rugs and other things on the floor that can make you trip. ?What can I do in the kitchen? ?Clean up any spills right away. ?Avoid walking on wet floors. ?Keep items that you use a lot in easy-to-reach places. ?If you need to reach something above you, use a strong step stool that has a grab bar. ?Keep electrical cords out of the way. ?Do not use floor polish or wax that makes floors slippery. If you must use wax, use non-skid floor wax. ?Do not have throw rugs and other things on the floor that can make you trip. ?What can I do with my stairs? ?Do not leave any items on the stairs. ?Make sure that there are handrails on both sides of the stairs and use them. Fix handrails that are broken or loose. Make sure that handrails are as long as the stairways. ?Check any carpeting to make sure that it is firmly attached to the stairs. Fix any carpet that is  loose or worn. ?Avoid having throw rugs at the top or bottom of the stairs. If you do have throw rugs, attach them to the floor with carpet tape. ?Make sure that you have a light switch at the top of the stairs and the bottom of the stairs. If you do not have them, ask someone to add them for you. ?What else can I do to help prevent falls? ?Wear shoes that: ?Do not have high heels. ?Have rubber bottoms. ?Are comfortable and fit you well. ?Are closed at the toe. Do not wear sandals. ?If you use a stepladder: ?Make sure that it is fully opened. Do not climb a closed stepladder. ?Make sure that both sides of the stepladder are locked into place. ?Ask  someone to hold it for you, if possible. ?Clearly mark and make sure that you can see: ?Any grab bars or handrails. ?First and last steps. ?Where the edge of each step is. ?Use tools that help you move arou

## 2021-05-04 NOTE — Progress Notes (Signed)
?I connected with Jillian Escobar today by telephone and verified that I am speaking with the correct person using two identifiers. ?Location patient: home ?Location provider: work ?Persons participating in the virtual visit: Jillian Escobar, [place] LPN. ?  ?I discussed the limitations, risks, security and privacy concerns of performing an evaluation and management service by telephone and the availability of in person appointments. I also discussed with the patient that there may be a patient responsible charge related to this service. The patient expressed understanding and verbally consented to this telephonic visit.  ?  ?Interactive audio and video telecommunications were attempted between this provider and patient, however failed, due to patient having technical difficulties OR patient did not have access to video capability.  We continued and completed visit with audio only. ? ?  ? ?Vital signs may be patient reported or missing. ? ?Subjective:  ? Jillian Escobar is a 71 y.o. female who presents for Medicare Annual (Subsequent) preventive examination. ? ?Review of Systems    ? ?Cardiac Risk Factors include: advanced age (>53mn, >>29women);hypertension;obesity (BMI >30kg/m2) ? ?   ?Objective:  ?  ?Today's Vitals  ? 05/04/21 [id number]/01/23 [id number]  ?Weight: 176 lb (79.8 kg)   ?Height: '5\' 3"'$  (1.6 m)   ?PainSc:  5   ? ?Body mass index is 31.18 kg/m?. ? ? ?  05/04/2021  ? 10:04 AM 03/19/2020  ?  2:17 PM 10/17/2017  ?  9:27 AM 12/09/2015  ?  6:42 AM 10/08/2015  ?  9:25 AM 01/05/2015  ?  1:35 AM  ?Advanced Directives  ?Does Patient Have a Medical Advance Directive? Yes Yes Yes Yes Yes Yes  ?Type of AParamedicof AKnoxLiving will [place]arleyLiving will Living will;Healthcare Power of [place]villeLiving will [place]Living will [place]obbinsvilleLiving will  ?Does patient want to make changes to medical advance  directive?  No - Patient declined      ?Copy of HM[place]in Chart? No - copy requested No - copy requested No - copy requested Yes    ? ? ?Current Medications (verified) ?Outpatient Encounter Medications as of 05/04/2021  ?Medication Sig  ? albuterol (VENTOLIN HFA) 108 (90 Base) MCG/ACT inhaler TAKE 2 PUFFS BY MOUTH EVERY 6 HOURS AS NEEDED FOR WHEEZE OR SHORTNESS OF BREATH  ? atorvastatin (LIPITOR) 20 MG tablet TAKE 1 TABLET BY MOUTH EVERY DAY  ? calcium carbonate (OS-CAL) 600 MG TABS tablet   ? celecoxib (CELEBREX) 200 MG capsule Take 1 capsule (200 mg total) by mouth daily.  ? Cholecalciferol (VITAMIN D PO) Take 2 tablets by mouth daily.  ? DUEXIS 800-26.6 MG TABS Take 1 tablet by mouth daily as needed.  ? ELDERBERRY PO daily.   ? esomeprazole (NEXIUM) 20 MG capsule Take 20 mg by mouth 2 (two) times daily.  ? famotidine (PEPCID) 20 MG tablet Take 20 mg by mouth as needed for heartburn or indigestion.  ? hydrochlorothiazide (MICROZIDE) 12.5 MG capsule TAKE 1 CAPSULE BY MOUTH EVERY DAY  ? KRILL OIL PO Take by mouth in the morning and at bedtime.  ? levothyroxine (SYNTHROID) 50 MCG tablet Take 1 tablet (50 mcg total) by mouth daily.  ? losartan (COZAAR) 25 MG tablet Take 1 tablet (25 mg total) by mouth daily.  ? Misc Natural Products (OSTEO BI-FLEX/5-LOXIN ADVANCED PO) Take by mouth 2 (two) times daily at 8 am and 10 pm.  ? Multiple Vitamin (MULTIVITAMIN WITH  MINERALS) TABS tablet Take 1 tablet by mouth daily.   ? sucralfate (CARAFATE) 1 g tablet Take 1 tablet (1 g total) by mouth every 6 (six) hours as needed.  ? TART CHERRY PO Take 1 tablet by mouth daily.  ? Turmeric 500 MG TABS Take 1 tablet by mouth 2 (two) times daily.  ? vitamin A 3 MG (10000 UNITS) capsule Take 10,000 Units by mouth daily.  ? celecoxib (CELEBREX) 200 MG capsule Take 200 mg by mouth daily.  ? OVER THE COUNTER MEDICATION Take 1 capsule by mouth daily.  ? Zoster Vaccine Adjuvanted [facility]) injection Inject into the muscle.   ? ?No facility-administered encounter medications on file as of 05/04/2021.  ? ? ?Allergies (verified) ?Levaquin [levofloxacin]  ? ?History: ?Past Medical History:  ?Diagnosis Date  ? Allergy   ? Arthritis   ? GERD (gastroesophageal reflux disease)   ? Hx of varicella   ? Hyperlipidemia   ? borderline  ? Hypertension   ? Hypothyroidism   ? Pneumonia 11/2014  ? Only time took inhalers to treat  ? RBBB 10/2019  ? [facility] seen at [facility] - f/u 6 mons, no treatment  ? Shingles 10/26/2010  ? Syncope and collapse 10/2019  ? Unclear etiology - just saw MD - f/u 6 months - no treatment  ? Thyroid disease   ? ?Past Surgical History:  ?Procedure Laterality Date  ? CESAREAN SECTION    ? x1  ? CHOLECYSTECTOMY    ? COLONOSCOPY  2011  ? tics, no polyps - recall 5 yr  ? ESOPHAGOGASTRODUODENOSCOPY (EGD) WITH PROPOFOL N/A 12/09/2015  ? Procedure: ESOPHAGOGASTRODUODENOSCOPY (EGD) WITH PROPOFOL;  Surgeon: Manus Gunning, MD;  Location: [facility];  Service: Gastroenterology;  Laterality: N/A;  ? EXPLORATORY LAPAROTOMY    ? Gallbladder  ? POLYPECTOMY N/A 12/09/2015  ? Procedure: POLYPECTOMY;  Surgeon: Manus Gunning, MD;  Location: [facility];  Service: Gastroenterology;  Laterality: N/A;  ? TRANSTHORACIC ECHOCARDIOGRAM  10/07/2020  ? EF 60 to 65%.  Mild concentric LVH.  GRII DD.  Mild to moderate AI.  Normal PAP.  Normal RAP.  Normal RAP.  Stable AI  ? TUBAL LIGATION    ? WISDOM TOOTH EXTRACTION    ? ?Family History  ?Problem Relation Age of Onset  ? Heart disease Mother   ?     stent in 50 s  ? COPD Father   ? Heart disease Father   ?     cabg in 65s   ? Glaucoma Father   ? Dementia Father   ? Ulcerative colitis Sister   ? Ulcerative colitis Brother   ? Arrhythmia Daughter   ? Colon cancer Cousin   ? Colon polyps Neg Hx   ? Esophageal cancer Neg Hx   ? Rectal cancer Neg Hx   ? Stomach cancer Neg Hx   ? ?Social History  ? ?Socioeconomic History  ? Marital status: Married  ?  Spouse name: Not on file  ? Number of  children: 1  ? Years of education: Not on file  ? Highest education level: Associate degree: academic program  ?Occupational History  ? Not on file  ?Tobacco Use  ? Smoking status: Former  ?  Types: Cigarettes  ?  Quit date: 01/04/1982  ?  Years since quitting: 39.3  ? Smokeless tobacco: Never  ? Tobacco comments:  ?  Marijuana daily  ?Vaping Use  ? Vaping Use: Former  ?Substance and Sexual Activity  ? Alcohol use: Yes  ?  Alcohol/week: 14.0 standard drinks  ?  Types: 14 Standard drinks or equivalent per week  ?  Comment: daily- 2 a night   ? Drug use: Yes  ?  Types: Marijuana  ?  Comment: Last use 10/15/19- Marijuana Daily  ? Sexual activity: Yes  ?  Birth control/protection: Post-menopausal  ?Other Topics Concern  ? Not on file  ?Social History Narrative  ? H H of 2   ? Married, G2P1  ? Former tobacco;drinks beer wine  ? Walks maybe 30 minutes or more days a week. Doesn't do more than that because she is "lazy"  ? Pet 2 dog and 2 cats .  ? [place] pre K and K  ? Originally from [place], moved over 20 years ago  ? ?Social Determinants of Health  ? ?Financial Resource Strain: Low Risk   ? Difficulty of Paying Living Expenses: Not hard at all  ?Food Insecurity: No Food Insecurity  ? Worried About Charity fundraiser in the Last Year: Never true  ? Ran Out of Food in the Last Year: Never true  ?Transportation Needs: No Transportation Needs  ? Lack of Transportation (Medical): No  ? Lack of Transportation (Non-Medical): No  ?Physical Activity: Sufficiently Active  ? Days of Exercise per Week: 7 days  ? Minutes of Exercise per Session: 30 min  ?Stress: No Stress Concern Present  ? Feeling of Stress : Not at all  ?Social Connections: Moderately Integrated  ? Frequency of Communication with Friends and Family: Three times a week  ? Frequency of Social Gatherings with Friends and Family: Once a week  ? Attends Religious Services: Never  ? Active Member of Clubs or Organizations: Yes  ? Attends English as a second language teacher Meetings: More than 4 times per year  ? Marital Status: Married  ? ? ?Tobacco Counseling ?Counseling given: Not Answered ?Tobacco comments: Marijuana daily ? ? ?Clinical Intake: ? ?Pre-visit preparation

## 2021-05-08 ENCOUNTER — Other Ambulatory Visit: Payer: Self-pay | Admitting: Family Medicine

## 2021-05-08 ENCOUNTER — Encounter: Payer: Self-pay | Admitting: Family Medicine

## 2021-05-08 ENCOUNTER — Ambulatory Visit (INDEPENDENT_AMBULATORY_CARE_PROVIDER_SITE_OTHER): Payer: Medicare Other | Admitting: Family Medicine

## 2021-05-08 VITALS — BP 126/70 | HR 84 | Temp 98.0°F | Ht 63.0 in | Wt 178.0 lb

## 2021-05-08 DIAGNOSIS — E039 Hypothyroidism, unspecified: Secondary | ICD-10-CM | POA: Diagnosis not present

## 2021-05-08 DIAGNOSIS — E785 Hyperlipidemia, unspecified: Secondary | ICD-10-CM | POA: Insufficient documentation

## 2021-05-08 DIAGNOSIS — I10 Essential (primary) hypertension: Secondary | ICD-10-CM

## 2021-05-08 MED ORDER — IBUPROFEN-FAMOTIDINE 800-26.6 MG PO TABS: 1.0000 | ORAL_TABLET | Freq: Every day | ORAL | 3 refills | Status: DC | PRN

## 2021-05-08 NOTE — Progress Notes (Signed)
?Phone: [phone] ?  ?Subjective:  ?Patient presents today to establish care with me as their new primary care provider. Patient was formerly a patient of Dr. Regis Bill.  ?Chief Complaint  ?Patient presents with  ? Transitions Of Care  ?  Pt is here to tranfer care from Dr. Regis Bill.  ? ?See problem oriented charting ? ?The following were reviewed and entered/updated in epic: ?Past Medical History:  ?Diagnosis Date  ? Allergy   ? Arthritis   ? GERD (gastroesophageal reflux disease)   ? Hx of varicella   ? Hyperlipidemia   ? borderline  ? Hypertension   ? Hypothyroidism   ? Pneumonia 11/2014  ? Only time took inhalers to treat  ? RBBB 10/2019  ? recently seen [facility] - f/u 6 mons, no treatment  ? Shingles 10/26/2010  ? Syncope and collapse 10/2019  ? Unclear etiology - just saw [facility] - f/u 6 months - no treatment  ? Thyroid disease   ? ?Patient Active Problem List  ? Diagnosis Date Noted  ? Hyperlipidemia, unspecified 05/08/2021  ?  Priority: Medium   ? Aortic valve insufficiency 05/01/2020  ?  Priority: Medium   ? Symptomatic PVCs 05/14/2019  ?  Priority: Medium   ? Essential hypertension 01/11/2015  ?  Priority: Medium   ? Fasting hyperglycemia 03/04/2014  ?  Priority: Medium   ? Hepatic steatosis mild 11/23/2013  ?  Priority: Medium   ? BPPV (benign paroxysmal positional vertigo) 11/10/2012  ?  Priority: Medium   ? Family history of Alzheimer's disease 08/10/2011  ?  Priority: Medium   ? Hypothyroidism 01/22/2008  ?  Priority: Medium   ? GERD 01/22/2008  ?  Priority: Medium   ? Pes anserine bursitis 03/11/2021  ?  Priority: Low  ? Degenerative disc disease, lumbar 02/11/2021  ?  Priority: Low  ? Somatic dysfunction of spine, sacral 02/11/2021  ?  Priority: Low  ? Gastric polyps   ?  Priority: Low  ? Syncope and collapse   ?  Priority: Low  ? Syncope 01/09/2015  ?  Priority: Low  ? Family history of premature CAD 01/09/2015  ?  Priority: Low  ? Exertional shortness of breath 12/04/2013  ?  Priority: Low  ? Perimenopausal  vasomotor symptoms 08/10/2011  ?  Priority: Low  ? Post-nasal drainage 07/30/2011  ?  Priority: Low  ? Cough, persistent recurrent 07/30/2011  ?  Priority: Low  ? Postmenopausal HRT (hormone replacement therapy) 06/28/2010  ?  Priority: Low  ? PLANTAR FASCIITIS, RIGHT 01/02/2008  ?  Priority: Low  ? ?Past Surgical History:  ?Procedure Laterality Date  ? CESAREAN SECTION    ? x1  ? CHOLECYSTECTOMY    ? COLONOSCOPY  2011  ? tics, no polyps - recall 5 yr  ? ESOPHAGOGASTRODUODENOSCOPY (EGD) WITH PROPOFOL N/A 12/09/2015  ? Procedure: ESOPHAGOGASTRODUODENOSCOPY (EGD) WITH PROPOFOL;  Surgeon: Manus Gunning, MD;  Location: [facility];  Service: Gastroenterology;  Laterality: N/A;  ? EXPLORATORY LAPAROTOMY    ? Gallbladder  ? POLYPECTOMY N/A 12/09/2015  ? Procedure: POLYPECTOMY;  Surgeon: Manus Gunning, MD;  Location: [facility];  Service: Gastroenterology;  Laterality: N/A;  ? TRANSTHORACIC ECHOCARDIOGRAM  10/07/2020  ? EF 60 to 65%.  Mild concentric LVH.  GRII DD.  Mild to moderate AI.  Normal PAP.  Normal RAP.  Normal RAP.  Stable AI  ? TUBAL LIGATION    ? WISDOM TOOTH EXTRACTION    ? ? ?Family History  ?Problem  Relation Age of Onset  ? Heart disease Mother   ?     stent in 32 s  ? COPD Father   ? Heart disease Father   ?     cabg in 46s   ? Glaucoma Father   ? Dementia Father   ? Ulcerative colitis Sister   ? Ulcerative colitis Brother   ? Arrhythmia Daughter   ? Colon cancer Cousin   ? Colon polyps Neg Hx   ? Esophageal cancer Neg Hx   ? Rectal cancer Neg Hx   ? Stomach cancer Neg Hx   ? ? ?Medications- reviewed and updated ?Current Outpatient Medications  ?Medication Sig Dispense Refill  ? albuterol (VENTOLIN HFA) 108 (90 Base) MCG/ACT inhaler TAKE 2 PUFFS BY MOUTH EVERY 6 HOURS AS NEEDED FOR WHEEZE OR SHORTNESS OF BREATH 6.7 each 1  ? atorvastatin (LIPITOR) 20 MG tablet TAKE 1 TABLET BY MOUTH EVERY DAY 90 tablet 3  ? calcium carbonate (OS-CAL) 600 MG TABS tablet     ? celecoxib (CELEBREX) 200 MG  capsule Take 1 capsule (200 mg total) by mouth daily. 30 capsule 0  ? Cholecalciferol (VITAMIN D PO) Take 2 tablets by mouth daily.    ? ELDERBERRY PO daily.     ? esomeprazole (NEXIUM) 20 MG capsule Take 20 mg by mouth 2 (two) times daily.    ? famotidine (PEPCID) 20 MG tablet Take 20 mg by mouth as needed for heartburn or indigestion.    ? hydrochlorothiazide (MICROZIDE) 12.5 MG capsule TAKE 1 CAPSULE BY MOUTH EVERY DAY 90 capsule 3  ? KRILL OIL PO Take by mouth in the morning and at bedtime.    ? levothyroxine (SYNTHROID) 50 MCG tablet Take 1 tablet (50 mcg total) by mouth daily. 90 tablet 3  ? losartan (COZAAR) 25 MG tablet Take 1 tablet (25 mg total) by mouth daily. 90 tablet 0  ? Misc Natural Products (OSTEO BI-FLEX/5-LOXIN ADVANCED PO) Take by mouth 2 (two) times daily at 8 am and 10 pm.    ? Multiple Vitamin (MULTIVITAMIN WITH MINERALS) TABS tablet Take 1 tablet by mouth daily.     ? OVER THE COUNTER MEDICATION Take 1 capsule by mouth daily.    ? sucralfate (CARAFATE) 1 g tablet Take 1 tablet (1 g total) by mouth every 6 (six) hours as needed. 30 tablet 1  ? TART CHERRY PO Take 1 tablet by mouth daily.    ? Turmeric 500 MG TABS Take 1 tablet by mouth 2 (two) times daily.    ? vitamin A 3 MG (10000 UNITS) capsule Take 10,000 Units by mouth daily.    ? Ibuprofen-Famotidine (DUEXIS) 800-26.6 MG TABS Take 1 tablet by mouth daily as needed (do not take on day you take celebrex). 30 tablet 3  ? ?No current facility-administered medications for this visit.  ? ? ?Allergies-reviewed and updated ?Allergies  ?Allergen Reactions  ? Levaquin [Levofloxacin]   ?  See notes  September 2019  Palpitations irritability sleep issues    ? ? ?Social History  ? ?Social History Narrative  ? Married, daughter in [place]- no grandkids- has grandpups  ? - no pets but dog sits  ? - Originally from [place], moved over 20 years ago  ?   ? Retired from [employer] oct 2020- had [employer]  ?   ? Hobbies: traveling,  [place], dogsitting  ? ?Objective  ?Objective:  ?BP 126/70   Pulse 84   Temp  98 ?F (36.7 ?C)   Ht '5\' 3"'$  (1.6 m)   Wt 178 lb (80.7 kg)   LMP  (LMP Unknown)   SpO2 98%   BMI 31.53 kg/m?  ?Gen: NAD, resting comfortably ?CV: RRR no murmurs rubs or gallops ?Lungs: CTAB no crackles, wheeze, rhonchi ?Ext: no edema ?Skin: warm, dry ? ?  ?Assessment and Plan:  ? ?#hypertension and PVCs-sees Dr. Ellyn Hack ?S: medication: Hydrochlorothiazide 12.5 mg, losartan 25 mg ?-PVCs typically controlled without beta-blockers-reduced triggers such as caffeine and sweets and staying well-hydrated ?Home readings #s: usually 120s/70s ?BP Readings from Last 3 Encounters:  ?05/08/21 126/70  ?04/30/21 128/72  ?03/11/21 112/76  ?A/P: Controlled. Continue current medications.  ? ?#hyperlipidemia- family history CAD ?S: Medication: Atorvastatin 20 mg daily, Krill oil ?Lab Results  ?Component Value Date  ? CHOL 151 01/26/2021  ? HDL 51 01/26/2021  ? [place] 87 01/26/2021  ? LDLDIRECT 158.4 08/14/2012  ? TRIG 66 01/26/2021  ? CHOLHDL 3.0 01/26/2021  ?A/P: reasonable control- continue current meds ? ?#hypothyroidism ?S: compliant On thyroid medication-levothyroxine 50 mcg ?-Reassuring thyroid ultrasound 03/03/21-3 mm nodule with no follow-up recommended ?Lab Results  ?Component Value Date  ? TSH 2.50 02/25/2021  ?A/P:Controlled. Continue current medications.  ?  ? # Hyperglycemia/insulin resistance/prediabetes-fasting blood sugars elevated in the past but A1c's have been okay ?S:  Medication: none ?-most recent CBG actually <100 ?A/P: consider repeat a1c in the future ?  ?# GERD ?S:Medication: Nexium 20 mg twice daily  (highly variable)as needed along with Pepcid as needed (5 days a week) ?A/P: reasonable control if modifies diet- has seen GI locally (has had endoscopy in [place])- ? [facility] but do not have records-  I think sparing use is ok- we will monitor- consider b12 check in future if more consistent with nexium use ?  ?#Fatty liver-LFTs ok-  incidental finding 2015. She is already trying to work on weight loss- but challenging . Considering starting program back at [employer] including strength training- is doing one class a week plus pool exercises- h

## 2021-05-08 NOTE — Patient Instructions (Addendum)
Glad you are doing well- let us know if you need anything ? ?Recommended follow up: Return in about 6 months (around 11/08/2021) for followup or sooner if needed.Schedule b4 you leave. ?

## 2021-05-08 NOTE — Telephone Encounter (Signed)
See below

## 2021-05-12 NOTE — Telephone Encounter (Signed)
Pt returning call.  ?States she will be available until 2:00pm.  ?

## 2021-05-12 NOTE — Telephone Encounter (Signed)
Called and lm for pt tcb. 

## 2021-05-12 NOTE — Telephone Encounter (Signed)
Called and spoke with pt and made aware.  

## 2021-06-10 NOTE — Progress Notes (Signed)
Jillian Escobar [place] [address] [place] Phone: [phone] Subjective:   IVilma Meckel, am serving as a scribe for Dr. Hulan Saas.  I'm seeing this patient by the request  of:  Marin Olp, MD  CC: Back and neck pain follow-up  [id number]  RANYIA WITTING is a 71 y.o. female coming in with complaint of back and neck pain. OMT 04/30/2021. Patient states on a trip low back or hip has been acting up. N with patient traveling and has not been able to do the exercises as regularly.  Has had more pain on the right lower side of the back.  Medications patient has been prescribed: Celebrex  Taking:         Reviewed prior external information including notes and imaging from previsou exam, outside providers and external EMR if available.   As well as notes that were available from care everywhere and other healthcare systems.  Past medical history, social, surgical and family history all reviewed in electronic medical record.  No pertanent information unless stated regarding to the chief complaint.   Past Medical History:  Diagnosis Date   Allergy    Arthritis    GERD (gastroesophageal reflux disease)    Hx of varicella    Hyperlipidemia    borderline   Hypertension    Hypothyroidism    Pneumonia 11/2014   Only time took inhalers to treat   RBBB 10/2019   [facility] seen [facility] - f/u 6 mons, no treatment   Shingles 10/26/2010   Syncope and collapse 10/2019   Unclear etiology - just saw [facility] - f/u 6 months - no treatment   Thyroid disease     Allergies  Allergen Reactions   Levaquin [Levofloxacin]     See notes  September 2019  Palpitations irritability sleep issues       Review of Systems:  No headache, visual changes, nausea, vomiting, diarrhea, constipation, dizziness, abdominal pain, skin rash, fevers, chills, night sweats, weight loss, swollen lymph nodes, body aches, joint swelling, chest pain, shortness of  breath, mood changes. POSITIVE muscle aches  Objective  Blood pressure 130/76, pulse 80, height '5\' 3"'$  (1.6 m), weight 175 lb (79.4 kg), SpO2 98 %.   General: No apparent distress alert and oriented x3 mood and affect normal, dressed appropriately.  HEENT: Pupils equal, extraocular movements intact  Respiratory: Patient's speak in full sentences and does not appear short of breath  Cardiovascular: No lower extremity edema, non tender, no erythema  Patient is low back does have tenderness to palpation of the sacroiliac joint.  Seems to be more on the right side than left.  Does have some significant discomfort noted in this area.  Mild positive FABER test.  Osteopathic findings  C2 flexed rotated and side bent right C3 flexed rotated and side bent left T4 extended rotated and side bent right inhaled rib T6 extended rotated and side bent left L2 flexed rotated and side bent right L3 flexed rotated and side bent left Sacrum right on right       Assessment and Plan:  Degenerative disc disease, lumbar Chronic problem with mild worsening.  Seems to be more over the right sacroiliac joint as well.  We will continue to monitor.  Has done relatively well though with anti-inflammatories previously.  We discussed with patient about either the Celebrex or the Duexis which patient has had previously.  Discussed which activities to do and which ones to avoid.  Increase activity slowly otherwise.  Follow-up with me again in 6 to 8 weeks    Nonallopathic problems  Decision today to treat with OMT was based on Physical Exam  After verbal consent patient was treated with HVLA, ME, FPR techniques in cervical, rib, thoracic, lumbar, and sacral  areas  Patient tolerated the procedure well with improvement in symptoms  Patient given exercises, stretches and lifestyle modifications  See medications in patient instructions if given  Patient will follow up in 4-8 weeks     The above  documentation has been reviewed and is accurate and complete [name], DO        Note: This dictation was prepared with Dragon dictation along with smaller phrase technology. Any transcriptional errors that result from this process are unintentional.

## 2021-06-11 ENCOUNTER — Encounter: Payer: Self-pay | Admitting: Family Medicine

## 2021-06-11 ENCOUNTER — Ambulatory Visit (INDEPENDENT_AMBULATORY_CARE_PROVIDER_SITE_OTHER): Payer: Medicare Other | Admitting: Family Medicine

## 2021-06-11 VITALS — BP 130/76 | HR 80 | Ht 63.0 in | Wt 175.0 lb

## 2021-06-11 DIAGNOSIS — M9904 Segmental and somatic dysfunction of sacral region: Secondary | ICD-10-CM

## 2021-06-11 DIAGNOSIS — M9902 Segmental and somatic dysfunction of thoracic region: Secondary | ICD-10-CM

## 2021-06-11 DIAGNOSIS — M9903 Segmental and somatic dysfunction of lumbar region: Secondary | ICD-10-CM | POA: Diagnosis not present

## 2021-06-11 DIAGNOSIS — M9901 Segmental and somatic dysfunction of cervical region: Secondary | ICD-10-CM | POA: Diagnosis not present

## 2021-06-11 DIAGNOSIS — M5136 Other intervertebral disc degeneration, lumbar region: Secondary | ICD-10-CM

## 2021-06-11 DIAGNOSIS — M9908 Segmental and somatic dysfunction of rib cage: Secondary | ICD-10-CM

## 2021-06-11 NOTE — Assessment & Plan Note (Signed)
Chronic problem with mild worsening.  Seems to be more over the right sacroiliac joint as well.  We will continue to monitor.  Has done relatively well though with anti-inflammatories previously.  We discussed with patient about either the Celebrex or the Duexis which patient has had previously.  Discussed which activities to do and which ones to avoid.  Increase activity slowly otherwise.  Follow-up with me again in 6 to 8 weeks

## 2021-06-11 NOTE — Patient Instructions (Signed)
Good to see you!  Enjoy the travels See you again in 6-8 weeks

## 2021-07-01 ENCOUNTER — Other Ambulatory Visit: Payer: Self-pay | Admitting: Cardiology

## 2021-07-01 ENCOUNTER — Other Ambulatory Visit: Payer: Self-pay | Admitting: Internal Medicine

## 2021-07-14 ENCOUNTER — Other Ambulatory Visit: Payer: Self-pay

## 2021-07-14 DIAGNOSIS — Z79899 Other long term (current) drug therapy: Secondary | ICD-10-CM

## 2021-07-14 DIAGNOSIS — E785 Hyperlipidemia, unspecified: Secondary | ICD-10-CM

## 2021-07-15 LAB — LIPID PANEL
Chol/HDL Ratio: 2.8 ratio (ref 0.0–4.4)
Cholesterol, Total: 122 mg/dL (ref 100–199)
HDL: 43 mg/dL (ref >39)
LDL Chol Calc (NIH): 63 mg/dL (ref 0–99)
Triglycerides: 82 mg/dL (ref 0–149)
VLDL Cholesterol Cal: 16 mg/dL (ref 5–40)

## 2021-07-22 NOTE — Progress Notes (Signed)
[place] Phone: [phone] Subjective:   Jillian Escobar, am serving as a scribe for Dr. Hulan Saas.  I'm seeing this patient by the request  of:  Marin Olp, MD  CC: back and neck pain   [id number]  Jillian Escobar is a 71 y.o. female coming in with complaint of back and neck pain. OMT 06/11/2021. Patient states that her back is doing well.  New problem: R foot and ankle pain. Has been walking more as she was dog sitting last week and she does walk for exercise as well. Pain in medial arch at night and pain in foot and ankle when she is walking.   Medications patient has been prescribed: Celebrex  Taking:         Reviewed prior external information including notes and imaging from previsou exam, outside providers and external EMR if available.   As well as notes that were available from care everywhere and other healthcare systems.  Past medical history, social, surgical and family history all reviewed in electronic medical record.  Escobar pertanent information unless stated regarding to the chief complaint.   Past Medical History:  Diagnosis Date   Allergy    Arthritis    GERD (gastroesophageal reflux disease)    Hx of varicella    Hyperlipidemia    borderline   Hypertension    Hypothyroidism    Pneumonia 11/2014   Only time took inhalers to treat   RBBB 10/2019   [facility] seen at [facility] - f/u 6 mons, Escobar treatment   Shingles 10/26/2010   Syncope and collapse 10/2019   Unclear etiology - just saw [facility] - f/u 6 months - Escobar treatment   Thyroid disease     Allergies  Allergen Reactions   Levaquin [Levofloxacin]     See notes  September 2019  Palpitations irritability sleep issues       Review of Systems:  Escobar headache, visual changes, nausea, vomiting, diarrhea, constipation, dizziness, abdominal pain, skin rash, fevers, chills, night sweats, weight loss, swollen lymph nodes, body  aches, joint swelling, chest pain, shortness of breath, mood changes. POSITIVE muscle aches  Objective  Blood pressure 114/82, pulse 70, height '5\' 3"'$  (1.6 m), weight 183 lb (83 kg), SpO2 92 %.   General: Escobar apparent distress alert and oriented x3 mood and affect normal, dressed appropriately.  HEENT: Pupils equal, extraocular movements intact  Respiratory: Patient's speak in full sentences and does not appear short of breath  Cardiovascular: Escobar lower extremity edema, non tender, Escobar erythema  Gait MSK:  Back low back exam still has significant tightness noted around the sacroiliac joint in the lower back.  Patient does have some increasing in loss of lordosis.  Tightness with FABER test bilaterally. Right foot exam shows the patient does have some rigid midfoot noted.  High arch noted.  Patient does have tenderness to palpation over the proximal first and second metatarsals.  Osteopathic findings  C3 flexed rotated and side bent right C5 flexed rotated and side bent left T5 extended rotated and side bent right inhaled rib T6 extended rotated and side bent left L2 flexed rotated and side bent right Sacrum right on right    Assessment and Plan:  Degenerative disc disease, lumbar Degenerative disc disease of the lumbar spine.  Patient in chronic problem with exacerbation.  Seems to be secondary to patient going to be traveling recently.  Also likely some exacerbation secondary  to patient's foot pain.  Likely does have a rigid midfoot with some arthritic changes that is contributing.  Encourage patient to continue to work on core stability and exercises.  Follow-up again in 6 to 8 weeks    Nonallopathic problems  Decision today to treat with OMT was based on Physical Exam  After verbal consent patient was treated with HVLA, ME, FPR techniques in cervical, rib, thoracic, lumbar, and sacral  areas  Patient tolerated the procedure well with improvement in symptoms  Patient given  exercises, stretches and lifestyle modifications  See medications in patient instructions if given  Patient will follow up in 4-8 weeks    The above documentation has been reviewed and is accurate and complete [name], DO          Note: This dictation was prepared with Dragon dictation along with smaller phrase technology. Any transcriptional errors that result from this process are unintentional.

## 2021-07-23 ENCOUNTER — Ambulatory Visit (INDEPENDENT_AMBULATORY_CARE_PROVIDER_SITE_OTHER): Payer: Medicare Other

## 2021-07-23 ENCOUNTER — Ambulatory Visit (INDEPENDENT_AMBULATORY_CARE_PROVIDER_SITE_OTHER): Payer: Medicare Other | Admitting: Family Medicine

## 2021-07-23 VITALS — BP 114/82 | HR 70 | Ht 63.0 in | Wt 183.0 lb

## 2021-07-23 DIAGNOSIS — M9908 Segmental and somatic dysfunction of rib cage: Secondary | ICD-10-CM

## 2021-07-23 DIAGNOSIS — M9902 Segmental and somatic dysfunction of thoracic region: Secondary | ICD-10-CM

## 2021-07-23 DIAGNOSIS — M9901 Segmental and somatic dysfunction of cervical region: Secondary | ICD-10-CM | POA: Diagnosis not present

## 2021-07-23 DIAGNOSIS — M9903 Segmental and somatic dysfunction of lumbar region: Secondary | ICD-10-CM

## 2021-07-23 DIAGNOSIS — M25511 Pain in right shoulder: Secondary | ICD-10-CM | POA: Diagnosis not present

## 2021-07-23 DIAGNOSIS — M19079 Primary osteoarthritis, unspecified ankle and foot: Secondary | ICD-10-CM | POA: Diagnosis not present

## 2021-07-23 DIAGNOSIS — M5136 Other intervertebral disc degeneration, lumbar region: Secondary | ICD-10-CM | POA: Diagnosis not present

## 2021-07-23 DIAGNOSIS — M9904 Segmental and somatic dysfunction of sacral region: Secondary | ICD-10-CM

## 2021-07-23 NOTE — Assessment & Plan Note (Signed)
Do believe the patient does have arthritic changes of the midfoot.  Discussed with patient about icing regimen and home exercises.  Discussed with patient about the more rigid soled shoes and how they could be more beneficial.  Discussed over-the-counter orthotic recovery sandals.  F/u 6-8 weeks

## 2021-07-23 NOTE — Assessment & Plan Note (Signed)
Degenerative disc disease of the lumbar spine.  Patient in chronic problem with exacerbation.  Seems to be secondary to patient going to be traveling recently.  Also likely some exacerbation secondary to patient's foot pain.  Likely does have a rigid midfoot with some arthritic changes that is contributing.  Encourage patient to continue to work on core stability and exercises.  Follow-up again in 6 to 8 weeks

## 2021-07-23 NOTE — Patient Instructions (Signed)
HOKA Bondhi Recovery sandals in the house Xray R foot  See me again in 4-5 weeks

## 2021-07-27 ENCOUNTER — Other Ambulatory Visit (HOSPITAL_BASED_OUTPATIENT_CLINIC_OR_DEPARTMENT_OTHER): Payer: Self-pay

## 2021-07-27 MED ORDER — ZOSTER VAC RECOMB ADJUVANTED 50 MCG/0.5ML IM SUSR
INTRAMUSCULAR | 0 refills | Status: DC
Start: 2021-07-27 — End: 2021-11-09
  Filled 2021-07-27: qty 0.5, 1d supply, fill #0

## 2021-07-30 ENCOUNTER — Other Ambulatory Visit (HOSPITAL_BASED_OUTPATIENT_CLINIC_OR_DEPARTMENT_OTHER): Payer: Self-pay

## 2021-08-11 ENCOUNTER — Other Ambulatory Visit: Payer: Self-pay | Admitting: Cardiology

## 2021-08-27 NOTE — Progress Notes (Signed)
[place] Phone: [phone] Subjective:   Jillian Escobar, am serving as a scribe for Dr. Hulan Saas.  I'm seeing this patient by the request  of:  Marin Olp, MD  CC: Back and neck pain follow-up   [id number]  Jillian Escobar is a 71 y.o. female coming in with complaint of back and neck pain. OMT 07/23/2021. Also f/u for R foot pain. Patient states that her back pain is the same as last visit.   R foot is slowly improving.   Medications patient has been prescribed: Celebrex  Taking: Yes         Reviewed prior external information including notes and imaging from previsou exam, outside providers and external EMR if available.   As well as notes that were available from care everywhere and other healthcare systems.  Past medical history, social, surgical and family history all reviewed in electronic medical record.  Escobar pertanent information unless stated regarding to the chief complaint.   Past Medical History:  Diagnosis Date   Allergy    Arthritis    GERD (gastroesophageal reflux disease)    Hx of varicella    Hyperlipidemia    borderline   Hypertension    Hypothyroidism    Pneumonia 11/2014   Only time took inhalers to treat   RBBB 10/2019   [facility] seen [facility] - f/u 6 mons, Escobar treatment   Shingles 10/26/2010   Syncope and collapse 10/2019   Unclear etiology - just saw [facility] - f/u 6 months - Escobar treatment   Thyroid disease     Allergies  Allergen Reactions   Levaquin [Levofloxacin]     See notes  September 2019  Palpitations irritability sleep issues       Review of Systems:  Escobar headache, visual changes, nausea, vomiting, diarrhea, constipation, dizziness, abdominal pain, skin rash, fevers, chills, night sweats, weight loss, swollen lymph nodes, body aches, joint swelling, chest pain, shortness of breath, mood changes. POSITIVE muscle aches  Objective  Blood pressure  116/72, pulse 70, height '5\' 3"'$  (1.6 m), weight 172 lb (78 kg), SpO2 98 %.   General: Escobar apparent distress alert and oriented x3 mood and affect normal, dressed appropriately.  HEENT: Pupils equal, extraocular movements intact  Respiratory: Patient's speak in full sentences and does not appear short of breath  Cardiovascular: Escobar lower extremity edema, non tender, Escobar erythema  Gait MSK:  Back does have some loss of lordosis and some tenderness to palpation over the area.  Patient does have mild increase in kyphosis of the upper thoracic spine.  Thoracic lordosis in the lumbar area.  Tightness of the FABER test.  Osteopathic findings  C4 flexed rotated and side bent right C6 flexed rotated and side bent right T3 extended rotated and side bent right inhaled rib T8 extended rotated and side bent left L2 flexed rotated and side bent right Sacrum right on right       Assessment and Plan:  Degenerative disc disease, lumbar Known degenerative disc disease.  Responding well to osteopathic manipulation.  We will change intervals to 10 months.  Continue core strengthening.  Follow-up again in 3 months.    Nonallopathic problems  Decision today to treat with OMT was based on Physical Exam  After verbal consent patient was treated with HVLA, ME, FPR techniques in cervical, rib, thoracic, lumbar, and sacral  areas  Patient tolerated the procedure well with improvement in  symptoms Patient given exercises, stretches and lifestyle modifications  See medications in patient instructions if given  Patient will follow up in 4-8 weeks    The above documentation has been reviewed and is accurate and complete [name], DO          Note: This dictation was prepared with Dragon dictation along with smaller phrase technology. Any transcriptional errors that result from this process are unintentional.

## 2021-08-28 ENCOUNTER — Ambulatory Visit (INDEPENDENT_AMBULATORY_CARE_PROVIDER_SITE_OTHER): Payer: Medicare Other | Admitting: Family Medicine

## 2021-08-28 ENCOUNTER — Ambulatory Visit: Payer: Self-pay

## 2021-08-28 VITALS — BP 116/72 | HR 70 | Ht 63.0 in | Wt 172.0 lb

## 2021-08-28 DIAGNOSIS — M9908 Segmental and somatic dysfunction of rib cage: Secondary | ICD-10-CM

## 2021-08-28 DIAGNOSIS — M9902 Segmental and somatic dysfunction of thoracic region: Secondary | ICD-10-CM

## 2021-08-28 DIAGNOSIS — M5136 Other intervertebral disc degeneration, lumbar region: Secondary | ICD-10-CM

## 2021-08-28 DIAGNOSIS — M9904 Segmental and somatic dysfunction of sacral region: Secondary | ICD-10-CM | POA: Diagnosis not present

## 2021-08-28 DIAGNOSIS — M9903 Segmental and somatic dysfunction of lumbar region: Secondary | ICD-10-CM

## 2021-08-28 DIAGNOSIS — M79671 Pain in right foot: Secondary | ICD-10-CM

## 2021-08-28 DIAGNOSIS — M9901 Segmental and somatic dysfunction of cervical region: Secondary | ICD-10-CM | POA: Diagnosis not present

## 2021-08-28 NOTE — Assessment & Plan Note (Signed)
Known degenerative disc disease.  Responding well to osteopathic manipulation.  We will change intervals to 10 months.  Continue core strengthening.  Follow-up again in 3 months.

## 2021-08-28 NOTE — Patient Instructions (Signed)
Good to see you Good movement today Wear good shoes Enjoy grandpuppies See me in 6-8 weeks

## 2021-09-28 ENCOUNTER — Other Ambulatory Visit: Payer: Self-pay | Admitting: Internal Medicine

## 2021-09-28 ENCOUNTER — Encounter: Payer: Self-pay | Admitting: *Deleted

## 2021-10-06 NOTE — Progress Notes (Unsigned)
[place] Phone: [phone] Subjective:   [name], am serving as a scribe for Dr. Hulan Saas.  I'm seeing this patient by the request  of:  Marin Olp, MD  CC: back and neck pain   [id number]  Jillian Escobar is a 71 y.o. female coming in with complaint of back and neck pain. OMT 08/28/2021. Patient states that she did some yardwork and developed L hip pain in SI joint. Had massage and suddenly yesterday she had pain relief. Took Duexis the other day.  R foot is more painful due to weather changes.   Medications patient has been prescribed: None  Taking:         Reviewed prior external information including notes and imaging from previsou exam, outside providers and external EMR if available.   As well as notes that were available from care everywhere and other healthcare systems.  Past medical history, social, surgical and family history all reviewed in electronic medical record.  No pertanent information unless stated regarding to the chief complaint.   Past Medical History:  Diagnosis Date   Allergy    Arthritis    GERD (gastroesophageal reflux disease)    Hx of varicella    Hyperlipidemia    borderline   Hypertension    Hypothyroidism    Pneumonia 11/2014   Only time took inhalers to treat   RBBB 10/2019   [facility]ly seen at [facility] - f/u 6 mons, no treatment   Shingles 10/26/2010   Syncope and collapse 10/2019   Unclear etiology - just saw [facility] - f/u 6 months - no treatment   Thyroid disease     Allergies  Allergen Reactions   Levaquin [Levofloxacin]     See notes  September 2019  Palpitations irritability sleep issues       Review of Systems:  No headache, visual changes, nausea, vomiting, diarrhea, constipation, dizziness, abdominal pain, skin rash, fevers, chills, night sweats, weight loss, swollen lymph nodes, body aches, joint swelling, chest pain, shortness of  breath, mood changes. POSITIVE muscle aches  Objective  Blood pressure 110/72, pulse 74, height '5\' 3"'$  (1.6 m), weight 175 lb (79.4 kg), SpO2 98 %.   General: No apparent distress alert and oriented x3 mood and affect normal, dressed appropriately.  HEENT: Pupils equal, extraocular movements intact  Respiratory: Patient's speak in full sentences and does not appear short of breath  Cardiovascular: No lower extremity edema, non tender, no erythema  Mild increase in kyphosis noted. Back pain that is tender to palpation in the paraspinal musculature.  Significant tightness in the thoracolumbar juncture of the hip flexors more than usual.  Osteopathic findings  C3 flexed rotated and side bent right C6 flexed rotated and side bent left T3 extended rotated and side bent right inhaled rib T8 extended rotated and side bent left L1 flexed rotated and side bent left L2 flexed rotated and side bent right Sacrum right on right    Assessment and Plan:  Degenerative disc disease, lumbar Degenerative disc disease.  Patient has been traveling and did relatively well.  Discussed with patient again about icing regimen, home exercises, patient is going to start doing the routine with the exercises again that have been helpful.  We discussed some different stretches after doing yard work and that can hopefully help avoid this aggravation and will follow-up with me again in 6 to 8 weeks    Nonallopathic problems  Decision today to treat with OMT was based on Physical Exam  After verbal consent patient was treated with HVLA, ME, FPR techniques in cervical, rib, thoracic, lumbar, and sacral  areas  Patient tolerated the procedure well with improvement in symptoms  Patient given exercises, stretches and lifestyle modifications  See medications in patient instructions if given  Patient will follow up in 4-8 weeks     The above documentation has been reviewed and is accurate and complete [name], DO         Note: This dictation was prepared with Dragon dictation along with smaller phrase technology. Any transcriptional errors that result from this process are unintentional.

## 2021-10-07 ENCOUNTER — Other Ambulatory Visit (HOSPITAL_BASED_OUTPATIENT_CLINIC_OR_DEPARTMENT_OTHER): Payer: Self-pay

## 2021-10-07 ENCOUNTER — Ambulatory Visit (INDEPENDENT_AMBULATORY_CARE_PROVIDER_SITE_OTHER): Payer: Medicare Other | Admitting: Family Medicine

## 2021-10-07 VITALS — BP 110/72 | HR 74 | Ht 63.0 in | Wt 175.0 lb

## 2021-10-07 DIAGNOSIS — M5136 Other intervertebral disc degeneration, lumbar region: Secondary | ICD-10-CM | POA: Diagnosis not present

## 2021-10-07 DIAGNOSIS — M9908 Segmental and somatic dysfunction of rib cage: Secondary | ICD-10-CM | POA: Diagnosis not present

## 2021-10-07 DIAGNOSIS — M9903 Segmental and somatic dysfunction of lumbar region: Secondary | ICD-10-CM | POA: Diagnosis not present

## 2021-10-07 DIAGNOSIS — M9901 Segmental and somatic dysfunction of cervical region: Secondary | ICD-10-CM | POA: Diagnosis not present

## 2021-10-07 DIAGNOSIS — M9902 Segmental and somatic dysfunction of thoracic region: Secondary | ICD-10-CM | POA: Diagnosis not present

## 2021-10-07 DIAGNOSIS — M9904 Segmental and somatic dysfunction of sacral region: Secondary | ICD-10-CM

## 2021-10-07 MED ORDER — INFLUENZA VAC A&B SA ADJ QUAD 0.5 ML IM PRSY
PREFILLED_SYRINGE | INTRAMUSCULAR | 0 refills | Status: DC
  Filled 2021-10-07: qty 0.5, 1d supply, fill #0

## 2021-10-07 NOTE — Assessment & Plan Note (Signed)
Degenerative disc disease.  Patient has been traveling and did relatively well.  Discussed with patient again about icing regimen, home exercises, patient is going to start doing the routine with the exercises again that have been helpful.  We discussed some different stretches after doing yard work and that can hopefully help avoid this aggravation and will follow-up with me again in 6 to 8 weeks

## 2021-10-07 NOTE — Patient Instructions (Signed)
Great to see you  Careful with the mulch  Get back in the routine  See em again in 6 weeks

## 2021-10-14 ENCOUNTER — Other Ambulatory Visit (HOSPITAL_BASED_OUTPATIENT_CLINIC_OR_DEPARTMENT_OTHER): Payer: Self-pay

## 2021-10-14 MED ORDER — COVID-19 MRNA 2023-2024 VACCINE (COMIRNATY) 0.3 ML INJECTION
INTRAMUSCULAR | 0 refills | Status: DC
  Filled 2021-10-14: qty 0.3, 1d supply, fill #0

## 2021-11-09 ENCOUNTER — Ambulatory Visit (INDEPENDENT_AMBULATORY_CARE_PROVIDER_SITE_OTHER): Payer: Medicare Other | Admitting: Family Medicine

## 2021-11-09 ENCOUNTER — Encounter: Payer: Self-pay | Admitting: Family Medicine

## 2021-11-09 VITALS — BP 110/62 | HR 66 | Temp 97.8°F | Ht 63.0 in | Wt 172.0 lb

## 2021-11-09 DIAGNOSIS — E039 Hypothyroidism, unspecified: Secondary | ICD-10-CM

## 2021-11-09 DIAGNOSIS — E785 Hyperlipidemia, unspecified: Secondary | ICD-10-CM | POA: Diagnosis not present

## 2021-11-09 DIAGNOSIS — K760 Fatty (change of) liver, not elsewhere classified: Secondary | ICD-10-CM

## 2021-11-09 DIAGNOSIS — I10 Essential (primary) hypertension: Secondary | ICD-10-CM | POA: Diagnosis not present

## 2021-11-09 DIAGNOSIS — Z1159 Encounter for screening for other viral diseases: Secondary | ICD-10-CM

## 2021-11-09 LAB — TSH: TSH: 1.57 u[IU]/mL (ref 0.35–5.50)

## 2021-11-09 LAB — COMPREHENSIVE METABOLIC PANEL
ALT: 15 U/L (ref 0–35)
AST: 20 U/L (ref 0–37)
Albumin: 4.2 g/dL (ref 3.5–5.2)
Alkaline Phosphatase: 63 U/L (ref 39–117)
BUN: 17 mg/dL (ref 6–23)
CO2: 28 mEq/L (ref 19–32)
Calcium: 10 mg/dL (ref 8.4–10.5)
Chloride: 104 mEq/L (ref 96–112)
Creatinine, Ser: 0.75 mg/dL (ref 0.40–1.20)
GFR: 81.4 mL/min (ref >60.00)
Glucose, Bld: 87 mg/dL (ref 70–99)
Potassium: 3.7 mEq/L (ref 3.5–5.1)
Sodium: 139 mEq/L (ref 135–145)
Total Bilirubin: 0.8 mg/dL (ref 0.2–1.2)
Total Protein: 6.9 g/dL (ref 6.0–8.3)

## 2021-11-09 NOTE — Progress Notes (Signed)
Phone [phone] In person visit   Subjective:   Jillian Escobar is a 71 y.o. year old very pleasant female patient who presents for/with See problem oriented charting Chief Complaint  Patient presents with   Follow-up   Past Medical History-  Patient Active Problem List   Diagnosis Date Noted   Hyperlipidemia, unspecified 05/08/2021    Priority: Medium    Aortic valve insufficiency 05/01/2020    Priority: Medium    Symptomatic PVCs 05/14/2019    Priority: Medium    Essential hypertension 01/11/2015    Priority: Medium    Fasting hyperglycemia 03/04/2014    Priority: Medium    Hepatic steatosis mild 11/23/2013    Priority: Medium    BPPV (benign paroxysmal positional vertigo) 11/10/2012    Priority: Medium    Family history of Alzheimer's disease 08/10/2011    Priority: Medium    Hypothyroidism 01/22/2008    Priority: Medium    GERD 01/22/2008    Priority: Medium    Pes anserine bursitis 03/11/2021    Priority: Low   Degenerative disc disease, lumbar 02/11/2021    Priority: Low   Somatic dysfunction of spine, sacral 02/11/2021    Priority: Low   Gastric polyps     Priority: Low   Syncope and collapse     Priority: Low   Syncope 01/09/2015    Priority: Low   Family history of premature CAD 01/09/2015    Priority: Low   Exertional shortness of breath 12/04/2013    Priority: Low   Perimenopausal vasomotor symptoms 08/10/2011    Priority: Low   Post-nasal drainage 07/30/2011    Priority: Low   Cough, persistent recurrent 07/30/2011    Priority: Low   Postmenopausal HRT (hormone replacement therapy) 06/28/2010    Priority: Low   PLANTAR FASCIITIS, RIGHT 01/02/2008    Priority: Low   Arthritis of midfoot 07/23/2021    Medications- reviewed and updated Current Outpatient Medications  Medication Sig Dispense Refill   albuterol (VENTOLIN HFA) 108 (90 Base) MCG/ACT inhaler TAKE 2 PUFFS BY MOUTH EVERY 6 HOURS AS NEEDED FOR WHEEZE OR SHORTNESS OF BREATH 6.7  each 1   atorvastatin (LIPITOR) 20 MG tablet TAKE 1 TABLET BY MOUTH EVERY DAY 90 tablet 3   calcium carbonate (OS-CAL) 600 MG TABS tablet      celecoxib (CELEBREX) 200 MG capsule Take 1 capsule (200 mg total) by mouth daily. 30 capsule 0   Cholecalciferol (VITAMIN D PO) Take 2 tablets by mouth daily.     ELDERBERRY PO daily.      esomeprazole (NEXIUM) 20 MG capsule Take 20 mg by mouth 2 (two) times daily.     famotidine (PEPCID) 20 MG tablet Take 20 mg by mouth as needed for heartburn or indigestion.     hydrochlorothiazide (MICROZIDE) 12.5 MG capsule TAKE 1 CAPSULE BY MOUTH EVERY DAY 90 capsule 3   Ibuprofen-Famotidine (DUEXIS) 800-26.6 MG TABS Take 1 tablet by mouth daily as needed (do not take on day you take celebrex). 30 tablet 3   influenza vaccine adjuvanted (FLUAD) 0.5 ML injection Inject into the muscle. 0.5 mL 0   KRILL OIL PO Take by mouth in the morning and at bedtime.     levothyroxine (SYNTHROID) 50 MCG tablet Take 1 tablet (50 mcg total) by mouth daily. 90 tablet 3   losartan (COZAAR) 25 MG tablet TAKE 1 TABLET (25 MG TOTAL) BY MOUTH DAILY. 90 tablet 0   Misc Natural Products (OSTEO BI-FLEX/5-LOXIN ADVANCED PO) Take by  mouth 2 (two) times daily at 8 am and 10 pm.     Multiple Vitamin (MULTIVITAMIN WITH MINERALS) TABS tablet Take 1 tablet by mouth daily.      OVER THE COUNTER MEDICATION Take 1 capsule by mouth daily.     sucralfate (CARAFATE) 1 g tablet Take 1 tablet (1 g total) by mouth every 6 (six) hours as needed. 30 tablet 1   TART CHERRY PO Take 1 tablet by mouth daily.     Turmeric 500 MG TABS Take 1 tablet by mouth 2 (two) times daily.     vitamin A 3 MG (10000 UNITS) capsule Take 10,000 Units by mouth daily.     No current facility-administered medications for this visit.     Objective:  BP 110/62   Pulse 66   Temp 97.8 F (36.6 C)   Ht '5\' 3"'$  (1.6 m)   Wt 172 lb (78 kg)   LMP  (LMP Unknown)   SpO2 98%   BMI 30.47 kg/m  Gen: NAD, resting comfortably CV: RRR  no murmurs rubs or gallops Lungs: CTAB no crackles, wheeze, rhonchi Ext: no edema, mild dryness at base of MCP of 3rd finger Skin: warm, dry     Assessment and Plan   #hypertension and PVCs-sees Dr. Ellyn Hack S: medication: Hydrochlorothiazide 12.5 mg, losartan 25 mg -PVCs typically controlled without beta-blockers-reduced triggers such as caffeine and sweets and staying well-hydrated- still getting some but not bad enough for meds Home readings #s: 120s at home  BP Readings from Last 3 Encounters:  11/09/21 110/62  10/07/21 110/72  08/28/21 116/72   A/P: Hypertension and PVCs- Controlled for most part (pvcs mild poor control but wants to hold off on meds) but occasionally feeling lightheaded in AM and Bps running lower on our readings. Continue current medications- but reduce losartan to half tablet or 12.5 mg and update me in 1 week with home blood pressures. <130/80 ideally.    #hyperlipidemia- family history CAD S: Medication: Atorvastatin 20 mg daily, Krill oil Lab Results  Component Value Date   CHOL 122 07/14/2021   HDL 43 07/14/2021   LDLCALC 63 07/14/2021   LDLDIRECT 158.4 08/14/2012   TRIG 82 07/14/2021   CHOLHDL 2.8 07/14/2021   A/P: Excellent control-continue current medication  #hypothyroidism S: compliant On thyroid medication-levothyroxine 50 mcg -Reassuring thyroid ultrasound 03/03/21-3 mm nodule with no follow-up recommended Lab Results  Component Value Date   TSH 2.50 02/25/2021  A/P:TSH has been well controlled-continue current medication-offered update today    # Hyperglycemia/insulin resistance/prediabetes-fasting blood sugars elevated in the past but A1c's have been okay S:  Medication: none Lab Results  Component Value Date   HGBA1C 5.4 07/26/2018   HGBA1C 5.3 02/07/2018  A/P: cbg normal fasting last visit- hold off on a1c    # GERD S:Medication: Nexium 20 mg twice daily along with Pepcid as needed -recently only needing pepcid once to twice  daily A/P: Controlled. Continue current medications.     #Fatty liver-congratulated on 6 pounds weight loss from last visit! Calorie counting has been helpful on myfitnesspal. Does not feel overly restricted. Low desire for alcohol likely also helping - sagewell 3-4 days a week- was progressing on strength and balance. Arthritis mildly better.  Wt Readings from Last 3 Encounters:  11/09/21 172 lb (78 kg)  10/07/21 175 lb (79.4 kg)  08/28/21 172 lb (78 kg)    #Musculoskeletal concerns-has been seen by Dr. Tamala Julian of [facility] in the past-knee pain, degenerative  disc disease, hand arthritis- has had several visits -Typically taking rare duexis.  Has not needed celebrex  #Rash on 3rd finger right hand at MCP joint- has tried triamcinolone cream from dermatology but just spot treatments. Started out as tiny blisters but then basically dried up/scaled over and spread slighlty -recommended trialing this consistently twice a day for 10 days and see if this calms it down- if worsens certainly let us know  Recommended follow up: Return in about 6 months (around 05/10/2022) for followup or sooner if needed.Schedule b4 you leave. Future Appointments  Date Time Provider [place]  11/17/2021 10:45 AM [name], DO [facility] None  01/28/2022 10:00 AM Leonie Man, MD [facility] None   Lab/Order associations:   ICD-10-CM   1. Hypothyroidism, unspecified type  E03.9     2. Essential hypertension  I10     3. Hyperlipidemia, unspecified hyperlipidemia type  E78.5     4. Hepatic steatosis mild  K76.0      No orders of the defined types were placed in this encounter.  Return precautions advised.  Garret Reddish, MD

## 2021-11-09 NOTE — Patient Instructions (Addendum)
Health Maintenance Due  Topic Date Due   TETANUS/TDAP - recommend at the pharmacy- let us know date though cone may log through [facility]  03/01/2019   Continue current medications- but reduce losartan to half tablet or 12.5 mg and update me in 1 week with home blood pressures. <130/80 ideally.   Please stop by lab before you go If you have mychart- we will send your results within 3 business days of [place] receiving them.  If you do not have mychart- we will call you about results within 5 business days of [place] receiving them.  *please also note that you will see labs on mychart as soon as they post. I will later go in and write notes on them- will say "notes from Dr. Yong Channel"   Verify-  triamcinolone  but if it is... -recommended trialing this consistently twice a day for 10 days and see if this calms it down- if worsens certainly let us know  Recommended follow up: Return in about 6 months (around 05/10/2022) for followup or sooner if needed.Schedule b4 you leave.

## 2021-11-10 ENCOUNTER — Encounter: Payer: Self-pay | Admitting: Family Medicine

## 2021-11-10 ENCOUNTER — Other Ambulatory Visit (HOSPITAL_BASED_OUTPATIENT_CLINIC_OR_DEPARTMENT_OTHER): Payer: Self-pay

## 2021-11-10 LAB — HEPATITIS C ANTIBODY: Hepatitis C Ab: NONREACTIVE

## 2021-11-10 MED ORDER — TETANUS-DIPHTH-ACELL PERTUSSIS 5-2.5-18.5 LF-MCG/0.5 IM SUSY
PREFILLED_SYRINGE | INTRAMUSCULAR | 0 refills | Status: DC
  Filled 2021-11-10: qty 0.5, 1d supply, fill #0

## 2021-11-12 NOTE — Progress Notes (Signed)
[place] Phone: [phone] Subjective:   [name], am serving as a scribe for Dr. Hulan Saas.  I'm seeing this patient by the request  of:  Marin Olp, MD  CC: Right upper back pain  [id number]  Jillian Escobar is a 71 y.o. female coming in with complaint of back and neck pain. OMT 10/07/2021. Patient states that she is doing ok today. Has had pain after yardwork. Massage therapist noticed trigger point in R scapula. Not too painful when she is not using arm.    Medications patient has been prescribed: None          Reviewed prior external information including notes and imaging from previsou exam, outside providers and external EMR if available.   As well as notes that were available from care everywhere and other healthcare systems.  Past medical history, social, surgical and family history all reviewed in electronic medical record.  No pertanent information unless stated regarding to the chief complaint.   Past Medical History:  Diagnosis Date   Allergy    Arthritis    GERD (gastroesophageal reflux disease)    Hx of varicella    Hyperlipidemia    borderline   Hypertension    Hypothyroidism    Pneumonia 11/2014   Only time took inhalers to treat   RBBB 10/2019   [facility] seen [facility] - f/u 6 mons, no treatment   Shingles 10/26/2010   Syncope and collapse 10/2019   Unclear etiology - just saw [facility] - f/u 6 months - no treatment   Thyroid disease     Allergies  Allergen Reactions   Levaquin [Levofloxacin]     See notes  September 2019  Palpitations irritability sleep issues       Review of Systems:  No headache, visual changes, nausea, vomiting, diarrhea, constipation, dizziness, abdominal pain, skin rash, fevers, chills, night sweats, weight loss, swollen lymph nodes, body aches, joint swelling, chest pain, shortness of breath, mood changes. POSITIVE muscle  aches  Objective  Blood pressure 102/72, pulse 74, height '5\' 3"'$  (1.6 m), weight 170 lb (77.1 kg), SpO2 98 %.   General: No apparent distress alert and oriented x3 mood and affect normal, dressed appropriately.  HEENT: Pupils equal, extraocular movements intact  Respiratory: Patient's speak in full sentences and does not appear short of breath  Significant scapular dyskinesis noted.  Patient does have some increasing kyphosis of the upper back noted as well.  Patient does have some tenderness to palpation in the paraspinal musculature otherwise.  Neck exam does have some mild loss of lordosis.  Tightness of the low back as well noted today.  Osteopathic findings  C2 flexed rotated and side bent right C6 flexed rotated and side bent right  T3 extended rotated and side bent right inhaled rib L3 flexed rotated and side bent right Sacrum right on right    Assessment and Plan:  No problem-specific Assessment & Plan notes found for this encounter.    Nonallopathic problems  Decision today to treat with OMT was based on Physical Exam  After verbal consent patient was treated with HVLA, ME, FPR techniques in cervical, rib, thoracic, lumbar, and sacral  areas  Patient tolerated the procedure well with improvement in symptoms  Patient given exercises, stretches and lifestyle modifications  See medications in patient instructions if given  Patient will follow up in 4-8 weeks    The above documentation has  been reviewed and is accurate and complete [name], DO          Note: This dictation was prepared with Dragon dictation along with smaller phrase technology. Any transcriptional errors that result from this process are unintentional.

## 2021-11-17 ENCOUNTER — Encounter: Payer: Self-pay | Admitting: Family Medicine

## 2021-11-17 ENCOUNTER — Ambulatory Visit (INDEPENDENT_AMBULATORY_CARE_PROVIDER_SITE_OTHER): Payer: Medicare Other | Admitting: Family Medicine

## 2021-11-17 VITALS — BP 102/72 | HR 74 | Ht 63.0 in | Wt 170.0 lb

## 2021-11-17 DIAGNOSIS — M9908 Segmental and somatic dysfunction of rib cage: Secondary | ICD-10-CM

## 2021-11-17 DIAGNOSIS — M5136 Other intervertebral disc degeneration, lumbar region: Secondary | ICD-10-CM

## 2021-11-17 DIAGNOSIS — M9903 Segmental and somatic dysfunction of lumbar region: Secondary | ICD-10-CM

## 2021-11-17 DIAGNOSIS — M9901 Segmental and somatic dysfunction of cervical region: Secondary | ICD-10-CM

## 2021-11-17 DIAGNOSIS — M9902 Segmental and somatic dysfunction of thoracic region: Secondary | ICD-10-CM

## 2021-11-17 DIAGNOSIS — M9904 Segmental and somatic dysfunction of sacral region: Secondary | ICD-10-CM | POA: Diagnosis not present

## 2021-11-17 NOTE — Patient Instructions (Signed)
Good luck in [facility] See me in 6-8 weeks

## 2021-11-17 NOTE — Assessment & Plan Note (Signed)
Degenerative disc disease of the lumbar spine.  Discussed the tightness of the low back.  Patient is going to continue to work on the core strengthening.  Patient did have some more discomfort in the thoracic area as well and we discussed posture and ergonomics throughout the day that could be beneficial.  Patient has been traveling somewhat.  Follow-up again in 6 to 8 weeks for further evaluation and treatment

## 2021-12-25 ENCOUNTER — Other Ambulatory Visit: Payer: Self-pay | Admitting: Internal Medicine

## 2021-12-31 ENCOUNTER — Ambulatory Visit: Payer: Medicare Other | Admitting: Family Medicine

## 2022-01-06 ENCOUNTER — Other Ambulatory Visit: Payer: Self-pay | Admitting: Family Medicine

## 2022-01-06 DIAGNOSIS — Z1231 Encounter for screening mammogram for malignant neoplasm of breast: Secondary | ICD-10-CM

## 2022-01-28 ENCOUNTER — Ambulatory Visit: Payer: Medicare Other | Admitting: Cardiology

## 2022-01-29 NOTE — Progress Notes (Unsigned)
[place] [address] [place] Phone: [phone] Subjective:   IVilma Meckel, am serving as a scribe for Dr. Hulan Saas.  I'm seeing this patient by the request  of:  Marin Olp, MD  CC: Back and neck pain follow-up  [id number]  ZITA OZIMEK is a 72 y.o. female coming in with complaint of back and neck pain. OMT 11/17/2021. Patient states doing well. Right foot is bothering her.  Patient states it is more on the medial aspect of the foot.  States that it feels a little different than the midfoot.  Starting affect daily activities.  Medications patient has been prescribed: None  Taking:         Reviewed prior external information including notes and imaging from previsou exam, outside providers and external EMR if available.   As well as notes that were available from care everywhere and other healthcare systems.  Past medical history, social, surgical and family history all reviewed in electronic medical record.  No pertanent information unless stated regarding to the chief complaint.   Past Medical History:  Diagnosis Date   Allergy    Arthritis    GERD (gastroesophageal reflux disease)    Hx of varicella    Hyperlipidemia    borderline   Hypertension    Hypothyroidism    Pneumonia 11/2014   Only time took inhalers to treat   RBBB 10/2019   [facility] seen [facility] - f/u 6 mons, no treatment   Shingles 10/26/2010   Syncope and collapse 10/2019   Unclear etiology - just saw MD - f/u 6 months - no treatment   Thyroid disease     Allergies  Allergen Reactions   Levaquin [Levofloxacin]     See notes  September 2019  Palpitations irritability sleep issues       Review of Systems:  No headache, visual changes, nausea, vomiting, diarrhea, constipation, dizziness, abdominal pain, skin rash, fevers, chills, night sweats, weight loss, swollen lymph nodes, body aches, joint swelling, chest pain, shortness  of breath, mood changes. POSITIVE muscle aches  Objective  Blood pressure 134/76, pulse 84, height '5\' 3"'$  (1.6 m), weight 174 lb (78.9 kg), SpO2 98 %.   General: No apparent distress alert and oriented x3 mood and affect normal, dressed appropriately.  HEENT: Pupils equal, extraocular movements intact  Respiratory: Patient's speak in full sentences and does not appear short of breath  Cardiovascular: No lower extremity edema, non tender, no erythema  Low back exam does have some loss of lordosis noted.  Some tenderness to palpation in the paraspinal musculature.  Patient's right foot does have tenderness to palpation more over the medial aspect of the midfoot.  This is not right over the dorsal aspect of the left foot dated has been previously.  Mild swelling noted at the navicular area as well.  Osteopathic findings  T8 extended rotated and side bent left L2 flexed rotated and side bent right Sacrum right on right       Assessment and Plan:  Arthritis of midfoot Pneumatic brace given today, patient will be traveling and doing a lot of walking and we discussed potential injection.  Will likely look at it under ultrasound at next follow-up if necessary.  Follow-up again in 4 to 5 weeks  Degenerative disc disease, lumbar Degenerative disc disease with worsening symptoms likely secondary to some of the loss of lordosis.  Patient does have tightness with FABER test right greater  than left.  Patient does have some limited range of motion noted.    Nonallopathic problems  Decision today to treat with OMT was based on Physical Exam  After verbal consent patient was treated with HVLA, ME, FPR techniques in  thoracic, lumbar, and sacral  areas  Patient tolerated the procedure well with improvement in symptoms  Patient given exercises, stretches and lifestyle modifications  See medications in patient instructions if given  Patient will follow up in 4-8 weeks     The above  documentation has been reviewed and is accurate and complete [name], DO         Note: This dictation was prepared with Dragon dictation along with smaller phrase technology. Any transcriptional errors that result from this process are unintentional.

## 2022-02-01 ENCOUNTER — Ambulatory Visit (INDEPENDENT_AMBULATORY_CARE_PROVIDER_SITE_OTHER): Payer: Medicare Other | Admitting: Family Medicine

## 2022-02-01 VITALS — BP 134/76 | HR 84 | Ht 63.0 in | Wt 174.0 lb

## 2022-02-01 DIAGNOSIS — M5136 Other intervertebral disc degeneration, lumbar region: Secondary | ICD-10-CM | POA: Diagnosis not present

## 2022-02-01 DIAGNOSIS — M19079 Primary osteoarthritis, unspecified ankle and foot: Secondary | ICD-10-CM | POA: Diagnosis not present

## 2022-02-01 DIAGNOSIS — M9902 Segmental and somatic dysfunction of thoracic region: Secondary | ICD-10-CM

## 2022-02-01 DIAGNOSIS — M9904 Segmental and somatic dysfunction of sacral region: Secondary | ICD-10-CM

## 2022-02-01 DIAGNOSIS — M9903 Segmental and somatic dysfunction of lumbar region: Secondary | ICD-10-CM

## 2022-02-01 NOTE — Assessment & Plan Note (Signed)
Pneumatic brace given today, patient will be traveling and doing a lot of walking and we discussed potential injection.  Will likely look at it under ultrasound at next follow-up if necessary.  Follow-up again in 4 to 5 weeks

## 2022-02-01 NOTE — Assessment & Plan Note (Addendum)
Degenerative disc disease with worsening symptoms likely secondary to some of the loss of lordosis.  Patient does have tightness with FABER test right greater than left.  Patient does have some limited range of motion noted.  We discussed that this could be some secondary to have patient is compensating for the foot at the moment as well.  Discussed icing regimen and home exercises, which activities to do and which ones to avoid.  Follow-up again in 6 to 8 weeks

## 2022-02-01 NOTE — Patient Instructions (Addendum)
Good to see you! Look up [place] [employer] events See you again in 4-5 weeks in case for injection

## 2022-02-10 NOTE — Progress Notes (Signed)
Cardiology Office Note:    Date:  02/23/2022   ID:  Jillian Escobar, DOB [birth year], MRN [id number]  PCP:  Marin Olp, MD  Cardiologist:  Glenetta Hew, MD  Electrophysiologist:  None   Referring MD: Marin Olp, MD   Chief Complaint: routine follow-up of palpitations and aortic insufficiency   History of Present Illness:    Jillian Escobar is a 72 y.o. female with a history of palpitations with rare PACs/PVCs noted on monitor in 2021, bifascicular block, recurrent syncope in the setting of dehydration, mild to moderate aortic insufficiency, hypertension, hyperlipidemia, hypothyroidism, and tobacco abuse as well as a family history of premature CAD who is followed by Dr. Ellyn Hack and presents today for routine follow-up.   Patient was referred to Dr. Ellyn Hack in 01/2015 after a recent ED visit for syncope. At that visit, she reported 3 episodes of syncope over the last couple of years. She also described some exertional chest discomfort and dyspnea. Treadmill Myoview was ordered for further evaluation and was low risk with no evidence of ischemia. Syncope was felt to likely be neurocardiogenic in nature based on maybe being dehydrated. PCP ordered an Echo 11/2017 for further evaluation of a murmur and this showed normal LV function and mild to moderate aortic insufficiency. Monitor in 04/2019 for further evaluation of palpitations showed underlying sinus rhythm with intermittent bundle branch block and rare PACs/ PVCs but no significant arrhythmias. Last Echo in 10/2020 for routine follow-up of aortic insufficiency was unchanged and showed LVEF of 60-65% with grade 2 diastolic dysfunction and mild to moderate aortic insufficiency.   She was last seen by Dr. Ellyn Hack in 01/2021 at which time she was doing well from a cardiac standpoint but was having knee and back pain. Of note, patient has a family history of premature CAD. Dr. Ellyn Hack discussed potentially ordered a coronary calcium  score; however, decision was made to hold off on this given BP and lipids were well controlled.  Patient presents today for follow-up. Here alone. Patient is doing well from a cardiac standpoint since last visit. She denies any chest pain. She reports some dyspnea on exertion if she is walking up a incline/ stairs but this is not new and is stable. No shortness of breath at rest or with routine activities. No orthopnea or PND. She has some mild edema of right ankle - she states she was recently diagnosed with bursitis and is being seen by Ortho. No palpitations. She occasionally has some mild lightheadedness/ dizziness normally with position changes. She does have a history of recurrent syncope as stated above. Last syncopal episode was a couple of years ago and occurred in setting of being outside in the sun all day, drinking alcohol, smoking marijuana, and sitting in a hot tub. It sounds like this was likely due to volume depletion/ dehydration. No recurrent syncopal episodes since that time.   At the very end of the visit, she did mention that she occasionally gets some right sided jaw pain with indigestion. This is always related to eating certain foods. Pain goes away with Pepcid. She is staying active. She goes to [place] 3 times per weeks and also walks anywhere from 20 minutes to 1 hour everyday. She denies any jaw pain or chest pain with these activities. This sounds like GERD and not cardiac in nature.   Past Medical History:  Diagnosis Date   Allergy    Arthritis    GERD (gastroesophageal reflux disease)  Hx of varicella    Hyperlipidemia    borderline   Hypertension    Hypothyroidism    Pneumonia 11/2014   Only time took inhalers to treat   RBBB 10/2019   recently seen at [facility] - f/u 6 mons, no treatment   Shingles 10/26/2010   Syncope and collapse 10/2019   Unclear etiology - just saw MD - f/u 6 months - no treatment   Thyroid disease     Past Surgical History:  Procedure  Laterality Date   CESAREAN SECTION     x1   CHOLECYSTECTOMY     COLONOSCOPY  2011   tics, no polyps - recall 5 yr   ESOPHAGOGASTRODUODENOSCOPY (EGD) WITH PROPOFOL N/A 12/09/2015   Procedure: ESOPHAGOGASTRODUODENOSCOPY (EGD) WITH PROPOFOL;  Surgeon: Manus Gunning, MD;  Location: [facility];  Service: Gastroenterology;  Laterality: N/A;   EXPLORATORY LAPAROTOMY     Gallbladder   POLYPECTOMY N/A 12/09/2015   Procedure: POLYPECTOMY;  Surgeon: Manus Gunning, MD;  Location: [facility];  Service: Gastroenterology;  Laterality: N/A;   TRANSTHORACIC ECHOCARDIOGRAM  10/07/2020   EF 60 to 65%.  Mild concentric LVH.  GRII DD.  Mild to moderate AI.  Normal PAP.  Normal RAP.  Normal RAP.  Stable AI   TUBAL LIGATION     WISDOM TOOTH EXTRACTION      Current Medications: Current Meds  Medication Sig   albuterol (VENTOLIN HFA) 108 (90 Base) MCG/ACT inhaler TAKE 2 PUFFS BY MOUTH EVERY 6 HOURS AS NEEDED FOR WHEEZE OR SHORTNESS OF BREATH   atorvastatin (LIPITOR) 20 MG tablet TAKE 1 TABLET BY MOUTH EVERY DAY   calcium carbonate (OS-CAL) 600 MG TABS tablet    celecoxib (CELEBREX) 200 MG capsule Take 1 capsule (200 mg total) by mouth daily.   Cholecalciferol (VITAMIN D PO) Take 2 tablets by mouth daily.   ELDERBERRY PO daily.    famotidine (PEPCID) 20 MG tablet Take 20 mg by mouth as needed for heartburn or indigestion.   hydrochlorothiazide (MICROZIDE) 12.5 MG capsule TAKE 1 CAPSULE BY MOUTH EVERY DAY   Ibuprofen-Famotidine (DUEXIS) 800-26.6 MG TABS Take 1 tablet by mouth daily as needed (do not take on day you take celebrex).   influenza vaccine adjuvanted (FLUAD) 0.5 ML injection Inject into the muscle.   KRILL OIL PO Take by mouth in the morning and at bedtime.   levothyroxine (SYNTHROID) 50 MCG tablet TAKE 1 TABLET BY MOUTH EVERY DAY   losartan (COZAAR) 25 MG tablet TAKE 1 TABLET (25 MG TOTAL) BY MOUTH DAILY.   Misc Natural Products (OSTEO BI-FLEX/5-LOXIN ADVANCED PO) Take by  mouth 2 (two) times daily at 8 am and 10 pm.   Multiple Vitamin (MULTIVITAMIN WITH MINERALS) TABS tablet Take 1 tablet by mouth daily.    OVER THE COUNTER MEDICATION Take 1 capsule by mouth daily.   sucralfate (CARAFATE) 1 g tablet Take 1 tablet (1 g total) by mouth every 6 (six) hours as needed.   TART CHERRY PO Take 1 tablet by mouth daily.   Tdap (BOOSTRIX) 5-2.5-18.5 LF-MCG/0.5 injection Inject into the muscle.   Turmeric 500 MG TABS Take 1 tablet by mouth 2 (two) times daily.   vitamin A 3 MG (10000 UNITS) capsule Take 10,000 Units by mouth daily.     Allergies:   Levaquin [levofloxacin]   Social History   Socioeconomic History   Marital status: Married    Spouse name: Not on file   Number of children: 1   Years of  education: Not on file   Highest education level: Associate degree: academic program  Occupational History   Not on file  Tobacco Use   Smoking status: Former    Types: Cigarettes    Quit date: 01/04/1982    Years since quitting: 40.1   Smokeless tobacco: Never   Tobacco comments:    Marijuana daily  Vaping Use   Vaping Use: Former  Substance and Sexual Activity   Alcohol use: Yes    Alcohol/week: 14.0 standard drinks of alcohol    Types: 14 Standard drinks or equivalent per week    Comment: daily- 2 a night    Drug use: Yes    Types: Marijuana    Comment: Last use 10/15/19- Marijuana Daily   Sexual activity: Yes    Birth control/protection: Post-menopausal  Other Topics Concern   Not on file  Social History Narrative   Married, daughter in [place]- no grandkids- has grandpups   - no pets but dog sits   - Originally from [place], moved over 20 years ago      Retired from [employer] oct 2020- had [place]      Hobbies: traveling, [place], dogsitting   Social Determinants of Health   Financial Resource Strain: [place]  (05/04/2021)   Overall Financial Resource Strain (CARDIA)    Difficulty of Paying Living Expenses: Not hard at  all  Food Insecurity: No Food Insecurity (05/04/2021)   Hunger Vital Sign    Worried About Running Out of Food in the Last Year: Never true    [place] in the Last Year: Never true  Transportation Needs: No Transportation Needs (05/04/2021)   PRAPARE - Hydrologist (Medical): No    Lack of Transportation (Non-Medical): No  Physical Activity: Sufficiently Active (05/04/2021)   Exercise Vital Sign    Days of Exercise per Week: 7 days    Minutes of Exercise per Session: 30 min  Stress: No Stress Concern Present (05/04/2021)   [place]    Feeling of Stress : Not at all  Social Connections: Moderately Integrated (02/21/2021)   Social Connection and Isolation Panel [NHANES]    Frequency of Communication with Friends and Family: Three times a week    Frequency of Social Gatherings with Friends and Family: Once a week    Attends Religious Services: Never    Marine scientist or Organizations: Yes    Attends Music therapist: More than 4 times per year    Marital Status: Married     Family History: The patient's family history includes Arrhythmia in her daughter; COPD in her father; Colon cancer in her cousin; Dementia in her father; Glaucoma in her father; Heart disease in her father and mother; Ulcerative colitis in her brother and sister. There is no history of Colon polyps, Esophageal cancer, Rectal cancer, or Stomach cancer.  ROS:   Please see the history of present illness.      EKGs/Labs/Other Studies Reviewed:    The following studies were reviewed:  Myoview 01/14/2015: Nuclear stress EF: 71%. The left ventricular ejection fraction is hyperdynamic (>65%). There was no ST segment deviation noted during stress. The study is normal. This is a low risk study.   Normal exercise nuclear study demonstrating normal myocardial perfusion and function, EF 71% post stress. The  patient exercised to a peak HR of 144 ((91% APMHR) and workload of  9.5 mets. Duke treadmill score 9. _______________  Monitor 04/2019: 10-day event monitor showed sinus rhythm with rate ranging from 46-131 bpm. Average heart rate 73 bpm. Rhythm was 100% sinus with intermittent bundle branch block. Very Rare isolated PACs and PVCs noted. No couplets or triplets noted. No bigeminy. No arrhythmias-tachycardia or bradycardia. No pauses   Normal monitor _______________  Echocardiogram 10/07/2020: Impressions:  1. Left ventricular ejection fraction, by estimation, is 60 to 65%. The  left ventricle has normal function. The left ventricle has no regional  wall motion abnormalities. There is mild concentric left ventricular  hypertrophy. Left ventricular diastolic  parameters are consistent with Grade II diastolic dysfunction  (pseudonormalization). The average left ventricular global longitudinal  strain is -19.4 %. The global longitudinal strain is normal.   2. Right ventricular systolic function is normal. The right ventricular  size is normal. There is normal pulmonary artery systolic pressure.   3. The mitral valve is normal in structure. Trivial mitral valve  regurgitation. No evidence of mitral stenosis.   4. The aortic valve is normal in structure. Aortic valve regurgitation is  mild to moderate. No aortic stenosis is present.   5. The inferior vena cava is normal in size with greater than 50%  respiratory variability, suggesting right atrial pressure of 3 mmHg.   EKG:  EKG  ordered today. EKG personally reviewed and demonstrates sinus bradycardia, rate 59 bpm, with RBBB, LAFB, and LVH. No acute ST/T changes compared to prior tracings.   Recent Labs: 02/25/2021: Hemoglobin 14.2; Platelets 304.0 11/09/2021: ALT 15; BUN 17; Creatinine, Ser 0.75; Potassium 3.7; Sodium 139; TSH 1.57  Recent Lipid Panel    Component Value Date/Time   CHOL 122 07/14/2021 0837   TRIG 82 07/14/2021 0837    HDL 43 07/14/2021 0837   CHOLHDL 2.8 07/14/2021 0837   CHOLHDL 4.1 08/21/2019 0915   VLDL 18.4 07/26/2018 0803   LDLCALC 63 07/14/2021 0837   LDLCALC 133 (H) 08/21/2019 0915   LDLDIRECT 158.4 08/14/2012 0828    Physical Exam:    Vital Signs: BP 134/72   Pulse (!) 59   Ht 5' 3"$  (1.6 m)   Wt 176 lb 9.6 oz (80.1 kg)   LMP  (LMP Unknown)   SpO2 98%   BMI 31.28 kg/m     Wt Readings from Last 3 Encounters:  02/23/22 176 lb 9.6 oz (80.1 kg)  02/01/22 174 lb (78.9 kg)  11/17/21 170 lb (77.1 kg)     General: 72 y.o. Caucasian female in no acute distress. HEENT: Normocephalic and atraumatic. Sclera clear.  Neck: Supple. No carotid bruits. No JVD. Heart: RRR. Distinct S1 and S2. Soft I-II/VI systolic murmur noted.   Lungs: No increased work of breathing. Clear to ausculation bilaterally. No wheezes, rhonchi, or rales.  Abdomen: Soft, non-distended, and non-tender to palpation.  Extremities: Mild edema of right ankle. Skin: Warm and dry. Neuro: Alert and oriented x3. No focal deficits. Psych: Normal affect. Responds appropriately.  Assessment:    1. Palpitations   2. Aortic valve insufficiency, etiology of cardiac valve disease unspecified   3. History of syncope   4. Primary hypertension   5. Hyperlipidemia, unspecified hyperlipidemia type   6. Family history of premature CAD     Plan:    Palpitations Bifascicular Block Patient has a history of palpitations. Monitor in 04/2019 showed sinus rhythm with intermittent bundle branch block and rare PACs/ PVCs but no significant arrhythmias. - EKG today shows normal sinus rhythm with known bifascicular  block (RBBB and LAFB). Unchanged from EKG in 01/2021.  - Stable. No recent palpitations.  - Not on any AV nodal agents.   Aortic Insufficiency Last Echo in 10/2020 showed normal LV function and mild to moderate aortic insufficiency. - Plan is for repeat Echo in 2025.  History of Syncope Patient has a history of recurrent  syncope. Cardiac work-up has been unremarkable. Monitor in 2021 showed no significant arrhythmias. Syncope was felt to be neurocardiogenic in setting of dehydration. Last syncopal episodes was a couple of years ago and again it sounds like it took place in setting of dehydration and volume depletion.  - No recurrence.  - Advised patient to let us know if she has any recurrent syncopal episodes.   Hypertension BP borderline elevated at 134/72 today. - Continue Losartan 57m daily and HCTZ 12.559mdaily. - Advised patient to continue to monitor BP at home and notify usKoreaf consistently > 130/80.  Hyperlipidemia Lipid panel in 07/2021: Total Cholesterol 122, Triglycerides 82, HDL 43, LDL 63.  - Continue Lipitor 2055maily.  Family History of Premature CAD Patient has a family history of CAD.  - She reports some intermittent jaw pain with indigestion after eating certain meals that resolves with Pepcid. She denies any exertional symptoms.  - Symptoms sound like GERD. She does not have anything that sounds like angina. - Given family history, will order coronary calcium score for further risk stratification. - Advised patient to let us [place] if symptoms worsen, become unrelated to meals, or if she has any exertional symptoms.    Disposition: Follow up in 1 year.    Medication Adjustments/Labs and Tests Ordered: Current medicines are reviewed at length with the patient today.  Concerns regarding medicines are outlined above.  Orders Placed This Encounter  Procedures   CT CARDIAC SCORING (SELF PAY ONLY)   EKG 12-Lead   No orders of the defined types were placed in this encounter.   Patient Instructions  Medication Instructions:  No changes *If you need a refill on your cardiac medications before your next appointment, please call your pharmacy*  Testing/Procedures: CalSande RivesA-C has ordered a CT coronary calcium score.   [place].   Coronary CalciumScan A coronary calcium scan is an imaging test used to look for deposits of calcium and other fatty materials (plaques) in the inner lining of the blood vessels of the heart (coronary arteries). These deposits of calcium and plaques can partly clog and narrow the coronary arteries without producing any symptoms or warning signs. This puts a person at risk for a heart attack. This test can detect these deposits before symptoms develop. Tell a health care provider about: Any allergies you have. All medicines you are taking, including vitamins, herbs, eye drops, creams, and over-the-counter medicines. Any problems you or family members have had with anesthetic medicines. Any blood disorders you have. Any surgeries you have had. Any medical conditions you have. Whether you are pregnant or may be pregnant. What are the risks? Generally, this is a safe procedure. However, problems may occur, including: Harm to a pregnant woman and her unborn baby. This test involves the use of radiation. Radiation exposure can be dangerous to a pregnant woman and her unborn baby. If you are pregnant, you generally should not have this procedure done. Slight increase in the risk of cancer. This is because of the radiation involved in the test. What  happens before the procedure? No preparation is needed for this procedure. What happens during the procedure? You will undress and remove any jewelry around your neck or chest. You will put on a hospital gown. Sticky electrodes will be placed on your chest. The electrodes will be connected to an electrocardiogram (ECG) machine to record a tracing of the electrical activity of your heart. A CT scanner will take pictures of your heart. During this time, you will be asked to lie still and hold your breath for 2-3 seconds while a picture of your heart is being taken. The procedure may vary among health care  providers and hospitals. What happens after the procedure? You can get dressed. You can return to your normal activities. It is up to you to get the results of your test. Ask your health care provider, or the department that is doing the test, when your results will be ready. Summary A coronary calcium scan is an imaging test used to look for deposits of calcium and other fatty materials (plaques) in the inner lining of the blood vessels of the heart (coronary arteries). Generally, this is a safe procedure. Tell your health care provider if you are pregnant or may be pregnant. No preparation is needed for this procedure. A CT scanner will take pictures of your heart. You can return to your normal activities after the scan is done. This information is not intended to replace advice given to you by your health care provider. Make sure you discuss any questions you have with your health care provider. Document Released: 06/19/2007 Document Revised: 11/10/2015 Document Reviewed: 11/10/2015 Elsevier Interactive Patient Education  2017 [place]: At [facility], you and your health needs are our priority.  As part of our continuing mission to provide you with exceptional heart care, we have created designated Provider Care Teams.  These Care Teams include your primary Cardiologist (physician) and Advanced Practice Providers (APPs -  Physician Assistants and Nurse Practitioners) who all work together to provide you with the care you need, when you need it.  We recommend signing up for the patient portal called "MyChart".  Sign up information is provided on this After Visit Summary.  MyChart is used to connect with patients for Virtual Visits (Telemedicine).  Patients are able to view lab/test results, encounter notes, upcoming appointments, etc.  Non-urgent messages can be sent to your provider as well.   To learn more about what you can do with MyChart, go to  [email or web address].    Your next appointment:   1 year(s)  Provider:   Glenetta Hew, MD  OR APP   Other Instructions If BP consistently over 130/80, then please notify us.     Signed, Darreld Mclean, PA-C  02/23/2022 7:01 PM    [facility]

## 2022-02-23 ENCOUNTER — Ambulatory Visit
Admission: RE | Admit: 2022-02-23 | Discharge: 2022-02-23 | Disposition: A | Discharge status: Home / Self Care | Payer: Medicare Other | Source: Ambulatory Visit | Attending: Family Medicine | Admitting: Family Medicine

## 2022-02-23 ENCOUNTER — Encounter: Payer: Self-pay | Admitting: Student

## 2022-02-23 ENCOUNTER — Ambulatory Visit: Payer: Medicare Other | Attending: Cardiology | Admitting: Student

## 2022-02-23 VITALS — BP 134/72 | HR 59 | Ht 63.0 in | Wt 176.6 lb

## 2022-02-23 DIAGNOSIS — I351 Nonrheumatic aortic (valve) insufficiency: Secondary | ICD-10-CM | POA: Diagnosis present

## 2022-02-23 DIAGNOSIS — E785 Hyperlipidemia, unspecified: Secondary | ICD-10-CM

## 2022-02-23 DIAGNOSIS — Z1231 Encounter for screening mammogram for malignant neoplasm of breast: Secondary | ICD-10-CM

## 2022-02-23 DIAGNOSIS — I10 Essential (primary) hypertension: Secondary | ICD-10-CM | POA: Insufficient documentation

## 2022-02-23 DIAGNOSIS — Z8249 Family history of ischemic heart disease and other diseases of the circulatory system: Secondary | ICD-10-CM | POA: Diagnosis present

## 2022-02-23 DIAGNOSIS — R002 Palpitations: Secondary | ICD-10-CM | POA: Insufficient documentation

## 2022-02-23 DIAGNOSIS — Z87898 Personal history of other specified conditions: Secondary | ICD-10-CM | POA: Insufficient documentation

## 2022-02-23 NOTE — Patient Instructions (Signed)
Medication Instructions:  No changes *If you need a refill on your cardiac medications before your next appointment, please call your pharmacy*  Testing/Procedures: Sande Rives, PA-C has ordered a CT coronary calcium score.   Test locations:  [place]   This is $99 out of pocket.   Coronary CalciumScan A coronary calcium scan is an imaging test used to look for deposits of calcium and other fatty materials (plaques) in the inner lining of the blood vessels of the heart (coronary arteries). These deposits of calcium and plaques can partly clog and narrow the coronary arteries without producing any symptoms or warning signs. This puts a person at risk for a heart attack. This test can detect these deposits before symptoms develop. Tell a health care provider about: Any allergies you have. All medicines you are taking, including vitamins, herbs, eye drops, creams, and over-the-counter medicines. Any problems you or family members have had with anesthetic medicines. Any blood disorders you have. Any surgeries you have had. Any medical conditions you have. Whether you are pregnant or may be pregnant. What are the risks? Generally, this is a safe procedure. However, problems may occur, including: Harm to a pregnant woman and her unborn baby. This test involves the use of radiation. Radiation exposure can be dangerous to a pregnant woman and her unborn baby. If you are pregnant, you generally should not have this procedure done. Slight increase in the risk of cancer. This is because of the radiation involved in the test. What happens before the procedure? No preparation is needed for this procedure. What happens during the procedure? You will undress and remove any jewelry around your neck or chest. You will put on a hospital gown. Sticky electrodes will be placed on your chest. The electrodes will be connected to an electrocardiogram (ECG) machine to  record a tracing of the electrical activity of your heart. A CT scanner will take pictures of your heart. During this time, you will be asked to lie still and hold your breath for 2-3 seconds while a picture of your heart is being taken. The procedure may vary among health care providers and hospitals. What happens after the procedure? You can get dressed. You can return to your normal activities. It is up to you to get the results of your test. Ask your health care provider, or the department that is doing the test, when your results will be ready. Summary A coronary calcium scan is an imaging test used to look for deposits of calcium and other fatty materials (plaques) in the inner lining of the blood vessels of the heart (coronary arteries). Generally, this is a safe procedure. Tell your health care provider if you are pregnant or may be pregnant. No preparation is needed for this procedure. A CT scanner will take pictures of your heart. You can return to your normal activities after the scan is done. This information is not intended to replace advice given to you by your health care provider. Make sure you discuss any questions you have with your health care provider. Document Released: 06/19/2007 Document Revised: 11/10/2015 Document Reviewed: 11/10/2015 Elsevier Interactive Patient Education  2017 [place]: At [facility], you and your health needs are our priority.  As part of our continuing mission to provide you with exceptional heart care, we have created designated Provider Care Teams.  These Care Teams include your primary Cardiologist (physician) and Advanced Practice Providers (APPs -  Physician Assistants  and Nurse Practitioners) who all work together to provide you with the care you need, when you need it.  We recommend signing up for the patient portal called "MyChart".  Sign up information is provided on this After Visit Summary.  MyChart is used to  connect with patients for Virtual Visits (Telemedicine).  Patients are able to view lab/test results, encounter notes, upcoming appointments, etc.  Non-urgent messages can be sent to your provider as well.   To learn more about what you can do with MyChart, go to [email or web address].    Your next appointment:   1 year(s)  Provider:   Glenetta Hew, MD  OR APP   Other Instructions If BP consistently over 130/80, then please notify us.

## 2022-03-03 NOTE — Progress Notes (Signed)
[place] [facility] Phone: [phone] Subjective:   Jillian Escobar, am serving as a scribe for Dr. Hulan Saas.  I'm seeing this patient by the request  of:  Marin Olp, MD  CC: Neck and back pain:  [id number]  Jillian Escobar is a 72 y.o. female coming in with complaint of back and neck pain. OMT on 02/01/2022. Also seen for foot pain Patient states that at night she will have a burning in her medial longitudinal arch that is unbearable while she is sitting watching tv. Pain is better with brace but she feels she is not making much progress.   Also c/o R sided posterior hip pain in the glute. Unable to pick things up off floor due to tightness.   Medications patient has been prescribed:   Taking:         Reviewed prior external information including notes and imaging from previsou exam, outside providers and external EMR if available.   As well as notes that were available from care everywhere and other healthcare systems.  Past medical history, social, surgical and family history all reviewed in electronic medical record.  Escobar pertanent information unless stated regarding to the chief complaint.   Past Medical History:  Diagnosis Date   Allergy    Arthritis    GERD (gastroesophageal reflux disease)    Hx of varicella    Hyperlipidemia    borderline   Hypertension    Hypothyroidism    Pneumonia 11/2014   Only time took inhalers to treat   RBBB 10/2019   recently seen at [facility] - f/u 6 mons, Escobar treatment   Shingles 10/26/2010   Syncope and collapse 10/2019   Unclear etiology - just saw MD - f/u 6 months - Escobar treatment   Thyroid disease     Allergies  Allergen Reactions   Levaquin [Levofloxacin]     See notes  September 2019  Palpitations irritability sleep issues       Review of Systems:  Escobar headache, visual changes, nausea, vomiting, diarrhea, constipation, dizziness, abdominal pain,  skin rash, fevers, chills, night sweats, weight loss, swollen lymph nodes, body aches, joint swelling, chest pain, shortness of breath, mood changes. POSITIVE muscle aches  Objective  Blood pressure 112/72, pulse 72, height '5\' 3"'$  (1.6 m), weight 176 lb (79.8 kg), SpO2 98 %.   General: Escobar apparent distress alert and oriented x3 mood and affect normal, dressed appropriately.  HEENT: Pupils equal, extraocular movements intact  Respiratory: Patient's speak in full sentences and does not appear short of breath  Cardiovascular: Escobar lower extremity edema, non tender, Escobar erythema  Patient does have some increase in kyphosis of the upper back.  Tightness noted with FABER test bilaterally.  Negative range of motion in all planes of the lower back.  Severe tenderness over the right sacroiliac joint.  Right foot still has the rigid midfoot noted.  Osteopathic findings  C4 flexed rotated and side bent left T3 extended rotated and side bent right inhaled rib T4 extended rotated and side bent left L2 flexed rotated and side bent right L5 flexed rotated and side bent left Sacrum right on right  Limited muscular skeletal ultrasound was performed and interpreted by Hulan Saas, M  Limited ultrasound of the hip flexors the patient does have hypoechoic changes in this area.  Patient does have significant narrowing of the entire midfoot noted.  Increasing in Doppler flow the first and  second MCPs Impression: Midfoot pain with exacerbation  Procedure: Real-time Ultrasound Guided Injection of midfoot Device: GE Logiq Q7 Ultrasound guided injection is preferred based studies that show increased duration, increased effect, greater accuracy, decreased procedural pain, increased response rate, and decreased cost with ultrasound guided versus blind injection.  Verbal informed consent obtained.  Time-out conducted.  Noted Escobar overlying erythema, induration, or other signs of local infection.  Skin prepped in a  sterile fashion.  Local anesthesia: Topical Ethyl chloride.  With sterile technique and under real time ultrasound guidance: With a 25-gauge half inch needle injected into the foot near the second and third toes with a total of 0.5 cc of 0.5% Marcaine and 0.5 cc of Kenalog 40 g/mL Completed without difficulty  Pain immediately resolved suggesting accurate placement of the medication.  Advised to call if fevers/chills, erythema, induration, drainage, or persistent bleeding.  Impression: Technically successful ultrasound guided injection.     Assessment and Plan:  Degenerative disc disease, lumbar Arthritic changes of the back noted.  Still seems that more secondary to the sacroiliac joint as well but seems to compensate for that.  Does seem to respond well to osteopathic manipulation.  I do believe exacerbation secondary to some of the midfoot pain.  Increase activity slowly.  I discussed proper shoe wear, continue to work on home exercises and follow-up again in 6- 8 weeks   Arthritis of midfoot Chronic problem with worsening symptoms, injection given today, will be traveling and doing a lot of walking so hopefully this will be significantly beneficial.  Worsening pain will consider prednisone for her trip.  Follow-up again in 6 to 8 weeks    Nonallopathic problems  Decision today to treat with OMT was based on Physical Exam  After verbal consent patient was treated with HVLA, ME, FPR techniques in cervical, rib, thoracic, lumbar, and sacral  areas  Patient tolerated the procedure well with improvement in symptoms  Patient given exercises, stretches and lifestyle modifications  See medications in patient instructions if given  Patient will follow up in 6-8 weeks    The above documentation has been reviewed and is accurate and complete [name], DO          Note: This dictation was prepared with Dragon dictation along with smaller phrase technology. Any transcriptional  errors that result from this process are unintentional.

## 2022-03-04 ENCOUNTER — Ambulatory Visit (INDEPENDENT_AMBULATORY_CARE_PROVIDER_SITE_OTHER): Payer: Medicare Other | Admitting: Family Medicine

## 2022-03-04 ENCOUNTER — Ambulatory Visit: Payer: Self-pay

## 2022-03-04 VITALS — BP 112/72 | HR 72 | Ht 63.0 in | Wt 176.0 lb

## 2022-03-04 DIAGNOSIS — M19071 Primary osteoarthritis, right ankle and foot: Secondary | ICD-10-CM

## 2022-03-04 DIAGNOSIS — M9903 Segmental and somatic dysfunction of lumbar region: Secondary | ICD-10-CM | POA: Diagnosis not present

## 2022-03-04 DIAGNOSIS — M9904 Segmental and somatic dysfunction of sacral region: Secondary | ICD-10-CM

## 2022-03-04 DIAGNOSIS — G8929 Other chronic pain: Secondary | ICD-10-CM

## 2022-03-04 DIAGNOSIS — M25571 Pain in right ankle and joints of right foot: Secondary | ICD-10-CM

## 2022-03-04 DIAGNOSIS — M9908 Segmental and somatic dysfunction of rib cage: Secondary | ICD-10-CM

## 2022-03-04 DIAGNOSIS — M9901 Segmental and somatic dysfunction of cervical region: Secondary | ICD-10-CM

## 2022-03-04 DIAGNOSIS — M5136 Other intervertebral disc degeneration, lumbar region: Secondary | ICD-10-CM | POA: Diagnosis not present

## 2022-03-04 DIAGNOSIS — M19079 Primary osteoarthritis, unspecified ankle and foot: Secondary | ICD-10-CM

## 2022-03-04 DIAGNOSIS — M9902 Segmental and somatic dysfunction of thoracic region: Secondary | ICD-10-CM

## 2022-03-04 NOTE — Patient Instructions (Addendum)
Injected foot today Have a great time traveling Use brace as needed See you in April

## 2022-03-04 NOTE — Assessment & Plan Note (Signed)
Arthritic changes of the back noted.  Still seems that more secondary to the sacroiliac joint as well but seems to compensate for that.  Does seem to respond well to osteopathic manipulation.  I do believe exacerbation secondary to some of the midfoot pain.  Increase activity slowly.  I discussed proper shoe wear, continue to work on home exercises and follow-up again in 6- 8 weeks

## 2022-03-04 NOTE — Assessment & Plan Note (Signed)
Chronic problem with worsening symptoms, injection given today, will be traveling and doing a lot of walking so hopefully this will be significantly beneficial.  Worsening pain will consider prednisone for her trip.  Follow-up again in 6 to 8 weeks

## 2022-03-15 DIAGNOSIS — Z0289 Encounter for other administrative examinations: Secondary | ICD-10-CM

## 2022-03-18 ENCOUNTER — Ambulatory Visit (INDEPENDENT_AMBULATORY_CARE_PROVIDER_SITE_OTHER): Payer: Medicare Other | Admitting: Family Medicine

## 2022-03-18 ENCOUNTER — Encounter (INDEPENDENT_AMBULATORY_CARE_PROVIDER_SITE_OTHER): Payer: Self-pay | Admitting: Family Medicine

## 2022-03-18 VITALS — BP 129/75 | HR 73 | Temp 98.1°F | Ht 63.0 in | Wt 170.0 lb

## 2022-03-18 DIAGNOSIS — Z6830 Body mass index (BMI) 30.0-30.9, adult: Secondary | ICD-10-CM

## 2022-03-18 DIAGNOSIS — E669 Obesity, unspecified: Secondary | ICD-10-CM

## 2022-03-18 DIAGNOSIS — R5383 Other fatigue: Secondary | ICD-10-CM | POA: Diagnosis not present

## 2022-03-18 DIAGNOSIS — E785 Hyperlipidemia, unspecified: Secondary | ICD-10-CM

## 2022-03-18 DIAGNOSIS — Z1331 Encounter for screening for depression: Secondary | ICD-10-CM | POA: Diagnosis not present

## 2022-03-18 DIAGNOSIS — E559 Vitamin D deficiency, unspecified: Secondary | ICD-10-CM

## 2022-03-18 DIAGNOSIS — R0602 Shortness of breath: Secondary | ICD-10-CM | POA: Diagnosis not present

## 2022-03-18 DIAGNOSIS — E039 Hypothyroidism, unspecified: Secondary | ICD-10-CM | POA: Diagnosis not present

## 2022-03-18 DIAGNOSIS — I10 Essential (primary) hypertension: Secondary | ICD-10-CM

## 2022-03-18 DIAGNOSIS — R739 Hyperglycemia, unspecified: Secondary | ICD-10-CM

## 2022-03-18 NOTE — Progress Notes (Deleted)
Patient attended [facility] talk.  She is retired and so is her husband (mostly); he works in the mail room 4 hours a day.  She lives at home with her husband Hassell Done.  She started gaining weight during menopause.  She likes to cook and doesn't dislike most foods. She is going to [employer]. At times tries to be a vegetarian.  She counts calories daily.  Coffee in the am with sugar in the raw (1-2 packets) and 1/4c half and half.  May have 1 boiled egg and 1-2 pieces of toast and 1 cup granola and 1 cup almond milk (occasionally drinks the milk afterwards) (feels satisfied).  Wants something sweet and has a handful of M&M's after of dark chocolate shortbread cookies.  Lunch time she eats tuna with mayo and celery (eats entire can) with a couple tablespoons of mayo or half chicken breast of chicken parm, 1 cup spaghetti and salad (felt satisfied).  Dinner was plant based beef tips (quarter of a cup) and 2 cups rice or steak 5oz with baked potato with butter and sour cream or vegetable.  Before dinner is 2 beers or bourbon. After dinner is chocolate- entemann's chocolate donut.

## 2022-03-19 LAB — COMPREHENSIVE METABOLIC PANEL
ALT: 16 IU/L (ref 0–32)
AST: 21 IU/L (ref 0–40)
Albumin/Globulin Ratio: 1.9 (ref 1.2–2.2)
Albumin: 4.5 g/dL (ref 3.9–4.9)
Alkaline Phosphatase: 84 IU/L (ref 44–121)
BUN/Creatinine Ratio: 17 (ref 12–28)
BUN: 14 mg/dL (ref 8–27)
Bilirubin Total: 1 mg/dL (ref 0.0–1.2)
CO2: 25 mmol/L (ref 20–29)
Calcium: 10.4 mg/dL — ABNORMAL HIGH (ref 8.7–10.3)
Chloride: 102 mmol/L (ref 96–106)
Creatinine, Ser: 0.82 mg/dL (ref 0.57–1.00)
Globulin, Total: 2.4 g/dL (ref 1.5–4.5)
Glucose: 104 mg/dL — ABNORMAL HIGH (ref 70–99)
Potassium: 4.7 mmol/L (ref 3.5–5.2)
Sodium: 140 mmol/L (ref 134–144)
Total Protein: 6.9 g/dL (ref 6.0–8.5)
eGFR: 77 mL/min/{1.73_m2} (ref >59)

## 2022-03-19 LAB — CBC WITH DIFFERENTIAL/PLATELET
Basophils Absolute: 0.1 10*3/uL (ref 0.0–0.2)
Basos: 2 %
EOS (ABSOLUTE): 0.2 10*3/uL (ref 0.0–0.4)
Eos: 5 %
Hematocrit: 43.3 % (ref 34.0–46.6)
Hemoglobin: 14.8 g/dL (ref 11.1–15.9)
Immature Grans (Abs): 0 10*3/uL (ref 0.0–0.1)
Immature Granulocytes: 0 %
Lymphocytes Absolute: 1.5 10*3/uL (ref 0.7–3.1)
Lymphs: 36 %
MCH: 31.2 pg (ref 26.6–33.0)
MCHC: 34.2 g/dL (ref 31.5–35.7)
MCV: 91 fL (ref 79–97)
Monocytes Absolute: 0.4 10*3/uL (ref 0.1–0.9)
Monocytes: 10 %
Neutrophils Absolute: 1.9 10*3/uL (ref 1.4–7.0)
Neutrophils: 47 %
Platelets: 300 10*3/uL (ref 150–450)
RBC: 4.74 x10E6/uL (ref 3.77–5.28)
RDW: 12.6 % (ref 11.7–15.4)
WBC: 4.1 10*3/uL (ref 3.4–10.8)

## 2022-03-19 LAB — LIPID PANEL WITH LDL/HDL RATIO
Cholesterol, Total: 152 mg/dL (ref 100–199)
HDL: 60 mg/dL (ref >39)
LDL Chol Calc (NIH): 77 mg/dL (ref 0–99)
LDL/HDL Ratio: 1.3 ratio (ref 0.0–3.2)
Triglycerides: 77 mg/dL (ref 0–149)
VLDL Cholesterol Cal: 15 mg/dL (ref 5–40)

## 2022-03-19 LAB — HEMOGLOBIN A1C
Est. average glucose Bld gHb Est-mCnc: 111 mg/dL
Hgb A1c MFr Bld: 5.5 % (ref 4.8–5.6)

## 2022-03-19 LAB — THYROID PANEL WITH TSH
Free Thyroxine Index: 3.1 (ref 1.2–4.9)
T3 Uptake Ratio: 38 % (ref 24–39)
T4, Total: 8.2 ug/dL (ref 4.5–12.0)
TSH: 3.78 u[IU]/mL (ref 0.450–4.500)

## 2022-03-19 LAB — INSULIN, RANDOM: INSULIN: 14.5 u[IU]/mL (ref 2.6–24.9)

## 2022-03-19 LAB — VITAMIN D 25 HYDROXY (VIT D DEFICIENCY, FRACTURES): Vit D, 25-Hydroxy: 58.7 ng/mL (ref 30.0–100.0)

## 2022-03-24 NOTE — Progress Notes (Signed)
Chief Complaint:   OBESITY Jillian Escobar (MR# [id number]) is a 72 y.o. female who presents for evaluation and treatment of obesity and related comorbidities. Current BMI is Body mass index is 30.11 kg/m. Jillian Escobar has been struggling with her weight for many years and has been unsuccessful in either losing weight, maintaining weight loss, or reaching her healthy weight goal.  Patient attended [place] talk. She is retired and so is her husband (mostly); he works in the mail room 4 hours a day. She lives at home with her husband Jillian Escobar. She started gaining weight during menopause. She likes to cook and doesn't dislike most foods. She is going to [employer]. At times tries to be a vegetarian. She counts calories daily. Coffee in the am with sugar in the raw (1-2 packets) and 1/4c half and half. May have 1 boiled egg and 1-2 pieces of toast and 1 cup granola and 1 cup almond milk (occasionally drinks the milk afterwards) (feels satisfied). Wants something sweet and has a handful of M&M's after of dark chocolate shortbread cookies. Lunch time she eats tuna with mayo and celery (eats entire can) with a couple tablespoons of mayo or half chicken breast of chicken parm, 1 cup spaghetti and salad (felt satisfied). Dinner was plant based beef tips (quarter of a cup) and 2 cups rice or steak 5oz with baked potato with butter and sour cream or vegetable. Before dinner is 2 beers or bourbon. After dinner is chocolate- entemann's chocolate donut.   Jillian Escobar is currently in the action stage of change and ready to dedicate time achieving and maintaining a healthier weight. Jillian Escobar is interested in becoming our patient and working on intensive lifestyle modifications including (but not limited to) diet and exercise for weight loss.  Jillian Escobar's habits were reviewed today and are as follows: Her family eats meals together, she thinks her family will eat healthier with her, her desired weight loss is 10 lbs, she started  gaining weight after menopause, her heaviest weight ever was 170 pounds, she is a picky eater and doesn't like to eat healthier foods, she has significant food cravings issues, she snacks frequently in the evenings, she is trying to follow a vegetarian diet, she is frequently drinking liquids with calories, she frequently makes poor food choices, she has problems with excessive hunger, and she struggles with emotional eating.  Depression Screen Jillian Escobar's Food and Mood (modified PHQ-9) score was 6.  Subjective:   1. Other fatigue Jillian Escobar denies daytime somnolence and denies waking up still tired. Patient has a history of symptoms of n/a. Jillian Escobar generally gets  7-9  hours of sleep per night, and states that she has generally restful sleep. Snoring is present. Apneic episodes are not present. Epworth Sleepiness Score is 7.  EKG Escobar February 23, 2022, bradycardia, RBBB.  2. SOB (shortness of breath) on exertion Jillian Escobar notes increasing shortness of breath with exercising and seems to be worsening over time with weight gain. She notes getting out of breath sooner with activity than she used to. This has not gotten worse recently. Jillian Escobar denies shortness of breath at rest or orthopnea.  3. Hyperlipidemia, unspecified hyperlipidemia type Patient is on atorvastatin 20 mg.  Patient was diagnosed about 5 years ago and has not been on any other medication besides above.  Most recent LDL was 67.  4. Hypothyroidism, unspecified type Patient is on levothyroxine 50 mcg.  Ultrasound in 2023 essentially normal.  Last TSH within normal limits.  5. Essential  hypertension Patient was diagnosed many years ago she is taking losartan and HCTZ patient denies chest pain, chest pressure, headache.  6. Vitamin D deficiency Patient is on OTC vitamin D.  Patient is positive for fatigue.  Diagnosis likely given obesity.  7. Hyperglycemia Patient has elevated blood sugars in the past.  No recent A1c.  Assessment/Plan:   1.  Other fatigue Jillian Escobar does feel that her weight is causing her energy to be lower than it should be. Fatigue may be related to obesity, depression or many other causes. Labs will be ordered, and in the meanwhile, Jillian Escobar will focus on self care including making healthy food choices, increasing physical activity and focusing on stress reduction.  Check IC, labs today.  2. SOB (shortness of breath) on exertion Jillian Escobar does feel that she gets out of breath more easily that she used to when she exercises. Jillian Escobar's shortness of breath appears to be obesity related and exercise induced. She has agreed to work on weight loss and gradually increase exercise to treat her exercise induced shortness of breath. Will continue to monitor closely.  - CBC w/Diff/Platelet  3. Hyperlipidemia, unspecified hyperlipidemia type Check labs today.  - Lipid Panel With LDL/HDL Ratio  4. Hypothyroidism, unspecified type Check labs today.  - Thyroid Panel With TSH  5. Essential hypertension Check labs today.  - Comprehensive metabolic panel  6. Vitamin D deficiency Check labs today.  - VITAMIN D 25 Hydroxy (Vit-D Deficiency, Fractures)  7. Hyperglycemia Check labs today. - Hemoglobin A1c - Insulin, random  8. Depression screen Jillian Escobar had a positive depression screening. Depression is commonly associated with obesity and often results in emotional eating behaviors. We will monitor this closely and work on CBT to help improve the non-hunger eating patterns. Referral to Psychology may be required if no improvement is seen as she continues in our clinic.  9. Class 1 obesity with serious comorbidity and body mass index (BMI) of 30.0 to 30.9 in adult, unspecified obesity type Jillian Escobar is currently in the action stage of change and her goal is to continue with weight loss efforts. I recommend Jillian Escobar begin the structured treatment plan as follows:  She has agreed to the Category 1 Plan.  Exercise goals:  As is.      Behavioral modification strategies: increasing lean protein intake, meal planning and cooking strategies, keeping healthy foods in the home, and planning for success.  She was informed of the importance of frequent follow-up visits to maximize her success with intensive lifestyle modifications for her multiple health conditions. She was informed we would discuss her lab results at her next visit unless there is a critical issue that needs to be addressed sooner. [place] agreed to keep her next visit at the agreed upon time to discuss these results.  Objective:   Blood pressure 129/75, pulse 73, temperature 98.1 F (36.7 C), height 5\' 3"  (1.6 m), weight 170 lb (77.1 kg), SpO2 99 %. Body mass index is 30.11 kg/m.  EKG: Normal sinus rhythm, rate 59 bpm.  Indirect Calorimeter completed today shows a VO2 of 174 and a REE of 1195.  Her calculated basal metabolic rate is 4970 thus her basal metabolic rate is worse than expected.  General: Cooperative, alert, well developed, in no acute distress. HEENT: Conjunctivae and lids unremarkable. Cardiovascular: Regular rhythm.  Lungs: Normal work of breathing. Neurologic: No focal deficits.   Lab Results  Component Value Date   CREATININE 0.82 03/18/2022   BUN 14 03/18/2022   NA 140  03/18/2022   K 4.7 03/18/2022   CL 102 03/18/2022   CO2 25 03/18/2022   Lab Results  Component Value Date   ALT 16 03/18/2022   AST 21 03/18/2022   ALKPHOS 84 03/18/2022   BILITOT 1.0 03/18/2022   Lab Results  Component Value Date   HGBA1C 5.5 03/18/2022   HGBA1C 5.4 07/26/2018   HGBA1C 5.3 02/07/2018   Lab Results  Component Value Date   INSULIN 14.5 03/18/2022   Lab Results  Component Value Date   TSH 3.780 03/18/2022   Lab Results  Component Value Date   CHOL 152 03/18/2022   HDL 60 03/18/2022   LDLCALC 77 03/18/2022   LDLDIRECT 158.4 08/14/2012   TRIG 77 03/18/2022   CHOLHDL 2.8 07/14/2021   Lab Results  Component Value Date   WBC 4.1  03/18/2022   HGB 14.8 03/18/2022   HCT 43.3 03/18/2022   MCV 91 03/18/2022   PLT 300 03/18/2022   No results found for: "IRON", "TIBC", "FERRITIN"  Attestation Statements:   Reviewed by clinician on day of visit: allergies, medications, problem list, medical history, surgical history, family history, social history, and previous encounter notes.  Time spent on visit including pre-visit chart review and post-visit charting and care was 40 minutes.   I, Davy Pique, RMA, am acting as transcriptionist for Coralie Common, MD.  This is the patient's first visit at [facility]. The patient's NEW PATIENT PACKET was reviewed at length. Included in the packet: current and past health history, medications, allergies, ROS, gynecologic history (women only), surgical history, family history, social history, weight history, weight loss surgery history (for those that have had weight loss surgery), nutritional evaluation, mood and food questionnaire, PHQ9, Epworth questionnaire, sleep habits questionnaire, patient life and health improvement goals questionnaire. These will all be scanned into the patient's chart under media.   During the visit, I independently reviewed the patient's EKG, bioimpedance scale results, and indirect calorimeter results. I used this information to tailor a meal plan for the patient that will help her to lose weight and will improve her obesity-related conditions going forward. I performed a medically necessary appropriate examination and/or evaluation. I discussed the assessment and treatment plan with the patient. The patient was provided an opportunity to ask questions and all were answered. The patient agreed with the plan and demonstrated an understanding of the instructions. Labs were ordered at this visit and will be reviewed at the next visit unless more critical results need to be addressed immediately. Clinical information was updated and documented in the  EMR.   I have reviewed the above documentation for accuracy and completeness, and I agree with the above. Coralie Common, MD  a

## 2022-04-01 ENCOUNTER — Ambulatory Visit (INDEPENDENT_AMBULATORY_CARE_PROVIDER_SITE_OTHER): Payer: Medicare Other | Admitting: Family Medicine

## 2022-04-01 ENCOUNTER — Encounter (INDEPENDENT_AMBULATORY_CARE_PROVIDER_SITE_OTHER): Payer: Self-pay | Admitting: Family Medicine

## 2022-04-01 ENCOUNTER — Ambulatory Visit (HOSPITAL_BASED_OUTPATIENT_CLINIC_OR_DEPARTMENT_OTHER)
Admission: RE | Admit: 2022-04-01 | Discharge: 2022-04-01 | Disposition: A | Discharge status: Home / Self Care | Payer: Medicare Other | Source: Ambulatory Visit | Attending: Student | Admitting: Student

## 2022-04-01 VITALS — BP 123/69 | HR 69 | Temp 98.3°F | Ht 63.0 in | Wt 169.0 lb

## 2022-04-01 DIAGNOSIS — E88819 Insulin resistance, unspecified: Secondary | ICD-10-CM | POA: Diagnosis not present

## 2022-04-01 DIAGNOSIS — E785 Hyperlipidemia, unspecified: Secondary | ICD-10-CM

## 2022-04-01 DIAGNOSIS — E559 Vitamin D deficiency, unspecified: Secondary | ICD-10-CM

## 2022-04-01 DIAGNOSIS — E669 Obesity, unspecified: Secondary | ICD-10-CM

## 2022-04-01 DIAGNOSIS — E7849 Other hyperlipidemia: Secondary | ICD-10-CM

## 2022-04-01 DIAGNOSIS — Z6830 Body mass index (BMI) 30.0-30.9, adult: Secondary | ICD-10-CM

## 2022-04-01 DIAGNOSIS — Z8249 Family history of ischemic heart disease and other diseases of the circulatory system: Secondary | ICD-10-CM

## 2022-04-01 NOTE — Progress Notes (Signed)
Chief Complaint:   OBESITY Jillian Escobar is here to discuss her progress with her obesity treatment plan along with follow-up of her obesity related diagnoses. Jillian Escobar is on the Category 1 Plan and states she is following her eating plan approximately 25% of the time. Jillian Escobar states she is walking 20,000 steps 7-day for 1 week.  Today's visit was #: 2 Starting weight: 170 LBS Starting date: 03/18/2022 Today's weight: 169 LBS Today's date: 04/01/2022 Total lbs lost to date: 1 LB Total lbs lost since last in-office visit: 1 LB  Interim History: Patient went on a trip between last appointment and today to [place].  She voices she did a significant amount of walking while away and really enjoyed herself.  Over the next few weeks she has furniture market but no other events or activities. She started the meal plan 2 days ago and has been starting to get more on track with what is on Cat 1.  Wants to commit to meal plan more consistently over the next few weeks.   Subjective:   1. Other hyperlipidemia Patient is on Lipitor daily.  No side effects noted, no transaminitis or myalgias.  2. Vitamin D deficiency Patient is OTC vitamin D.  Last vitamin D level was 58.  3. Insulin resistance Patient A1c 5.5, insulin 14.5.  Patient is positive for carb cravings.  Assessment/Plan:   1. Other hyperlipidemia Continue Lipitor.  2. Vitamin D deficiency Vitamin D at goal.  Continue OTC vitamin D.  3. Insulin resistance Patient to start category 1 meal plan, may want to revisit medication if cravings increase or hunger increases.  4. Obesity with current BMI of 30.0 Jillian Escobar is currently in the action stage of change. As such, her goal is to continue with weight loss efforts. She has agreed to the Category 1 Plan.   Exercise goals: All adults should avoid inactivity. Some physical activity is better than none, and adults who participate in any amount of physical activity gain some health  benefits.  Behavioral modification strategies: increasing lean protein intake, meal planning and cooking strategies, keeping healthy foods in the home, and planning for success.  Jillian Escobar has agreed to follow-up with our clinic in 2 weeks. She was informed of the importance of frequent follow-up visits to maximize her success with intensive lifestyle modifications for her multiple health conditions.   Objective:   Blood pressure 123/69, pulse 69, temperature 98.3 F (36.8 C), height 5\' 3"  (1.6 m), weight 169 lb (76.7 kg), SpO2 100 %. Body mass index is 29.94 kg/m.  General: Cooperative, alert, well developed, in no acute distress. HEENT: Conjunctivae and lids unremarkable. Cardiovascular: Regular rhythm.  Lungs: Normal work of breathing. Neurologic: No focal deficits.   Lab Results  Component Value Date   CREATININE 0.82 03/18/2022   BUN 14 03/18/2022   NA 140 03/18/2022   K 4.7 03/18/2022   CL 102 03/18/2022   CO2 25 03/18/2022   Lab Results  Component Value Date   ALT 16 03/18/2022   AST 21 03/18/2022   ALKPHOS 84 03/18/2022   BILITOT 1.0 03/18/2022   Lab Results  Component Value Date   HGBA1C 5.5 03/18/2022   HGBA1C 5.4 07/26/2018   HGBA1C 5.3 02/07/2018   Lab Results  Component Value Date   INSULIN 14.5 03/18/2022   Lab Results  Component Value Date   TSH 3.780 03/18/2022   Lab Results  Component Value Date   CHOL 152 03/18/2022   HDL 60 03/18/2022  LDLCALC 77 03/18/2022   LDLDIRECT 158.4 08/14/2012   TRIG 77 03/18/2022   CHOLHDL 2.8 07/14/2021   Lab Results  Component Value Date   VD25OH 58.7 03/18/2022   Lab Results  Component Value Date   WBC 4.1 03/18/2022   HGB 14.8 03/18/2022   HCT 43.3 03/18/2022   MCV 91 03/18/2022   PLT 300 03/18/2022   No results found for: "IRON", "TIBC", "FERRITIN"  Attestation Statements:   Reviewed by clinician on day of visit: allergies, medications, problem list, medical history, surgical history, family  history, social history, and previous encounter notes.  Time spent on visit including pre-visit chart review and post-visit care and charting was 42 minutes.   I, Malcolm Metro, RMA, am acting as transcriptionist for Reuben Likes, MD. I have reviewed the above documentation for accuracy and completeness, and I agree with the above. - Reuben Likes, MD

## 2022-04-12 ENCOUNTER — Ambulatory Visit (INDEPENDENT_AMBULATORY_CARE_PROVIDER_SITE_OTHER): Payer: Medicare Other | Admitting: Family Medicine

## 2022-04-12 ENCOUNTER — Encounter (INDEPENDENT_AMBULATORY_CARE_PROVIDER_SITE_OTHER): Payer: Self-pay | Admitting: Family Medicine

## 2022-04-12 VITALS — BP 112/65 | HR 68 | Temp 98.1°F | Ht 63.0 in

## 2022-04-12 DIAGNOSIS — E669 Obesity, unspecified: Secondary | ICD-10-CM

## 2022-04-12 DIAGNOSIS — E88819 Insulin resistance, unspecified: Secondary | ICD-10-CM

## 2022-04-12 DIAGNOSIS — Z6830 Body mass index (BMI) 30.0-30.9, adult: Secondary | ICD-10-CM

## 2022-04-12 DIAGNOSIS — I10 Essential (primary) hypertension: Secondary | ICD-10-CM | POA: Diagnosis not present

## 2022-04-12 DIAGNOSIS — E559 Vitamin D deficiency, unspecified: Secondary | ICD-10-CM | POA: Diagnosis not present

## 2022-04-12 NOTE — Progress Notes (Signed)
Jillian Escobar, D.O.  ABFM, ABOM Specializing in Clinical Bariatric Medicine  Office located at: [address]  [place], [place]  [id number]     Assessment and Plan:   No orders of the defined types were placed in this encounter.   There are no discontinued medications.   No orders of the defined types were placed in this encounter.   Essential hypertension Assessment: Condition is stable.  Last 3 blood pressure readings in our office are as follows: BP Readings from Last 3 Encounters:  04/12/22 112/65  04/01/22 123/69  03/18/22 129/75  Patient has been compliant with  HCTZ 12.5 mg daily. She endorses feeling dizzy and light headed on rare occasions. She was told to take half the Cozaar per her pcp/specialist, which she has not done yet due to small size of pill. She has not been monitoring her bp at home lately.   Plan:Continue with meds as recommended by PCP/specialist.  - Avoid buying foods that are: processed, frozen, or prepackaged to avoid excess salt. - Ambulatory blood pressure monitoring encouraged.  Reminded patient that if they ever feel poorly in any way, to check their blood pressure and pulse as well. - We will continue to monitor closely alongside PCP/ specialists.  Pt reminded to also f/up with those individuals as instructed by them.  - We will continue to monitor symptoms as they relate to the her weight loss journey.    Insulin resistance Assessment: Condition is stable. Lab Results  Component Value Date   HGBA1C 5.5 03/18/2022   HGBA1C 5.4 07/26/2018   HGBA1C 5.3 02/07/2018   INSULIN 14.5 03/18/2022  Patient is not on any medication for the Insulin Resistance. This is diet controlled. She endorses having cravings for sweets like chocolate.   Plan:Continue her prudent nutritional plan and continue to advance exercise and cardiovascular fitness as tolerated.  We discussed the importance of decreasing simple carbs and increasing lean protein  intake.    Vitamin D deficiency Assessment: Condition is stable. Lab Results  Component Value Date   VD25OH 58.7 03/18/2022  Patient is compliant with OTC Vitamin PO 2 tablets daily  Plan: Continue with the OTC med.  - weight loss will likely improve availability of vitamin D, thus encouraged Jillian Escobar to continue with meal plan and their weight loss efforts to further improve this condition.  Thus, we will need to monitor levels regularly (every 3-4 mo on average) to keep levels within normal limits and prevent over supplementation..   TREATMENT PLAN FOR OBESITY: Obesity with current BMI of 30.0-(start bmi 30.11/date 03/18/22) Assessment: Condition is Not optimized.. Biometric data collected today, was reviewed with patient.  Fat mass has increased by 1.8lb. Muscle mass has decreased by 1.4lb. Total body water has increased by 1.8lb. She endorses that she has not been getting enough protein and does not know how to hit her daily protein goals. She drinks 2 beers and/or 2 shots of bourbon frequently.  Plan: Continue with the Category 1 meal plan  I reviewed the Category 1 meal plan extensively with her to show her how to hit her daily protein and calorie goals. Counseling was also done on :  Decreasing consumption of liquid calories. The importance of increasing protein intake and eating foods with a 10:1 calorie to protein ratio.  Continuing to journal for more mindful eating.  Behavioral Intervention Additional resources provided today: category 1 meal plan information, food journaling log Evidence-based interventions for health behavior change were utilized today including  the discussion of self monitoring techniques, problem-solving barriers and SMART goal setting techniques.   Regarding patient's less desirable eating habits and patterns, we employed the technique of small changes.  Pt will specifically work on: increasing lean protein intake and journaling for next visit.     Recommended Physical Activity Goals She has agreed to Continue current level of physical activity   FOLLOW UP: Return in about 2 weeks (around 04/26/2022). She was informed of the importance of frequent follow up visits to maximize her success with intensive lifestyle modifications for her multiple health conditions.  Subjective:   Chief complaint: Obesity Jillian Escobar is here to discuss her progress with her obesity treatment plan. She is on the the Category 1 Plan and states she is following her eating plan approximately 95% of the time. She states she is exercising 20-45 minutes 3-7 days per week.  Interval History:  Jillian Escobar is here for a follow up office visit.We reviewed her meal plan and all questions were answered. Patient's food recall appears to be accurate and consistent with what is on plan when she is following it. When eating on plan, her hunger and cravings are well controlled.     Review of Systems:  Pertinent positives were addressed with patient today.  Weight Summary and Biometrics   Weight Lost Since Last Visit: 0  Weight Gained Since Last Visit: 0   Vitals Temp: 98.1 F (36.7 C) BP: 112/65 Pulse Rate: 68 SpO2: 95 %   Anthropometric Measurements Height: 5\' 3"  (1.6 m) Weight at Last Visit: 169lb Weight Lost Since Last Visit: 0 Weight Gained Since Last Visit: 0 Starting Weight: 170lb Total Weight Loss (lbs): 1 lb (0.454 kg) Peak Weight: 197lb   Body Composition  Body Fat %: 41 % Fat Mass (lbs): 69.6 lbs Muscle Mass (lbs): 95 lbs Total Body Water (lbs): 70 lbs Visceral Fat Rating : 11   Other Clinical Data Fasting: no Labs: no Today's Visit #: 3 Starting Date: 03/18/22    Objective:   PHYSICAL EXAM:  Blood pressure 112/65, pulse 68, temperature 98.1 F (36.7 C), height 5\' 3"  (1.6 m), SpO2 95 %. Body mass index is 29.94 kg/m.  General: Well Developed, well nourished, and in no acute distress.  HEENT: Normocephalic,  atraumatic Skin: Warm and dry, cap RF less 2 sec, good turgor Chest:  Normal excursion, shape, no gross abn Respiratory: speaking in full sentences, no conversational dyspnea NeuroM-Sk: Ambulates w/o assistance, moves * 4 Psych: A and O *3, insight good, mood-full  DIAGNOSTIC DATA REVIEWED:  BMET    Component Value Date/Time   NA 140 03/18/2022 0847   K 4.7 03/18/2022 0847   CL 102 03/18/2022 0847   CO2 25 03/18/2022 0847   GLUCOSE 104 (H) 03/18/2022 0847   GLUCOSE 87 11/09/2021 1213   BUN 14 03/18/2022 0847   CREATININE 0.82 03/18/2022 0847   CALCIUM 10.4 (H) 03/18/2022 0847   GFRNONAA 63 04/04/2019 1234   GFRAA 73 04/04/2019 1234   Lab Results  Component Value Date   HGBA1C 5.5 03/18/2022   HGBA1C 5.3 02/07/2018   Lab Results  Component Value Date   INSULIN 14.5 03/18/2022   Lab Results  Component Value Date   TSH 3.780 03/18/2022   CBC    Component Value Date/Time   WBC 4.1 03/18/2022 0847   WBC 4.8 02/25/2021 1126   RBC 4.74 03/18/2022 0847   RBC 4.66 02/25/2021 1126   HGB 14.8 03/18/2022 0847   HCT 43.3 03/18/2022  0847   PLT 300 03/18/2022 0847   MCV 91 03/18/2022 0847   MCH 31.2 03/18/2022 0847   MCH 31.0 08/21/2019 0915   MCHC 34.2 03/18/2022 0847   MCHC 34.6 02/25/2021 1126   RDW 12.6 03/18/2022 0847   Iron Studies No results found for: "IRON", "TIBC", "FERRITIN", "IRONPCTSAT" Lipid Panel     Component Value Date/Time   CHOL 152 03/18/2022 0847   TRIG 77 03/18/2022 0847   HDL 60 03/18/2022 0847   CHOLHDL 2.8 07/14/2021 0837   CHOLHDL 4.1 08/21/2019 0915   VLDL 18.4 07/26/2018 0803   LDLCALC 77 03/18/2022 0847   LDLCALC 133 (H) 08/21/2019 0915   LDLDIRECT 158.4 08/14/2012 0828   Hepatic Function Panel     Component Value Date/Time   PROT 6.9 03/18/2022 0847   ALBUMIN 4.5 03/18/2022 0847   AST 21 03/18/2022 0847   ALT 16 03/18/2022 0847   ALKPHOS 84 03/18/2022 0847   BILITOT 1.0 03/18/2022 0847   BILIDIR 0.25 03/10/2020 0935   IBILI  0.8 08/21/2019 0915      Component Value Date/Time   TSH 3.780 03/18/2022 0847   Nutritional Lab Results  Component Value Date   VD25OH 58.7 03/18/2022    Attestations:   Reviewed by clinician on day of visit: allergies, medications, problem list, medical history, surgical history, family history, social history, and previous encounter notes.   Patient was in the office today and time spent on visit including pre-visit chart review and post-visit care/coordination of care and electronic medical record documentation was 40 minutes. 50% of the time was in face to face counseling of this patient's medical condition(s) and providing education on treatment options to include the first-line treatment of diet and lifestyle modification.   I,Special Puri,acting as a Neurosurgeon for [employer], DO.,have documented all relevant documentation on the behalf of Thomasene Lot, DO,as directed by  Thomasene Lot, DO while in the presence of Thomasene Lot, DO.   I, Thomasene Lot, DO, have reviewed all documentation for this visit. The documentation on 04/12/22 for the exam, diagnosis, procedures, and orders are all accurate and complete.

## 2022-04-13 ENCOUNTER — Other Ambulatory Visit: Payer: Self-pay

## 2022-04-13 ENCOUNTER — Ambulatory Visit (INDEPENDENT_AMBULATORY_CARE_PROVIDER_SITE_OTHER): Payer: Self-pay | Admitting: Family Medicine

## 2022-04-13 ENCOUNTER — Encounter (HOSPITAL_BASED_OUTPATIENT_CLINIC_OR_DEPARTMENT_OTHER): Payer: Self-pay | Admitting: Emergency Medicine

## 2022-04-13 ENCOUNTER — Emergency Department (HOSPITAL_BASED_OUTPATIENT_CLINIC_OR_DEPARTMENT_OTHER): Payer: Medicare Other

## 2022-04-13 ENCOUNTER — Emergency Department (HOSPITAL_BASED_OUTPATIENT_CLINIC_OR_DEPARTMENT_OTHER)
Admission: EM | Admit: 2022-04-13 | Discharge: 2022-04-13 | Disposition: A | Discharge status: Home / Self Care | Payer: Medicare Other | Attending: Emergency Medicine | Admitting: Emergency Medicine

## 2022-04-13 DIAGNOSIS — R109 Unspecified abdominal pain: Secondary | ICD-10-CM | POA: Diagnosis present

## 2022-04-13 DIAGNOSIS — N200 Calculus of kidney: Secondary | ICD-10-CM

## 2022-04-13 DIAGNOSIS — N132 Hydronephrosis with renal and ureteral calculous obstruction: Secondary | ICD-10-CM | POA: Diagnosis not present

## 2022-04-13 LAB — CBC WITH DIFFERENTIAL/PLATELET
Abs Immature Granulocytes: 0.01 10*3/uL (ref 0.00–0.07)
Basophils Absolute: 0.1 10*3/uL (ref 0.0–0.1)
Basophils Relative: 1 %
Eosinophils Absolute: 0.3 10*3/uL (ref 0.0–0.5)
Eosinophils Relative: 5 %
HCT: 43.3 % (ref 36.0–46.0)
Hemoglobin: 15 g/dL (ref 12.0–15.0)
Immature Granulocytes: 0 %
Lymphocytes Relative: 40 %
Lymphs Abs: 2.1 10*3/uL (ref 0.7–4.0)
MCH: 30.5 pg (ref 26.0–34.0)
MCHC: 34.6 g/dL (ref 30.0–36.0)
MCV: 88 fL (ref 80.0–100.0)
Monocytes Absolute: 0.5 10*3/uL (ref 0.1–1.0)
Monocytes Relative: 10 %
Neutro Abs: 2.3 10*3/uL (ref 1.7–7.7)
Neutrophils Relative %: 44 %
Platelets: 333 10*3/uL (ref 150–400)
RBC: 4.92 MIL/uL (ref 3.87–5.11)
RDW: 12.9 % (ref 11.5–15.5)
WBC: 5.3 10*3/uL (ref 4.0–10.5)
nRBC: 0 % (ref 0.0–0.2)

## 2022-04-13 LAB — BASIC METABOLIC PANEL
Anion gap: 9 (ref 5–15)
BUN: 20 mg/dL (ref 8–23)
CO2: 25 mmol/L (ref 22–32)
Calcium: 9.9 mg/dL (ref 8.9–10.3)
Chloride: 105 mmol/L (ref 98–111)
Creatinine, Ser: 0.84 mg/dL (ref 0.44–1.00)
GFR, Estimated: >60 mL/min (ref >60)
Glucose, Bld: 114 mg/dL — ABNORMAL HIGH (ref 70–99)
Potassium: 3.6 mmol/L (ref 3.5–5.1)
Sodium: 139 mmol/L (ref 135–145)

## 2022-04-13 MED ORDER — KETOROLAC TROMETHAMINE 30 MG/ML IJ SOLN
30.0000 mg | Freq: Once | INTRAMUSCULAR | Status: AC
  Administered 2022-04-13: 30 mg via INTRAVENOUS
  Filled 2022-04-13: qty 1

## 2022-04-13 MED ORDER — MORPHINE SULFATE (PF) 4 MG/ML IV SOLN
4.0000 mg | Freq: Once | INTRAVENOUS | Status: AC
  Administered 2022-04-13: 4 mg via INTRAVENOUS
  Filled 2022-04-13: qty 1

## 2022-04-13 MED ORDER — ONDANSETRON HCL 4 MG/2ML IJ SOLN
4.0000 mg | Freq: Once | INTRAMUSCULAR | Status: AC
  Administered 2022-04-13: 4 mg via INTRAVENOUS
  Filled 2022-04-13: qty 2

## 2022-04-13 MED ORDER — OXYCODONE-ACETAMINOPHEN 5-325 MG PO TABS: 1.0000 | ORAL_TABLET | Freq: Four times a day (QID) | ORAL | 0 refills | Status: DC | PRN

## 2022-04-13 NOTE — Discharge Instructions (Signed)
Begin taking Percocet as prescribed as needed for pain.  Follow-up with urology if your symptoms have not resolved in the next 3 to 4 days.  The contact information for [facility] has been provided in this discharge summary for you to call and make these arrangements.

## 2022-04-13 NOTE — ED Triage Notes (Signed)
L flank pain that woke her from sleep at 3am this morning. Endorses subjective fever, dysuria, frequency. No h/o kidney stones.

## 2022-04-13 NOTE — ED Provider Notes (Signed)
[facility]PART[facility] AT [facility] Provider Note   CSN: [id number] Arrival date & time: 04/13/22  5686     History  Chief Complaint  Patient presents with   Flank Pain    Jillian Escobar is a 72 y.o. female.  Patient is a 72 year old female presenting with left flank pain.  This woke her from sleep at approximately 3 AM.  She went to bed feeling well with no symptoms, then awoke with this pain and the urge to urinate.  She denies any fevers or chills.  She feels nauseated, but has not vomited.  No bowel complaints.  Pain worse with movement and palpation with no alleviating factors.  The history is provided by the patient.       Home Medications Prior to Admission medications   Medication Sig Start Date End Date Taking? Authorizing Provider  albuterol (VENTOLIN HFA) 108 (90 Base) MCG/ACT inhaler TAKE 2 PUFFS BY MOUTH EVERY 6 HOURS AS NEEDED FOR WHEEZE OR SHORTNESS OF BREATH 10/22/19   Panosh, Neta Mends, MD  atorvastatin (LIPITOR) 20 MG tablet TAKE 1 TABLET BY MOUTH EVERY DAY 08/11/21   Marykay Lex, MD  B Complex Vitamins (B-COMPLEX/B-12 PO) Take by mouth. Unknown dosage    [provider]  calcium carbonate (OS-CAL) 600 MG TABS tablet     [provider]  celecoxib (CELEBREX) 200 MG capsule Take 1 capsule (200 mg total) by mouth daily. 02/11/21   Judi Saa, DO  Cholecalciferol (VITAMIN D PO) Take 2 tablets by mouth daily.    [provider]  ELDERBERRY PO daily.     [provider]  famotidine (PEPCID) 20 MG tablet Take 20 mg by mouth as needed for heartburn or indigestion.    [provider]  hydrochlorothiazide (MICROZIDE) 12.5 MG capsule TAKE 1 CAPSULE BY MOUTH EVERY DAY 07/01/21   Marykay Lex, MD  Ibuprofen-Famotidine (DUEXIS) 800-26.6 MG TABS Take 1 tablet by mouth daily as needed (do not take on day you take celebrex). 05/08/21   Shelva Majestic, MD  KRILL OIL PO Take by mouth in the morning and at  bedtime.    [provider]  levothyroxine (SYNTHROID) 50 MCG tablet TAKE 1 TABLET BY MOUTH EVERY DAY 12/29/21   Shelva Majestic, MD  losartan (COZAAR) 25 MG tablet TAKE 1 TABLET (25 MG TOTAL) BY MOUTH DAILY. 09/30/21   Panosh, Neta Mends, MD  Misc Natural Products (OSTEO BI-FLEX/5-LOXIN ADVANCED PO) Take by mouth 2 (two) times daily at 8 am and 10 pm.    [provider]  Multiple Vitamin (MULTIVITAMIN WITH MINERALS) TABS tablet Take 1 tablet by mouth daily.     [provider]  OVER THE COUNTER MEDICATION Take 1 capsule by mouth daily. CBD oil 2500 mg    [provider]  TART CHERRY PO Take 1 tablet by mouth daily.    [provider]  Turmeric 500 MG TABS Take 1 tablet by mouth 2 (two) times daily.    [provider]  vitamin A 3 MG (10000 UNITS) capsule Take 10,000 Units by mouth daily.    [provider]      Allergies    Levaquin [levofloxacin]    Review of Systems   Review of Systems  All other systems reviewed and are negative.   Physical Exam Updated Vital Signs BP (!) 178/85   Pulse 72   Temp 97.9 F (36.6 C) (Oral)   Resp 18  Wt 76 kg   LMP  (LMP Unknown)   SpO2 96%   BMI 29.68 kg/m  Physical Exam Vitals and nursing note reviewed.  Constitutional:      General: She is not in acute distress.    Appearance: She is well-developed. She is not diaphoretic.  HENT:     Head: Normocephalic and atraumatic.  Cardiovascular:     Rate and Rhythm: Normal rate and regular rhythm.     Heart sounds: No murmur heard.    No friction rub. No gallop.  Pulmonary:     Effort: Pulmonary effort is normal. No respiratory distress.     Breath sounds: Normal breath sounds. No wheezing.  Abdominal:     General: Bowel sounds are normal. There is no distension.     Palpations: Abdomen is soft.     Tenderness: There is no abdominal tenderness. There is left CVA tenderness. There is no right CVA tenderness, guarding or rebound.   Musculoskeletal:        General: Normal range of motion.     Cervical back: Normal range of motion and neck supple.  Skin:    General: Skin is warm and dry.  Neurological:     General: No focal deficit present.     Mental Status: She is alert and oriented to person, place, and time.     ED Results / Procedures / Treatments   Labs (all labs ordered are listed, but only abnormal results are displayed) Labs Reviewed - No data to display  EKG None  Radiology No results found.  Procedures Procedures    Medications Ordered in ED Medications  ondansetron (ZOFRAN) injection 4 mg (has no administration in time range)  morphine (PF) 4 MG/ML injection 4 mg (has no administration in time range)  ketorolac (TORADOL) 30 MG/ML injection 30 mg (has no administration in time range)    ED Course/ Medical Decision Making/ A&P  Patient presenting here with complaints of left flank pain as described in the HPI.  This woke her from sleep at approximately 3 AM.  Patient arrives here uncomfortable, but with stable vital signs.  She does have some left-sided CVA tenderness, but abdomen is benign.  Workup initiated including CBC, metabolic panel, and urinalysis.  There is no leukocytosis and laboratory studies are unremarkable.  Urinalysis currently pending.  CT with renal protocol obtained showing a 3 mm stone at the left UVJ with hydronephrosis.  No other emergent pathology identified.  Patient was given IV Toradol, morphine, and Zofran and symptoms have markedly improved.  When urinalysis returns, anticipate discharge with as needed follow-up with urology if the stone has not passed in the next few days.  Final Clinical Impression(s) / ED Diagnoses Final diagnoses:  None    Rx / DC Orders ED Discharge Orders     None         Geoffery Lyons, MD 04/13/22 [phone]

## 2022-04-13 NOTE — ED Notes (Signed)
Attempted one more time and pt was unable to void. MD aware.

## 2022-04-13 NOTE — ED Notes (Signed)
Pt given discharge instructions and reviewed prescriptions. Opportunities given for questions. Pt verbalizes understanding. PIV removed x1. Aleysia Oltmann R, RN 

## 2022-04-15 ENCOUNTER — Telehealth: Payer: Self-pay

## 2022-04-15 NOTE — Telephone Encounter (Signed)
        Patient  visited [facility] on 04/13/2022  for flank  pain.   Telephone encounter attempt :  1st  A HIPAA compliant voice message was left requesting a return call.  Instructed patient to call back at [phone].   Elizebeth Kluesner [facility]  [facility] Population Health Community Resource Care Guide   [email or web address]  ?? [phone]   Website: [email or web address]  [email or web address]

## 2022-04-19 ENCOUNTER — Telehealth: Payer: Self-pay

## 2022-04-19 NOTE — Telephone Encounter (Signed)
        Patient  visited [facility] on 04/13/2022  for flank pain.   Telephone encounter attempt :  2nd  A HIPAA compliant voice message was left requesting a return call.  Instructed patient to call back at [phone].   Deshante Cassell [facility]  [facility] Population Health Community Resource Care Guide   [email or web address]  ?? [phone]   Website: [email or web address]  [email or web address]

## 2022-04-21 NOTE — Progress Notes (Deleted)
[name] [address] [place] [address] Phone: [phone] Subjective:    I'm seeing this patient by the request  of:  Jillian Majestic, MD  CC:   [id number]  03/04/2022 Chronic problem with worsening symptoms, injection given today, will be traveling and doing a lot of walking so hopefully this will be significantly beneficial. Worsening pain will consider prednisone for her trip. Follow-up again in 6 to 8 weeks   Update 04/22/2022 Jillian Escobar is a 72 y.o. female coming in with complaint of R ankle pain. Also gets OMT. Patient states        Past Medical History:  Diagnosis Date   Allergy    Aortic valve insufficiency    Arthritis    GERD (gastroesophageal reflux disease)    Hx of varicella    Hyperlipidemia    borderline   Hypertension    Hypothyroidism    Pneumonia 11/2014   Only time took inhalers to treat   RBBB 10/2019   recently seen at [facility] - f/u 6 mons, no treatment   Sacral back pain    Shingles 10/26/2010   Syncope and collapse 10/2019   Unclear etiology - just saw MD - f/u 6 months - no treatment   Thyroid disease    Past Surgical History:  Procedure Laterality Date   CESAREAN SECTION     x1   CHOLECYSTECTOMY     COLONOSCOPY  2011   tics, no polyps - recall 5 yr   ESOPHAGOGASTRODUODENOSCOPY (EGD) WITH PROPOFOL N/A 12/09/2015   Procedure: ESOPHAGOGASTRODUODENOSCOPY (EGD) WITH PROPOFOL;  Surgeon: Ruffin Frederick, MD;  Location: [facility];  Service: Gastroenterology;  Laterality: N/A;   EXPLORATORY LAPAROTOMY     Gallbladder   POLYPECTOMY N/A 12/09/2015   Procedure: POLYPECTOMY;  Surgeon: Ruffin Frederick, MD;  Location: [facility];  Service: Gastroenterology;  Laterality: N/A;   TRANSTHORACIC ECHOCARDIOGRAM  10/07/2020   EF 60 to 65%.  Mild concentric LVH.  GRII DD.  Mild to moderate AI.  Normal PAP.  Normal RAP.  Normal RAP.  Stable AI   TUBAL LIGATION     WISDOM TOOTH EXTRACTION      Social History   Socioeconomic History   Marital status: Married    Spouse name: Jillian Escobar   Number of children: 1   Years of education: Not on file   Highest education level: Associate degree: academic program  Occupational History   Occupation: Retired  Tobacco Use   Smoking status: Former    Types: Cigarettes    Quit date: 01/04/1982    Years since quitting: 40.3   Smokeless tobacco: Never   Tobacco comments:    Marijuana daily  Vaping Use   Vaping Use: Former  Substance and Sexual Activity   Alcohol use: Yes    Alcohol/week: 14.0 standard drinks of alcohol    Types: 14 Standard drinks or equivalent per week    Comment: daily- 2 a night    Drug use: Yes    Types: Marijuana    Comment: Last use 10/15/19- Marijuana Daily   Sexual activity: Yes    Birth control/protection: Post-menopausal  Other Topics Concern   Not on file  Social History Narrative   Married, daughter in [place]- no grandkids- has grandpups   - no pets but dog sits   - Originally from [place], moved over 20 years ago      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  [place]      Hobbies: traveling, [place], dogsitting   Social Determinants of Health   Financial Resource Strain: Low Risk  (05/04/2021)   Overall Financial Resource Strain (CARDIA)    Difficulty of Paying Living Expenses: Not hard at all  Food Insecurity: No Food Insecurity (05/04/2021)   Hunger Vital Sign    Worried About Running Out of Food in the Last Year: Never true    Ran Out of Food in the Last Year: Never true  Transportation Needs: No Transportation Needs (05/04/2021)   PRAPARE - Administrator, Civil Service (Medical): No    Lack of Transportation (Non-Medical): No  Physical Activity: Sufficiently Active (05/04/2021)   Exercise Vital Sign    Days of Exercise per Week: 7 days    Minutes of Exercise per Session: 30 min  Stress: No Stress Concern Present (05/04/2021)   [employer] of Occupational  Health - Occupational Stress Questionnaire    Feeling of Stress : Not at all  Social Connections: Moderately Integrated (02/21/2021)   Social Connection and Isolation Panel [NHANES]    Frequency of Communication with Friends and Family: Three times a week    Frequency of Social Gatherings with Friends and Family: Once a week    Attends Religious Services: Never    Database administrator or Organizations: Yes    Attends Engineer, structural: More than 4 times per year    Marital Status: Married   Allergies  Allergen Reactions   Levaquin [Levofloxacin]     See notes  September 2019  Palpitations irritability sleep issues     Family History  Problem Relation Age of Onset   Heart disease Mother        stent in 90 s   High blood pressure Mother    High Cholesterol Mother    Transient ischemic attack Mother    Thyroid disease Mother    Anxiety disorder Mother    Sleep apnea Mother    COPD Father    Heart disease Father        cabg in 19s    Glaucoma Father    Dementia Father    Ulcerative colitis Sister    Ulcerative colitis Brother    Arrhythmia Daughter    Colon cancer Cousin    Colon polyps Neg Hx    Esophageal cancer Neg Hx    Rectal cancer Neg Hx    Stomach cancer Neg Hx     Current Outpatient Medications (Endocrine & Metabolic):    levothyroxine (SYNTHROID) 50 MCG tablet, TAKE 1 TABLET BY MOUTH EVERY DAY  Current Outpatient Medications (Cardiovascular):    atorvastatin (LIPITOR) 20 MG tablet, TAKE 1 TABLET BY MOUTH EVERY DAY   hydrochlorothiazide (MICROZIDE) 12.5 MG capsule, TAKE 1 CAPSULE BY MOUTH EVERY DAY   losartan (COZAAR) 25 MG tablet, TAKE 1 TABLET (25 MG TOTAL) BY MOUTH DAILY.  Current Outpatient Medications (Respiratory):    albuterol (VENTOLIN HFA) 108 (90 Base) MCG/ACT inhaler, TAKE 2 PUFFS BY MOUTH EVERY 6 HOURS AS NEEDED FOR WHEEZE OR SHORTNESS OF BREATH  Current Outpatient Medications (Analgesics):    celecoxib (CELEBREX) 200 MG capsule, Take  1 capsule (200 mg total) by mouth daily.   Ibuprofen-Famotidine (DUEXIS) 800-26.6 MG TABS, Take 1 tablet by mouth daily as needed (do not take on day you take celebrex).   oxyCODONE-acetaminophen (PERCOCET) 5-325 MG tablet, Take 1-2 tablets by mouth every 6 (six) hours as needed.   Current Outpatient Medications (Other):  B Complex Vitamins (B-COMPLEX/B-12 PO), Take by mouth. Unknown dosage   calcium carbonate (OS-CAL) 600 MG TABS tablet,    Cholecalciferol (VITAMIN D PO), Take 2 tablets by mouth daily.   ELDERBERRY PO, daily.    famotidine (PEPCID) 20 MG tablet, Take 20 mg by mouth as needed for heartburn or indigestion.   KRILL OIL PO, Take by mouth in the morning and at bedtime.   Misc Natural Products (OSTEO BI-FLEX/5-LOXIN ADVANCED PO), Take by mouth 2 (two) times daily at 8 am and 10 pm.   Multiple Vitamin (MULTIVITAMIN WITH MINERALS) TABS tablet, Take 1 tablet by mouth daily.    OVER THE COUNTER MEDICATION, Take 1 capsule by mouth daily. CBD oil 2500 mg   TART CHERRY PO, Take 1 tablet by mouth daily.   Turmeric 500 MG TABS, Take 1 tablet by mouth 2 (two) times daily.   vitamin A 3 MG (10000 UNITS) capsule, Take 10,000 Units by mouth daily.   Reviewed prior external information including notes and imaging from  primary care provider As well as notes that were available from care everywhere and other healthcare systems.  Past medical history, social, surgical and family history all reviewed in electronic medical record.  No pertanent information unless stated regarding to the chief complaint.   Review of Systems:  No headache, visual changes, nausea, vomiting, diarrhea, constipation, dizziness, abdominal pain, skin rash, fevers, chills, night sweats, weight loss, swollen lymph nodes, body aches, joint swelling, chest pain, shortness of breath, mood changes. POSITIVE muscle aches  Objective  There were no vitals taken for this visit.   General: No apparent distress alert and  oriented x3 mood and affect normal, dressed appropriately.  HEENT: Pupils equal, extraocular movements intact  Respiratory: Patient's speak in full sentences and does not appear short of breath  Cardiovascular: No lower extremity edema, non tender, no erythema      Impression and Recommendations:

## 2022-04-22 ENCOUNTER — Ambulatory Visit: Payer: Medicare Other | Admitting: Family Medicine

## 2022-05-03 ENCOUNTER — Encounter (INDEPENDENT_AMBULATORY_CARE_PROVIDER_SITE_OTHER): Payer: Self-pay | Admitting: Family Medicine

## 2022-05-03 ENCOUNTER — Ambulatory Visit (INDEPENDENT_AMBULATORY_CARE_PROVIDER_SITE_OTHER): Payer: Medicare Other | Admitting: Family Medicine

## 2022-05-03 VITALS — BP 123/73 | HR 63 | Temp 98.4°F | Ht 63.0 in | Wt 164.0 lb

## 2022-05-03 DIAGNOSIS — I10 Essential (primary) hypertension: Secondary | ICD-10-CM | POA: Diagnosis not present

## 2022-05-03 DIAGNOSIS — E559 Vitamin D deficiency, unspecified: Secondary | ICD-10-CM

## 2022-05-03 DIAGNOSIS — E88819 Insulin resistance, unspecified: Secondary | ICD-10-CM | POA: Diagnosis not present

## 2022-05-03 DIAGNOSIS — Z6830 Body mass index (BMI) 30.0-30.9, adult: Secondary | ICD-10-CM

## 2022-05-03 DIAGNOSIS — N200 Calculus of kidney: Secondary | ICD-10-CM | POA: Diagnosis not present

## 2022-05-03 DIAGNOSIS — E669 Obesity, unspecified: Secondary | ICD-10-CM

## 2022-05-03 NOTE — Progress Notes (Signed)
[name], D.O.  ABFM, ABOM Specializing in Clinical Bariatric Medicine  Office located at: [address] [place], [place]  [address]     Assessment and Plan:   No orders of the defined types were placed in this encounter.   There are no discontinued medications.   No orders of the defined types were placed in this encounter.    Essential hypertension Assessment: Condition is Improving, but not optimized.. Labs were reviewed.  Last 3 blood pressure readings in our office are as follows: BP Readings from Last 3 Encounters:  05/03/22 123/73  04/13/22 121/65  04/12/22 112/65   The 10-year ASCVD risk score (Arnett DK, et al., 2019) is: 15.4%  Lab Results  Component Value Date   CREATININE 0.84 04/13/2022  Patient has been compliant with HCTZ 12.5 mg daily.    Plan: BP is at goal today.  Continue with meds as recommended by PCP/specialist.  - Avoid buying foods that are: processed, frozen, or prepackaged to avoid excess salt. - Ambulatory blood pressure monitoring encouraged.  Reminded patient that if they ever feel poorly in any way, to check their blood pressure and pulse as well. - We will continue to monitor closely alongside PCP/ specialists.  Pt reminded to also f/up with those individuals as instructed by them.  - We will continue to monitor symptoms as they relate to the her weight loss journey.   Insulin resistance Assessment: Condition is Improving, but not optimized.. Labs were reviewed.  Lab Results  Component Value Date   HGBA1C 5.5 03/18/2022   HGBA1C 5.4 07/26/2018   HGBA1C 5.3 02/07/2018   INSULIN 14.5 03/18/2022  Patient is not on any medication for the Insulin Resistance. This is diet controlled.    Plan:Continue her prudent nutritional plan and continue to advance exercise and cardiovascular fitness as tolerated.  We discussed the importance of decreasing simple carbs and increasing lean protein intake.    Vitamin D  deficiency Assessment: Condition is Improving, but not optimized.. Labs were reviewed.  Lab Results  Component Value Date   VD25OH 58.7 03/18/2022  Patient is compliant with OTC Vitamin PO 2 tablets daily   Plan: Continue with the OTC med.  - weight loss will likely improve availability of vitamin D, thus encouraged Darl to continue with meal plan and their weight loss efforts to further improve this condition.  Thus, we will need to monitor levels regularly (every 3-4 mo on average) to keep levels within normal limits and prevent over supplementation..   Renal Calculi Assessment: Condition is Improving, but not optimized.   Plan:Advised to strain her urine and take her fl until her next visit with her urologist.    TREATMENT PLAN FOR OBESITY: Obesity with current BMI of 30.0-(start bmi 30.11/date 03/18/22) Assessment: Condition is docourse: improving. Biometric data collected today, was reviewed with patient.  Fat mass has decreased by 5.4 lb. Muscle mass has increased by 0.4 lb. Total body water has decreased by 3.6lb.  Per her food journaling, her calories and protein intake has been under for 10 days.    Plan:  Dorann is currently in the action stage of change. As such, her goal is to continue weight management plan. Steven will work on healthier eating habits and try their best to follow the the Category 1 meal plan  best they can.   Behavioral Intervention Additional resources provided today: Food journaling plan information and high protein low calorie snacks.  Evidence-based interventions for health behavior change  were utilized today including the discussion of self monitoring techniques, problem-solving barriers and SMART goal setting techniques.   Regarding patient's less desirable eating habits and patterns, we employed the technique of small changes.  Pt will specifically work on: continue food journaling of [phone] and 80+ protein for next visit.    Recommended Physical  Activity Goals Codie has been advised to work up to 150 minutes of moderate intensity aerobic activity a week and strengthening exercises 2-3 times per week for cardiovascular health, weight loss maintenance and preservation of muscle mass.  She has agreed to Think about ways to increase physical activity  FOLLOW UP: No follow-ups on file. She was informed of the importance of frequent follow up visits to maximize her success with intensive lifestyle modifications for her multiple health conditions.   Subjective:   Chief complaint: Obesity Sulma is here to discuss her progress with her obesity treatment plan. She is on the the Category 1 Plan and states she is following her eating plan approximately 90% of the time. She states she is exercising 30 minutes of walking 7 days per week.  Interval History:  ELIANAH KARIS is here for a follow up office visit. Since last office visit she was in the ER since her last visit for a kidney stone. She reports being constantly constipated. She denies that her pain or stone being related to her meal plan. She states that she has been journaling despite her recent health issues. She states that she has some trouble reaching her protein goal. She states that she drinks 2 beers and 2 shots of bourbon during her vacation but typically has either 2 beers or 2 shoots of bourbon daily.   We reviewed her meal plan and all questions were answered. Patient's food recall appears to be accurate and consistent with what is on plan when she is following it. When eating on plan, her hunger and cravings are well controlled.     Review of Systems:  Pertinent positives were addressed with patient today.   Weight Summary and Biometrics   Weight Lost Since Last Visit: 5 lb  No data recorded   Vitals Temp: 98.4 F (36.9 C) BP: 123/73 Pulse Rate: 63 SpO2: 97 %   Anthropometric Measurements Height: 5\' 3"  (1.6 m) Weight: 164 lb (74.4 kg) BMI (Calculated):  29.06 Weight at Last Visit: 169 lb Weight Lost Since Last Visit: 5 lb Starting Weight: 170 lb Total Weight Loss (lbs): 6 lb (2.722 kg)   Body Composition  Body Fat %: 39 % Fat Mass (lbs): 64.2 lbs Muscle Mass (lbs): 95.4 lbs Total Body Water (lbs): 67.4 lbs Visceral Fat Rating : 29.2   Other Clinical Data Fasting: No Labs: No Today's Visit #: 4 Starting Date: 03/18/22     Objective:   PHYSICAL EXAM: Blood pressure 123/73, pulse 63, temperature 98.4 F (36.9 C), height 5\' 3"  (1.6 m), weight 164 lb (74.4 kg), SpO2 97 %. Body mass index is 29.05 kg/m.  General: Well Developed, well nourished, and in no acute distress.  HEENT: Normocephalic, atraumatic Skin: Warm and dry, cap RF less 2 sec, good turgor Chest:  Normal excursion, shape, no gross abn Respiratory: speaking in full sentences, no conversational dyspnea NeuroM-Sk: Ambulates w/o assistance, moves * 4 Psych: A and O *3, insight good, mood-full  DIAGNOSTIC DATA REVIEWED:  BMET    Component Value Date/Time   NA 139 04/13/2022 0543   NA 140 03/18/2022 0847   K 3.6 04/13/2022 0543  CL 105 04/13/2022 0543   CO2 25 04/13/2022 0543   GLUCOSE 114 (H) 04/13/2022 0543   BUN 20 04/13/2022 0543   BUN 14 03/18/2022 0847   CREATININE 0.84 04/13/2022 0543   CALCIUM 9.9 04/13/2022 0543   GFRNONAA >60 04/13/2022 0543   GFRAA 73 04/04/2019 1234   Lab Results  Component Value Date   HGBA1C 5.5 03/18/2022   HGBA1C 5.3 02/07/2018   Lab Results  Component Value Date   INSULIN 14.5 03/18/2022   Lab Results  Component Value Date   TSH 3.780 03/18/2022   CBC    Component Value Date/Time   WBC 5.3 04/13/2022 0543   RBC 4.92 04/13/2022 0543   HGB 15.0 04/13/2022 0543   HGB 14.8 03/18/2022 0847   HCT 43.3 04/13/2022 0543   HCT 43.3 03/18/2022 0847   PLT 333 04/13/2022 0543   PLT 300 03/18/2022 0847   MCV 88.0 04/13/2022 0543   MCV 91 03/18/2022 0847   MCH 30.5 04/13/2022 0543   MCHC 34.6 04/13/2022 0543    RDW 12.9 04/13/2022 0543   RDW 12.6 03/18/2022 0847   Iron Studies No results found for: "IRON", "TIBC", "FERRITIN", "IRONPCTSAT" Lipid Panel     Component Value Date/Time   CHOL 152 03/18/2022 0847   TRIG 77 03/18/2022 0847   HDL 60 03/18/2022 0847   CHOLHDL 2.8 07/14/2021 0837   CHOLHDL 4.1 08/21/2019 0915   VLDL 18.4 07/26/2018 0803   LDLCALC 77 03/18/2022 0847   LDLCALC 133 (H) 08/21/2019 0915   LDLDIRECT 158.4 08/14/2012 0828   Hepatic Function Panel     Component Value Date/Time   PROT 6.9 03/18/2022 0847   ALBUMIN 4.5 03/18/2022 0847   AST 21 03/18/2022 0847   ALT 16 03/18/2022 0847   ALKPHOS 84 03/18/2022 0847   BILITOT 1.0 03/18/2022 0847   BILIDIR 0.25 03/10/2020 0935   IBILI 0.8 08/21/2019 0915      Component Value Date/Time   TSH 3.780 03/18/2022 0847   Nutritional Lab Results  Component Value Date   VD25OH 58.7 03/18/2022    Attestations:   Reviewed by clinician on day of visit: allergies, medications, problem list, medical history, surgical history, family history, social history, and previous encounter notes.   Patient was in the office today and time spent on visit including pre-visit chart review and post-visit care/coordination of care and electronic medical record documentation was 30 minutes. 50% of the time was in face to face counseling of this patient's medical condition(s) and providing education on treatment options to include the first-line treatment of diet and lifestyle modification.   I,Safa M Kadhim,acting as a scribe for [employer], DO.,have documented all relevant documentation on the behalf of Thomasene Lot, DO,as directed by  Thomasene Lot, DO while in the presence of Thomasene Lot, DO.   I, Thomasene Lot, DO, have reviewed all documentation for this visit. The documentation on 05/03/22 for the exam, diagnosis, procedures, and orders are all accurate and complete.

## 2022-05-04 ENCOUNTER — Ambulatory Visit (INDEPENDENT_AMBULATORY_CARE_PROVIDER_SITE_OTHER): Payer: Self-pay | Admitting: Family Medicine

## 2022-05-10 ENCOUNTER — Ambulatory Visit (INDEPENDENT_AMBULATORY_CARE_PROVIDER_SITE_OTHER): Payer: Medicare Other | Admitting: Family Medicine

## 2022-05-10 ENCOUNTER — Encounter: Payer: Self-pay | Admitting: Family Medicine

## 2022-05-10 VITALS — BP 127/76 | HR 66 | Temp 98.2°F | Resp 16 | Ht 63.0 in | Wt 168.0 lb

## 2022-05-10 DIAGNOSIS — E785 Hyperlipidemia, unspecified: Secondary | ICD-10-CM | POA: Diagnosis not present

## 2022-05-10 DIAGNOSIS — I10 Essential (primary) hypertension: Secondary | ICD-10-CM

## 2022-05-10 DIAGNOSIS — E039 Hypothyroidism, unspecified: Secondary | ICD-10-CM | POA: Diagnosis not present

## 2022-05-10 NOTE — Patient Instructions (Addendum)
You are doing incredibly well- congrats on progress on multiple fronts!    this appears to be a subungual hematoma- with pain substantially improved- we opted to monitor- alsod could get Dr. Michaelle Copas opinion next week   Recommended follow up: Return in about 6 months (around 11/10/2022) for followup or sooner if needed.Schedule b4 you leave.

## 2022-05-10 NOTE — Progress Notes (Signed)
Phone [phone] In person visit   Subjective:   Jillian Escobar is a 72 y.o. year old very pleasant female patient who presents for/with See problem oriented charting Chief Complaint  Patient presents with   Hyperlipidemia   Hypertension   Hypothyroidism   Toe Injury    Second toe on right foot Recently did a lot of walking on a trip and toe nail is bruised and painful     Past Medical History-  Patient Active Problem List   Diagnosis Date Noted   Hyperlipidemia, unspecified 05/08/2021    Priority: Medium    Aortic valve insufficiency 05/01/2020    Priority: Medium    Symptomatic PVCs 05/14/2019    Priority: Medium    Essential hypertension 01/11/2015    Priority: Medium    Fasting hyperglycemia 03/04/2014    Priority: Medium    Hepatic steatosis mild 11/23/2013    Priority: Medium    BPPV (benign paroxysmal positional vertigo) 11/10/2012    Priority: Medium    Family history of Alzheimer's disease 08/10/2011    Priority: Medium    Hypothyroidism 01/22/2008    Priority: Medium    GERD 01/22/2008    Priority: Medium    Pes anserine bursitis 03/11/2021    Priority: Low   Degenerative disc disease, lumbar 02/11/2021    Priority: Low   Somatic dysfunction of spine, sacral 02/11/2021    Priority: Low   Gastric polyps     Priority: Low   Family history of premature CAD 01/09/2015    Priority: Low   Exertional shortness of breath 12/04/2013    Priority: Low   Perimenopausal vasomotor symptoms 08/10/2011    Priority: Low   Post-nasal drainage 07/30/2011    Priority: Low   Cough, persistent recurrent 07/30/2011    Priority: Low   Postmenopausal HRT (hormone replacement therapy) 06/28/2010    Priority: Low   PLANTAR FASCIITIS, RIGHT 01/02/2008    Priority: Low   Insulin resistance 04/12/2022   Vitamin D deficiency 04/12/2022   Class 1 obesity with serious comorbidity and body mass index (BMI) of 30.0 to 30.9 in adult 04/12/2022   Arthritis of midfoot  07/23/2021    Medications- reviewed and updated Current Outpatient Medications  Medication Sig Dispense Refill   albuterol (VENTOLIN HFA) 108 (90 Base) MCG/ACT inhaler TAKE 2 PUFFS BY MOUTH EVERY 6 HOURS AS NEEDED FOR WHEEZE OR SHORTNESS OF BREATH 6.7 each 1   atorvastatin (LIPITOR) 20 MG tablet TAKE 1 TABLET BY MOUTH EVERY DAY 90 tablet 3   B Complex Vitamins (B-COMPLEX/B-12 PO) Take by mouth. Unknown dosage     calcium carbonate (OS-CAL) 600 MG TABS tablet      celecoxib (CELEBREX) 200 MG capsule Take 1 capsule (200 mg total) by mouth daily. 30 capsule 0   Cholecalciferol (VITAMIN D PO) Take 2 tablets by mouth daily.     ELDERBERRY PO daily.      famotidine (PEPCID) 20 MG tablet Take 20 mg by mouth as needed for heartburn or indigestion.     hydrochlorothiazide (MICROZIDE) 12.5 MG capsule TAKE 1 CAPSULE BY MOUTH EVERY DAY 90 capsule 3   Ibuprofen-Famotidine (DUEXIS) 800-26.6 MG TABS Take 1 tablet by mouth daily as needed (do not take on day you take celebrex). 30 tablet 3   KRILL OIL PO Take by mouth in the morning and at bedtime.     levothyroxine (SYNTHROID) 50 MCG tablet TAKE 1 TABLET BY MOUTH EVERY DAY 90 tablet 3   losartan (COZAAR) 25  MG tablet TAKE 1 TABLET (25 MG TOTAL) BY MOUTH DAILY. (Patient taking differently: Take 12.5 mg by mouth daily.) 90 tablet 0   Misc Natural Products (OSTEO BI-FLEX/5-LOXIN ADVANCED PO) Take by mouth 2 (two) times daily at 8 am and 10 pm.     Multiple Vitamin (MULTIVITAMIN WITH MINERALS) TABS tablet Take 1 tablet by mouth daily.      OVER THE COUNTER MEDICATION Take 1 capsule by mouth daily. CBD oil 2500 mg     tamsulosin (FLOMAX) 0.4 MG CAPS capsule Take 0.4 mg by mouth.     TART CHERRY PO Take 1 tablet by mouth daily.     Turmeric 500 MG TABS Take 1 tablet by mouth 2 (two) times daily.     vitamin A 3 MG (10000 UNITS) capsule Take 10,000 Units by mouth daily.     No current facility-administered medications for this visit.     Objective:  BP  127/76   Pulse 66   Temp 98.2 F (36.8 C) (Temporal)   Resp 16   Ht 5\' 3"  (1.6 m)   Wt 168 lb (76.2 kg)   LMP  (LMP Unknown)   SpO2 96%   BMI 29.76 kg/m  Gen: NAD, resting comfortably CV: RRR no murmurs rubs or gallops Lungs: CTAB no crackles, wheeze, rhonchi Ext: trace edema Skin: warm, dry    Assessment and Plan   # Second toe pain on the right foot S: Patient reports walked a lot on a trip and noted toenail to be bruised and painful afterwards- like a bruise under the nail -visiting [place] for a week and did a log of walking with daughter -pain down from 8/10 to 2/10 recently A/P: this appears to be a subungual hematoma- with pain substantially improved- we opted to monitor- alsod could get Dr. Michaelle Copas opinion next week   # Recent kidney stone-seen in the emergency department 4/24 after her trip-stone was 3 mm and patient unsure if she passed the stone on her own - has urology visit upcoming- no pain but reports planned scan  # Overweight- Working with healthy weight wellness and BMI now in overweight instead of obesity range-congratulated her efforts- high protein helping- discussed could contribute to kidney stones  -long term goal 160 and maintain   #hypertension and PVCs-sees Dr. Herbie [place] S: medication: Hydrochlorothiazide 12.5 mg, losartan 12.5 mg- half tablet of 25 mg -exercising still- no dizziness with reducing losartan to 12.5 mg BP Readings from Last 3 Encounters:  05/10/22 127/76  05/03/22 123/73  04/13/22 121/65  A/P: stable- continue current medicines    #hyperlipidemia- family history CAD S: Medication: Atorvastatin 20 mg daily, Krill oil Lab Results  Component Value Date   CHOL 152 03/18/2022   HDL 60 03/18/2022   LDLCALC 77 03/18/2022   LDLDIRECT 158.4 08/14/2012   TRIG 77 03/18/2022   CHOLHDL 2.8 07/14/2021  A/P: very close to ideal goal and with weight loss may further improve- continue current medications   #hypothyroidism S: compliant On  thyroid medication-levothyroxine 50 mcg -Reassuring thyroid ultrasound 03/03/21-3 mm nodule with no follow-up recommended Lab Results  Component Value Date   TSH 3.780 03/18/2022  A/P:stable- continue current medicines     # Hyperglycemia/insulin resistance/prediabetes-fasting blood sugars elevated in the past but A1c's have been okay S:  Medication: none Lab Results  Component Value Date   HGBA1C 5.5 03/18/2022   HGBA1C 5.4 07/26/2018   HGBA1C 5.3 02/07/2018  A/P: based on prior sugars but a1c has been ok-  continue to monitor - just checked with healthy weight to wellness   # GERD S:Medication: pepcid once or twice daily in past- not needing much anymore  -Nexium 20 mg in past A/P: doing well without medications with weight loss- congratulated on efforts    #Fatty liver-suspect improving with weight loss- LFTs look good Lab Results  Component Value Date   ALT 16 03/18/2022   AST 21 03/18/2022   ALKPHOS 84 03/18/2022   BILITOT 1.0 03/18/2022   #Musculoskeletal concerns-has been seen by Dr. Katrinka Blazing of [facility] in the past-knee pain, degenerative disc disease, hand arthritis- upcoming visit -Both Celebrex and Duexis listed- sparingly needs- thankfully as can raise blood pressure    Recommended follow up: Return in about 6 months (around 11/10/2022) for followup or sooner if needed.Schedule b4 you leave. Future Appointments  Date Time Provider Department Center  05/18/2022 11:20 AM Langston Reusing, MD [facility] None  05/20/2022  2:15 PM Judi Saa, DO [facility] None  06/07/2022 11:20 AM Opalski, Gavin Pound, DO [facility] None    Lab/Order associations:   ICD-10-CM   1. Essential hypertension  I10     2. Hyperlipidemia, unspecified hyperlipidemia type  E78.5     3. Hypothyroidism, unspecified type  E03.9       No orders of the defined types were placed in this encounter.   Return precautions advised.  Tana Conch, MD

## 2022-05-18 ENCOUNTER — Ambulatory Visit (INDEPENDENT_AMBULATORY_CARE_PROVIDER_SITE_OTHER): Payer: Medicare Other | Admitting: Family Medicine

## 2022-05-18 ENCOUNTER — Encounter (INDEPENDENT_AMBULATORY_CARE_PROVIDER_SITE_OTHER): Payer: Self-pay | Admitting: Family Medicine

## 2022-05-18 VITALS — BP 115/65 | HR 66 | Temp 98.1°F | Ht 63.0 in | Wt 162.0 lb

## 2022-05-18 DIAGNOSIS — I10 Essential (primary) hypertension: Secondary | ICD-10-CM

## 2022-05-18 DIAGNOSIS — E669 Obesity, unspecified: Secondary | ICD-10-CM | POA: Diagnosis not present

## 2022-05-18 DIAGNOSIS — N3941 Urge incontinence: Secondary | ICD-10-CM

## 2022-05-18 DIAGNOSIS — Z6828 Body mass index (BMI) 28.0-28.9, adult: Secondary | ICD-10-CM

## 2022-05-18 NOTE — Progress Notes (Unsigned)
Chief Complaint:   OBESITY Jillian Escobar is here to discuss her progress with her obesity treatment plan along with follow-up of her obesity related diagnoses. Jillian Escobar is on the Category 1 Plan and states she is following her eating plan approximately 95% of the time. Jillian Escobar states she is walking and taking an exercise class for 20-45 minutes 5-7 times per week.  Today's visit was #: 5 Starting weight: 170 lbs Starting date: 03/18/2022 Today's weight: 162 lbs Today's date: 05/18/2022 Total lbs lost to date: 12 Total lbs lost since last in-office visit: 2  Interim History: Patient seen previously by Dr. Sharee Holster and me.  She had an ER visit for kidney stone on 04/13/22. She saw urology and is on flomax.  Foodwise she has been sticking to tracking calories and protein.  She is averaging around 1000 calories daily and getting between 75-80g of protein a day.  She isn't really hungry and isn't having many cravings. She is hoping to increase total calorie intake and protein intake with goal to increase RMR.   Subjective:   1. Essential hypertension Jillian Escobar's blood pressure is controlled today.  She denies chest pain, chest pressure, or headache.  2. Urge incontinence Jillian Escobar reports that she has always had significant and frequent urges to urinate.  She never has gone to pelvic floor physical therapy.  Assessment/Plan:   1. Essential hypertension Jillian Escobar will continue her current medications at the same dose.  2. Urge incontinence We will refer Jillian Escobar to pelvic floor physical therapy.  3. Obesity, with starting BMI of 30.11 Jillian Escobar is currently in the action stage of change. As such, her goal is to continue with weight loss efforts. She has agreed to the Category 1 Plan and keeping a food journal and adhering to recommended goals of 1000 calories and 75+ grams of protein daily.   Exercise goals: All adults should avoid inactivity. Some physical activity is better than none, and adults who participate in  any amount of physical activity gain some health benefits.  Behavioral modification strategies: increasing lean protein intake, meal planning and cooking strategies, keeping healthy foods in the home, and planning for success.  Jillian Escobar has agreed to follow-up with our clinic in 3 weeks. She was informed of the importance of frequent follow-up visits to maximize her success with intensive lifestyle modifications for her multiple health conditions.   Objective:   Blood pressure 115/65, pulse 66, temperature 98.1 F (36.7 C), height 5\' 3"  (1.6 m), weight 162 lb (73.5 kg), SpO2 97 %. Body mass index is 28.7 kg/m.  General: Cooperative, alert, well developed, in no acute distress. HEENT: Conjunctivae and lids unremarkable. Cardiovascular: Regular rhythm.  Lungs: Normal work of breathing. Neurologic: No focal deficits.   Lab Results  Component Value Date   CREATININE 0.84 04/13/2022   BUN 20 04/13/2022   NA 139 04/13/2022   K 3.6 04/13/2022   CL 105 04/13/2022   CO2 25 04/13/2022   Lab Results  Component Value Date   ALT 16 03/18/2022   AST 21 03/18/2022   ALKPHOS 84 03/18/2022   BILITOT 1.0 03/18/2022   Lab Results  Component Value Date   HGBA1C 5.5 03/18/2022   HGBA1C 5.4 07/26/2018   HGBA1C 5.3 02/07/2018   Lab Results  Component Value Date   INSULIN 14.5 03/18/2022   Lab Results  Component Value Date   TSH 3.780 03/18/2022   Lab Results  Component Value Date   CHOL 152 03/18/2022   HDL 60 03/18/2022  LDLCALC 77 03/18/2022   LDLDIRECT 158.4 08/14/2012   TRIG 77 03/18/2022   CHOLHDL 2.8 07/14/2021   Lab Results  Component Value Date   VD25OH 58.7 03/18/2022   Lab Results  Component Value Date   WBC 5.3 04/13/2022   HGB 15.0 04/13/2022   HCT 43.3 04/13/2022   MCV 88.0 04/13/2022   PLT 333 04/13/2022   No results found for: "IRON", "TIBC", "FERRITIN"  Attestation Statements:   Reviewed by clinician on day of visit: allergies, medications, problem  list, medical history, surgical history, family history, social history, and previous encounter notes.   I, Burt Knack, am acting as transcriptionist for Reuben Likes, MD.  I have reviewed the above documentation for accuracy and completeness, and I agree with the above. - Reuben Likes, MD

## 2022-05-19 NOTE — Progress Notes (Unsigned)
Jillian Escobar [address] [place] [address] Phone: [phone] Subjective:   Jillian Escobar, am serving as a scribe for Dr. Antoine Primas. I'm seeing this patient by the request  of:  Shelva Majestic, MD  CC: back and neck pain follow up   [id number]  Jillian Escobar is a 72 y.o. female coming in with complaint of back and neck pain. OMT on 03/04/2022. Also seen for midfoot pain. Patient states that her midfoot has been doing well since the injection. C/o pain distal phalynx of 2nd toe. Pain started to increase after walking a lot in [place] when her foot was hitting the top of her shoe.   Back is sore due to putting down some mulch. Back did not bother her on her trip. Went to exercise class last week and her L hip and leg have been tingling on and off since then.   Medications patient has been prescribed: celebrex  Taking: yes         Reviewed prior external information including notes and imaging from previsou exam, outside providers and external EMR if available.   As well as notes that were available from care everywhere and other healthcare systems.  Past medical history, social, surgical and family history all reviewed in electronic medical record.  No pertanent information unless stated regarding to the chief complaint.   Past Medical History:  Diagnosis Date   Allergy    Aortic valve insufficiency    Arthritis    GERD (gastroesophageal reflux disease)    Hx of varicella    Hyperlipidemia    borderline   Hypertension    Hypothyroidism    Pneumonia 11/2014   Only time took inhalers to treat   RBBB 10/2019   recently seen at [facility] - f/u 6 mons, no treatment   Sacral back pain    Shingles 10/26/2010   Syncope and collapse 10/2019   Unclear etiology - just saw MD - f/u 6 months - no treatment   Thyroid disease     Allergies  Allergen Reactions   Levaquin [Levofloxacin]     See notes  September 2019  Palpitations  irritability sleep issues       Review of Systems:  No headache, visual changes, nausea, vomiting, diarrhea, constipation, dizziness, abdominal pain, skin rash, fevers, chills, night sweats, weight loss, swollen lymph nodes, body aches, joint swelling, chest pain, shortness of breath, mood changes. POSITIVE muscle aches  Objective  Blood pressure 128/84, pulse 71, height 5\' 3"  (1.6 m), weight 167 lb (75.8 kg), SpO2 98 %.   General: No apparent distress alert and oriented x3 mood and affect normal, dressed appropriately.  HEENT: Pupils equal, extraocular movements intact  Respiratory: Patient's speak in full sentences and does not appear short of breath  Cardiovascular: No lower extremity edema, non tender, no erythema  Patient on exam of the foot shows that patient does have a hematoma underneath the third toe. Low back exam does have some loss of lordosis noted.  Some tightness with FABER test right greater than left.  Negative straight leg test noted.  Osteopathic findings  C2 flexed rotated and side bent right C5 flexed rotated and side bent left T3 extended rotated and side bent right inhaled rib T8 extended rotated and side bent left L2 flexed rotated and side bent right L4 flexed rotated and side bent left Sacrum right on right       Assessment and Plan:  Degenerative disc  disease, lumbar Degenerative disc disease noted.  Discussed icing regimen and home exercises.  Discussed with patient to continue to work on core strengthening.  Patient will be traveling again and will follow-up with me again in 6 weeks otherwise.    Nonallopathic problems  Decision today to treat with OMT was based on Physical Exam  After verbal consent patient was treated with HVLA, ME, FPR techniques in cervical, rib, thoracic, lumbar, and sacral  areas  Patient tolerated the procedure well with improvement in symptoms  Patient given exercises, stretches and lifestyle modifications  See  medications in patient instructions if given  Patient will follow up in 4-8 weeks     The above documentation has been reviewed and is accurate and complete Jillian Saa, DO         Note: This dictation was prepared with Dragon dictation along with smaller phrase technology. Any transcriptional errors that result from this process are unintentional.

## 2022-05-20 ENCOUNTER — Ambulatory Visit (INDEPENDENT_AMBULATORY_CARE_PROVIDER_SITE_OTHER): Payer: Medicare Other | Admitting: Family Medicine

## 2022-05-20 ENCOUNTER — Other Ambulatory Visit: Payer: Self-pay

## 2022-05-20 VITALS — BP 128/84 | HR 71 | Ht 63.0 in | Wt 167.0 lb

## 2022-05-20 DIAGNOSIS — M9901 Segmental and somatic dysfunction of cervical region: Secondary | ICD-10-CM | POA: Diagnosis not present

## 2022-05-20 DIAGNOSIS — M9904 Segmental and somatic dysfunction of sacral region: Secondary | ICD-10-CM | POA: Diagnosis not present

## 2022-05-20 DIAGNOSIS — M9908 Segmental and somatic dysfunction of rib cage: Secondary | ICD-10-CM | POA: Diagnosis not present

## 2022-05-20 DIAGNOSIS — M9902 Segmental and somatic dysfunction of thoracic region: Secondary | ICD-10-CM | POA: Diagnosis not present

## 2022-05-20 DIAGNOSIS — M9903 Segmental and somatic dysfunction of lumbar region: Secondary | ICD-10-CM

## 2022-05-20 DIAGNOSIS — M5136 Other intervertebral disc degeneration, lumbar region: Secondary | ICD-10-CM | POA: Diagnosis not present

## 2022-05-20 DIAGNOSIS — M25571 Pain in right ankle and joints of right foot: Secondary | ICD-10-CM | POA: Diagnosis not present

## 2022-05-20 DIAGNOSIS — G8929 Other chronic pain: Secondary | ICD-10-CM

## 2022-05-20 NOTE — Patient Instructions (Signed)
Glad day 1 of world traveling was a success! Toenail is the toenail  See me again late June/Early July

## 2022-05-20 NOTE — Assessment & Plan Note (Signed)
Degenerative disc disease noted.  Discussed icing regimen and home exercises.  Discussed with patient to continue to work on core strengthening.  Patient will be traveling again and will follow-up with me again in 6 weeks otherwise.

## 2022-06-03 ENCOUNTER — Encounter: Payer: Self-pay | Admitting: Physical Therapy

## 2022-06-03 ENCOUNTER — Ambulatory Visit: Payer: Medicare Other | Attending: Family Medicine | Admitting: Physical Therapy

## 2022-06-03 ENCOUNTER — Other Ambulatory Visit: Payer: Self-pay

## 2022-06-03 DIAGNOSIS — N3941 Urge incontinence: Secondary | ICD-10-CM | POA: Insufficient documentation

## 2022-06-03 DIAGNOSIS — M6281 Muscle weakness (generalized): Secondary | ICD-10-CM | POA: Insufficient documentation

## 2022-06-03 DIAGNOSIS — R279 Unspecified lack of coordination: Secondary | ICD-10-CM | POA: Insufficient documentation

## 2022-06-03 DIAGNOSIS — R293 Abnormal posture: Secondary | ICD-10-CM | POA: Diagnosis present

## 2022-06-03 NOTE — Patient Instructions (Addendum)
Urge Incontinence  Ideal urination frequency is every 2-4 wakeful hours, which equates to 5-8 times within a 24-hour period.   Urge incontinence is leakage that occurs when the bladder muscle contracts, creating a sudden need to go before getting to the bathroom.   Going too often when your bladder isn't actually full can disrupt the body's automatic signals to store and hold urine longer, which will increase urgency/frequency.  In this case, the bladder "is running the show" and strategies can be learned to retrain this pattern.   One should be able to control the first urge to urinate, at around 150mL.  The bladder can hold up to a "grande latte," or 400mL. To help you gain control, practice the Urge Drill below when urgency strikes.  This drill will help retrain your bladder signals and allow you to store and hold urine longer.  The overall goal is to stretch out your time between voids to reach a more manageable voiding schedule.    Practice your "quick flicks" often throughout the day (each waking hour) even when you don't need feel the urge to go.  This will help strengthen your pelvic floor muscles, making them more effective in controlling leakage.  Urge Drill  When you feel an urge to go, follow these steps to regain control: Stop what you are doing and be still Take one deep breath, directing your air into your abdomen Think an affirming thought, such as "I've got this." Do 5 quick flicks of your pelvic floor Walk with control to the bathroom to void, or delay voiding   Bladder Irritants  Certain foods and beverages can be irritating to the bladder.  Avoiding these irritants may decrease your symptoms of urinary urgency, frequency or bladder pain.  Even reducing your intake can help with your symptoms.  Not everyone is sensitive to all bladder irritants, so you may consider focusing on one irritant at a time, removing or reducing your intake of that irritant for 7-10 days to see if  this change helps your symptoms.  Water intake is also very important.  Below is a list of bladder irritants.  Drinks: alcohol, carbonated beverages, caffeinated beverages such as coffee and tea, drinks with artificial sweeteners, citrus juices, apple juice, tomato juice  Foods: tomatoes and tomato based foods, spicy food, sugar and artificial sweeteners, vinegar, chocolate, raw onion, apples, citrus fruits, pineapple, cranberries, tomatoes, strawberries, plums, peaches, cantaloupe  Other: acidic urine (too concentrated) - see water intake info below  Substitutes you can try that are NOT irritating to the bladder: cooked onion, pears, papayas, sun-brewed decaf teas, watermelons, non-citrus herbal teas, apricots, kava and low-acid instant drinks (Postum).    WATER INTAKE: Remember to drink lots of water (aim for fluid intake of half your body weight with 2/3 of fluids being water).  You may be limiting fluids due to fear of leakage, but this can actually worsen urgency symptoms due to highly concentrated urine.  Water helps balance the pH of your urine so it doesn't become too acidic - acidic urine is a bladder irritant!    

## 2022-06-03 NOTE — Therapy (Signed)
OUTPATIENT PHYSICAL THERAPY FEMALE PELVIC EVALUATION   Patient Name: Jillian Escobar MRN: [id number] DOB:1950/11/30, 72 y.o., female Today's Date: 06/03/2022  END OF SESSION:  PT End of Session - 06/03/22 1137     Visit Number 1    Date for PT Re-Evaluation 09/03/22    Authorization Type MCR    Progress Note Due on Visit 10    PT Start Time 1140    PT Stop Time 1220    PT Time Calculation (min) 40 min    Activity Tolerance Patient tolerated treatment well    Behavior During Therapy [facility] for tasks assessed/performed             Past Medical History:  Diagnosis Date   Allergy    Aortic valve insufficiency    Arthritis    GERD (gastroesophageal reflux disease)    Hx of varicella    Hyperlipidemia    borderline   Hypertension    Hypothyroidism    Pneumonia 11/2014   Only time took inhalers to treat   RBBB 10/2019   recently seen at [facility] - f/u 6 mons, no treatment   Sacral back pain    Shingles 10/26/2010   Syncope and collapse 10/2019   Unclear etiology - just saw [facility] - f/u 6 months - no treatment   Thyroid disease    Past Surgical History:  Procedure Laterality Date   CESAREAN SECTION     x1   CHOLECYSTECTOMY     COLONOSCOPY  2011   tics, no polyps - recall 5 yr   ESOPHAGOGASTRODUODENOSCOPY (EGD) WITH PROPOFOL N/A 12/09/2015   Procedure: ESOPHAGOGASTRODUODENOSCOPY (EGD) WITH PROPOFOL;  Surgeon: Ruffin Frederick, MD;  Location: [facility];  Service: Gastroenterology;  Laterality: N/A;   EXPLORATORY LAPAROTOMY     Gallbladder   KIDNEY STONE SURGERY Left    POLYPECTOMY N/A 12/09/2015   Procedure: POLYPECTOMY;  Surgeon: Ruffin Frederick, MD;  Location: [facility];  Service: Gastroenterology;  Laterality: N/A;   TRANSTHORACIC ECHOCARDIOGRAM  10/07/2020   EF 60 to 65%.  Mild concentric LVH.  GRII DD.  Mild to moderate AI.  Normal PAP.  Normal RAP.  Normal RAP.  Stable AI   TUBAL LIGATION     WISDOM TOOTH EXTRACTION     Patient Active Problem List    Diagnosis Date Noted   Insulin resistance 04/12/2022   Vitamin D deficiency 04/12/2022   Class 1 obesity with serious comorbidity and body mass index (BMI) of 30.0 to 30.9 in adult 04/12/2022   Arthritis of midfoot 07/23/2021   Hyperlipidemia, unspecified 05/08/2021   Pes anserine bursitis 03/11/2021   Degenerative disc disease, lumbar 02/11/2021   Somatic dysfunction of spine, sacral 02/11/2021   Aortic valve insufficiency 05/01/2020   Symptomatic PVCs 05/14/2019   Gastric polyps    Essential hypertension 01/11/2015   Family history of premature CAD 01/09/2015   Fasting hyperglycemia 03/04/2014   Exertional shortness of breath 12/04/2013   Hepatic steatosis mild 11/23/2013   BPPV (benign paroxysmal positional vertigo) 11/10/2012   Family history of Alzheimer's disease 08/10/2011   Perimenopausal vasomotor symptoms 08/10/2011   Post-nasal drainage 07/30/2011   Cough, persistent recurrent 07/30/2011   Postmenopausal HRT (hormone replacement therapy) 06/28/2010   Hypothyroidism 01/22/2008   GERD 01/22/2008   PLANTAR FASCIITIS, RIGHT 01/02/2008    PCP: Shelva Majestic, MD  REFERRING PROVIDER: Langston Reusing, MD  REFERRING DIAG: N39.41 (ICD-10-CM) - Urge incontinence  THERAPY DIAG:  Muscle weakness (generalized)  Abnormal posture  Unspecified  lack of coordination  Rationale for Evaluation and Treatment: Rehabilitation  ONSET DATE: life long  SUBJECTIVE:                                                                                                                                                                                           SUBJECTIVE STATEMENT: Increased urinary frequency, night time urination, feels like I have always gone a lot anyway. Been trying to increase water intake.   Fluid intake: Yes: water - 72 oz, one large cup of coffee daily    PAIN:  Are you having pain? No  PRECAUTIONS: None  WEIGHT BEARING RESTRICTIONS: No  FALLS:  Has  patient fallen in last 6 months? No  LIVING ENVIRONMENT: Lives with: lives with their family Lives in: House/apartment   OCCUPATION: retired   PLOF: Independent  PATIENT GOALS: to have less urinary frequency   PERTINENT HISTORY:  HTN, obesity, c-section, arthritis, GERD, Rt plantar fascitis  Sexual abuse: No  BOWEL MOVEMENT: Pain with bowel movement: No Type of bowel movement:Type (Bristol Stool Scale) 4, Frequency daily, and Strain No Fully empty rectum: Yes:   Leakage: No Pads: No Fiber supplement: No  URINATION: Pain with urination: No Fully empty bladder: Yes:   Stream: Strong and Weak Urgency: Yes:   Frequency: sometimes every 10 mins Leakage:  none Pads: No  INTERCOURSE: Pain with intercourse:  not painful Ability to have vaginal penetration:  No Climax: not painful Marinoff Scale: 0/3  PREGNANCY: Vaginal deliveries 0 Tearing No C-section deliveries 1 Currently pregnant No  PROLAPSE: None   OBJECTIVE:   DIAGNOSTIC FINDINGS:    COGNITION: Overall cognitive status: Within functional limits for tasks assessed     SENSATION: Light touch: Appears intact Proprioception: Appears intact  MUSCLE LENGTH: Bil hamstrings and adductors limited by 25%  POSTURE: rounded shoulders   PELVIC ALIGNMENT: WFL  LUMBARAROM/PROM:  A/PROM A/PROM  eval  Flexion   Extension   Right lateral flexion   Left lateral flexion   Right rotation   Left rotation    (Blank rows = not tested)  LOWER EXTREMITY ROM:  WFL  LOWER EXTREMITY MMT:  Bil hips 4+/5; knees 5/5  PALPATION:   General  no TTP                External Perineal Exam pt deferred                              Internal Pelvic Floor pt deferred   Patient confirms identification and approves PT to assess internal pelvic floor and treatment No  PELVIC MMT:   MMT eval  Vaginal   Internal Anal Sphincter   External Anal Sphincter   Puborectalis   Diastasis Recti   (Blank rows = not  tested)        TONE: Pt deferred   PROLAPSE: Pt deferred   TODAY'S TREATMENT:                                                                                                                              DATE:   06/03/22 EVAL Examination completed, findings reviewed, pt educated on POC, urge drill, bladder irritants, and bladder diary. Pt motivated to participate in PT and agreeable to attempt recommendations.     PATIENT EDUCATION:  Education details:  urge drill, bladder irritants, and bladder diary Person educated: Patient Education method: Explanation, Demonstration, Tactile cues, Verbal cues, and Handouts Education comprehension: verbalized understanding and returned demonstration  HOME EXERCISE PROGRAM:  urge drill, bladder irritants, and bladder diary  ASSESSMENT:  CLINICAL IMPRESSION: Patient is a 72 y.o. female  who was seen today for physical therapy evaluation and treatment for urinary frequency. Pt reports she has been drinking more water and working out more and noticed an increase in urinary frequency but notes she feels she has always had increased frequency but worse now. Pt deferred internal today and opted to see if lifestyle and exercises recommendations help first. Pt educated on urge drill, bladder diary and bladder irritants for improved bladder retraining for decreased frequency. Pt denied additional questions, plans to go on out of town for 1 month and if no improvement while on trip will return for treatments. Pt had mild hip and core weakness, mild decreased flexibility in spine and hips. And if no change in frequency with these changes, agreeable to internal for pelvic floor testing. Pt would benefit from additional PT to further address deficits.    OBJECTIVE IMPAIRMENTS: decreased coordination, decreased strength, improper body mechanics, and postural dysfunction.   ACTIVITY LIMITATIONS: continence  PARTICIPATION LIMITATIONS: community activity  PERSONAL  FACTORS: Time since onset of injury/illness/exacerbation are also affecting patient's functional outcome.   REHAB POTENTIAL: Good  CLINICAL DECISION MAKING: Stable/uncomplicated  EVALUATION COMPLEXITY: Low   GOALS: Goals reviewed with patient? Yes  SHORT TERM GOALS: Target date: 07/01/22  Pt to be I with HEP.  Baseline: Goal status: INITIAL   LONG TERM GOALS: Target date: 09/03/22  Pt to be I with advanced HEP.  Baseline:  Goal status: INITIAL  2.  Pt to demonstrate at least 5/5 bil hip strength for improved pelvic stability and functional squats without leakage.  Baseline:  Goal status: INITIAL  3.  Pt will have 75% less urgency due to bladder retraining and strengthening  Baseline:  Goal status: INITIAL  4.  Pt to demonstrate at least 4/5 pelvic floor strength for improved pelvic stability and decreased strain at pelvic floor/ decrease leakage.  Baseline:  Goal status: INITIAL  5.  Pt to report improved time  between bladder voids to at least 3 hours for improved QOL with decreased urinary frequency.   Baseline:  Goal status: INITIAL  6.  Pt to report no more than 1x a night for urinary frequency for improved QOL.  Baseline:  Goal status: INITIAL  PLAN:  PT FREQUENCY: every other week  PT DURATION:  5 sessions  PLANNED INTERVENTIONS: Therapeutic exercises, Therapeutic activity, Neuromuscular re-education, Patient/Family education, Self Care, Joint mobilization, Dry Needling, Electrical stimulation, Cryotherapy, Moist heat, Taping, Biofeedback, and Manual therapy  PLAN FOR NEXT SESSION: internal if needed and pt consents, core and hip strengthening, posture training, breathing mechanics   Otelia Sergeant, PT, DPT 06/03/2410:23 PM

## 2022-06-07 ENCOUNTER — Encounter (INDEPENDENT_AMBULATORY_CARE_PROVIDER_SITE_OTHER): Payer: Self-pay | Admitting: Family Medicine

## 2022-06-07 ENCOUNTER — Ambulatory Visit (INDEPENDENT_AMBULATORY_CARE_PROVIDER_SITE_OTHER): Payer: Medicare Other | Admitting: Family Medicine

## 2022-06-07 VITALS — BP 108/68 | HR 63 | Temp 98.5°F | Ht 63.0 in | Wt 159.0 lb

## 2022-06-07 DIAGNOSIS — E669 Obesity, unspecified: Secondary | ICD-10-CM | POA: Diagnosis not present

## 2022-06-07 DIAGNOSIS — E88819 Insulin resistance, unspecified: Secondary | ICD-10-CM

## 2022-06-07 DIAGNOSIS — I10 Essential (primary) hypertension: Secondary | ICD-10-CM

## 2022-06-07 DIAGNOSIS — Z6828 Body mass index (BMI) 28.0-28.9, adult: Secondary | ICD-10-CM

## 2022-06-07 NOTE — Progress Notes (Signed)
[name], D.O.  ABFM, ABOM Specializing in Clinical Bariatric Medicine  Office located at: [address] [place], [place]  [address]     Assessment and Plan:   Insulin resistance Assessment: Condition is not optimized.  Lab Results  Component Value Date   HGBA1C 5.5 03/18/2022   HGBA1C 5.4 07/26/2018   HGBA1C 5.3 02/07/2018   INSULIN 14.5 03/18/2022  - This condition is diet controlled. Patient is not any med for this condition.  - Patient endorses having increased cravings for carbs in the morning, which she attributes to seeing a lot of tempting foods on TV.   Plan:  - Isatou will continue to work on weight loss, exercise, via their meal plan we devised to help decrease the risk of progressing to diabetes.  - We will recheck A1c and fasting insulin level in approximately 3 months from last check, or as deemed appropriate.     Essential hypertension Assessment: Condition is stable. Last 3 blood pressure readings in our office are as follows: BP Readings from Last 3 Encounters:  06/07/22 108/68  05/20/22 128/84  05/18/22 115/65  - Her blood pressure is stable today. No concerns.  - No issues with Microzide 12.5 mg daily and Cozaar 12.5 mg daily. Denies any adverse effects.  Plan: - Continue with antihypertensive medications as recommended by specialist. - Continue with Prudent nutritional plan and low sodium diet, advance exercise as tolerated.   - We will continue to monitor symptoms as they relate to the her weight loss journey.    TREATMENT PLAN FOR OBESITY: Obesity, with starting BMI of 30.11 Assessment:  Jillian Escobar is here to discuss her progress with her obesity treatment plan along with follow-up of her obesity related diagnoses. See Medical Weight Management Flowsheet for complete bioelectrical impedance results.  Condition is improving, but not optimized. Biometric data collected today, was reviewed with patient.   Since last office  visit on 05/18/22 patient's  Muscle mass has decreased by 1 lb. Fat mass has decreased by 2.2 lb. Total body water has decreased by 1.2 lb.  Counseling done on how various foods will affect these numbers and how to maximize success  Total lbs lost to date: 11 Total weight loss percentage to date: 6.47   Plan:  - Continue with Category 1 meal plan and food journaling [phone] calories and 80+ protein.  - Counseling was done on ways to make healthier choices when out of town: having small bites of desert/minimizing carbs and having large servings of protein with every meal.   Behavioral Intervention Additional resources provided today: patient declined Evidence-based interventions for health behavior change were utilized today including the discussion of self monitoring techniques, problem-solving barriers and SMART goal setting techniques.   Regarding patient's less desirable eating habits and patterns, we employed the technique of small changes.  Pt will specifically work on: walking 10,000 steps daily and making mindful choices while out of town for next visit.    Recommended Physical Activity Goals  Delcine has been advised to slowly work up to 150 minutes of moderate intensity aerobic activity a week and strengthening exercises 2-3 times per week for cardiovascular health, weight loss maintenance and preservation of muscle mass.   She has agreed to Continue current level of physical activity    FOLLOW UP: Return in about 5 weeks (around 07/12/2022). She was informed of the importance of frequent follow up visits to maximize her success with intensive lifestyle modifications for her multiple  health conditions.   Subjective:   Chief complaint: Obesity Jillian Escobar is here to discuss her progress with her obesity treatment plan. She is on the the Category 1 Plan and keeping a food journal and adhering to recommended goals of [phone] calories and 80+ protein and states she is following her eating  plan approximately 80-85% of the time. She states she is walking and going to gym 30-60 minutes 7 days per week.  Interval History:  Jillian Escobar is here for a follow up office visit.     Since last office visit:   - Endorses having increased cravings lately in the mornings, which she attributes to seeing a lot of tempting foods on TV.  - On most days, she is averaging [phone] calories and is getting 75-90 grams of protein daily.  - On Friday, she leaves for [place], [place] for 1 month.   Review of Systems:  Pertinent positives were addressed with patient today.  Weight Summary and Biometrics   Weight Lost Since Last Visit: 3lb  Weight Gained Since Last Visit: 0lb    Vitals Temp: 98.5 F (36.9 C) BP: 108/68 Pulse Rate: 63 SpO2: 97 %   Anthropometric Measurements Height: 5\' 3"  (1.6 m) Weight: 159 lb (72.1 kg) BMI (Calculated): 28.17 Weight at Last Visit: 162lb Weight Lost Since Last Visit: 3lb Weight Gained Since Last Visit: 0lb Starting Weight: 170lb Total Weight Loss (lbs): 11 lb (4.99 kg) Peak Weight: 197lb   Body Composition  Body Fat %: 38.1 % Fat Mass (lbs): 60.8 lbs Muscle Mass (lbs): 93.6 lbs Total Body Water (lbs): 66.8 lbs Visceral Fat Rating : 10   Other Clinical Data Fasting: no Labs: no Today's Visit #: 5 Starting Date: 03/18/22   Objective:   PHYSICAL EXAM: Blood pressure 108/68, pulse 63, temperature 98.5 F (36.9 C), height 5\' 3"  (1.6 m), weight 159 lb (72.1 kg), SpO2 97 %. Body mass index is 28.17 kg/m.  General: Well Developed, well nourished, and in no acute distress.  HEENT: Normocephalic, atraumatic Skin: Warm and dry, cap RF less 2 sec, good turgor Chest:  Normal excursion, shape, no gross abn Respiratory: speaking in full sentences, no conversational dyspnea NeuroM-Sk: Ambulates w/o assistance, moves * 4 Psych: A and O *3, insight good, mood-full  DIAGNOSTIC DATA REVIEWED:  BMET    Component Value Date/Time   NA  139 04/13/2022 0543   NA 140 03/18/2022 0847   K 3.6 04/13/2022 0543   CL 105 04/13/2022 0543   CO2 25 04/13/2022 0543   GLUCOSE 114 (H) 04/13/2022 0543   BUN 20 04/13/2022 0543   BUN 14 03/18/2022 0847   CREATININE 0.84 04/13/2022 0543   CALCIUM 9.9 04/13/2022 0543   GFRNONAA >60 04/13/2022 0543   GFRAA 73 04/04/2019 1234   Lab Results  Component Value Date   HGBA1C 5.5 03/18/2022   HGBA1C 5.3 02/07/2018   Lab Results  Component Value Date   INSULIN 14.5 03/18/2022   Lab Results  Component Value Date   TSH 3.780 03/18/2022   CBC    Component Value Date/Time   WBC 5.3 04/13/2022 0543   RBC 4.92 04/13/2022 0543   HGB 15.0 04/13/2022 0543   HGB 14.8 03/18/2022 0847   HCT 43.3 04/13/2022 0543   HCT 43.3 03/18/2022 0847   PLT 333 04/13/2022 0543   PLT 300 03/18/2022 0847   MCV 88.0 04/13/2022 0543   MCV 91 03/18/2022 0847   MCH 30.5 04/13/2022 0543   MCHC 34.6 04/13/2022 0543  RDW 12.9 04/13/2022 0543   RDW 12.6 03/18/2022 0847   Iron Studies No results found for: "IRON", "TIBC", "FERRITIN", "IRONPCTSAT" Lipid Panel     Component Value Date/Time   CHOL 152 03/18/2022 0847   TRIG 77 03/18/2022 0847   HDL 60 03/18/2022 0847   CHOLHDL 2.8 07/14/2021 0837   CHOLHDL 4.1 08/21/2019 0915   VLDL 18.4 07/26/2018 0803   LDLCALC 77 03/18/2022 0847   LDLCALC 133 (H) 08/21/2019 0915   LDLDIRECT 158.4 08/14/2012 0828   Hepatic Function Panel     Component Value Date/Time   PROT 6.9 03/18/2022 0847   ALBUMIN 4.5 03/18/2022 0847   AST 21 03/18/2022 0847   ALT 16 03/18/2022 0847   ALKPHOS 84 03/18/2022 0847   BILITOT 1.0 03/18/2022 0847   BILIDIR 0.25 03/10/2020 0935   IBILI 0.8 08/21/2019 0915      Component Value Date/Time   TSH 3.780 03/18/2022 0847   Nutritional Lab Results  Component Value Date   VD25OH 58.7 03/18/2022    Attestations:   Reviewed by clinician on day of visit: allergies, medications, problem list, medical history, surgical history,  family history, social history, and previous encounter notes.   This encounter took 30 total minutes of time including any pre-visit and post-visit time spent on this date of service only, including taking a thorough history, reviewing any labs and/or imaging, reviewing prior notes, counseling the patient, coordinating care as well as documenting in the electronic health record on the date of service.   I,Special Puri,acting as a Neurosurgeon for [employer], DO.,have documented all relevant documentation on the behalf of Thomasene Lot, DO,as directed by  Thomasene Lot, DO while in the presence of Thomasene Lot, DO.  I, Thomasene Lot, DO, have reviewed all documentation for this visit. The documentation on 06/07/22 for the exam, diagnosis, procedures, and orders are all accurate and complete.

## 2022-06-26 ENCOUNTER — Other Ambulatory Visit: Payer: Self-pay | Admitting: Cardiology

## 2022-06-29 NOTE — Progress Notes (Unsigned)
[name] [address] [place] [address] Phone: [phone] Subjective:   [name], am serving as a scribe for Dr. Antoine Primas.  I'm seeing this patient by the request  of:  Shelva Majestic, MD  CC: Back and neck pain follow-up  [id number]  Jillian Escobar is a 72 y.o. female coming in with complaint of back and neck pain. OMT on 05/20/2022. Patient states that she has been doing well since last visit. Massage therapist noted tightness in R side of cervical spine to lumbar spine.   Foot is still doing well.  No pain since injection.           Reviewed prior external information including notes and imaging from previsou exam, outside providers and external EMR if available.   As well as notes that were available from care everywhere and other healthcare systems.  Past medical history, social, surgical and family history all reviewed in electronic medical record.  No pertanent information unless stated regarding to the chief complaint.   Past Medical History:  Diagnosis Date   Allergy    Aortic valve insufficiency    Arthritis    GERD (gastroesophageal reflux disease)    Hx of varicella    Hyperlipidemia    borderline   Hypertension    Hypothyroidism    Pneumonia 11/2014   Only time took inhalers to treat   RBBB 10/2019   recently seen at [facility] - f/u 6 mons, no treatment   Sacral back pain    Shingles 10/26/2010   Syncope and collapse 10/2019   Unclear etiology - just saw MD - f/u 6 months - no treatment   Thyroid disease     Allergies  Allergen Reactions   Levaquin [Levofloxacin]     See notes  September 2019  Palpitations irritability sleep issues       Review of Systems:  No headache, visual changes, nausea, vomiting, diarrhea, constipation, dizziness, abdominal pain, skin rash, fevers, chills, night sweats, weight loss, swollen lymph nodes, body aches, joint swelling, chest pain, shortness of breath,  mood changes. POSITIVE muscle aches  Objective  Blood pressure 110/76, pulse 61, height 5\' 3"  (1.6 m), weight 162 lb (73.5 kg), SpO2 99 %.   General: No apparent distress alert and oriented x3 mood and affect normal, dressed appropriately.  HEENT: Pupils equal, extraocular movements intact  Respiratory: Patient's speak in full sentences and does not appear short of breath  Cardiovascular: No lower extremity edema, non tender, no erythema  Back exam does have some mild loss of lordosis.  Some increasing kyphosis of the upper thoracic spine.  Tightness noted in the trapezius bilaterally.  Osteopathic findings  C2 flexed rotated and side bent right C6 flexed rotated and side bent right T2 extended rotated and side bent right inhaled rib T3 extended rotated and side bent left L2 flexed rotated and side bent right Sacrum right on right       Assessment and Plan:  Degenerative disc disease, lumbar Degenerative disc disease still noted.  Continue to work on Air cabin crew.  Patient has been losing weight.  I do feel that this is going to be one of the most beneficial thing she can do.  Patient seems to be also strengthening her core.  Follow-up with me again in 6 to 8 weeks    Nonallopathic problems  Decision today to treat with OMT was based on Physical Exam  After verbal consent patient was  treated with HVLA, ME, FPR techniques in cervical, rib, thoracic, lumbar, and sacral  areas  Patient tolerated the procedure well with improvement in symptoms  Patient given exercises, stretches and lifestyle modifications  See medications in patient instructions if given  Patient will follow up in 6-8 weeks      The above documentation has been reviewed and is accurate and complete Judi Saa, DO        Note: This dictation was prepared with Dragon dictation along with smaller phrase technology. Any transcriptional errors that result from this process are unintentional.

## 2022-07-01 ENCOUNTER — Encounter: Payer: Self-pay | Admitting: Family Medicine

## 2022-07-01 ENCOUNTER — Ambulatory Visit (INDEPENDENT_AMBULATORY_CARE_PROVIDER_SITE_OTHER): Payer: Medicare Other | Admitting: Family Medicine

## 2022-07-01 VITALS — BP 110/76 | HR 61 | Ht 63.0 in | Wt 162.0 lb

## 2022-07-01 DIAGNOSIS — M9903 Segmental and somatic dysfunction of lumbar region: Secondary | ICD-10-CM

## 2022-07-01 DIAGNOSIS — M9904 Segmental and somatic dysfunction of sacral region: Secondary | ICD-10-CM | POA: Diagnosis not present

## 2022-07-01 DIAGNOSIS — M5136 Other intervertebral disc degeneration, lumbar region: Secondary | ICD-10-CM

## 2022-07-01 DIAGNOSIS — M9908 Segmental and somatic dysfunction of rib cage: Secondary | ICD-10-CM | POA: Diagnosis not present

## 2022-07-01 DIAGNOSIS — M9901 Segmental and somatic dysfunction of cervical region: Secondary | ICD-10-CM

## 2022-07-01 DIAGNOSIS — M9902 Segmental and somatic dysfunction of thoracic region: Secondary | ICD-10-CM | POA: Diagnosis not present

## 2022-07-01 NOTE — Patient Instructions (Signed)
Good to see you Thank your massage therapist for me See me again in 6 weeks

## 2022-07-01 NOTE — Assessment & Plan Note (Signed)
Degenerative disc disease still noted.  Continue to work on Air cabin crew.  Patient has been losing weight.  I do feel that this is going to be one of the most beneficial thing she can do.  Patient seems to be also strengthening her core.  Follow-up with me again in 6 to 8 weeks

## 2022-07-12 ENCOUNTER — Ambulatory Visit (INDEPENDENT_AMBULATORY_CARE_PROVIDER_SITE_OTHER): Payer: Medicare Other | Admitting: Family Medicine

## 2022-07-12 ENCOUNTER — Ambulatory Visit: Payer: Medicare Other | Admitting: Physical Therapy

## 2022-07-12 ENCOUNTER — Encounter (INDEPENDENT_AMBULATORY_CARE_PROVIDER_SITE_OTHER): Payer: Self-pay

## 2022-07-19 ENCOUNTER — Ambulatory Visit (INDEPENDENT_AMBULATORY_CARE_PROVIDER_SITE_OTHER): Payer: Medicare Other

## 2022-07-19 VITALS — Wt 160.0 lb

## 2022-07-19 DIAGNOSIS — Z0000 Encounter for general adult medical examination without abnormal findings: Secondary | ICD-10-CM

## 2022-07-19 NOTE — Progress Notes (Signed)
Subjective:   Jillian Escobar is a 72 y.o. female who presents for Medicare Annual (Subsequent) preventive examination.  Visit Complete: Virtual  I connected with  Jillian Escobar on 07/19/22 by a audio enabled telemedicine application and verified that I am speaking with the correct person using two identifiers.  Patient Location: Home  Provider Location: Office/Clinic  I discussed the limitations of evaluation and management by telemedicine. The patient expressed understanding and agreed to proceed.  Patient Medicare AWV questionnaire was completed by the patient on 07/15/22; I have confirmed that all information answered by patient is correct and no changes since this date.  Review of Systems     Cardiac Risk Factors include: advanced age (>45men, >18 women);hypertension;dyslipidemia     Objective:    Today's Vitals   07/19/22 0801  Weight: 160 lb (72.6 kg)   Body mass index is 28.34 kg/m.     07/19/2022    8:09 AM 06/03/2022   12:24 PM 04/13/2022    5:33 AM 05/04/2021   10:04 AM 03/19/2020    2:17 PM 10/17/2017    9:27 AM 12/09/2015    6:42 AM  Advanced Directives  Does Patient Have a Medical Advance Directive? Yes Yes No Yes Yes Yes Yes  Type of Estate agent of [place];Living will   Healthcare Power of [place];Living will Healthcare Power of [place];Living will Living will;Healthcare Power of [employer] Power of [place];Living will  Does patient want to make changes to medical advance directive?  No - Patient declined   No - Patient declined    Copy of Healthcare Power of Attorney in Chart? No - copy requested   No - copy requested No - copy requested No - copy requested Yes  Would patient like information on creating a medical advance directive?   No - Patient declined        Current Medications (verified) Outpatient Encounter Medications as of 07/19/2022  Medication Sig   albuterol (VENTOLIN HFA) 108 (90 Base) MCG/ACT inhaler TAKE  2 PUFFS BY MOUTH EVERY 6 HOURS AS NEEDED FOR WHEEZE OR SHORTNESS OF BREATH   atorvastatin (LIPITOR) 20 MG tablet TAKE 1 TABLET BY MOUTH EVERY DAY   B Complex Vitamins (B-COMPLEX/B-12 PO) Take by mouth. Unknown dosage   calcium carbonate (OS-CAL) 600 MG TABS tablet    celecoxib (CELEBREX) 200 MG capsule Take 1 capsule (200 mg total) by mouth daily.   Cholecalciferol (VITAMIN D PO) Take 2 tablets by mouth daily.   ELDERBERRY PO daily.    famotidine (PEPCID) 20 MG tablet Take 20 mg by mouth as needed for heartburn or indigestion.   hydrochlorothiazide (MICROZIDE) 12.5 MG capsule TAKE 1 CAPSULE BY MOUTH EVERY DAY   KRILL OIL PO Take by mouth in the morning and at bedtime.   levothyroxine (SYNTHROID) 50 MCG tablet TAKE 1 TABLET BY MOUTH EVERY DAY   losartan (COZAAR) 25 MG tablet TAKE 1 TABLET (25 MG TOTAL) BY MOUTH DAILY. (Patient taking differently: Take 12.5 mg by mouth daily.)   Misc Natural Products (OSTEO BI-FLEX/5-LOXIN ADVANCED PO) Take by mouth 2 (two) times daily at 8 am and 10 pm.   Multiple Vitamin (MULTIVITAMIN WITH MINERALS) TABS tablet Take 1 tablet by mouth daily.    OVER THE COUNTER MEDICATION Take 1 capsule by mouth daily. CBD oil 2500 mg   TART CHERRY PO Take 1 tablet by mouth daily.   Turmeric 500 MG TABS Take 1 tablet by mouth 2 (two) times daily.  vitamin A 3 MG (10000 UNITS) capsule Take 10,000 Units by mouth daily.   [DISCONTINUED] Ibuprofen-Famotidine (DUEXIS) 800-26.6 MG TABS Take 1 tablet by mouth daily as needed (do not take on day you take celebrex).   No facility-administered encounter medications on file as of 07/19/2022.    Allergies (verified) Levaquin [levofloxacin]   History: Past Medical History:  Diagnosis Date   Allergy    Aortic valve insufficiency    Arthritis    GERD (gastroesophageal reflux disease)    Hx of varicella    Hyperlipidemia    borderline   Hypertension    Hypothyroidism    Pneumonia 11/2014   Only time took inhalers to treat    RBBB 10/2019   recently seen at MD - f/u 6 mons, no treatment   Sacral back pain    Shingles 10/26/2010   Syncope and collapse 10/2019   Unclear etiology - just saw MD - f/u 6 months - no treatment   Thyroid disease    Past Surgical History:  Procedure Laterality Date   CESAREAN SECTION     x1   CHOLECYSTECTOMY     COLONOSCOPY  2011   tics, no polyps - recall 5 yr   ESOPHAGOGASTRODUODENOSCOPY (EGD) WITH PROPOFOL N/A 12/09/2015   Procedure: ESOPHAGOGASTRODUODENOSCOPY (EGD) WITH PROPOFOL;  Surgeon: Ruffin Frederick, MD;  Location: [facility];  Service: Gastroenterology;  Laterality: N/A;   EXPLORATORY LAPAROTOMY     Gallbladder   KIDNEY STONE SURGERY Left    POLYPECTOMY N/A 12/09/2015   Procedure: POLYPECTOMY;  Surgeon: Ruffin Frederick, MD;  Location: [facility];  Service: Gastroenterology;  Laterality: N/A;   TRANSTHORACIC ECHOCARDIOGRAM  10/07/2020   EF 60 to 65%.  Mild concentric LVH.  GRII DD.  Mild to moderate AI.  Normal PAP.  Normal RAP.  Normal RAP.  Stable AI   TUBAL LIGATION     WISDOM TOOTH EXTRACTION     Family History  Problem Relation Age of Onset   Heart disease Mother        stent in 56 s   High blood pressure Mother    High Cholesterol Mother    Transient ischemic attack Mother    Thyroid disease Mother    Anxiety disorder Mother    Sleep apnea Mother    COPD Father    Heart disease Father        cabg in 14s    Glaucoma Father    Dementia Father    Ulcerative colitis Sister    Ulcerative colitis Brother    Arrhythmia Daughter    Colon cancer Cousin    Colon polyps Neg Hx    Esophageal cancer Neg Hx    Rectal cancer Neg Hx    Stomach cancer Neg Hx    Social History   Socioeconomic History   Marital status: Married    Spouse name: Daphine Deutscher   Number of children: 1   Years of education: Not on file   Highest education level: Associate degree: academic program  Occupational History   Occupation: Retired  Tobacco Use   Smoking  status: Former    Current packs/day: 0.00    Types: Cigarettes    Quit date: 01/04/1982    Years since quitting: 40.5   Smokeless tobacco: Never   Tobacco comments:    Marijuana daily  Vaping Use   Vaping status: Former  Substance and Sexual Activity   Alcohol use: Yes    Alcohol/week: 14.0 standard drinks of alcohol  Types: 14 Standard drinks or equivalent per week    Comment: daily- 2 a night    Drug use: Yes    Types: Marijuana    Comment: Last use 10/15/19- Marijuana Daily   Sexual activity: Yes    Birth control/protection: Post-menopausal  Other Topics Concern   Not on file  Social History Narrative   Married, daughter in [place]- no grandkids- has grandpups   - no pets but dog sits   - Originally from [place], moved over 20 years ago      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      Hobbies: traveling, [place], dogsitting   Social Determinants of Health   Financial Resource Strain: Low Risk  (07/15/2022)   Overall Financial Resource Strain (CARDIA)    Difficulty of Paying Living Expenses: Not hard at all  Food Insecurity: No Food Insecurity (07/15/2022)   Hunger Vital Sign    Worried About Running Out of Food in the Last Year: Never true    Ran Out of Food in the Last Year: Never true  Transportation Needs: No Transportation Needs (07/15/2022)   PRAPARE - Administrator, Civil Service (Medical): No    Lack of Transportation (Non-Medical): No  Physical Activity: Sufficiently Active (07/15/2022)   Exercise Vital Sign    Days of Exercise per Week: 7 days    Minutes of Exercise per Session: 30 min  Stress: No Stress Concern Present (07/15/2022)   [employer] of Occupational Health - Occupational Stress Questionnaire    Feeling of Stress : Only a little  Social Connections: Moderately Integrated (07/15/2022)   Social Connection and Isolation Panel [NHANES]    Frequency of Communication with Friends and Family: More than three  times a week    Frequency of Social Gatherings with Friends and Family: More than three times a week    Attends Religious Services: Never    Database administrator or Organizations: Yes    Attends Engineer, structural: More than 4 times per year    Marital Status: Married    Tobacco Counseling Counseling given: Not Answered Tobacco comments: Marijuana daily   Clinical Intake:  Pre-visit preparation completed: Yes  Pain : No/denies pain     BMI - recorded: 28.34 Nutritional Status: BMI 25 -29 Overweight Nutritional Risks: None Diabetes: No  How often do you need to have someone help you when you read instructions, pamphlets, or other written materials from your doctor or pharmacy?: 1 - Never  Interpreter Needed?: No  Information entered by :: Lanier Ensign, LPN   Activities of Daily Living    07/15/2022    8:52 AM  In your present state of health, do you have any difficulty performing the following activities:  Hearing? 0  Vision? 0  Difficulty concentrating or making decisions? 0  Walking or climbing stairs? 0  Dressing or bathing? 0  Doing errands, shopping? 0  Preparing Food and eating ? N  Using the Toilet? N  In the past six months, have you accidently leaked urine? N  Do you have problems with loss of bowel control? N  Managing your Medications? N  Managing your Finances? N  Housekeeping or managing your Housekeeping? N    Patient Care Team: Shelva Majestic, MD as PCP - General (Family Medicine) Marykay Lex, MD as PCP - Cardiology (Cardiology)  Indicate any recent Medical Services you may have received from other than [facility] providers  in the past year (date may be approximate).     Assessment:   This is a routine wellness examination for Kip.  Hearing/Vision screen Hearing Screening - Comments:: Pt denies any hearing issues  Vision Screening - Comments:: Pt [facility] for [facility] exams   Dietary issues and  exercise activities discussed:     Goals Addressed             This Visit's Progress    Patient Stated       Get down to 155 lbs        Depression Screen    07/19/2022    8:07 AM 05/04/2021   10:05 AM 02/25/2021   11:06 AM 03/19/2020    2:20 PM 01/07/2020    2:27 PM 08/02/2018   10:47 AM 05/26/2016    2:27 PM  PHQ 2/9 Scores  PHQ - 2 Score 0 0 0 0 0 0 0  PHQ- 9 Score   2        Fall Risk    07/15/2022    8:52 AM 05/04/2021   10:05 AM 05/03/2021   10:34 AM 02/25/2021   11:06 AM 02/21/2021    9:16 AM  Fall Risk   Falls in the past year? 0 0 0 0 0  Number falls in past yr: 0 0 0 0   Injury with Fall? 0 0 0 0   Risk for fall due to : Impaired balance/gait;Impaired vision Medication side effect  No Fall Risks   Risk for fall due to: Comment taking classes for balance      Follow up Falls prevention discussed Falls evaluation completed;Education provided;Falls prevention discussed  Falls evaluation completed     MEDICARE RISK AT HOME:   TIMED UP AND GO:  Was the test performed?  No    Cognitive Function:        05/04/2021   10:06 AM  6CIT Screen  What Year? 0 points  What month? 0 points  What time? 0 points  Count back from 20 0 points  Months in reverse 0 points  Repeat phrase 0 points  Total Score 0 points    Immunizations Immunization History  Administered Date(s) Administered   COVID-19, mRNA, vaccine(Comirnaty)12 years and older 10/14/2021   Fluad Quad(high Dose 65+) 08/30/2018, 10/15/2020, 10/07/2021   Hep A / Hep B 02/28/2009, 03/28/2009   Hepatitis A 08/29/2009   Hepatitis B 07/31/2009   Influenza, High Dose Seasonal PF 10/31/2017, 10/07/2021   Influenza-Unspecified 10/02/2019, 10/02/2019, 10/15/2020, 10/07/2021   Meningococcal polysaccharide vaccine (MPSV4) 02/28/2009   Moderna SARS-COV2 Booster Vaccination 10/14/2021   PFIZER(Purple Top)SARS-COV-2 Vaccination 01/26/2019, 02/16/2019, 10/02/2019   Pfizer Covid-19 Vaccine Bivalent Booster 29yrs & up  10/15/2020   Pneumococcal Conjugate-13 08/02/2018   Pneumococcal Polysaccharide-23 08/21/2019   Td 01/04/2002, 02/28/2009   Tdap 11/10/2021   Zoster Recombinant(Shingrix) 03/25/2021, 07/14/2021    TDAP status: Up to date  Flu Vaccine status: Up to date  Pneumococcal vaccine status: Up to date  Covid-19 vaccine status: Completed vaccines  Qualifies for Shingles Vaccine? Yes   Zostavax completed Yes   Shingrix Completed?: Yes  Screening Tests Health Maintenance  Topic Date Due   COVID-19 Vaccine (5 - 2023-24 season) 12/09/2021   INFLUENZA VACCINE  08/05/2022   MAMMOGRAM  02/24/2023   Medicare Annual Wellness (AWV)  07/19/2023   Colonoscopy  10/28/2029   DTaP/Tdap/Td (4 - Td or Tdap) 11/11/2031   Pneumonia Vaccine 77+ Years old  Completed  DEXA SCAN  Completed   Hepatitis C Screening  Completed   Zoster Vaccines- Shingrix  Completed   HPV VACCINES  Aged Out    Health Maintenance  Health Maintenance Due  Topic Date Due   COVID-19 Vaccine (5 - 2023-24 season) 12/09/2021    Colorectal cancer screening: Type of screening: Colonoscopy. Completed 10/29/19. Repeat every 10 years  Mammogram status: Completed 02/23/22. Repeat every year  Bone Density status: Completed 02/19/19. Results reflect: Bone density results: OSTEOPENIA. Repeat every 2 years.   Additional Screening:  Hepatitis C Screening: Completed 11/09/21  Vision Screening: Recommended annual ophthalmology exams for early detection of glaucoma and other disorders of the eye. Is the patient up to date with their annual eye exam?  Yes  Who is the provider or what is the name of the office in which the patient attends annual eye exams? [facility]  If pt is not established with a provider, would they like to be referred to a provider to establish care? No .   Dental Screening: Recommended annual dental exams for proper oral hygiene Diabetic Foot Exam:   Community Resource Referral / Chronic Care  Management: CRR required this visit?  No   CCM required this visit?  No     Plan:     I have personally reviewed and noted the following in the patient's chart:   Medical and social history Use of alcohol, tobacco or illicit drugs  Current medications and supplements including opioid prescriptions. Patient is not currently taking opioid prescriptions. Functional ability and status Nutritional status Physical activity Advanced directives List of other physicians Hospitalizations, surgeries, and ER visits in previous 12 months Vitals Screenings to include cognitive, depression, and falls Referrals and appointments  In addition, I have reviewed and discussed with patient certain preventive protocols, quality metrics, and best practice recommendations. A written personalized care plan for preventive services as well as general preventive health recommendations were provided to patient.     Marzella Schlein, LPN   1/61/0960   After Visit Summary: (MyChart) Due to this being a telephonic visit, the after visit summary with patients personalized plan was offered to patient via MyChart   Nurse Notes: none

## 2022-07-19 NOTE — Patient Instructions (Signed)
[name] , Thank you for taking time to come for your Medicare Wellness Visit. I appreciate your ongoing commitment to your health goals. Please review the following plan we discussed and let me know if I can assist you in the future.   These are the goals we discussed:  Goals      Patient Stated     05/04/2021, lose weight     Patient Stated     Get down to 155 lbs         This is a list of the screening recommended for you and due dates:  Health Maintenance  Topic Date Due   COVID-19 Vaccine (5 - 2023-24 season) 12/09/2021   Flu Shot  08/05/2022   Mammogram  02/24/2023   Medicare Annual Wellness Visit  07/19/2023   Colon Cancer Screening  10/28/2029   DTaP/Tdap/Td vaccine (4 - Td or Tdap) 11/11/2031   Pneumonia Vaccine  Completed   DEXA scan (bone density measurement)  Completed   Hepatitis C Screening  Completed   Zoster (Shingles) Vaccine  Completed   HPV Vaccine  Aged Out    Advanced directives: Please bring a copy of your health care power of attorney and living will to the office at your convenience.  Conditions/risks identified: get weight to 155 lbs   Next appointment: Follow up in one year for your annual wellness visit    Preventive Care 65 Years and Older, Female Preventive care refers to lifestyle choices and visits with your health care provider that can promote health and wellness. What does preventive care include? A yearly physical exam. This is also called an annual well check. Dental exams once or twice a year. Routine eye exams. Ask your health care provider how often you should have your eyes checked. Personal lifestyle choices, including: Daily care of your teeth and gums. Regular physical activity. Eating a healthy diet. Avoiding tobacco and drug use. Limiting alcohol use. Practicing safe sex. Taking low-dose aspirin every day. Taking vitamin and mineral supplements as recommended by your health care provider. What happens during an annual  well check? The services and screenings done by your health care provider during your annual well check will depend on your age, overall health, lifestyle risk factors, and family history of disease. Counseling  Your health care provider may ask you questions about your: Alcohol use. Tobacco use. Drug use. Emotional well-being. Home and relationship well-being. Sexual activity. Eating habits. History of falls. Memory and ability to understand (cognition). Work and work Astronomer. Reproductive health. Screening  You may have the following tests or measurements: Height, weight, and BMI. Blood pressure. Lipid and cholesterol levels. These may be checked every 5 years, or more frequently if you are over 35 years old. Skin check. Lung cancer screening. You may have this screening every year starting at age 66 if you have a 30-pack-year history of smoking and currently smoke or have quit within the past 15 years. Fecal occult blood test (FOBT) of the stool. You may have this test every year starting at age 80. Flexible sigmoidoscopy or colonoscopy. You may have a sigmoidoscopy every 5 years or a colonoscopy every 10 years starting at age 41. Hepatitis C blood test. Hepatitis B blood test. Sexually transmitted disease (STD) testing. Diabetes screening. This is done by checking your blood sugar (glucose) after you have not eaten for a while (fasting). You may have this done every 1-3 years. Bone density scan. This is done to screen for osteoporosis. You may  have this done starting at age 57. Mammogram. This may be done every 1-2 years. Talk to your health care provider about how often you should have regular mammograms. Talk with your health care provider about your test results, treatment options, and if necessary, the need for more tests. Vaccines  Your health care provider may recommend certain vaccines, such as: Influenza vaccine. This is recommended every year. Tetanus, diphtheria,  and acellular pertussis (Tdap, Td) vaccine. You may need a Td booster every 10 years. Zoster vaccine. You may need this after age 78. Pneumococcal 13-valent conjugate (PCV13) vaccine. One dose is recommended after age 70. Pneumococcal polysaccharide (PPSV23) vaccine. One dose is recommended after age 45. Talk to your health care provider about which screenings and vaccines you need and how often you need them. This information is not intended to replace advice given to you by your health care provider. Make sure you discuss any questions you have with your health care provider. Document Released: 01/17/2015 Document Revised: 09/10/2015 Document Reviewed: 10/22/2014 Elsevier Interactive Patient Education  2017 [employer].  Fall Prevention in the Home Falls can cause injuries. They can happen to people of all ages. There are many things you can do to make your home safe and to help prevent falls. What can I do on the outside of my home? Regularly fix the edges of walkways and driveways and fix any cracks. Remove anything that might make you trip as you walk through a door, such as a raised step or threshold. Trim any bushes or trees on the path to your home. Use bright outdoor lighting. Clear any walking paths of anything that might make someone trip, such as rocks or tools. Regularly check to see if handrails are loose or broken. Make sure that both sides of any steps have handrails. Any raised decks and porches should have guardrails on the edges. Have any leaves, snow, or ice cleared regularly. Use sand or salt on walking paths during winter. Clean up any spills in your garage right away. This includes oil or grease spills. What can I do in the bathroom? Use night lights. Install grab bars by the toilet and in the tub and shower. Do not use towel bars as grab bars. Use non-skid mats or decals in the tub or shower. If you need to sit down in the shower, use a plastic, non-slip  stool. Keep the floor dry. Clean up any water that spills on the floor as soon as it happens. Remove soap buildup in the tub or shower regularly. Attach bath mats securely with double-sided non-slip rug tape. Do not have throw rugs and other things on the floor that can make you trip. What can I do in the bedroom? Use night lights. Make sure that you have a light by your bed that is easy to reach. Do not use any sheets or blankets that are too big for your bed. They should not hang down onto the floor. Have a firm chair that has side arms. You can use this for support while you get dressed. Do not have throw rugs and other things on the floor that can make you trip. What can I do in the kitchen? Clean up any spills right away. Avoid walking on wet floors. Keep items that you use a lot in easy-to-reach places. If you need to reach something above you, use a strong step stool that has a grab bar. Keep electrical cords out of the way. Do not use floor polish  or wax that makes floors slippery. If you must use wax, use non-skid floor wax. Do not have throw rugs and other things on the floor that can make you trip. What can I do with my stairs? Do not leave any items on the stairs. Make sure that there are handrails on both sides of the stairs and use them. Fix handrails that are broken or loose. Make sure that handrails are as long as the stairways. Check any carpeting to make sure that it is firmly attached to the stairs. Fix any carpet that is loose or worn. Avoid having throw rugs at the top or bottom of the stairs. If you do have throw rugs, attach them to the floor with carpet tape. Make sure that you have a light switch at the top of the stairs and the bottom of the stairs. If you do not have them, ask someone to add them for you. What else can I do to help prevent falls? Wear shoes that: Do not have high heels. Have rubber bottoms. Are comfortable and fit you well. Are closed at the  toe. Do not wear sandals. If you use a stepladder: Make sure that it is fully opened. Do not climb a closed stepladder. Make sure that both sides of the stepladder are locked into place. Ask someone to hold it for you, if possible. Clearly mark and make sure that you can see: Any grab bars or handrails. First and last steps. Where the edge of each step is. Use tools that help you move around (mobility aids) if they are needed. These include: Canes. Walkers. Scooters. Crutches. Turn on the lights when you go into a dark area. Replace any light bulbs as soon as they burn out. Set up your furniture so you have a clear path. Avoid moving your furniture around. If any of your floors are uneven, fix them. If there are any pets around you, be aware of where they are. Review your medicines with your doctor. Some medicines can make you feel dizzy. This can increase your chance of falling. Ask your doctor what other things that you can do to help prevent falls. This information is not intended to replace advice given to you by your health care provider. Make sure you discuss any questions you have with your health care provider. Document Released: 10/17/2008 Document Revised: 05/29/2015 Document Reviewed: 01/25/2014 Elsevier Interactive Patient Education  2017 [employer].

## 2022-07-20 NOTE — Progress Notes (Signed)
Subjective:   Jillian Escobar is a 72 y.o. female who presents for Medicare Annual (Subsequent) preventive examination.  Per patient no change in vitals since last visit, unable to obtain new vitals due to telehealth visit   Visit Complete: Virtual  I connected with  Jillian Escobar on 07/20/22 by a audio enabled telemedicine application and verified that I am speaking with the correct person using two identifiers.  Patient Location: Home  Provider Location: Office/Clinic  I discussed the limitations of evaluation and management by telemedicine. The patient expressed understanding and agreed to proceed.  Patient Medicare AWV questionnaire was completed by the patient on 07/15/22; I have confirmed that all information answered by patient is correct and no changes since this date.  Review of Systems     Cardiac Risk Factors include: advanced age (>73men, >3 women);hypertension;dyslipidemia     Objective:    Today's Vitals   07/19/22 0801  Weight: 160 lb (72.6 kg)   Body mass index is 28.34 kg/m.     07/19/2022    8:09 AM 06/03/2022   12:24 PM 04/13/2022    5:33 AM 05/04/2021   10:04 AM 03/19/2020    2:17 PM 10/17/2017    9:27 AM 12/09/2015    6:42 AM  Advanced Directives  Does Patient Have a Medical Advance Directive? Yes Yes No Yes Yes Yes Yes  Type of Estate agent of [place];Living will   Healthcare Power of [place];Living will Healthcare Power of [place];Living will Living will;Healthcare Power of [employer] Power of [place];Living will  Does patient want to make changes to medical advance directive?  No - Patient declined   No - Patient declined    Copy of Healthcare Power of Attorney in Chart? No - copy requested   No - copy requested No - copy requested No - copy requested Yes  Would patient like information on creating a medical advance directive?   No - Patient declined        Current Medications (verified) Outpatient Encounter  Medications as of 07/19/2022  Medication Sig   albuterol (VENTOLIN HFA) 108 (90 Base) MCG/ACT inhaler TAKE 2 PUFFS BY MOUTH EVERY 6 HOURS AS NEEDED FOR WHEEZE OR SHORTNESS OF BREATH   atorvastatin (LIPITOR) 20 MG tablet TAKE 1 TABLET BY MOUTH EVERY DAY   B Complex Vitamins (B-COMPLEX/B-12 PO) Take by mouth. Unknown dosage   calcium carbonate (OS-CAL) 600 MG TABS tablet    celecoxib (CELEBREX) 200 MG capsule Take 1 capsule (200 mg total) by mouth daily.   Cholecalciferol (VITAMIN D PO) Take 2 tablets by mouth daily.   ELDERBERRY PO daily.    famotidine (PEPCID) 20 MG tablet Take 20 mg by mouth as needed for heartburn or indigestion.   hydrochlorothiazide (MICROZIDE) 12.5 MG capsule TAKE 1 CAPSULE BY MOUTH EVERY DAY   KRILL OIL PO Take by mouth in the morning and at bedtime.   levothyroxine (SYNTHROID) 50 MCG tablet TAKE 1 TABLET BY MOUTH EVERY DAY   losartan (COZAAR) 25 MG tablet TAKE 1 TABLET (25 MG TOTAL) BY MOUTH DAILY. (Patient taking differently: Take 12.5 mg by mouth daily.)   Misc Natural Products (OSTEO BI-FLEX/5-LOXIN ADVANCED PO) Take by mouth 2 (two) times daily at 8 am and 10 pm.   Multiple Vitamin (MULTIVITAMIN WITH MINERALS) TABS tablet Take 1 tablet by mouth daily.    OVER THE COUNTER MEDICATION Take 1 capsule by mouth daily. CBD oil 2500 mg   TART CHERRY PO Take 1 tablet  by mouth daily.   Turmeric 500 MG TABS Take 1 tablet by mouth 2 (two) times daily.   vitamin A 3 MG (10000 UNITS) capsule Take 10,000 Units by mouth daily.   [DISCONTINUED] Ibuprofen-Famotidine (DUEXIS) 800-26.6 MG TABS Take 1 tablet by mouth daily as needed (do not take on day you take celebrex).   No facility-administered encounter medications on file as of 07/19/2022.    Allergies (verified) Levaquin [levofloxacin]   History: Past Medical History:  Diagnosis Date   Allergy    Aortic valve insufficiency    Arthritis    GERD (gastroesophageal reflux disease)    Hx of varicella    Hyperlipidemia     borderline   Hypertension    Hypothyroidism    Pneumonia 11/2014   Only time took inhalers to treat   RBBB 10/2019   [facility]ly seen at [facility] - f/u 6 mons, no treatment   Sacral back pain    Shingles 10/26/2010   Syncope and collapse 10/2019   Unclear etiology - just saw MD - f/u 6 months - no treatment   Thyroid disease    Past Surgical History:  Procedure Laterality Date   CESAREAN SECTION     x1   CHOLECYSTECTOMY     COLONOSCOPY  2011   tics, no polyps - recall 5 yr   ESOPHAGOGASTRODUODENOSCOPY (EGD) WITH PROPOFOL N/A 12/09/2015   Procedure: ESOPHAGOGASTRODUODENOSCOPY (EGD) WITH PROPOFOL;  Surgeon: Ruffin Frederick, MD;  Location: [facility];  Service: Gastroenterology;  Laterality: N/A;   EXPLORATORY LAPAROTOMY     Gallbladder   KIDNEY STONE SURGERY Left    POLYPECTOMY N/A 12/09/2015   Procedure: POLYPECTOMY;  Surgeon: Ruffin Frederick, MD;  Location: [facility];  Service: Gastroenterology;  Laterality: N/A;   TRANSTHORACIC ECHOCARDIOGRAM  10/07/2020   EF 60 to 65%.  Mild concentric LVH.  GRII DD.  Mild to moderate AI.  Normal PAP.  Normal RAP.  Normal RAP.  Stable AI   TUBAL LIGATION     WISDOM TOOTH EXTRACTION     Family History  Problem Relation Age of Onset   Heart disease Mother        stent in 91 s   High blood pressure Mother    High Cholesterol Mother    Transient ischemic attack Mother    Thyroid disease Mother    Anxiety disorder Mother    Sleep apnea Mother    COPD Father    Heart disease Father        cabg in 41s    Glaucoma Father    Dementia Father    Ulcerative colitis Sister    Ulcerative colitis Brother    Arrhythmia Daughter    Colon cancer Cousin    Colon polyps Neg Hx    Esophageal cancer Neg Hx    Rectal cancer Neg Hx    Stomach cancer Neg Hx    Social History   Socioeconomic History   Marital status: Married    Spouse name: Jillian Escobar   Number of children: 1   Years of education: Not on file   Highest education level:  Associate degree: academic program  Occupational History   Occupation: Retired  Tobacco Use   Smoking status: Former    Current packs/day: 0.00    Types: Cigarettes    Quit date: 01/04/1982    Years since quitting: 40.5   Smokeless tobacco: Never   Tobacco comments:    Marijuana daily  Vaping Use   Vaping status: Former  Substance and Sexual Activity   Alcohol use: Yes    Alcohol/week: 14.0 standard drinks of alcohol    Types: 14 Standard drinks or equivalent per week    Comment: daily- 2 a night    Drug use: Yes    Types: Marijuana    Comment: Last use 10/15/19- Marijuana Daily   Sexual activity: Yes    Birth control/protection: Post-menopausal  Other Topics Concern   Not on file  Social History Narrative   Married, daughter in [place]- no grandkids- has grandpups   - no pets but dog sits   - Originally from [place], moved over 20 years ago      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      Hobbies: traveling, [place], dogsitting   Social Determinants of Health   Financial Resource Strain: Low Risk  (07/15/2022)   Overall Financial Resource Strain (CARDIA)    Difficulty of Paying Living Expenses: Not hard at all  Food Insecurity: No Food Insecurity (07/15/2022)   Hunger Vital Sign    Worried About Running Out of Food in the Last Year: Never true    Ran Out of Food in the Last Year: Never true  Transportation Needs: No Transportation Needs (07/15/2022)   PRAPARE - Administrator, Civil Service (Medical): No    Lack of Transportation (Non-Medical): No  Physical Activity: Sufficiently Active (07/15/2022)   Exercise Vital Sign    Days of Exercise per Week: 7 days    Minutes of Exercise per Session: 30 min  Stress: No Stress Concern Present (07/15/2022)   [employer] of Occupational Health - Occupational Stress Questionnaire    Feeling of Stress : Only a little  Social Connections: Moderately Integrated (07/15/2022)   Social  Connection and Isolation Panel [NHANES]    Frequency of Communication with Friends and Family: More than three times a week    Frequency of Social Gatherings with Friends and Family: More than three times a week    Attends Religious Services: Never    Database administrator or Organizations: Yes    Attends Engineer, structural: More than 4 times per year    Marital Status: Married    Tobacco Counseling Counseling given: Not Answered Tobacco comments: Marijuana daily   Clinical Intake:  Pre-visit preparation completed: Yes  Pain : No/denies pain     BMI - recorded: 28.34 Nutritional Status: BMI 25 -29 Overweight Nutritional Risks: None Diabetes: No  How often do you need to have someone help you when you read instructions, pamphlets, or other written materials from your doctor or pharmacy?: 1 - Never  Interpreter Needed?: No  Information entered by :: Lanier Ensign, LPN   Activities of Daily Living    07/15/2022    8:52 AM  In your present state of health, do you have any difficulty performing the following activities:  Hearing? 0  Vision? 0  Difficulty concentrating or making decisions? 0  Walking or climbing stairs? 0  Dressing or bathing? 0  Doing errands, shopping? 0  Preparing Food and eating ? N  Using the Toilet? N  In the past six months, have you accidently leaked urine? N  Do you have problems with loss of bowel control? N  Managing your Medications? N  Managing your Finances? N  Housekeeping or managing your Housekeeping? N    Patient Care Team: Shelva Majestic, MD as PCP - General (Family Medicine) Marykay Lex,  MD as PCP - Cardiology (Cardiology)  Indicate any recent Medical Services you may have received from other than [facility] providers in the past year (date may be approximate).     Assessment:   This is a routine wellness examination for [place].  Hearing/Vision screen Hearing Screening - Comments:: Pt denies any hearing  issues  Vision Screening - Comments:: Pt [facility] for [facility] exams   Dietary issues and exercise activities discussed:     Goals Addressed             This Visit's Progress    Patient Stated       Get down to 155 lbs        Depression Screen    07/19/2022    8:07 AM 05/04/2021   10:05 AM 02/25/2021   11:06 AM 03/19/2020    2:20 PM 01/07/2020    2:27 PM 08/02/2018   10:47 AM 05/26/2016    2:27 PM  PHQ 2/9 Scores  PHQ - 2 Score 0 0 0 0 0 0 0  PHQ- 9 Score   2        Fall Risk    07/15/2022    8:52 AM 05/04/2021   10:05 AM 05/03/2021   10:34 AM 02/25/2021   11:06 AM 02/21/2021    9:16 AM  Fall Risk   Falls in the past year? 0 0 0 0 0  Number falls in past yr: 0 0 0 0   Injury with Fall? 0 0 0 0   Risk for fall due to : Impaired balance/gait;Impaired vision Medication side effect  No Fall Risks   Risk for fall due to: Comment taking classes for balance      Follow up Falls prevention discussed Falls evaluation completed;Education provided;Falls prevention discussed  Falls evaluation completed     MEDICARE RISK AT HOME:   TIMED UP AND GO:  Was the test performed?  No    Cognitive Function:Declined         05/04/2021   10:06 AM  6CIT Screen  What Year? 0 points  What month? 0 points  What time? 0 points  Count back from 20 0 points  Months in reverse 0 points  Repeat phrase 0 points  Total Score 0 points    Immunizations Immunization History  Administered Date(s) Administered   COVID-19, mRNA, vaccine(Comirnaty)12 years and older 10/14/2021   Fluad Quad(high Dose 65+) 08/30/2018, 10/15/2020, 10/07/2021   Hep A / Hep B 02/28/2009, 03/28/2009   Hepatitis A 08/29/2009   Hepatitis B 07/31/2009   Influenza, High Dose Seasonal PF 10/31/2017, 10/07/2021   Influenza-Unspecified 10/02/2019, 10/02/2019, 10/15/2020, 10/07/2021   Meningococcal polysaccharide vaccine (MPSV4) 02/28/2009   Moderna SARS-COV2 Booster Vaccination 10/14/2021    PFIZER(Purple Top)SARS-COV-2 Vaccination 01/26/2019, 02/16/2019, 10/02/2019   Pfizer Covid-19 Vaccine Bivalent Booster 74yrs & up 10/15/2020   Pneumococcal Conjugate-13 08/02/2018   Pneumococcal Polysaccharide-23 08/21/2019   Td 01/04/2002, 02/28/2009   Tdap 11/10/2021   Zoster Recombinant(Shingrix) 03/25/2021, 07/14/2021    TDAP status: Up to date  Flu Vaccine status: Up to date  Pneumococcal vaccine status: Up to date  Covid-19 vaccine status: Completed vaccines  Qualifies for Shingles Vaccine? Yes   Zostavax completed Yes   Shingrix Completed?: Yes  Screening Tests Health Maintenance  Topic Date Due   COVID-19 Vaccine (5 - 2023-24 season) 12/09/2021   INFLUENZA VACCINE  08/05/2022   MAMMOGRAM  02/24/2023   Medicare Annual Wellness (AWV)  07/19/2023   Colonoscopy  10/28/2029   DTaP/Tdap/Td (4 - Td or Tdap) 11/11/2031   Pneumonia Vaccine 48+ Years old  Completed   DEXA SCAN  Completed   Hepatitis C Screening  Completed   Zoster Vaccines- Shingrix  Completed   HPV VACCINES  Aged Out    Health Maintenance  Health Maintenance Due  Topic Date Due   COVID-19 Vaccine (5 - 2023-24 season) 12/09/2021    Colorectal cancer screening: Type of screening: Colonoscopy. Completed 10/29/19. Repeat every 10 years  Mammogram status: Completed 02/23/22. Repeat every year  Bone Density status: Completed 02/19/19. Results reflect: Bone density results: OSTEOPENIA. Repeat every 2 years.   Additional Screening:  Hepatitis C Screening: Completed 11/09/21  Vision Screening: Recommended annual ophthalmology exams for early detection of glaucoma and other disorders of the eye. Is the patient up to date with their annual eye exam?  Yes  Who is the provider or what is the name of the office in which the patient attends annual eye exams? [facility]  If pt is not established with a provider, would they like to be referred to a provider to establish care? No .   Dental Screening:  Recommended annual dental exams for proper oral hygiene Diabetic Foot Exam:   Community Resource Referral / Chronic Care Management: CRR required this visit?  No   CCM required this visit?  No     Plan:     I have personally reviewed and noted the following in the patient's chart:   Medical and social history Use of alcohol, tobacco or illicit drugs  Current medications and supplements including opioid prescriptions. Patient is not currently taking opioid prescriptions. Functional ability and status Nutritional status Physical activity Advanced directives List of other physicians Hospitalizations, surgeries, and ER visits in previous 12 months Vitals Screenings to include cognitive, depression, and falls Referrals and appointments  In addition, I have reviewed and discussed with patient certain preventive protocols, quality metrics, and best practice recommendations. A written personalized care plan for preventive services as well as general preventive health recommendations were provided to patient.     Marzella Schlein, LPN   09/02/5619   After Visit Summary: (MyChart) Due to this being a telephonic visit, the after visit summary with patients personalized plan was offered to patient via MyChart   Nurse Notes: Pt declined cognition testing pt was alert and knowledgeable to questions asked

## 2022-07-22 ENCOUNTER — Ambulatory Visit: Payer: Medicare Other | Admitting: Physical Therapy

## 2022-08-02 ENCOUNTER — Ambulatory Visit (INDEPENDENT_AMBULATORY_CARE_PROVIDER_SITE_OTHER): Payer: Medicare Other | Admitting: Family Medicine

## 2022-08-04 ENCOUNTER — Other Ambulatory Visit: Payer: Self-pay | Admitting: Cardiology

## 2022-08-16 ENCOUNTER — Ambulatory Visit: Payer: Medicare Other | Admitting: Family Medicine

## 2022-08-24 NOTE — Progress Notes (Unsigned)
[name] [address] [place] [address] Phone: [phone] Subjective:   [name], am serving as a scribe for Dr. Antoine Primas.  I'm seeing this patient by the request  of:  Shelva Majestic, MD  CC: back and neck pain   [id number]  Jillian Escobar is a 72 y.o. female coming in with complaint of back and neck pain. OMT on 07/01/2022. Patient states that she has been doing well since last visit.   Medications patient has been prescribed:   Taking:         Reviewed prior external information including notes and imaging from previsou exam, outside providers and external EMR if available.   As well as notes that were available from care everywhere and other healthcare systems.  Past medical history, social, surgical and family history all reviewed in electronic medical record.  No pertanent information unless stated regarding to the chief complaint.   Past Medical History:  Diagnosis Date   Allergy    Aortic valve insufficiency    Arthritis    GERD (gastroesophageal reflux disease)    Hx of varicella    Hyperlipidemia    borderline   Hypertension    Hypothyroidism    Pneumonia 11/2014   Only time took inhalers to treat   RBBB 10/2019   [facility] seen at [facility] - f/u 6 mons, no treatment   Sacral back pain    Shingles 10/26/2010   Syncope and collapse 10/2019   Unclear etiology - just saw MD - f/u 6 months - no treatment   Thyroid disease     Allergies  Allergen Reactions   Levaquin [Levofloxacin]     See notes  September 2019  Palpitations irritability sleep issues       Review of Systems:  No headache, visual changes, nausea, vomiting, diarrhea, constipation, dizziness, abdominal pain, skin rash, fevers, chills, night sweats, weight loss, swollen lymph nodes, body aches, joint swelling, chest pain, shortness of breath, mood changes. POSITIVE muscle aches  Objective  There were no vitals taken for this  visit.   General: No apparent distress alert and oriented x3 mood and affect normal, dressed appropriately.  HEENT: Pupils equal, extraocular movements intact  Respiratory: Patient's speak in full sentences and does not appear short of breath  Cardiovascular: No lower extremity edema, non tender, no erythema  Gait MSK:  Back   Osteopathic findings  C2 flexed rotated and side bent right C6 flexed rotated and side bent left T3 extended rotated and side bent right inhaled rib T9 extended rotated and side bent left L2 flexed rotated and side bent right Sacrum right on right       Assessment and Plan:  No problem-specific Assessment & Plan notes found for this encounter.    Nonallopathic problems  Decision today to treat with OMT was based on Physical Exam  After verbal consent patient was treated with HVLA, ME, FPR techniques in cervical, rib, thoracic, lumbar, and sacral  areas  Patient tolerated the procedure well with improvement in symptoms  Patient given exercises, stretches and lifestyle modifications  See medications in patient instructions if given  Patient will follow up in 4-8 weeks    The above documentation has been reviewed and is accurate and complete Judi Saa, DO          Note: This dictation was prepared with Dragon dictation along with smaller phrase technology. Any transcriptional errors that result from this process are  unintentional.

## 2022-08-25 ENCOUNTER — Ambulatory Visit (INDEPENDENT_AMBULATORY_CARE_PROVIDER_SITE_OTHER): Payer: Medicare Other | Admitting: Family Medicine

## 2022-08-25 VITALS — BP 112/74 | HR 74 | Ht 63.0 in | Wt 160.0 lb

## 2022-08-25 DIAGNOSIS — M9908 Segmental and somatic dysfunction of rib cage: Secondary | ICD-10-CM | POA: Diagnosis not present

## 2022-08-25 DIAGNOSIS — M5136 Other intervertebral disc degeneration, lumbar region: Secondary | ICD-10-CM | POA: Diagnosis not present

## 2022-08-25 DIAGNOSIS — M9902 Segmental and somatic dysfunction of thoracic region: Secondary | ICD-10-CM | POA: Diagnosis not present

## 2022-08-25 DIAGNOSIS — M9904 Segmental and somatic dysfunction of sacral region: Secondary | ICD-10-CM

## 2022-08-25 DIAGNOSIS — M51369 Other intervertebral disc degeneration, lumbar region without mention of lumbar back pain or lower extremity pain: Secondary | ICD-10-CM

## 2022-08-25 DIAGNOSIS — M9901 Segmental and somatic dysfunction of cervical region: Secondary | ICD-10-CM | POA: Diagnosis not present

## 2022-08-25 DIAGNOSIS — M9903 Segmental and somatic dysfunction of lumbar region: Secondary | ICD-10-CM

## 2022-08-25 NOTE — Patient Instructions (Signed)
Good to see you Jillian Escobar [facility] is lucky See me in 8 weeks

## 2022-08-26 ENCOUNTER — Encounter: Payer: Self-pay | Admitting: Family Medicine

## 2022-08-26 NOTE — Assessment & Plan Note (Signed)
Still responding extremely well to osteopathic manipulation.  Patient is continuing to stay very active.  Discussed with patient that if worsening symptoms cans consider other treatment options.  Very optimistic that patient will continue to do well.  Hopefully when patient is back into her routine will do a little more strengthening of the core.  Follow-up with me again in 6 to 8 weeks otherwise

## 2022-09-13 ENCOUNTER — Ambulatory Visit (INDEPENDENT_AMBULATORY_CARE_PROVIDER_SITE_OTHER): Payer: Medicare Other | Admitting: Family Medicine

## 2022-09-13 ENCOUNTER — Encounter (INDEPENDENT_AMBULATORY_CARE_PROVIDER_SITE_OTHER): Payer: Self-pay | Admitting: Family Medicine

## 2022-09-13 VITALS — BP 126/70 | HR 59 | Temp 97.8°F | Ht 63.0 in | Wt 160.0 lb

## 2022-09-13 DIAGNOSIS — Z6830 Body mass index (BMI) 30.0-30.9, adult: Secondary | ICD-10-CM

## 2022-09-13 DIAGNOSIS — I10 Essential (primary) hypertension: Secondary | ICD-10-CM | POA: Diagnosis not present

## 2022-09-13 DIAGNOSIS — E88819 Insulin resistance, unspecified: Secondary | ICD-10-CM

## 2022-09-13 DIAGNOSIS — Z87891 Personal history of nicotine dependence: Secondary | ICD-10-CM

## 2022-09-13 DIAGNOSIS — E669 Obesity, unspecified: Secondary | ICD-10-CM

## 2022-09-13 NOTE — Progress Notes (Signed)
Jillian Escobar, D.O.  ABFM, ABOM Specializing in Clinical Bariatric Medicine  Office located at: [address] [place], [place]  [address]     Assessment and Plan:    Insulin resistance Assessment: Condition is Not at goal. This is diet/exercise controlled. She endorses having hunger and cravings over the summer as she ate off meal plan. She was not protein focused during her meal choices.  Lab Results  Component Value Date   HGBA1C 5.5 03/18/2022   HGBA1C 5.4 07/26/2018   HGBA1C 5.3 02/07/2018   INSULIN 14.5 03/18/2022    Plan: -  Restart her prudent nutritional plan that is low in simple carbohydrates, saturated fats and trans fats to goal of 5-10% weight loss to achieve significant health benefits.  Pt encouraged to continually advance exercise and cardiovascular fitness as tolerated throughout weight loss journey.  - In addition, we discussed the risks and benefits of various medication options which can help [place] in the management of this disease process as well as with weight loss.  Will consider starting one of these meds in future as we will focus on prudent nutritional plan at this time. She denies wanting to start at this moment.   - Continue to decrease simple carbs/ sugars; increase fiber and proteins -> follow her meal plan.    - Anticipatory guidance given.    - We will recheck A1c and fasting insulin level in approximately 3 months from last check, or as deemed appropriate.    Essential hypertension Assessment: Condition is Controlled. This is well controlled with Cozaar daily. She denies any light headedness or dizziness. She tolerates these medications well and denies any adverse side effects.  Last 3 blood pressure readings in our office are as follows: BP Readings from Last 3 Encounters:  09/13/22 126/70  08/25/22 112/74  07/01/22 110/76   Plan: - Continue Cozaar 25mg  daily.   - Jillian Escobar BP is 126/70 at goal today.   Lifestyle changes such  as following our low salt, heart healthy meal plan and engaging in a regular exercise program discussed   - Avoid buying foods that are: processed, frozen, or prepackaged to avoid excess salt.  - We will continue to monitor symptoms as they relate to the her weight loss journey.   TREATMENT PLAN FOR OBESITY: Obesity, with starting BMI of 30.11 Assessment:  Jillian Escobar is here to discuss her progress with her obesity treatment plan along with follow-up of her obesity related diagnoses. See Medical Weight Management Flowsheet for complete bioelectrical impedance results.  Condition is not optimized. Biometric data collected today, was reviewed with patient.   Since last office visit on 06/07/2022 patient's  Muscle mass has decreased by 0.4lb. Fat mass has increased by 1.4lb. Total body water has been stable. Counseling done on how various foods will affect these numbers and how to maximize success  Total lbs lost to date: 10 Total weight loss percentage to date: 5.88%   Plan: - Jillian Escobar will work on healthier eating habits and follow the Category 1 Plan and keeping a food journal and adhering to recommended goals of [phone] calories and 80+ protein the best they can.   Behavioral Intervention Additional resources provided today:  food journal log Evidence-based interventions for health behavior change were utilized today including the discussion of self monitoring techniques, problem-solving barriers and SMART goal setting techniques.   Regarding patient's less desirable eating habits and patterns, we employed the technique of small changes.  Pt will  specifically work on: Journal and measure her intake for next visit.     She has agreed to Continue current level of physical activity    FOLLOW UP: Return in about 22 days (around 10/05/2022).  She was informed of the importance of frequent follow up visits to maximize her success with intensive lifestyle modifications for her multiple  health conditions.  Subjective:   Chief complaint: Obesity Jillian Escobar is here to discuss her progress with her obesity treatment plan. She is on the the Category 1 Plan and keeping a food journal and adhering to recommended goals of [phone] calories and 80+ protein and states she is following her eating plan approximately 50% of the time. She states she is swimming 30 minutes 7 days per week and going to exercise class 45 minutes 2 days a week.  Interval History:  Jillian Escobar is here for a follow up office visit.     Since last office visit:  She has traveled for the past 2 months this summer from [place], [place], [place], and the [facility] and was last seen on 06/07/2022. She notes walking a great deal but was slacking on her mindful choices she did a lot of drinking and her eating off plan ranged from slightly eating off plan to completley eating off plan. She denies focusing on eating protein. But she will try to get back on plan since being back. She occasionally tracked her food intake on a app on her phone.    We reviewed her meal plan and all questions were answered.    Review of Systems:  Pertinent positives were addressed with patient today.  Reviewed by clinician on day of visit: allergies, medications, problem list, medical history, surgical history, family history, social history, and previous encounter notes.  Weight Summary and Biometrics   Weight Lost Since Last Visit: 0lb  Weight Gained Since Last Visit: 1lb   Vitals Temp: 97.8 F (36.6 C) BP: 126/70 Pulse Rate: (!) 59 SpO2: 100 %   Anthropometric Measurements Height: 5\' 3"  (1.6 m) Weight: 160 lb (72.6 kg) BMI (Calculated): 28.35 Weight at Last Visit: 159lb Weight Lost Since Last Visit: 0lb Weight Gained Since Last Visit: 1lb Starting Weight: 170lb Total Weight Loss (lbs): 10 lb (4.536 kg) Peak Weight: 197lb   Body Composition  Body Fat %: 38.8 % Fat Mass (lbs): 62.2 lbs Muscle Mass (lbs):  93.2 lbs Total Body Water (lbs): 68.8 lbs Visceral Fat Rating : 10   Other Clinical Data Fasting: no Labs: no Today's Visit #: 6 Starting Date: 03/18/22     Objective:   PHYSICAL EXAM: Blood pressure 126/70, pulse (!) 59, temperature 97.8 F (36.6 C), height 5\' 3"  (1.6 m), weight 160 lb (72.6 kg), SpO2 100%. Body mass index is 28.34 kg/m.  General: Well Developed, well nourished, and in no acute distress.  HEENT: Normocephalic, atraumatic Skin: Warm and dry, cap RF less 2 sec, good turgor Chest:  Normal excursion, shape, no gross abn Respiratory: speaking in full sentences, no conversational dyspnea NeuroM-Sk: Ambulates w/o assistance, moves * 4 Psych: A and O *3, insight good, mood-full  DIAGNOSTIC DATA REVIEWED:  BMET    Component Value Date/Time   NA 139 04/13/2022 0543   NA 140 03/18/2022 0847   K 3.6 04/13/2022 0543   CL 105 04/13/2022 0543   CO2 25 04/13/2022 0543   GLUCOSE 114 (H) 04/13/2022 0543   BUN 20 04/13/2022 0543   BUN 14 03/18/2022 0847   CREATININE 0.84 04/13/2022  0543   CALCIUM 9.9 04/13/2022 0543   GFRNONAA >60 04/13/2022 0543   GFRAA 73 04/04/2019 1234   Lab Results  Component Value Date   HGBA1C 5.5 03/18/2022   HGBA1C 5.3 02/07/2018   Lab Results  Component Value Date   INSULIN 14.5 03/18/2022   Lab Results  Component Value Date   TSH 3.780 03/18/2022   CBC    Component Value Date/Time   WBC 5.3 04/13/2022 0543   RBC 4.92 04/13/2022 0543   HGB 15.0 04/13/2022 0543   HGB 14.8 03/18/2022 0847   HCT 43.3 04/13/2022 0543   HCT 43.3 03/18/2022 0847   PLT 333 04/13/2022 0543   PLT 300 03/18/2022 0847   MCV 88.0 04/13/2022 0543   MCV 91 03/18/2022 0847   MCH 30.5 04/13/2022 0543   MCHC 34.6 04/13/2022 0543   RDW 12.9 04/13/2022 0543   RDW 12.6 03/18/2022 0847   Iron Studies No results found for: "IRON", "TIBC", "FERRITIN", "IRONPCTSAT" Lipid Panel     Component Value Date/Time   CHOL 152 03/18/2022 0847   TRIG 77  03/18/2022 0847   HDL 60 03/18/2022 0847   CHOLHDL 2.8 07/14/2021 0837   CHOLHDL 4.1 08/21/2019 0915   VLDL 18.4 07/26/2018 0803   LDLCALC 77 03/18/2022 0847   LDLCALC 133 (H) 08/21/2019 0915   LDLDIRECT 158.4 08/14/2012 0828   Hepatic Function Panel     Component Value Date/Time   PROT 6.9 03/18/2022 0847   ALBUMIN 4.5 03/18/2022 0847   AST 21 03/18/2022 0847   ALT 16 03/18/2022 0847   ALKPHOS 84 03/18/2022 0847   BILITOT 1.0 03/18/2022 0847   BILIDIR 0.25 03/10/2020 0935   IBILI 0.8 08/21/2019 0915      Component Value Date/Time   TSH 3.780 03/18/2022 0847   Nutritional Lab Results  Component Value Date   VD25OH 58.7 03/18/2022    Attestations:   I, Clinical biochemist, acting as a Stage manager for [employer], DO., have compiled all relevant documentation for today's office visit on behalf of Thomasene Lot, DO, while in the presence of [employer], DO.  I have reviewed the above documentation for accuracy and completeness, and I agree with the above. Jillian Escobar, D.O.  The 21st Century Cures Act was signed into law in 2016 which includes the topic of electronic health records.  This provides immediate access to information in MyChart.  This includes consultation notes, operative notes, office notes, lab results and pathology reports.  If you have any questions about what you read please let us know at your next visit so we can discuss your concerns and take corrective action if need be.  We are right here with you.

## 2022-09-15 ENCOUNTER — Other Ambulatory Visit (HOSPITAL_BASED_OUTPATIENT_CLINIC_OR_DEPARTMENT_OTHER): Payer: Self-pay

## 2022-09-15 MED ORDER — COMIRNATY 30 MCG/0.3ML IM SUSY
0.3000 mL | PREFILLED_SYRINGE | Freq: Once | INTRAMUSCULAR | 0 refills | Status: AC
  Filled 2022-09-15: qty 0.3, 1d supply, fill #0

## 2022-09-15 MED ORDER — FLUAD 0.5 ML IM SUSY
0.5000 mL | PREFILLED_SYRINGE | Freq: Once | INTRAMUSCULAR | 0 refills | Status: AC
  Filled 2022-09-15: qty 0.5, 1d supply, fill #0

## 2022-10-05 ENCOUNTER — Encounter (INDEPENDENT_AMBULATORY_CARE_PROVIDER_SITE_OTHER): Payer: Self-pay | Admitting: Family Medicine

## 2022-10-05 ENCOUNTER — Ambulatory Visit (INDEPENDENT_AMBULATORY_CARE_PROVIDER_SITE_OTHER): Payer: Medicare Other | Admitting: Family Medicine

## 2022-10-05 VITALS — BP 123/78 | HR 66 | Temp 98.2°F | Ht 63.0 in | Wt 158.0 lb

## 2022-10-05 DIAGNOSIS — I10 Essential (primary) hypertension: Secondary | ICD-10-CM

## 2022-10-05 DIAGNOSIS — E66811 Obesity, class 1: Secondary | ICD-10-CM | POA: Diagnosis not present

## 2022-10-05 DIAGNOSIS — E88819 Insulin resistance, unspecified: Secondary | ICD-10-CM

## 2022-10-05 DIAGNOSIS — E6609 Other obesity due to excess calories: Secondary | ICD-10-CM | POA: Diagnosis not present

## 2022-10-05 DIAGNOSIS — Z6830 Body mass index (BMI) 30.0-30.9, adult: Secondary | ICD-10-CM

## 2022-10-05 NOTE — Progress Notes (Deleted)
Jillian Escobar, D.O.  ABFM, ABOM Specializing in Clinical Bariatric Medicine  Office located at: [address] [place], [place]  [address]     Assessment and Plan:   Come fasting for labs next OV  Essential hypertension Assessment & Plan: Last 3 blood pressure readings in our office are as follows: BP Readings from Last 3 Encounters:  10/05/22 123/78  09/13/22 126/70  08/25/22 112/74   HTN treated with Microzide & Cozaar. Blood pressure stable today. No concerns in this regard today. C/w both antihypertensives per PCP/specialists & our low sodium diet, advance exercise as tolerated. We will continue to monitor symptoms as they relate to her weight loss journey.   Insulin resistance Assessment & Plan: Lab Results  Component Value Date   HGBA1C 5.5 03/18/2022   HGBA1C 5.4 07/26/2018   HGBA1C 5.3 02/07/2018   INSULIN 14.5 03/18/2022    No current meds. Diet/exercise approach. Hunger and cravings are controlled when she adheres to her calories and protein goals. Continue to decrease simple carbs/ sugars; increase fiber and proteins -> follow her meal plan. Will recheck labs as deemed appropriate.    Class 1 obesity due to excess calories with serious comorbidity and body mass index (BMI) of 30.0 to 30.9 in adult Assessment & Plan: Since last office visit on 09/13/22 patient's muscle mass has increased by 3 lb. Fat mass has decreased by 4.8 lb. Total body water has increased by 1.2 lb.  Counseling done on how various foods will affect these numbers and how to maximize success  Total lbs lost to date: 12 lbs  Total weight loss percentage to date: 7.06%  No change to meal plan - see Subjective    Behavioral Intervention Additional resources provided today: Food journaling log handout Evidence-based interventions for health behavior change were utilized today including the discussion of self monitoring techniques, problem-solving barriers and SMART goal setting  techniques.   Regarding patient's less desirable eating habits and patterns, we employed the technique of small changes.  Pt will specifically work on: continue journaling her intake/bringing in food log for next visit.    FOLLOW UP: No follow-ups on file. She was informed of the importance of frequent follow up visits to maximize her success with intensive lifestyle modifications for her multiple health conditions.  Subjective:   Chief complaint: Obesity Jillian Escobar is here to discuss her progress with her obesity treatment plan. She is on the Category 1 Plan and keeping a food journal and adhering to recommended goals of [phone] calories and 80+ protein and states she is following her eating plan approximately 80% of the time. She states she is doing aerobics classes 45 minutes, 3 days a week & walking 20 minutes, 7 days a week.   Interval History:  Jillian Escobar is here for a follow up office visit. Since last OV, Jillian Escobar has been doing well. Reports returning from a pleasant beach vacation. Endorse drinking alcoholic beverages and eating some off plan foods on vacation, but walked a lot. She journaled her intake & stayed within her calorie and protein goals roughly 80% of the time. Reports having upcoming trips to [place] & [place].   Review of Systems:  Pertinent positives were addressed with patient today.  Reviewed by clinician on day of visit: allergies, medications, problem list, medical history, surgical history, family history, social history, and previous encounter notes.  Weight Summary and Biometrics   Weight Lost Since Last Visit: 2lb  Weight Gained Since Last Visit:  0lb    Vitals Temp: 98.2 F (36.8 C) BP: 123/78 Pulse Rate: 66 SpO2: 99 %   Anthropometric Measurements Height: 5\' 3"  (1.6 m) Weight: 158 lb (71.7 kg) BMI (Calculated): 28 Weight at Last Visit: 160lb Weight Lost Since Last Visit: 2lb Weight Gained Since Last Visit: 0lb Starting Weight:  170lb Total Weight Loss (lbs): 12 lb (5.443 kg) Peak Weight: 197lb   Body Composition  Body Fat %: 36.2 % Fat Mass (lbs): 57.4 lbs Muscle Mass (lbs): 96.2 lbs Total Body Water (lbs): 70.2 lbs Visceral Fat Rating : 10   Other Clinical Data Fasting: no Labs: no Today's Visit #: 7 Starting Date: 03/18/22   Objective:   PHYSICAL EXAM: Blood pressure 123/78, pulse 66, temperature 98.2 F (36.8 C), height 5\' 3"  (1.6 m), weight 158 lb (71.7 kg), SpO2 99%. Body mass index is 27.99 kg/m.  General: Well Developed, well nourished, and in no acute distress.  HEENT: Normocephalic, atraumatic Skin: Warm and dry, cap RF less 2 sec, good turgor Chest:  Normal excursion, shape, no gross abn Respiratory: speaking in full sentences, no conversational dyspnea NeuroM-Sk: Ambulates w/o assistance, moves * 4 Psych: A and O *3, insight good, mood-full  DIAGNOSTIC DATA REVIEWED:  BMET    Component Value Date/Time   NA 139 04/13/2022 0543   NA 140 03/18/2022 0847   K 3.6 04/13/2022 0543   CL 105 04/13/2022 0543   CO2 25 04/13/2022 0543   GLUCOSE 114 (H) 04/13/2022 0543   BUN 20 04/13/2022 0543   BUN 14 03/18/2022 0847   CREATININE 0.84 04/13/2022 0543   CALCIUM 9.9 04/13/2022 0543   GFRNONAA >60 04/13/2022 0543   GFRAA 73 04/04/2019 1234   Lab Results  Component Value Date   HGBA1C 5.5 03/18/2022   HGBA1C 5.3 02/07/2018   Lab Results  Component Value Date   INSULIN 14.5 03/18/2022   Lab Results  Component Value Date   TSH 3.780 03/18/2022   CBC    Component Value Date/Time   WBC 5.3 04/13/2022 0543   RBC 4.92 04/13/2022 0543   HGB 15.0 04/13/2022 0543   HGB 14.8 03/18/2022 0847   HCT 43.3 04/13/2022 0543   HCT 43.3 03/18/2022 0847   PLT 333 04/13/2022 0543   PLT 300 03/18/2022 0847   MCV 88.0 04/13/2022 0543   MCV 91 03/18/2022 0847   MCH 30.5 04/13/2022 0543   MCHC 34.6 04/13/2022 0543   RDW 12.9 04/13/2022 0543   RDW 12.6 03/18/2022 0847   Iron Studies No  results found for: "IRON", "TIBC", "FERRITIN", "IRONPCTSAT" Lipid Panel     Component Value Date/Time   CHOL 152 03/18/2022 0847   TRIG 77 03/18/2022 0847   HDL 60 03/18/2022 0847   CHOLHDL 2.8 07/14/2021 0837   CHOLHDL 4.1 08/21/2019 0915   VLDL 18.4 07/26/2018 0803   LDLCALC 77 03/18/2022 0847   LDLCALC 133 (H) 08/21/2019 0915   LDLDIRECT 158.4 08/14/2012 0828   Hepatic Function Panel     Component Value Date/Time   PROT 6.9 03/18/2022 0847   ALBUMIN 4.5 03/18/2022 0847   AST 21 03/18/2022 0847   ALT 16 03/18/2022 0847   ALKPHOS 84 03/18/2022 0847   BILITOT 1.0 03/18/2022 0847   BILIDIR 0.25 03/10/2020 0935   IBILI 0.8 08/21/2019 0915      Component Value Date/Time   TSH 3.780 03/18/2022 0847   Nutritional Lab Results  Component Value Date   VD25OH 58.7 03/18/2022    Attestations:   I,  Special Randolm Idol, acting as a Stage manager for Thomasene Lot, DO., have compiled all relevant documentation for today's office visit on behalf of Thomasene Lot, DO, while in the presence of [employer], DO.  I have reviewed the above documentation for accuracy and completeness, and I agree with the above. Jillian Escobar, D.O.  The 21st Century Cures Act was signed into law in 2016 which includes the topic of electronic health records.  This provides immediate access to information in MyChart.  This includes consultation notes, operative notes, office notes, lab results and pathology reports.  If you have any questions about what you read please let us know at your next visit so we can discuss your concerns and take corrective action if need be.  We are right here with you.

## 2022-10-06 NOTE — Progress Notes (Signed)
Jillian Escobar, D.O.  ABFM, ABOM Specializing in Clinical Bariatric Medicine  Office located at: [address] [place], [place]  [address]     Assessment and Plan:   Come fasting for labs next OV  Essential hypertension Assessment & Plan: Last 3 blood pressure readings in our office are as follows: BP Readings from Last 3 Encounters:  10/05/22 123/78  09/13/22 126/70  08/25/22 112/74   HTN treated with Microzide & Cozaar. Blood pressure stable today. No concerns in this regard today. C/w both antihypertensives per PCP/specialists & our low sodium diet, advance exercise as tolerated. We will continue to monitor symptoms as they relate to her weight loss journey.   Insulin resistance Assessment & Plan: Lab Results  Component Value Date   HGBA1C 5.5 03/18/2022   HGBA1C 5.4 07/26/2018   HGBA1C 5.3 02/07/2018   INSULIN 14.5 03/18/2022    No current meds. Diet/exercise approach. Hunger and cravings are controlled when she adheres to her calories and protein goals. Continue with weight loss therapy.    Class 1 obesity due to excess calories with serious comorbidity and body mass index (BMI) of 30.0 to 30.9 in adult Assessment & Plan: Since last office visit on 09/13/22 patient's muscle mass has increased by 3 lb. Fat mass has decreased by 4.8 lb. Total body water has increased by 1.2 lb.  Counseling done on how various foods will affect these numbers and how to maximize success  Total lbs lost to date: 12 lbs  Total weight loss percentage to date: 7.06%  No change to meal plan - see Subjective    Behavioral Intervention Additional resources provided today: Food journaling log handout Evidence-based interventions for health behavior change were utilized today including the discussion of self monitoring techniques, problem-solving barriers and SMART goal setting techniques.   Regarding patient's less desirable eating habits and patterns, we employed the technique of  small changes.  Pt will specifically work on: continue journaling her intake/bringing in food log for next visit.    FOLLOW UP: Return 11/03/22. She was informed of the importance of frequent follow up visits to maximize her success with intensive lifestyle modifications for her multiple health conditions.  Subjective:   Chief complaint: Obesity [name] is here to discuss her progress with her obesity treatment plan. She is on the Category 1 Plan and keeping a food journal and adhering to recommended goals of [phone] calories and 80+ protein and states she is following her eating plan approximately 80% of the time. She states she is doing aerobics classes 45 minutes, 3 days a week & walking 20 minutes, 7 days a week.   Interval History:  Jillian Escobar is here for a follow up office visit. Since last OV, Jillian Escobar has been doing well. Reports returning from a pleasant beach vacation. Endorses drinking alcoholic beverages and eating some off plan foods on vacation, but walked a lot. She journaled her intake & stayed within her calorie and protein goals roughly 80% of the time. Reports having upcoming trips to [place] & [place].   Review of Systems:  Pertinent positives were addressed with patient today.  Reviewed by clinician on day of visit: allergies, medications, problem list, medical history, surgical history, family history, social history, and previous encounter notes.  Weight Summary and Biometrics   Weight Lost Since Last Visit: 2lb  Weight Gained Since Last Visit: 0lb    Vitals Temp: 98.2 F (36.8 C) BP: 123/78 Pulse Rate: 66 SpO2: 99 %  Anthropometric Measurements Height: 5\' 3"  (1.6 m) Weight: 158 lb (71.7 kg) BMI (Calculated): 28 Weight at Last Visit: 160lb Weight Lost Since Last Visit: 2lb Weight Gained Since Last Visit: 0lb Starting Weight: 170lb Total Weight Loss (lbs): 12 lb (5.443 kg) Peak Weight: 197lb   Body Composition  Body Fat %: 36.2 % Fat Mass  (lbs): 57.4 lbs Muscle Mass (lbs): 96.2 lbs Total Body Water (lbs): 70.2 lbs Visceral Fat Rating : 10   Other Clinical Data Fasting: no Labs: no Today's Visit #: 7 Starting Date: 03/18/22   Objective:   PHYSICAL EXAM: Blood pressure 123/78, pulse 66, temperature 98.2 F (36.8 C), height 5\' 3"  (1.6 m), weight 158 lb (71.7 kg), SpO2 99%. Body mass index is 27.99 kg/m.  General: Well Developed, well nourished, and in no acute distress.  HEENT: Normocephalic, atraumatic Skin: Warm and dry, cap RF less 2 sec, good turgor Chest:  Normal excursion, shape, no gross abn Respiratory: speaking in full sentences, no conversational dyspnea NeuroM-Sk: Ambulates w/o assistance, moves * 4 Psych: A and O *3, insight good, mood-full  DIAGNOSTIC DATA REVIEWED:  BMET    Component Value Date/Time   NA 139 04/13/2022 0543   NA 140 03/18/2022 0847   K 3.6 04/13/2022 0543   CL 105 04/13/2022 0543   CO2 25 04/13/2022 0543   GLUCOSE 114 (H) 04/13/2022 0543   BUN 20 04/13/2022 0543   BUN 14 03/18/2022 0847   CREATININE 0.84 04/13/2022 0543   CALCIUM 9.9 04/13/2022 0543   GFRNONAA >60 04/13/2022 0543   GFRAA 73 04/04/2019 1234   Lab Results  Component Value Date   HGBA1C 5.5 03/18/2022   HGBA1C 5.3 02/07/2018   Lab Results  Component Value Date   INSULIN 14.5 03/18/2022   Lab Results  Component Value Date   TSH 3.780 03/18/2022   CBC    Component Value Date/Time   WBC 5.3 04/13/2022 0543   RBC 4.92 04/13/2022 0543   HGB 15.0 04/13/2022 0543   HGB 14.8 03/18/2022 0847   HCT 43.3 04/13/2022 0543   HCT 43.3 03/18/2022 0847   PLT 333 04/13/2022 0543   PLT 300 03/18/2022 0847   MCV 88.0 04/13/2022 0543   MCV 91 03/18/2022 0847   MCH 30.5 04/13/2022 0543   MCHC 34.6 04/13/2022 0543   RDW 12.9 04/13/2022 0543   RDW 12.6 03/18/2022 0847   Iron Studies No results found for: "IRON", "TIBC", "FERRITIN", "IRONPCTSAT" Lipid Panel     Component Value Date/Time   CHOL 152  03/18/2022 0847   TRIG 77 03/18/2022 0847   HDL 60 03/18/2022 0847   CHOLHDL 2.8 07/14/2021 0837   CHOLHDL 4.1 08/21/2019 0915   VLDL 18.4 07/26/2018 0803   LDLCALC 77 03/18/2022 0847   LDLCALC 133 (H) 08/21/2019 0915   LDLDIRECT 158.4 08/14/2012 0828   Hepatic Function Panel     Component Value Date/Time   PROT 6.9 03/18/2022 0847   ALBUMIN 4.5 03/18/2022 0847   AST 21 03/18/2022 0847   ALT 16 03/18/2022 0847   ALKPHOS 84 03/18/2022 0847   BILITOT 1.0 03/18/2022 0847   BILIDIR 0.25 03/10/2020 0935   IBILI 0.8 08/21/2019 0915      Component Value Date/Time   TSH 3.780 03/18/2022 0847   Nutritional Lab Results  Component Value Date   VD25OH 58.7 03/18/2022    Attestations:   I, Special Puri, acting as a Stage manager for Thomasene Lot, DO., have compiled all relevant documentation for today's office visit  on behalf of Thomasene Lot, DO, while in the presence of Thomasene Lot, DO.  I have reviewed the above documentation for accuracy and completeness, and I agree with the above. Jillian Escobar, D.O.  The 21st Century Cures Act was signed into law in 2016 which includes the topic of electronic health records.  This provides immediate access to information in MyChart.  This includes consultation notes, operative notes, office notes, lab results and pathology reports.  If you have any questions about what you read please let us know at your next visit so we can discuss your concerns and take corrective action if need be.  We are right here with you.

## 2022-10-25 ENCOUNTER — Encounter (INDEPENDENT_AMBULATORY_CARE_PROVIDER_SITE_OTHER): Payer: Self-pay | Admitting: Family Medicine

## 2022-10-25 ENCOUNTER — Other Ambulatory Visit: Payer: Self-pay | Admitting: Family Medicine

## 2022-10-25 ENCOUNTER — Telehealth (INDEPENDENT_AMBULATORY_CARE_PROVIDER_SITE_OTHER): Payer: Self-pay

## 2022-10-25 DIAGNOSIS — Z1231 Encounter for screening mammogram for malignant neoplasm of breast: Secondary | ICD-10-CM

## 2022-10-25 NOTE — Telephone Encounter (Signed)
Called about Mychart message.  No answer [id number]

## 2022-10-26 NOTE — Progress Notes (Unsigned)
[name] [address] [place] [address] Phone: [phone] Subjective:   [name], am serving as a scribe for Dr. Antoine Primas.  I'm seeing this patient by the request  of:  Shelva Majestic, MD  CC: back and neck pain   [id number]  Jillian Escobar is a 72 y.o. female coming in with complaint of back and neck pain. OMT on 08/25/2022. Patient states that her entire body has been sore. Has been doing a lot of traveling and sleeping in different beds.   C/o pain in R shoulder in lateral aspect that can radiate down the arm. Has tingling at night when lying on L side. Pain started months ago but seems to be worsening.       Reviewed prior external information including notes and imaging from previsou exam, outside providers and external EMR if available.   As well as notes that were available from care everywhere and other healthcare systems.  Past medical history, social, surgical and family history all reviewed in electronic medical record.  No pertanent information unless stated regarding to the chief complaint.   Past Medical History:  Diagnosis Date   Allergy    Aortic valve insufficiency    Arthritis    GERD (gastroesophageal reflux disease)    Hx of varicella    Hyperlipidemia    borderline   Hypertension    Hypothyroidism    Pneumonia 11/2014   Only time took inhalers to treat   RBBB 10/2019   recently seen at [facility] - f/u 6 mons, no treatment   Sacral back pain    Shingles 10/26/2010   Syncope and collapse 10/2019   Unclear etiology - just saw [facility] - f/u 6 months - no treatment   Thyroid disease     Allergies  Allergen Reactions   Levaquin [Levofloxacin]     See notes  September 2019  Palpitations irritability sleep issues       Review of Systems:  No headache, visual changes, nausea, vomiting, diarrhea, constipation, dizziness, abdominal pain, skin rash, fevers, chills, night sweats, weight loss, swollen  lymph nodes, body aches, joint swelling, chest pain, shortness of breath, mood changes. POSITIVE muscle aches  Objective  Blood pressure 118/84, pulse 66, height 5\' 3"  (1.6 m), weight 161 lb (73 kg), SpO2 96%.   General: No apparent distress alert and oriented x3 mood and affect normal, dressed appropriately.  HEENT: Pupils equal, extraocular movements intact  Respiratory: Patient's speak in full sentences and does not appear short of breath  Cardiovascular: No lower extremity edema, non tender, no erythema  Gait MSK:  Back has some loss lordosis with some increasing kyphosis of the thoracic spine.  Tightness noted more in the right shoulder blade.  Positive impingement noted with the shoulder, positive crossover noted.  Rotator cuff strength does appear to be intact  Limited muscular skeletal ultrasound was performed and interpreted by Antoine Primas, M  Limited ultrasound shows patient does have significant hypoechoic changes of the acromioclavicular joint with some moderate narrowing.  Patient also has some mild hypoechoic changes in the subacromial space that is consistent with a bursitis.  Rotator cuff knee on the right side does have some degenerative changes noted but seems to be chronic with no significant acute changes noted. Impression: AC arthritis with effusion as well as some subacromial bursitis  Procedure: Real-time Ultrasound Guided Injection of right acromioclavicular joint Device: GE Logiq Q7 Ultrasound guided injection is preferred based studies  that show increased duration, increased effect, greater accuracy, decreased procedural pain, increased response rate, and decreased cost with ultrasound guided versus blind injection.  Verbal informed consent obtained.  Time-out conducted.  Noted no overlying erythema, induration, or other signs of local infection.  Skin prepped in a sterile fashion.  Local anesthesia: Topical Ethyl chloride.  With sterile technique and under real  time ultrasound guidance: With a 25-gauge half inch needle injected with 0.5 cc of 0.5% Marcaine and 0.5 cc of Kenalog 40 mg/mL Completed without difficulty  Pain immediately resolved suggesting accurate placement of the medication.  Advised to call if fevers/chills, erythema, induration, drainage, or persistent bleeding.  Impression: Technically successful ultrasound guided injection.   Osteopathic findings  C2 flexed rotated and side bent right C6 flexed rotated and side bent right T5 extended rotated and side bent right inhaled rib L2 flexed rotated and side bent right Sacrum right on right     Assessment and Plan:  AC (acromioclavicular) arthritis Increase discomfort noted in the right shoulder.  Given injection with some moderate improvement in range of motion.  Does have a subacromial bursitis as well we need to monitor.  Home exercises given, discussed icing regimen, which activities to do and which ones to avoid.  Follow-up again in 6 to 8 weeks otherwise.  Degenerative disc disease, lumbar Known degenerative disc disease with likely has responded relatively well though to osteopathic manipulation as well as modality.  Has any different medications for breakthrough.  We will continue to monitor.  The patient is going to be traveling again and will continue to do the home exercises.  Follow-up again in 6 to 8 weeks    Nonallopathic problems  Decision today to treat with OMT was based on Physical Exam  After verbal consent patient was treated with HVLA, ME, FPR techniques in cervical, rib, thoracic, lumbar, and sacral  areas  Patient tolerated the procedure well with improvement in symptoms  Patient given exercises, stretches and lifestyle modifications  See medications in patient instructions if given  Patient will follow up in 4-8 weeks     The above documentation has been reviewed and is accurate and complete Judi Saa, DO         Note: This dictation  was prepared with Dragon dictation along with smaller phrase technology. Any transcriptional errors that result from this process are unintentional.

## 2022-10-27 ENCOUNTER — Encounter: Payer: Self-pay | Admitting: Family Medicine

## 2022-10-27 ENCOUNTER — Ambulatory Visit (INDEPENDENT_AMBULATORY_CARE_PROVIDER_SITE_OTHER): Payer: Medicare Other

## 2022-10-27 ENCOUNTER — Ambulatory Visit: Payer: Medicare Other | Admitting: Family Medicine

## 2022-10-27 ENCOUNTER — Other Ambulatory Visit: Payer: Self-pay

## 2022-10-27 ENCOUNTER — Telehealth (INDEPENDENT_AMBULATORY_CARE_PROVIDER_SITE_OTHER): Payer: Self-pay | Admitting: Family Medicine

## 2022-10-27 VITALS — BP 118/84 | HR 66 | Ht 63.0 in | Wt 161.0 lb

## 2022-10-27 DIAGNOSIS — M19011 Primary osteoarthritis, right shoulder: Secondary | ICD-10-CM | POA: Diagnosis not present

## 2022-10-27 DIAGNOSIS — M9901 Segmental and somatic dysfunction of cervical region: Secondary | ICD-10-CM

## 2022-10-27 DIAGNOSIS — M25511 Pain in right shoulder: Secondary | ICD-10-CM

## 2022-10-27 DIAGNOSIS — M9908 Segmental and somatic dysfunction of rib cage: Secondary | ICD-10-CM

## 2022-10-27 DIAGNOSIS — M9904 Segmental and somatic dysfunction of sacral region: Secondary | ICD-10-CM

## 2022-10-27 DIAGNOSIS — M9902 Segmental and somatic dysfunction of thoracic region: Secondary | ICD-10-CM | POA: Diagnosis not present

## 2022-10-27 DIAGNOSIS — M51362 Other intervertebral disc degeneration, lumbar region with discogenic back pain and lower extremity pain: Secondary | ICD-10-CM

## 2022-10-27 DIAGNOSIS — M9903 Segmental and somatic dysfunction of lumbar region: Secondary | ICD-10-CM

## 2022-10-27 DIAGNOSIS — M19019 Primary osteoarthritis, unspecified shoulder: Secondary | ICD-10-CM | POA: Insufficient documentation

## 2022-10-27 NOTE — Assessment & Plan Note (Signed)
Increase discomfort noted in the right shoulder.  Given injection with some moderate improvement in range of motion.  Does have a subacromial bursitis as well we need to monitor.  Home exercises given, discussed icing regimen, which activities to do and which ones to avoid.  Follow-up again in 6 to 8 weeks otherwise.

## 2022-10-27 NOTE — Patient Instructions (Addendum)
Xray on your way out Injection in shoulder Do prescribed exercises at least 3x a week See you for December trip

## 2022-10-27 NOTE — Assessment & Plan Note (Signed)
Known degenerative disc disease with likely has responded relatively well though to osteopathic manipulation as well as modality.  Has any different medications for breakthrough.  We will continue to monitor.  The patient is going to be traveling again and will continue to do the home exercises.  Follow-up again in 6 to 8 weeks

## 2022-10-27 NOTE — Telephone Encounter (Signed)
Pt has called requesting to speak with Dr. Val Eagle directly. She only wants to speak with Dr. Val Eagle. Pt has requested a call back today after 3pm, as she starts working the Chief Strategy Officer.

## 2022-11-03 ENCOUNTER — Ambulatory Visit (INDEPENDENT_AMBULATORY_CARE_PROVIDER_SITE_OTHER): Payer: Medicare Other | Admitting: Family Medicine

## 2022-11-03 ENCOUNTER — Encounter (INDEPENDENT_AMBULATORY_CARE_PROVIDER_SITE_OTHER): Payer: Self-pay | Admitting: Family Medicine

## 2022-11-03 VITALS — BP 108/71 | HR 57 | Temp 98.1°F | Ht 63.0 in | Wt 155.0 lb

## 2022-11-03 DIAGNOSIS — Z6830 Body mass index (BMI) 30.0-30.9, adult: Secondary | ICD-10-CM

## 2022-11-03 DIAGNOSIS — E039 Hypothyroidism, unspecified: Secondary | ICD-10-CM | POA: Diagnosis not present

## 2022-11-03 DIAGNOSIS — E88819 Insulin resistance, unspecified: Secondary | ICD-10-CM

## 2022-11-03 DIAGNOSIS — E669 Obesity, unspecified: Secondary | ICD-10-CM

## 2022-11-03 DIAGNOSIS — E785 Hyperlipidemia, unspecified: Secondary | ICD-10-CM | POA: Diagnosis not present

## 2022-11-03 DIAGNOSIS — I10 Essential (primary) hypertension: Secondary | ICD-10-CM

## 2022-11-03 DIAGNOSIS — R739 Hyperglycemia, unspecified: Secondary | ICD-10-CM

## 2022-11-03 DIAGNOSIS — Z6827 Body mass index (BMI) 27.0-27.9, adult: Secondary | ICD-10-CM

## 2022-11-03 DIAGNOSIS — E559 Vitamin D deficiency, unspecified: Secondary | ICD-10-CM

## 2022-11-03 NOTE — Progress Notes (Signed)
Jillian Escobar, D.O.  AB[facility], AB[facility] Specializing in Clinical Bariatric Medicine  Office located at: [address] [place], [place]  [address]     Assessment and Plan:   Orders Placed This Encounter  Procedures   VITAMIN D 25 Hydroxy (Vit-D Deficiency, Fractures)   Comprehensive metabolic panel   Hemoglobin A1c   Lipid Panel With LDL/HDL Ratio   Vitamin B12   TSH   T4, free   T3   Insulin, random   Labs obtained today will review at next OV.    Essential hypertension Assessment & Plan: BP Readings from Last 3 Encounters:  11/03/22 108/71  10/27/22 118/84  10/05/22 123/78   BP is at goal today at 108/71. Of note, pt has had low BP readings in the past. Pt is taking hydrochlorothiazide and losartan. Tolerating well with no reported side effects. She previously used to monitor her BP every morning, but has not been doing so recently. Pt endorses occasional lightheadedness when walking.   Ambulatory blood pressure monitoring encouraged if BP is less than 100/60 on a regular basis. Reminded patient that if they ever feel poorly in any way, to check their blood pressure and pulse as well. Continue with current medication regimen per PCP/specialists. Follow our low sodium diet and exercise as tolerated. CMP and vitamin B12 checked today and will be reviewed at next visit.    Insulin resistance / Hyperglycemia, unspecified Assessment & Plan: Lab Results  Component Value Date   HGBA1C 5.3 11/03/2022   HGBA1C 5.5 03/18/2022   HGBA1C 5.4 07/26/2018   INSULIN 9.9 11/03/2022   INSULIN 14.5 03/18/2022    Not taking any med currently. Managing with healthy diet and exercise. Hunger and cravings are overall well controlled. Pt will consider if meds will be helpful in the future. Continue with current weight loss plan. A1C and insulin checked today, will be reviewed at next visit.    Hyperlipidemia, unspecified hyperlipidemia type Assessment & Plan: Lab Results  Component  Value Date   CHOL 126 11/03/2022   HDL 54 11/03/2022   LDLCALC 61 11/03/2022   LDLDIRECT 158.4 08/14/2012   TRIG 45 11/03/2022   CHOLHDL 2.8 07/14/2021   Pt taking Lipitor 20 mg daily. Tolerating well with no side effects. No concerns today. Continue with her statin therapy as  directed by PCP/specialists. Will check fasting lipid panel and CMP today and will review at her next visit.    Hypothyroidism, unspecified type Assessment & Plan: Pt is taking Synthroid 50 mg once daily to manage her hypothyroidism. Tolerating well with no adverse side effects reports. Continue with current regimen per PCP. Will check TSH, T4 (free), and T3 ordered today.    Vitamin D deficiency Assessment & Plan: Lab Results  Component Value Date   VD25OH 75.4 11/03/2022   VD25OH 58.7 03/18/2022   Pt taking 1,000 IU of vitamin D OTC daily. No problems with energy. No concerns today. Continue taking vitamin D supplements. We will check vitamin D levels today and review at her next visit.    Obesity, with starting BMI of 30.11 BMI 27.0-27.9,adult-current BMi  27.46 Assessment & Plan: Jillian Escobar is here to discuss her progress with her obesity treatment plan along with follow-up of her obesity related diagnoses. See Medical Weight Management Flowsheet for complete bioelectrical impedance results.  Reviewed healthy options of protein including lean meats (chicken), tofu, and edamame. Pt was also given a healthy sources of protein and complex with extensive counseling given.  We discussed food portion sizes of 1/2 plate of protein and the rest made of complex carbs and vegetables. I recommend she increase her protein intake and follow her prudent nutritional meal plan to meet intake goal of 80+ grams of protein daily. Continue to drink plenty water throughout the day, half her weight in ounces of water daily.   Since last office visit on 10/05/22 patient's muscle mass has decreased by 4.8 lbs. Fat mass  has increased by 1.8 lbs. Total body water has decreased by 4.6 lbs.  Counseling done on how various foods will affect these numbers and how to maximize success  Total lbs lost to date: 15 lbs Total weight loss percentage to date: -8.82 %  No change to meal plan - see Subjective  Behavioral Intervention Additional resources provided today: Healthy sources of complex carbs and protein handout.  Evidence-based interventions for health behavior change were utilized today including the discussion of self monitoring techniques, problem-solving barriers and SMART goal setting techniques.   Regarding patient's less desirable eating habits and patterns, we employed the technique of small changes.  Pt will specifically work on: following her meal plan by meeting her calorie and protein goals and not skipping meals for next visit.    FOLLOW UP: Return in about 22 days (around 11/25/2022). She was informed of the importance of frequent follow up visits to maximize her success with intensive lifestyle modifications for her multiple health conditions.  Subjective:   Chief complaint: Obesity [name] is here to discuss her progress with her obesity treatment plan. She is on the Category 1 Plan and keeping a food journal and adhering to recommended goals of [phone] calories and 80+ grams of protein and states she is following her eating plan approximately 80% of the time. She states she is exercising at the gym 30 minutes 3 days per week and walking 45 minutes 4 days per week  Interval History:  Jillian Escobar is here for a follow up office visit. Since last OV, she has been following her meal plan well since returning from vacation. She reports not eating on plan while on vacation.   She has been staying within her daily calorie goal and surpasses her protein goal. Usually eats around 100 grams of protein daily. Does not eat protein bars much. Typically her hunger is increased at lunchtime. However, she  reports decreased appetite at dinnertime and has to "force" herself to eat fruit or drink a protein shake. States she has been drinking sufficient amounts of water daily.   Pharmacotherapy for weight loss: She is not currently taking medications  for medical weight loss.  Review of Systems:  Pertinent positives were addressed with patient today.  Reviewed by clinician on day of visit: allergies, medications, problem list, medical history, surgical history, family history, social history, and previous encounter notes.  Weight Summary and Biometrics   Weight Lost Since Last Visit: 3lb  Weight Gained Since Last Visit: 0lb   Vitals Temp: 98.1 F (36.7 C) BP: 108/71 Pulse Rate: (!) 57 SpO2: 99 %   Anthropometric Measurements Height: 5\' 3"  (1.6 m) Weight: 155 lb (70.3 kg) BMI (Calculated): 27.46 Weight at Last Visit: 158lb Weight Lost Since Last Visit: 3lb Weight Gained Since Last Visit: 0lb Starting Weight: 170lb Total Weight Loss (lbs): 15 lb (6.804 kg) Peak Weight: 197lb   Body Composition  Body Fat %: 38 % Fat Mass (lbs): 59.2 lbs Muscle Mass (lbs): 91.4 lbs Total Body Water (lbs): 65.6 lbs Visceral Fat  Rating : 10   Other Clinical Data Fasting: yes Labs: yes Today's Visit #: 8 Starting Date: 03/18/22    Objective:   PHYSICAL EXAM: Blood pressure 108/71, pulse (!) 57, temperature 98.1 F (36.7 C), height 5\' 3"  (1.6 m), weight 155 lb (70.3 kg), SpO2 99%. Body mass index is 27.46 kg/m.  General: Well Developed, well nourished, and in no acute distress.  HEENT: Normocephalic, atraumatic Skin: Warm and dry, cap RF less 2 sec, good turgor Chest:  Normal excursion, shape, no gross abn Respiratory: speaking in full sentences, no conversational dyspnea NeuroM-Sk: Ambulates w/o assistance, moves * 4 Psych: A and O *3, insight good, mood-full  DIAGNOSTIC DATA REVIEWED:  BMET    Component Value Date/Time   NA 139 04/13/2022 0543   NA 140 03/18/2022 0847    K 3.6 04/13/2022 0543   CL 105 04/13/2022 0543   CO2 25 04/13/2022 0543   GLUCOSE 114 (H) 04/13/2022 0543   BUN 20 04/13/2022 0543   BUN 14 03/18/2022 0847   CREATININE 0.84 04/13/2022 0543   CALCIUM 9.9 04/13/2022 0543   GFRNONAA >60 04/13/2022 0543   GFRAA 73 04/04/2019 1234   Lab Results  Component Value Date   HGBA1C 5.5 03/18/2022   HGBA1C 5.3 02/07/2018   Lab Results  Component Value Date   INSULIN 14.5 03/18/2022   Lab Results  Component Value Date   TSH 3.780 03/18/2022   CBC    Component Value Date/Time   WBC 5.3 04/13/2022 0543   RBC 4.92 04/13/2022 0543   HGB 15.0 04/13/2022 0543   HGB 14.8 03/18/2022 0847   HCT 43.3 04/13/2022 0543   HCT 43.3 03/18/2022 0847   PLT 333 04/13/2022 0543   PLT 300 03/18/2022 0847   MCV 88.0 04/13/2022 0543   MCV 91 03/18/2022 0847   MCH 30.5 04/13/2022 0543   MCHC 34.6 04/13/2022 0543   RDW 12.9 04/13/2022 0543   RDW 12.6 03/18/2022 0847   Iron Studies No results found for: "IRON", "TIBC", "FERRITIN", "IRONPCTSAT" Lipid Panel     Component Value Date/Time   CHOL 152 03/18/2022 0847   TRIG 77 03/18/2022 0847   HDL 60 03/18/2022 0847   CHOLHDL 2.8 07/14/2021 0837   CHOLHDL 4.1 08/21/2019 0915   VLDL 18.4 07/26/2018 0803   LDLCALC 77 03/18/2022 0847   LDLCALC 133 (H) 08/21/2019 0915   LDLDIRECT 158.4 08/14/2012 0828   Hepatic Function Panel     Component Value Date/Time   PROT 6.9 03/18/2022 0847   ALBUMIN 4.5 03/18/2022 0847   AST 21 03/18/2022 0847   ALT 16 03/18/2022 0847   ALKPHOS 84 03/18/2022 0847   BILITOT 1.0 03/18/2022 0847   BILIDIR 0.25 03/10/2020 0935   IBILI 0.8 08/21/2019 0915      Component Value Date/Time   TSH 3.780 03/18/2022 0847   Nutritional Lab Results  Component Value Date   VD25OH 58.7 03/18/2022    Attestations:   I, Isabelle Course, acting as a medical scribe for Thomasene Lot, DO., have compiled all relevant documentation for today's office visit on behalf of Thomasene Lot, DO, while in the presence of [employer], DO.  I have reviewed the above documentation for accuracy and completeness, and I agree with the above. Jillian Escobar, D.O.  The 21st Century Cures Act was signed into law in 2016 which includes the topic of electronic health records.  This provides immediate access to information in MyChart.  This includes consultation notes, operative notes, office  notes, lab results and pathology reports.  If you have any questions about what you read please let us know at your next visit so we can discuss your concerns and take corrective action if need be.  We are right here with you.

## 2022-11-04 LAB — HEMOGLOBIN A1C
Est. average glucose Bld gHb Est-mCnc: 105 mg/dL
Hgb A1c MFr Bld: 5.3 % (ref 4.8–5.6)

## 2022-11-04 LAB — COMPREHENSIVE METABOLIC PANEL
ALT: 17 [IU]/L (ref 0–32)
AST: 18 [IU]/L (ref 0–40)
Albumin: 4.4 g/dL (ref 3.9–4.9)
Alkaline Phosphatase: 68 [IU]/L (ref 44–121)
BUN/Creatinine Ratio: 29 — ABNORMAL HIGH (ref 12–28)
BUN: 22 mg/dL (ref 8–27)
Bilirubin Total: 0.8 mg/dL (ref 0.0–1.2)
CO2: 23 mmol/L (ref 20–29)
Calcium: 10.2 mg/dL (ref 8.7–10.3)
Chloride: 103 mmol/L (ref 96–106)
Creatinine, Ser: 0.75 mg/dL (ref 0.57–1.00)
Globulin, Total: 2.4 g/dL (ref 1.5–4.5)
Glucose: 101 mg/dL — ABNORMAL HIGH (ref 70–99)
Potassium: 4 mmol/L (ref 3.5–5.2)
Sodium: 140 mmol/L (ref 134–144)
Total Protein: 6.8 g/dL (ref 6.0–8.5)
eGFR: 86 mL/min/{1.73_m2} (ref >59)

## 2022-11-04 LAB — LIPID PANEL WITH LDL/HDL RATIO
Cholesterol, Total: 126 mg/dL (ref 100–199)
HDL: 54 mg/dL (ref >39)
LDL Chol Calc (NIH): 61 mg/dL (ref 0–99)
LDL/HDL Ratio: 1.1 ratio (ref 0.0–3.2)
Triglycerides: 45 mg/dL (ref 0–149)
VLDL Cholesterol Cal: 11 mg/dL (ref 5–40)

## 2022-11-04 LAB — T4, FREE: Free T4: 1.73 ng/dL (ref 0.82–1.77)

## 2022-11-04 LAB — VITAMIN D 25 HYDROXY (VIT D DEFICIENCY, FRACTURES): Vit D, 25-Hydroxy: 75.4 ng/mL (ref 30.0–100.0)

## 2022-11-04 LAB — INSULIN, RANDOM: INSULIN: 9.9 u[IU]/mL (ref 2.6–24.9)

## 2022-11-04 LAB — TSH: TSH: 2.75 u[IU]/mL (ref 0.450–4.500)

## 2022-11-04 LAB — VITAMIN B12: Vitamin B-12: >2000 pg/mL — ABNORMAL HIGH (ref 232–1245)

## 2022-11-05 ENCOUNTER — Other Ambulatory Visit: Payer: Self-pay | Admitting: Medical Genetics

## 2022-11-05 DIAGNOSIS — Z006 Encounter for examination for normal comparison and control in clinical research program: Secondary | ICD-10-CM

## 2022-11-15 ENCOUNTER — Ambulatory Visit: Payer: Medicare Other | Admitting: Family Medicine

## 2022-11-25 ENCOUNTER — Encounter (INDEPENDENT_AMBULATORY_CARE_PROVIDER_SITE_OTHER): Payer: Self-pay | Admitting: Family Medicine

## 2022-11-25 ENCOUNTER — Ambulatory Visit (INDEPENDENT_AMBULATORY_CARE_PROVIDER_SITE_OTHER): Payer: Medicare Other | Admitting: Family Medicine

## 2022-11-25 VITALS — BP 108/71 | HR 92 | Temp 98.1°F | Ht 63.0 in | Wt 157.0 lb

## 2022-11-25 DIAGNOSIS — E559 Vitamin D deficiency, unspecified: Secondary | ICD-10-CM

## 2022-11-25 DIAGNOSIS — E88819 Insulin resistance, unspecified: Secondary | ICD-10-CM

## 2022-11-25 DIAGNOSIS — E669 Obesity, unspecified: Secondary | ICD-10-CM

## 2022-11-25 DIAGNOSIS — E785 Hyperlipidemia, unspecified: Secondary | ICD-10-CM | POA: Diagnosis not present

## 2022-11-25 DIAGNOSIS — E66811 Obesity, class 1: Secondary | ICD-10-CM

## 2022-11-25 DIAGNOSIS — Z6827 Body mass index (BMI) 27.0-27.9, adult: Secondary | ICD-10-CM

## 2022-11-25 DIAGNOSIS — E039 Hypothyroidism, unspecified: Secondary | ICD-10-CM | POA: Diagnosis not present

## 2022-11-25 MED ORDER — VITAMIN D 25 MCG (1000 UNIT) PO TABS
ORAL_TABLET | ORAL | Status: DC
Start: 2022-11-25 — End: 2023-07-21

## 2022-11-25 MED ORDER — B-COMPLEX/B-12 PO TABS: ORAL_TABLET | ORAL | Status: DC

## 2022-11-25 NOTE — Progress Notes (Signed)
Jillian Escobar, D.O.  ABFM, ABOM Specializing in Clinical Bariatric Medicine  Office located at: [address] [place], [place]  [address]   Assessment and Plan:   FOR THE DISEASE OF OBESITY: BMI 27.0-27.9,adult-current BMi  27.82 Obesity, with starting BMI of 30.11 Assessment & Plan: Since last office visit on 11/03/22 patient's  Muscle mass has increased by 0.6 lb. Fat mass has increased by 1.4 lb. Total body water has increased by 0.2 lb.  Counseling done on how various foods will affect these numbers and how to maximize success  Total lbs lost to date: 13 lbs  Total weight loss percentage to date: 7.65%     Recommended Dietary Goals Jillian Escobar is currently in the action stage of change. As such, her goal is to continue weight management plan.  She has agreed to: continue current plan   Behavioral Intervention We discussed the following today: staying on track while traveling and vacationing, celebration eating strategies, and different protein powder options.   Additional resources provided today: Handout on holiday eating strategies.   Evidence-based interventions for health behavior change were utilized today including the discussion of self monitoring techniques, problem-solving barriers and SMART goal setting techniques.   Regarding patient's less desirable eating habits and patterns, we employed the technique of small changes.   Pt will specifically work on: n/a   Recommended Physical Activity Goals Jillian Escobar has been advised to work up to 150 minutes of moderate intensity aerobic activity a week and strengthening exercises 2-3 times per week for cardiovascular health, weight loss maintenance and preservation of muscle mass.   She has agreed to : Continue current level of physical activity    Pharmacotherapy We both agreed to : continue with nutritional and behavioral strategies   FOR ASSOCIATED CONDITIONS ADDRESSED TODAY:  Hyperlipidemia, unspecified  hyperlipidemia type Assessment & Plan: Lab Results  Component Value Date   CHOL 126 11/03/2022   HDL 54 11/03/2022   LDLCALC 61 11/03/2022   LDLDIRECT 158.4 08/14/2012   TRIG 45 11/03/2022   CHOLHDL 2.8 07/14/2021   Hyperlipidemia treated with Lipitor 20 mg daily and krill oil twice daily. Triglycerides have improved from 77 to 45. LDL has improved from 77 to 61.   Continue with current treatment regiment. Continue with our heart-healthy, low cholesterol meal plan. We recommend: aerobic activity with eventual goal of a minimum of 150+ min wk plus 2 days/ week of resistance or strength training.     Hypothyroidism, unspecified type Assessment & Plan: Lab Results  Component Value Date   TSH 2.750 11/03/2022   FREET4 1.73 11/03/2022    Condition treated with Levothyroxine 50 mcg daily. No concerns with TSH and  Free T4 levels. She will continue with thyroid medication at current dose. Will continue to monitor condition.    Insulin resistance Assessment & Plan: Lab Results  Component Value Date   HGBA1C 5.3 11/03/2022   HGBA1C 5.5 03/18/2022   HGBA1C 5.4 07/26/2018   INSULIN 9.9 11/03/2022   INSULIN 14.5 03/18/2022   Lab Results  Component Value Date   CREATININE 0.75 11/03/2022   BUN 22 11/03/2022   NA 140 11/03/2022   K 4.0 11/03/2022   CL 103 11/03/2022   CO2 23 11/03/2022      Component Value Date/Time   PROT 6.8 11/03/2022 0906   ALBUMIN 4.4 11/03/2022 0906   AST 18 11/03/2022 0906   ALT 17 11/03/2022 0906   ALKPHOS 68 11/03/2022 0906   BILITOT 0.8 11/03/2022 0906  BILIDIR 0.25 03/10/2020 0935   IBILI 0.8 08/21/2019 0915   Lab Results  Component Value Date   VITAMINB12 >2000 (H) 11/03/2022   No current meds. Diet/exercise approach. Her Hemoglobin A1c has improved from 5.5 to 5.3. Her fasting insulin has improved from 14.5 to 9.9. Kidney function, electrolytes, and liver enzymes are within recommended limits. B12 levels are elevated- pt currently on a  daily B complex.   I recommended the patient to drink approximately half of their body weight in ounces of water daily. Additionally, have an extra bottle of water for every 30 minutes of exercise. Pt instructed to take her B-complex every other day. Continue with weight loss therapy via reduced calorie nutritional plan.  Orders: -     B-Complex/B-12; Change to 1 every other day   Vitamin D deficiency Assessment & Plan: Lab Results  Component Value Date   VD25OH 75.4 11/03/2022   VD25OH 58.7 03/18/2022   She is currently on OTC vit D 1,000 international units daily. Her vitamin D levels are high.   Pt instructed to decrease OTC vit D to every other day. Will continue to monitor condition.   Orders: -     Vitamin D; 1,000 IU every other day   FOLLOW UP: Return 01/11/23. She was informed of the importance of frequent follow up visits to maximize her success with intensive lifestyle modifications for her multiple health conditions.  Subjective:   Chief complaint: Obesity Jillian Escobar is here to discuss her progress with her obesity treatment plan. She is on the Category 1 Plan and keeping a food journal and adhering to recommended goals of [phone] calories and 80+ grams of protein  and states she is following her eating plan approximately 80% of the time. She states she is walking 30 minutes, 7 days a week and doing aerobic training 45 minutes, 3 days a week.   Interval History:  Jillian Escobar is here for a follow up office visit. Since last OV, Jillian Escobar is up 2 lbs. Pt went to [place] for 4 days and endorses "drinking and eating". Journaling wise, on average she is over in calories. Reports craving bread and butter. She is worried about getting off track over the holidays.   Pharmacotherapy for weight loss: She is currently taking no anti-obesity medication.   Review of Systems:  Pertinent positives were addressed with patient today.  Reviewed by clinician on day of visit:  allergies, medications, problem list, medical history, surgical history, family history, social history, and previous encounter notes.  Weight Summary and Biometrics   Weight Lost Since Last Visit: 0lb  Weight Gained Since Last Visit: 2lb   Vitals Temp: 98.1 F (36.7 C) BP: 108/71 Pulse Rate: 92 SpO2: 100 %   Anthropometric Measurements Height: 5\' 3"  (1.6 m) Weight: 157 lb (71.2 kg) BMI (Calculated): 27.82 Weight at Last Visit: 155lb Weight Lost Since Last Visit: 0lb Weight Gained Since Last Visit: 2lb Starting Weight: 170lb Total Weight Loss (lbs): 13 lb (5.897 kg) Peak Weight: 197lb   Body Composition  Body Fat %: 38.5 % Fat Mass (lbs): 60.6 lbs Muscle Mass (lbs): 92 lbs Total Body Water (lbs): 65.8 lbs Visceral Fat Rating : 10   Other Clinical Data Fasting: no Labs: no Today's Visit #: 9 Starting Date: 03/18/22   Objective:   PHYSICAL EXAM: Blood pressure 108/71, pulse 92, temperature 98.1 F (36.7 C), height 5\' 3"  (1.6 m), weight 157 lb (71.2 kg), SpO2 100%. Body mass index is 27.81 kg/m.  General:  she is overweight, cooperative and in no acute distress. PSYCH: Has normal mood, affect and thought process.   HEENT: EOMI, sclerae are anicteric. Lungs: Normal breathing effort, no conversational dyspnea. Extremities: Moves * 4 Neurologic: A and O * 3, good insight  DIAGNOSTIC DATA REVIEWED: BMET    Component Value Date/Time   NA 140 11/03/2022 0906   K 4.0 11/03/2022 0906   CL 103 11/03/2022 0906   CO2 23 11/03/2022 0906   GLUCOSE 101 (H) 11/03/2022 0906   GLUCOSE 114 (H) 04/13/2022 0543   BUN 22 11/03/2022 0906   CREATININE 0.75 11/03/2022 0906   CALCIUM 10.2 11/03/2022 0906   GFRNONAA >60 04/13/2022 0543   GFRAA 73 04/04/2019 1234   Lab Results  Component Value Date   HGBA1C 5.3 11/03/2022   HGBA1C 5.3 02/07/2018   Lab Results  Component Value Date   INSULIN 9.9 11/03/2022   INSULIN 14.5 03/18/2022   Lab Results  Component Value  Date   TSH 2.750 11/03/2022   CBC    Component Value Date/Time   WBC 5.3 04/13/2022 0543   RBC 4.92 04/13/2022 0543   HGB 15.0 04/13/2022 0543   HGB 14.8 03/18/2022 0847   HCT 43.3 04/13/2022 0543   HCT 43.3 03/18/2022 0847   PLT 333 04/13/2022 0543   PLT 300 03/18/2022 0847   MCV 88.0 04/13/2022 0543   MCV 91 03/18/2022 0847   MCH 30.5 04/13/2022 0543   MCHC 34.6 04/13/2022 0543   RDW 12.9 04/13/2022 0543   RDW 12.6 03/18/2022 0847   Iron Studies No results found for: "IRON", "TIBC", "FERRITIN", "IRONPCTSAT" Lipid Panel     Component Value Date/Time   CHOL 126 11/03/2022 0906   TRIG 45 11/03/2022 0906   HDL 54 11/03/2022 0906   CHOLHDL 2.8 07/14/2021 0837   CHOLHDL 4.1 08/21/2019 0915   VLDL 18.4 07/26/2018 0803   LDLCALC 61 11/03/2022 0906   LDLCALC 133 (H) 08/21/2019 0915   LDLDIRECT 158.4 08/14/2012 0828   Hepatic Function Panel     Component Value Date/Time   PROT 6.8 11/03/2022 0906   ALBUMIN 4.4 11/03/2022 0906   AST 18 11/03/2022 0906   ALT 17 11/03/2022 0906   ALKPHOS 68 11/03/2022 0906   BILITOT 0.8 11/03/2022 0906   BILIDIR 0.25 03/10/2020 0935   IBILI 0.8 08/21/2019 0915      Component Value Date/Time   TSH 2.750 11/03/2022 0906   Nutritional Lab Results  Component Value Date   VD25OH 75.4 11/03/2022   VD25OH 58.7 03/18/2022    Attestations:   I, Special Puri, acting as a Stage manager for [employer], DO., have compiled all relevant documentation for today's office visit on behalf of Jillian Lot, DO, while in the presence of [employer], DO.  Reviewed by clinician on day of visit: allergies, medications, problem list, medical history, surgical history, family history, social history, and previous encounter notes pertinent to patient's obesity diagnosis. I  have spent 40 minutes in the care of the patient today including: preparing to see patient (e.g. review and interpretation of tests, old notes ), obtaining and/or reviewing  separately obtained history, performing a medically appropriate examination or evaluation, counseling and educating the patient, ordering medications, test or procedures, documenting clinical information in the electronic or other health care record, and independently interpreting results and communicating results to the patient, family, or caregiver   I have reviewed the above documentation for accuracy and completeness, and I agree with the above. Jillian Escobar,  D.O.  The 21st Century Cures Act was signed into law in 2016 which includes the topic of electronic health records.  This provides immediate access to information in MyChart.  This includes consultation notes, operative notes, office notes, lab results and pathology reports.  If you have any questions about what you read please let us know at your next visit so we can discuss your concerns and take corrective action if need be.  We are right here with you.

## 2022-11-26 ENCOUNTER — Encounter: Payer: Self-pay | Admitting: Family Medicine

## 2022-11-26 ENCOUNTER — Ambulatory Visit (INDEPENDENT_AMBULATORY_CARE_PROVIDER_SITE_OTHER): Payer: Medicare Other | Admitting: Family Medicine

## 2022-11-26 VITALS — BP 130/82 | HR 62 | Temp 97.0°F | Ht 63.0 in | Wt 161.8 lb

## 2022-11-26 DIAGNOSIS — M62838 Other muscle spasm: Secondary | ICD-10-CM | POA: Diagnosis not present

## 2022-11-26 DIAGNOSIS — E785 Hyperlipidemia, unspecified: Secondary | ICD-10-CM | POA: Diagnosis not present

## 2022-11-26 DIAGNOSIS — E039 Hypothyroidism, unspecified: Secondary | ICD-10-CM | POA: Diagnosis not present

## 2022-11-26 DIAGNOSIS — I10 Essential (primary) hypertension: Secondary | ICD-10-CM

## 2022-11-26 MED ORDER — LOSARTAN POTASSIUM 25 MG PO TABS: 12.5000 mg | ORAL_TABLET | Freq: Every day | ORAL | 3 refills | Status: DC

## 2022-11-26 MED ORDER — METAXALONE 400 MG PO TABS: 400.0000 mg | ORAL_TABLET | Freq: Every evening | ORAL | 0 refills | Status: DC | PRN

## 2022-11-26 NOTE — Progress Notes (Signed)
Phone [phone] In person visit   Subjective:   Jillian Escobar is a 72 y.o. year old very pleasant female patient who presents for/with See problem oriented charting Chief Complaint  Patient presents with   Medical Management of Chronic Issues   Hypertension   Past Medical History-  Patient Active Problem List   Diagnosis Date Noted   Hyperlipidemia, unspecified 05/08/2021    Priority: Medium    Aortic valve insufficiency 05/01/2020    Priority: Medium    Symptomatic PVCs 05/14/2019    Priority: Medium    Essential hypertension 01/11/2015    Priority: Medium    Fasting hyperglycemia 03/04/2014    Priority: Medium    Hepatic steatosis mild 11/23/2013    Priority: Medium    BPPV (benign paroxysmal positional vertigo) 11/10/2012    Priority: Medium    Family history of Alzheimer's disease 08/10/2011    Priority: Medium    Hypothyroidism 01/22/2008    Priority: Medium    GERD 01/22/2008    Priority: Medium    Pes anserine bursitis 03/11/2021    Priority: Low   Degenerative disc disease, lumbar 02/11/2021    Priority: Low   Somatic dysfunction of spine, sacral 02/11/2021    Priority: Low   Gastric polyps     Priority: Low   Family history of premature CAD 01/09/2015    Priority: Low   Exertional shortness of breath 12/04/2013    Priority: Low   Perimenopausal vasomotor symptoms 08/10/2011    Priority: Low   Post-nasal drainage 07/30/2011    Priority: Low   Cough, persistent recurrent 07/30/2011    Priority: Low   Postmenopausal HRT (hormone replacement therapy) 06/28/2010    Priority: Low   PLANTAR FASCIITIS, RIGHT 01/02/2008    Priority: Low   AC (acromioclavicular) arthritis 10/27/2022   Insulin resistance 04/12/2022   Vitamin D deficiency 04/12/2022   Class 1 obesity with serious comorbidity and body mass index (BMI) of 30.0 to 30.9 in adult 04/12/2022   Arthritis of midfoot 07/23/2021    Medications- reviewed and updated Current Outpatient  Medications  Medication Sig Dispense Refill   albuterol (VENTOLIN HFA) 108 (90 Base) MCG/ACT inhaler TAKE 2 PUFFS BY MOUTH EVERY 6 HOURS AS NEEDED FOR WHEEZE OR SHORTNESS OF BREATH (Patient taking differently: every 6 (six) hours as needed.) 6.7 each 1   atorvastatin (LIPITOR) 20 MG tablet TAKE 1 TABLET BY MOUTH EVERY DAY 90 tablet 2   B Complex Vitamins (B-COMPLEX/B-12) TABS Change to 1 every other day     calcium carbonate (OS-CAL) 600 MG TABS tablet      celecoxib (CELEBREX) 200 MG capsule Take 1 capsule (200 mg total) by mouth daily. (Patient taking differently: Take 200 mg by mouth daily as needed.) 30 capsule 0   cholecalciferol (VITAMIN D3) 25 MCG (1000 UNIT) tablet 1,000 IU every other day     ELDERBERRY PO daily.      famotidine (PEPCID) 20 MG tablet Take 20 mg by mouth as needed for heartburn or indigestion.     hydrochlorothiazide (MICROZIDE) 12.5 MG capsule TAKE 1 CAPSULE BY MOUTH EVERY DAY 90 capsule 3   KRILL OIL PO Take by mouth in the morning and at bedtime.     levothyroxine (SYNTHROID) 50 MCG tablet TAKE 1 TABLET BY MOUTH EVERY DAY 90 tablet 3   metaxalone (SKELAXIN) 400 MG tablet Take 1 tablet (400 mg total) by mouth at bedtime as needed. 20 tablet 0   Misc Natural Products (OSTEO BI-FLEX/5-LOXIN ADVANCED  PO) Take by mouth 2 (two) times daily at 8 am and 10 pm.     Multiple Vitamin (MULTIVITAMIN WITH MINERALS) TABS tablet Take 1 tablet by mouth daily.      OVER THE COUNTER MEDICATION Take 1 capsule by mouth daily. CBD oil 2500 mg     tamsulosin (FLOMAX) 0.4 MG CAPS capsule Take 0.4 mg by mouth daily as needed.     TART CHERRY PO Take 1 tablet by mouth daily.     Turmeric 500 MG TABS Take 1 tablet by mouth 2 (two) times daily.     vitamin A 3 MG (10000 UNITS) capsule Take 10,000 Units by mouth daily.     losartan (COZAAR) 25 MG tablet Take 0.5 tablets (12.5 mg total) by mouth daily. 45 tablet 3   No current facility-administered medications for this visit.     Objective:   BP 130/82   Pulse 62   Temp (!) 97 F (36.1 C)   Ht 5\' 3"  (1.6 m)   Wt 161 lb 12.8 oz (73.4 kg)   LMP  (LMP Unknown)   SpO2 98%   BMI 28.66 kg/m  Gen: NAD, resting comfortably CV: RRR no murmurs rubs or gallops Lungs: CTAB no crackles, wheeze, rhonchi Ext: no edema Skin: warm, dry     Assessment and Plan   # Social update-going on a river cruise over Christmas!  #hypertension and PVCs-sees Dr. Herbie Baltimore S: medication: Hydrochlorothiazide 12.5 mg, losartan 12.5 mg (started half tablet 11/09/2021) -PVCs typically controlled without beta-blockers-reduced triggers such as caffeine and sweets and staying well-hydrated BP Readings from Last 3 Encounters:  11/26/22 130/82  11/25/22 108/71  11/03/22 108/71  A/P: stable- continue current medicines    #hyperlipidemia- family history CAD S: Medication: Atorvastatin 20 mg daily, Krill oil Lab Results  Component Value Date   CHOL 126 11/03/2022   HDL 54 11/03/2022   LDLCALC 61 11/03/2022   LDLDIRECT 158.4 08/14/2012   TRIG 45 11/03/2022   CHOLHDL 2.8 07/14/2021  A/P: cholesterol looks fantastic- continue current medications   #hypothyroidism S: compliant On thyroid medication-levothyroxine 50 mcg -Reassuring thyroid ultrasound 03/03/21-3 mm nodule with no follow-up recommended Lab Results  Component Value Date   TSH 2.750 11/03/2022  A/P:stable- continue current medicines     # Hyperglycemia/insulin resistance/prediabetes-fasting blood sugars elevated in the past but A1c's have been okay S:  Medication: none Lab Results  Component Value Date   HGBA1C 5.3 11/03/2022   HGBA1C 5.5 03/18/2022   HGBA1C 5.4 07/26/2018  A/P: a1c looks great- she is doing phenomenal with weight loss   # GERD- less and less with weight loss   #Fatty liver-- previously noted on imaging but LFts normal  Lab Results  Component Value Date   ALT 17 11/03/2022   AST 18 11/03/2022   ALKPHOS 68 11/03/2022   BILITOT 0.8 11/03/2022    #Musculoskeletal concerns-has been seen by Dr. Katrinka Blazing of [facility] in the past-knee pain, degenerative disc disease, hand arthritis -did well with shoulder injection - reasonably well about 75% better but since then has seemed to have issues one night after waking up with neck stiffness - took some advil today and helped some- may take another -having some tougher nights of sleep keeping her from sleeping well - recommend trial skelaxin/metaxalone before bed to see if this can help her rest better-she is very tight on exam on the right side of her neck  Recommended follow up: Return in about 6 months (around 05/26/2023)  for followup or sooner if needed.Schedule b4 you leave. Future Appointments  Date Time Provider Department Center  12/01/2022  9:00 AM [facility] None  12/14/2022 12:45 PM Judi Saa, DO [facility] None  01/11/2023 11:00 AM Thomasene Lot, DO [facility] None  02/25/2023 11:20 AM Ronney Asters, NP [facility] None  02/28/2023 11:50 AM [facility] MM 3 [facility]  05/27/2023 11:00 AM Shelva Majestic, MD [facility]  07/25/2023  8:15 AM [facility] ANNUAL WELLNESS VISIT 1 [facility]    Lab/Order associations:   ICD-10-CM   1. Essential hypertension  I10     2. Hyperlipidemia, unspecified hyperlipidemia type  E78.5     3. Hypothyroidism, unspecified type  E03.9       Meds ordered this encounter  Medications   losartan (COZAAR) 25 MG tablet    Sig: Take 0.5 tablets (12.5 mg total) by mouth daily.    Dispense:  45 tablet    Refill:  3   metaxalone (SKELAXIN) 400 MG tablet    Sig: Take 1 tablet (400 mg total) by mouth at bedtime as needed.    Dispense:  20 tablet    Refill:  0    Return precautions advised.  Tana Conch, MD

## 2022-11-26 NOTE — Patient Instructions (Addendum)
-   recommend trial skelaxin/metaxalone before bed to see if this can help her rest better  Glad you are doing so well other than the neck  Husband can schedule as new patient   Recommended follow up: Return in about 6 months (around 05/26/2023) for followup or sooner if needed.Schedule b4 you leave.

## 2022-12-01 ENCOUNTER — Other Ambulatory Visit (HOSPITAL_COMMUNITY): Payer: Medicare Other

## 2022-12-09 NOTE — Progress Notes (Signed)
[name] [address] [place] [address] Phone: [phone] Subjective:    I'm seeing this patient by the request  of:  Shelva Majestic, MD  CC: Back and neck pain follow-up, foot pain follow-up  [id number]  Jillian Escobar is a 72 y.o. female coming in with complaint of back and neck pain. OMT on 10/27/2022. Also seen for shoulder pain. Patient states a week after injection started having neck pain was given a muscle relaxer and went for a massage that helped. Shoulder pain has been good , but last night pain started again . Would like a r foot injection   Medications patient has been prescribed:   Taking:         Reviewed prior external information including notes and imaging from previsou exam, outside providers and external EMR if available.   As well as notes that were available from care everywhere and other healthcare systems.  Past medical history, social, surgical and family history all reviewed in electronic medical record.  No pertanent information unless stated regarding to the chief complaint.   Past Medical History:  Diagnosis Date   Allergy    Aortic valve insufficiency    Arthritis    GERD (gastroesophageal reflux disease)    Hx of varicella    Hyperlipidemia    borderline   Hypertension    Hypothyroidism    Pneumonia 11/2014   Only time took inhalers to treat   RBBB 10/2019   recently seen at [facility] - f/u 6 mons, no treatment   Sacral back pain    Shingles 10/26/2010   Syncope and collapse 10/2019   Unclear etiology - just saw [facility] - f/u 6 months - no treatment   Thyroid disease     Allergies  Allergen Reactions   Levaquin [Levofloxacin]     See notes  September 2019  Palpitations irritability sleep issues       Review of Systems:  No headache, visual changes, nausea, vomiting, diarrhea, constipation, dizziness, abdominal pain, skin rash, fevers, chills, night sweats, weight loss, swollen lymph nodes,  body aches, joint swelling, chest pain, shortness of breath, mood changes. POSITIVE muscle aches  Objective  Blood pressure 122/72, pulse 78, height 5\' 3"  (1.6 m), weight 161 lb (73 kg), SpO2 98%.   General: No apparent distress alert and oriented x3 mood and affect normal, dressed appropriately.  HEENT: Pupils equal, extraocular movements intact  Respiratory: Patient's speak in full sentences and does not appear short of breath  Cardiovascular: No lower extremity edema, non tender, no erythema  Gait mild antalgic MSK:  Back does have some loss of lordosis noted.  Some tenderness to palpation diffusely.  Mild increase in kyphosis of the upper back noted.  Right  foot exam shows rigid midfoot.  No swelling noted over the midfoot.  Procedure: Real-time Ultrasound Guided Injection of right midfoot Device: GE Logiq Q7 Ultrasound guided injection is preferred based studies that show increased duration, increased effect, greater accuracy, decreased procedural pain, increased response rate, and decreased cost with ultrasound guided versus blind injection.  Verbal informed consent obtained.  Time-out conducted.  Noted no overlying erythema, induration, or other signs of local infection.  Skin prepped in a sterile fashion.  Local anesthesia: Topical Ethyl chloride.  With sterile technique and under real time ultrasound guidance: With a 25-gauge half inch needle injected with 0.5 cc of 0.5% Marcaine 0.5 cc of Kenalog 40 mg/mL into the midfoot Completed without difficulty  Pain immediately resolved suggesting accurate placement of the medication.  Advised to call if fevers/chills, erythema, induration, drainage, or persistent bleeding.  Impression: Technically successful ultrasound guided injection.  Osteopathic findings  C5 flexed rotated and side bent left T3 extended rotated and side bent right inhaled rib T9 extended rotated and side bent left L3 flexed rotated and side bent left  Sacrum  right on right    Assessment and Plan:  Arthritis of midfoot Repeat injection given today, tolerated the procedure well, discussed icing regimen and home exercises, discussed avoiding certain activities.  Discussed proper shoes.  Discussed over-the-counter orthotics.  I have some other orthotics that I think will be okay at the moment the patient did show me.  Follow-up again in 6 to 8 weeks.    Nonallopathic problems  Decision today to treat with OMT was based on Physical Exam  After verbal consent patient was treated with HVLA, ME, FPR techniques in cervical, rib, thoracic, lumbar, and sacral  areas  Patient tolerated the procedure well with improvement in symptoms  Patient given exercises, stretches and lifestyle modifications  See medications in patient instructions if given  Patient will follow up in 4-8 weeks     The above documentation has been reviewed and is accurate and complete Judi Saa, DO         Note: This dictation was prepared with Dragon dictation along with smaller phrase technology. Any transcriptional errors that result from this process are unintentional.

## 2022-12-14 ENCOUNTER — Encounter: Payer: Self-pay | Admitting: Family Medicine

## 2022-12-14 ENCOUNTER — Other Ambulatory Visit: Payer: Self-pay

## 2022-12-14 ENCOUNTER — Ambulatory Visit: Payer: Medicare Other | Admitting: Family Medicine

## 2022-12-14 VITALS — BP 122/72 | HR 78 | Ht 63.0 in | Wt 161.0 lb

## 2022-12-14 DIAGNOSIS — M19072 Primary osteoarthritis, left ankle and foot: Secondary | ICD-10-CM | POA: Diagnosis not present

## 2022-12-14 DIAGNOSIS — M9908 Segmental and somatic dysfunction of rib cage: Secondary | ICD-10-CM

## 2022-12-14 DIAGNOSIS — M9902 Segmental and somatic dysfunction of thoracic region: Secondary | ICD-10-CM

## 2022-12-14 DIAGNOSIS — M9901 Segmental and somatic dysfunction of cervical region: Secondary | ICD-10-CM | POA: Diagnosis not present

## 2022-12-14 DIAGNOSIS — M79671 Pain in right foot: Secondary | ICD-10-CM

## 2022-12-14 DIAGNOSIS — M9903 Segmental and somatic dysfunction of lumbar region: Secondary | ICD-10-CM

## 2022-12-14 DIAGNOSIS — M9904 Segmental and somatic dysfunction of sacral region: Secondary | ICD-10-CM | POA: Diagnosis not present

## 2022-12-14 DIAGNOSIS — M51362 Other intervertebral disc degeneration, lumbar region with discogenic back pain and lower extremity pain: Secondary | ICD-10-CM

## 2022-12-14 NOTE — Patient Instructions (Addendum)
Injected foot See me again in 2 months

## 2022-12-14 NOTE — Assessment & Plan Note (Signed)
Repeat injection given today, tolerated the procedure well, discussed icing regimen and home exercises, discussed avoiding certain activities.  Discussed proper shoes.  Discussed over-the-counter orthotics.  I have some other orthotics that I think will be okay at the moment the patient did show me.  Follow-up again in 6 to 8 weeks.

## 2022-12-14 NOTE — Assessment & Plan Note (Signed)
Chronic problem with exacerbation.  Muscle relaxer Skelaxin 800 mg taken up to 3 times a day.  Discussed avoiding certain activities.  Follow-up with me again in 6 to 8 weeks.

## 2023-01-07 ENCOUNTER — Other Ambulatory Visit (HOSPITAL_COMMUNITY)
Admission: RE | Admit: 2023-01-07 | Discharge: 2023-01-07 | Disposition: A | Discharge status: Home / Self Care | Payer: Self-pay | Source: Ambulatory Visit | Attending: Medical Genetics | Admitting: Medical Genetics

## 2023-01-07 DIAGNOSIS — Z006 Encounter for examination for normal comparison and control in clinical research program: Secondary | ICD-10-CM | POA: Insufficient documentation

## 2023-01-11 ENCOUNTER — Ambulatory Visit (INDEPENDENT_AMBULATORY_CARE_PROVIDER_SITE_OTHER): Payer: Medicare Other | Admitting: Family Medicine

## 2023-01-11 ENCOUNTER — Encounter (INDEPENDENT_AMBULATORY_CARE_PROVIDER_SITE_OTHER): Payer: Self-pay | Admitting: Family Medicine

## 2023-01-11 VITALS — BP 133/74 | HR 64 | Temp 97.9°F | Ht 63.0 in | Wt 160.0 lb

## 2023-01-11 DIAGNOSIS — E559 Vitamin D deficiency, unspecified: Secondary | ICD-10-CM

## 2023-01-11 DIAGNOSIS — E669 Obesity, unspecified: Secondary | ICD-10-CM

## 2023-01-11 DIAGNOSIS — E88819 Insulin resistance, unspecified: Secondary | ICD-10-CM | POA: Diagnosis not present

## 2023-01-11 DIAGNOSIS — Z6827 Body mass index (BMI) 27.0-27.9, adult: Secondary | ICD-10-CM

## 2023-01-11 DIAGNOSIS — I10 Essential (primary) hypertension: Secondary | ICD-10-CM

## 2023-01-11 DIAGNOSIS — Z6828 Body mass index (BMI) 28.0-28.9, adult: Secondary | ICD-10-CM

## 2023-01-11 DIAGNOSIS — Z6830 Body mass index (BMI) 30.0-30.9, adult: Secondary | ICD-10-CM

## 2023-01-11 NOTE — Progress Notes (Signed)
 Jillian Escobar, D.O.  ABFM, ABOM Specializing in Clinical Bariatric Medicine  Office located at: [address] [place], [place]  [address]   Assessment and Plan:   FOR THE DISEASE OF OBESITY: BMI 27.0-27.9,adult-current BMi  28.35 Obesity, with starting BMI of 30.11 Assessment & Plan: Since last office visit on 11/25/22 patient's  Muscle mass has increased by 1 lb. Fat mass has increased by 1.4 lb. Total body water has decreased by 0.4 lb.  Counseling done on how various foods will affect these numbers and how to maximize success  Total lbs lost to date: 10  Total weight loss percentage to date: 5.88%    Recommended Dietary Goals [name] is currently in the action stage of change. As such, her goal is to continue weight management plan.  She has agreed to: continue current plan   Behavioral Intervention We discussed the following today: keeping healthy foods at home and discussed low calorie options for satisfying sweet tooth cravings;  ie dove dark chocolates, fiber one brownies  Additional resources provided today:  handout on journaling log, handout on Metformin    Evidence-based interventions for health behavior change were utilized today including the discussion of self monitoring techniques, problem-solving barriers and SMART goal setting techniques.   Regarding patient's less desirable eating habits and patterns, we employed the technique of small changes.   Pt will specifically work on: n/a   Recommended Physical Activity Goals [name] has been advised to work up to 150 minutes of moderate intensity aerobic activity a week and strengthening exercises 2-3 times per week for cardiovascular health, weight loss maintenance and preservation of muscle mass.   She has agreed to : Continue current level of physical activity    Pharmacotherapy We both agreed to : continue with nutritional and behavioral strategies   FOR ASSOCIATED CONDITIONS ADDRESSED  TODAY:  Insulin  resistance Assessment & Plan: Most recent Hemoglobin A1c and fasting insulin :  Lab Results  Component Value Date   HGBA1C 5.3 11/03/2022   HGBA1C 5.5 03/18/2022   HGBA1C 5.4 07/26/2018   INSULIN  9.9 11/03/2022   INSULIN  14.5 03/18/2022    No current meds. Diet/exercise approach. She endorses sometimes having sweet cravings at night. We discussed low calorie options for satisfying sweet tooth cravings. Reminded patient that having adequate amounts of  protein with each meal is important for controlling hunger and cravings. We may consider starting Metformin  in the future - handout on Metformin  provided.   Essential hypertension Assessment & Plan: Last 3 blood pressure readings in our office are as follows: BP Readings from Last 3 Encounters:  01/11/23 133/74  12/14/22 122/72  11/26/22 130/82   HTN treated with Cozaar  and Microzide . Blood pressure stable - no concerns in this regard today. Reminded pt about maintaining adequate hydration. Continue with Prudent nutritional plan and low sodium diet, advance exercise as tolerated.  Continue with all antihypertensives.     Vitamin D  deficiency Assessment & Plan: Most recent Vitamin D :  Lab Results  Component Value Date   VD25OH 75.4 11/03/2022   VD25OH 58.7 03/18/2022   Pt has been taking OTC vit D 1,000 international units once every other day. Continue with current supplementation regiment. Will recheck labs in 1 month or so.    Follow up:   Return 02/09/2023. She was informed of the importance of frequent follow up visits to maximize her success with intensive lifestyle modifications for her multiple health conditions.  Subjective:   Chief complaint: Obesity Jillian Escobar is here to  discuss her progress with her obesity treatment plan. She is on the Category 1 Plan and keeping a food journal and adhering to recommended goals of [phone] calories and 80+ grams of protein and states she is following her eating plan  approximately 50% of the time. She states she is walking 45 minutes 7 days per week.  Interval History:  [name] is here for a follow up office visit. Pt returned from an excellent river cruise trip. During the trip, she endorses having carbohydrate rich foods. She has been back home for the past week and is still adjusting to the time change. She endorses having sweet cravings at night and requests ideas for healthier sweet alternatives. She has a 1 week trip to [facility] coming up.   Pharmacotherapy for weight loss: She is currently taking no anti-obesity medication.   Review of Systems:  Pertinent positives were addressed with patient today.  Reviewed by clinician on day of visit: allergies, medications, problem list, medical history, surgical history, family history, social history, and previous encounter notes.  Weight Summary and Biometrics   Weight Lost Since Last Visit: 0lb  Weight Gained Since Last Visit: 3lb  Vitals Temp: 97.9 F (36.6 C) BP: 133/74 Pulse Rate: 64 SpO2: 98 %   Anthropometric Measurements Height: 5' 3 (1.6 m) Weight: 160 lb (72.6 kg) BMI (Calculated): 28.35 Weight at Last Visit: 157lb Weight Lost Since Last Visit: 0lb Weight Gained Since Last Visit: 3lb Starting Weight: 170lb Total Weight Loss (lbs): 10 lb (4.536 kg) Peak Weight: 197lb   Body Composition  Body Fat %: 38.7 % Fat Mass (lbs): 62 lbs Muscle Mass (lbs): 93 lbs Total Body Water (lbs): 65.4 lbs Visceral Fat Rating : 11   Other Clinical Data Fasting: no Labs: no Today's Visit #: 10 Starting Date: 03/18/22   Objective:   PHYSICAL EXAM: Blood pressure 133/74, pulse 64, temperature 97.9 F (36.6 C), height 5' 3 (1.6 m), weight 160 lb (72.6 kg), SpO2 98%. Body mass index is 28.34 kg/m.  General: she is overweight, cooperative and in no acute distress. PSYCH: Has normal mood, affect and thought process.   HEENT: EOMI, sclerae are anicteric. Lungs: Normal  breathing effort, no conversational dyspnea. Extremities: Moves * 4 Neurologic: A and O * 3, good insight  DIAGNOSTIC DATA REVIEWED: BMET    Component Value Date/Time   NA 140 11/03/2022 0906   K 4.0 11/03/2022 0906   CL 103 11/03/2022 0906   CO2 23 11/03/2022 0906   GLUCOSE 101 (H) 11/03/2022 0906   GLUCOSE 114 (H) 04/13/2022 0543   BUN 22 11/03/2022 0906   CREATININE 0.75 11/03/2022 0906   CALCIUM  10.2 11/03/2022 0906   GFRNONAA >60 04/13/2022 0543   GFRAA 73 04/04/2019 1234   Lab Results  Component Value Date   HGBA1C 5.3 11/03/2022   HGBA1C 5.3 02/07/2018   Lab Results  Component Value Date   INSULIN  9.9 11/03/2022   INSULIN  14.5 03/18/2022   Lab Results  Component Value Date   TSH 2.750 11/03/2022   CBC    Component Value Date/Time   WBC 5.3 04/13/2022 0543   RBC 4.92 04/13/2022 0543   HGB 15.0 04/13/2022 0543   HGB 14.8 03/18/2022 0847   HCT 43.3 04/13/2022 0543   HCT 43.3 03/18/2022 0847   PLT 333 04/13/2022 0543   PLT 300 03/18/2022 0847   MCV 88.0 04/13/2022 0543   MCV 91 03/18/2022 0847   MCH 30.5 04/13/2022 0543   MCHC 34.6  04/13/2022 0543   RDW 12.9 04/13/2022 0543   RDW 12.6 03/18/2022 0847   Iron Studies No results found for: IRON, TIBC, FERRITIN, IRONPCTSAT Lipid Panel     Component Value Date/Time   CHOL 126 11/03/2022 0906   TRIG 45 11/03/2022 0906   HDL 54 11/03/2022 0906   CHOLHDL 2.8 07/14/2021 0837   CHOLHDL 4.1 08/21/2019 0915   VLDL 18.4 07/26/2018 0803   LDLCALC 61 11/03/2022 0906   LDLCALC 133 (H) 08/21/2019 0915   LDLDIRECT 158.4 08/14/2012 0828   Hepatic Function Panel     Component Value Date/Time   PROT 6.8 11/03/2022 0906   ALBUMIN 4.4 11/03/2022 0906   AST 18 11/03/2022 0906   ALT 17 11/03/2022 0906   ALKPHOS 68 11/03/2022 0906   BILITOT 0.8 11/03/2022 0906   BILIDIR 0.25 03/10/2020 0935   IBILI 0.8 08/21/2019 0915      Component Value Date/Time   TSH 2.750 11/03/2022 0906   Nutritional Lab  Results  Component Value Date   VD25OH 75.4 11/03/2022   VD25OH 58.7 03/18/2022    Attestations:   I, Special Puri, acting as a stage manager for [employer], DO., have compiled all relevant documentation for today's office visit on behalf of Jillian Jenkins, DO, while in the presence of [employer], DO.  I have reviewed the above documentation for accuracy and completeness, and I agree with the above. Jillian JINNY Escobar, D.O.  The 21st Century Cures Act was signed into law in 2016 which includes the topic of electronic health records.  This provides immediate access to information in MyChart.  This includes consultation notes, operative notes, office notes, lab results and pathology reports.  If you have any questions about what you read please let us  know at your next visit so we can discuss your concerns and take corrective action if need be.  We are right here with you.

## 2023-01-24 LAB — GENECONNECT MOLECULAR SCREEN: Genetic Analysis Overall Interpretation: NEGATIVE

## 2023-01-28 NOTE — Progress Notes (Unsigned)
   Rubin Payor, PhD, LAT, ATC acting as a scribe for Jillian Graham, MD.  Jillian Escobar is a 73 y.o. female who presents to [facility] at [facility] today for L foot pain. Pt was previously seen by Dr. Katrinka Blazing on 12/14/22 for R foot pain and OMT.  Today, pt c/o L foot pain x 1 wk. Pt was in [place] and got up to use the bathroom in the middle of the night and hit her L foot/toe on a chair. Pt locates pain to the 4th toe on her L foot and into the 4th MT. She treated with a toe wrap buddy tape and comfortable well supportive footwear and her toes feeling better.  Swelling: yes- w/ ecchymosis Treatments tried: ice, arnica, elevation, toe wrap, IBU  Pertinent review of systems: No fevers or chills  Relevant historical information: Hypertension   Exam:  BP 128/84   Pulse 98   Ht 5\' 3"  (1.6 m)   Wt 168 lb (76.2 kg)   LMP  (LMP Unknown)   SpO2 (!) 72%   BMI 29.76 kg/m  General: Well Developed, well nourished, and in no acute distress.   MSK: Left foot bruising present around the forefoot.  Tender palpation fourth toe.  No deformity visible.  Capillary fill and sensation is intact distally.    Lab and Radiology Results  X-ray images left foot obtained today personally and independently interpreted. Oblique fracture at the midshaft of the proximal phalanx fourth toe.  No significant angulation or displacement. Await formal radiology review    Assessment and Plan: 73 y.o. female with toe fracture left fourth toe.  She has treated it completely correctly independently with effectively buddy tape and a comfortable rigid shoe.  I do not think at this point we need to do anything different.  She could use a postop shoe if needed but she finds her current shoes to be pretty comfortable.  Plan to advance activity as tolerated and continue buddy taping.  She has a scheduled visit with my partner Dr. Katrinka Blazing on February 12 which should be about right for recheck this  fracture.   PDMP not reviewed this encounter. Orders Placed This Encounter  Procedures   DG Foot Complete Left    Standing Status:   Future    Number of Occurrences:   1    Expiration Date:   01/31/2024    Reason for Exam (SYMPTOM  OR DIAGNOSIS REQUIRED):   left foot pain    Preferred imaging location?:   [place]   No orders of the defined types were placed in this encounter.    Discussed warning signs or symptoms. Please see discharge instructions. Patient expresses understanding.   The above documentation has been reviewed and is accurate and complete Jillian Escobar, M.D.

## 2023-01-31 ENCOUNTER — Ambulatory Visit (INDEPENDENT_AMBULATORY_CARE_PROVIDER_SITE_OTHER): Payer: Medicare Other | Admitting: Family Medicine

## 2023-01-31 ENCOUNTER — Ambulatory Visit (INDEPENDENT_AMBULATORY_CARE_PROVIDER_SITE_OTHER): Payer: Medicare Other

## 2023-01-31 VITALS — BP 128/84 | HR 98 | Ht 63.0 in | Wt 168.0 lb

## 2023-01-31 DIAGNOSIS — M79672 Pain in left foot: Secondary | ICD-10-CM | POA: Diagnosis not present

## 2023-01-31 DIAGNOSIS — S92515A Nondisplaced fracture of proximal phalanx of left lesser toe(s), initial encounter for closed fracture: Secondary | ICD-10-CM

## 2023-02-07 ENCOUNTER — Encounter: Payer: Self-pay | Admitting: Family Medicine

## 2023-02-07 NOTE — Progress Notes (Signed)
Left foot x-ray shows a fourth toe fracture distally we talked about in clinic.  Continue buddy taping.

## 2023-02-09 ENCOUNTER — Ambulatory Visit (INDEPENDENT_AMBULATORY_CARE_PROVIDER_SITE_OTHER): Payer: Medicare Other | Admitting: Family Medicine

## 2023-02-12 ENCOUNTER — Other Ambulatory Visit: Payer: Self-pay | Admitting: Family Medicine

## 2023-02-14 NOTE — Progress Notes (Signed)
[name] [address] [place] [address] Phone: [phone] Subjective:   INadine Counts, am serving as a scribe for Dr. Antoine Primas.  I'm seeing this patient by the request  of:  Shelva Majestic, MD  CC: neck and back pain follow up   [id number]  Jillian Escobar is a 73 y.o. female coming in with complaint of back and neck pain. OMT 12/14/2022. Also f/u L foot fx that she saw Dr. Denyse Amass for on 01/31/2023. 4th mtp minimally displaced. Patient states feeling just a bit better. No boot just coband.  Medications patient has been prescribed:   Taking:         Reviewed prior external information including notes and imaging from previsou exam, outside providers and external EMR if available.   As well as notes that were available from care everywhere and other healthcare systems.  Past medical history, social, surgical and family history all reviewed in electronic medical record.  No pertanent information unless stated regarding to the chief complaint.   Past Medical History:  Diagnosis Date   Allergy    Aortic valve insufficiency    Arthritis    GERD (gastroesophageal reflux disease)    Hx of varicella    Hyperlipidemia    borderline   Hypertension    Hypothyroidism    Pneumonia 11/2014   Only time took inhalers to treat   RBBB 10/2019   recently seen at [facility] - f/u 6 mons, no treatment   Sacral back pain    Shingles 10/26/2010   Syncope and collapse 10/2019   Unclear etiology - just saw [facility] - f/u 6 months - no treatment   Thyroid disease     Allergies  Allergen Reactions   Levaquin [Levofloxacin]     See notes  September 2019  Palpitations irritability sleep issues       Review of Systems:  No headache, visual changes, nausea, vomiting, diarrhea, constipation, dizziness, abdominal pain, skin rash, fevers, chills, night sweats, weight loss, swollen lymph nodes, body aches, joint swelling, chest pain, shortness of  breath, mood changes. POSITIVE muscle aches  Objective  Blood pressure 128/72, pulse 77, height 5\' 3"  (1.6 m), weight 165 lb (74.8 kg), SpO2 98%.   General: No apparent distress alert and oriented x3 mood and affect normal, dressed appropriately.  HEENT: Pupils equal, extraocular movements intact  Respiratory: Patient's speak in full sentences and does not appear short of breath  Cardiovascular: No lower extremity edema, non tender, no erythema  Foot exam of the left foot does have some breakdown of the transverse arch noted.  Still tender to palpation over the fifth and fourth metatarsals.  Limited muscular skeletal ultrasound was performed and interpreted by Antoine Primas, M   Limited ultrasound shows a patient does have no significant callus formation noted and does have angulation noted.  Continued mild hypoechoic changes in the surrounding area of the cortical irregularity.     Assessment and Plan:  Closed fracture of fourth toe of left foot Patient has had delayed healing noted on x-ray as well as an ultrasound.  Mild increase in angulation and displacement noted.  Rest icing regimen and home exercises.  We discussed which activities to do and which ones to avoid.  Follow-up with me again 3 weeks.  Put in a postop boot        The above documentation has been reviewed and is accurate and complete Judi Saa, DO  Note: This dictation was prepared with Dragon dictation along with smaller phrase technology. Any transcriptional errors that result from this process are unintentional.

## 2023-02-16 ENCOUNTER — Ambulatory Visit (INDEPENDENT_AMBULATORY_CARE_PROVIDER_SITE_OTHER): Payer: Medicare Other

## 2023-02-16 ENCOUNTER — Encounter: Payer: Self-pay | Admitting: Family Medicine

## 2023-02-16 ENCOUNTER — Other Ambulatory Visit: Payer: Self-pay

## 2023-02-16 ENCOUNTER — Ambulatory Visit: Payer: Medicare Other | Admitting: Family Medicine

## 2023-02-16 VITALS — BP 128/72 | HR 77 | Ht 63.0 in | Wt 165.0 lb

## 2023-02-16 DIAGNOSIS — S92502G Displaced unspecified fracture of left lesser toe(s), subsequent encounter for fracture with delayed healing: Secondary | ICD-10-CM | POA: Diagnosis not present

## 2023-02-16 DIAGNOSIS — S92502A Displaced unspecified fracture of left lesser toe(s), initial encounter for closed fracture: Secondary | ICD-10-CM | POA: Insufficient documentation

## 2023-02-16 DIAGNOSIS — S92515A Nondisplaced fracture of proximal phalanx of left lesser toe(s), initial encounter for closed fracture: Secondary | ICD-10-CM

## 2023-02-16 MED ORDER — VITAMIN D (ERGOCALCIFEROL) 1.25 MG (50000 UNIT) PO CAPS: 50000.0000 [IU] | ORAL_CAPSULE | ORAL | 0 refills | Status: DC

## 2023-02-16 NOTE — Patient Instructions (Addendum)
See you again in 2-3 weeks Vit D once weekly

## 2023-02-16 NOTE — Assessment & Plan Note (Signed)
Patient has had delayed healing noted on x-ray as well as an ultrasound.  Mild increase in angulation and displacement noted.  Rest icing regimen and home exercises.  We discussed which activities to do and which ones to avoid.  Follow-up with me again 3 weeks.  Put in a postop boot

## 2023-02-17 ENCOUNTER — Encounter: Payer: Self-pay | Admitting: Family Medicine

## 2023-02-23 ENCOUNTER — Encounter (INDEPENDENT_AMBULATORY_CARE_PROVIDER_SITE_OTHER): Payer: Self-pay

## 2023-02-23 ENCOUNTER — Ambulatory Visit (INDEPENDENT_AMBULATORY_CARE_PROVIDER_SITE_OTHER): Payer: Medicare Other | Admitting: Family Medicine

## 2023-02-24 NOTE — Progress Notes (Unsigned)
Cardiology Office Note:    Date:  02/25/2023  ID:  Jillian Escobar, DOB [birth year], MRN [id number] PCP: Shelva Majestic, MD   HeartCare Providers Cardiologist:  Bryan Lemma, MD       Patient Profile:      Jillian Escobar is a 73 y.o. female with visit-pertinent history of palpitations, rare PACs/PVCs noted on monitor in 2021, bifascicular block, recurrent syncope in setting of dehydration, mild to moderate aortic insufficiency, hypertension, hyperlipidemia, hypothyroidism, tobacco abuse, FH of premature CAD  Patient referred to cardiology service in January 2017 after recent ED visit for syncope.  During that visit she reported 3 episodes of syncope over the last several years.  She did note some exertional chest discomfort and dyspnea.  Treadmill Myoview was ordered showing low risk with no evidence of ischemia.  Syncope was felt to likely be neurocardiogenic in nature based on dehydration.  On 11/23/2017 PCP ordered echo for further evaluation of murmur and that showed normal LV function and mild to moderate aortic insufficiency.  Monitor in April 21 for further evaluation palpitation showed underlying sinus rhythm with intermediate bundle branch block and rare PACs/PVCs with no significant arrhythmias.  Echocardiogram in October 22 for routine follow-up of aortic insufficiency was unchanged and showed LVEF of 60 to 65% with grade 2 diastolic dysfunction and mild to moderate aortic insufficiency.  She was seen in clinic on 01/2021, at that time she was doing well from a cardiac standpoint.  Of note patient does have family history of premature CAD.  It was discussed potentially ordering a coronary calcium score however decision was made to hold off given controlled BP and lipids.  Patient was last seen in clinic on 02/23/2022.  She was doing well from a cardiac standpoint.  She did report some dyspnea on exertion walking up inclines/stairs but this is not new and is stable.  She noted  occasional lightheadedness/dizziness with position changes.  She noted last syncopal episode was several years ago and occurred in the setting of being outside in the sun, drinking alcohol, smoking, sitting in a hot tub which was likely in the setting of volume depletion/dehydration.  She had no recurrent single episode since that time.  She noted some intermittent jaw pain with indigestion after eating certain meals that resolves with Pepcid.  Due to FH of CAD CT cardiac scoring was ordered and completed on 03/24/2022 showing coronary Score of 0 and aortic atherosclerosis of ascending aorta.      History of Present Illness:  Discussed the use of AI scribe software for clinical note transcription with the patient, who gave verbal consent to proceed.  Jillian Escobar is a 73 y.o. female who returns for 1 year follow-up for palpitations, bifascicular block, aortic insufficiency, syncope.  Patient comes into the clinic today by herself.  She notes over the past year she has had no major cardiovascular concerns or complaints.  She denies any chest pain, exertional angina, dyspnea, orthopnea, PND, palpitations.  She has not experienced any episodes of syncope over the past year, she attributes her past episodes to marijuana use and dehydration.  She also mentions a recent toe injury, which has limited her physical activity and contributed to some weight gain.  She works out at [employer] and does not experience any exertional angina.  She does drink alcohol and specifically bourbon.    Review of Systems  Constitutional: Negative for weight gain and weight loss.  Cardiovascular:  Negative for chest pain, claudication, dyspnea  on exertion, irregular heartbeat, leg swelling, near-syncope, orthopnea, palpitations, paroxysmal nocturnal dyspnea and syncope.  Respiratory:  Negative for cough, hemoptysis and shortness of breath.   Gastrointestinal:  Negative for abdominal pain, hematochezia and melena.   Genitourinary:  Negative for hematuria.  Neurological:  Negative for dizziness and light-headedness.     See HPI     Home Medications:    Prior to Admission medications   Medication Sig Start Date End Date Taking? Authorizing Provider  albuterol (VENTOLIN HFA) 108 (90 Base) MCG/ACT inhaler TAKE 2 PUFFS BY MOUTH EVERY 6 HOURS AS NEEDED FOR WHEEZE OR SHORTNESS OF BREATH Patient taking differently: every 6 (six) hours as needed. 10/22/19   Panosh, Neta Mends, MD  atorvastatin (LIPITOR) 20 MG tablet TAKE 1 TABLET BY MOUTH EVERY DAY 08/04/22   Marykay Lex, MD  B Complex Vitamins (B-COMPLEX/B-12) TABS Change to 1 every other day 11/25/22   Thomasene Lot, DO  calcium carbonate (OS-CAL) 600 MG TABS tablet     [provider]  celecoxib (CELEBREX) 200 MG capsule Take 1 capsule (200 mg total) by mouth daily. Patient not taking: Reported on 01/31/2023 02/11/21   Judi Saa, DO  cholecalciferol (VITAMIN D3) 25 MCG (1000 UNIT) tablet 1,000 IU every other day 11/25/22   Thomasene Lot, DO  ELDERBERRY PO daily.     [provider]  famotidine (PEPCID) 20 MG tablet Take 20 mg by mouth as needed for heartburn or indigestion.    [provider]  hydrochlorothiazide (MICROZIDE) 12.5 MG capsule TAKE 1 CAPSULE BY MOUTH EVERY DAY 06/28/22   Marykay Lex, MD  KRILL OIL PO Take by mouth in the morning and at bedtime.    [provider]  levothyroxine (SYNTHROID) 50 MCG tablet TAKE 1 TABLET BY MOUTH EVERY DAY 02/14/23   Shelva Majestic, MD  losartan (COZAAR) 25 MG tablet Take 0.5 tablets (12.5 mg total) by mouth daily. 11/26/22   Shelva Majestic, MD  metaxalone (SKELAXIN) 400 MG tablet Take 1 tablet (400 mg total) by mouth at bedtime as needed. 11/26/22   Shelva Majestic, MD  Misc Natural Products (OSTEO BI-FLEX/5-LOXIN ADVANCED PO) Take by mouth 2 (two) times daily at 8 am and 10 pm.    [provider]  Multiple Vitamin (MULTIVITAMIN WITH MINERALS)  TABS tablet Take 1 tablet by mouth daily.     [provider]  OVER THE COUNTER MEDICATION Take 1 capsule by mouth daily. CBD oil 2500 mg    [provider]  tamsulosin (FLOMAX) 0.4 MG CAPS capsule Take 0.4 mg by mouth daily as needed. 10/12/22   [provider]  TART CHERRY PO Take 1 tablet by mouth daily.    [provider]  Turmeric 500 MG TABS Take 1 tablet by mouth 2 (two) times daily.    [provider]  vitamin A 3 MG (10000 UNITS) capsule Take 10,000 Units by mouth daily.    [provider]  Vitamin D, Ergocalciferol, (DRISDOL) 1.25 MG (50000 UNIT) CAPS capsule Take 1 capsule (50,000 Units total) by mouth every 7 (seven) days. 02/16/23   Judi Saa, DO   Studies Reviewed:   EKG Interpretation Date/Time:  Friday February 25 2023 11:49:43 EST Ventricular Rate:  70 PR Interval:  176 QRS Duration:  128 QT Interval:  438 QTC Calculation: 473 R Axis:   -80  Text Interpretation: Normal sinus rhythm Possible Left atrial enlargement Right bundle branch block Left anterior fascicular block Bifascicular block  Left ventricular hypertrophy Confirmed by Rise Paganini [phone]) on 02/25/2023 12:25:51 PM    CT cardiac scoring 04/01/2022 1. Coronary calcium score of 0. This was 1st percentile for age, gender, and race matched controls.   2. Aortic atherosclerosis.  Echocardiogram 10/07/2020 1. Left ventricular ejection fraction, by estimation, is 60 to 65%. The  left ventricle has normal function. The left ventricle has no regional  wall motion abnormalities. There is mild concentric left ventricular  hypertrophy. Left ventricular diastolic  parameters are consistent with Grade II diastolic dysfunction  (pseudonormalization). The average left ventricular global longitudinal  strain is -19.4 %. The global longitudinal strain is normal.   2. Right ventricular systolic function is normal. The right ventricular  size is normal. There is  normal pulmonary artery systolic pressure.   3. The mitral valve is normal in structure. Trivial mitral valve  regurgitation. No evidence of mitral stenosis.   4. The aortic valve is normal in structure. Aortic valve regurgitation is  mild to moderate. No aortic stenosis is present.   5. The inferior vena cava is normal in size with greater than 50%  respiratory variability, suggesting right atrial pressure of 3 mmHg.   ZIO 05/04/2019 10-day event monitor showed sinus rhythm with rate ranging from 46-131 bpm. Average heart rate 73 bpm. Rhythm was 100% sinus with intermittent bundle branch block. Very Rare isolated PACs and PVCs noted. No couplets or triplets noted. No bigeminy. No arrhythmias-tachycardia or bradycardia. No pauses  Risk Assessment/Calculations:             Physical Exam:   VS:  BP 112/78 (BP Location: Left Arm, Patient Position: Sitting, Cuff Size: Normal)   Pulse 70   Ht 5\' 3"  (1.6 m)   Wt 167 lb 3.2 oz (75.8 kg)   LMP  (LMP Unknown)   SpO2 98%   BMI 29.62 kg/m    Wt Readings from Last 3 Encounters:  02/25/23 167 lb 3.2 oz (75.8 kg)  02/16/23 165 lb (74.8 kg)  01/31/23 168 lb (76.2 kg)    Constitutional:      Appearance: Normal and healthy appearance. Not in distress.  HENT:     Head: Normocephalic.  Neck:     Vascular: JVD normal.  Pulmonary:     Effort: Pulmonary effort is normal.     Breath sounds: Normal breath sounds.  Chest:     Chest wall: Not tender to palpatation.  Cardiovascular:     PMI at left midclavicular line. Normal rate. Regular rhythm. Normal S1. Normal S2.      Murmurs: There is a grade 2/6 systolic murmur.     No gallop.  No click. No rub.  Pulses:    Intact distal pulses.  Edema:    Peripheral edema absent.  Musculoskeletal: Normal range of motion.     Cervical back: Normal range of motion and neck supple. Skin:    General: Skin is warm and dry.  Neurological:     General: No focal deficit present.     Mental Status: Alert,  oriented to person, place, and time and oriented to person, place and time.  Psychiatric:        Mood and Affect: Mood and affect normal.        Behavior: Behavior is cooperative.        Thought Content: Thought content normal.        Assessment and Plan:  Palpitations / Bifascicular block Patient has history of palpitations ZIO in 04/2019 showed sinus  rhythm with intermediate bundle branch block and rare PACs/PVCs with no significant arrhythmias. -EKG today shows normal sinus rhythm with known bifascicular block unchanged from prior EKG in 02/2022. -Quiescent.  No recent palpitations.  Not on AV nodal blocking agent -Continue to monitor  Aortic insufficiency Most recent echocardiogram in 10/2020 showed normal LV function and mild to moderate aortic insufficiency -Ordered echocardiogram today for routine monitoring  History of syncope Patient has history of recurrent syncope and cardiac workup has been unremarkable. ZIO monitor in 2021 showed no significant arrhythmias.  Syncope was felt to be neurocardiogenic in setting of dehydration.  Last syncopal episode was several years ago in the setting of volume depletion/dehydration. -She denies any recurrent syncope over the past year -Continue to monitor -ED precautions given  Hypertension Blood pressure 112/78 and under excellent control -Continue to monitor blood pressure at home -Continue hydrochlorothiazide 12.5 mg daily and losartan 25 mg daily  Hyperlipidemia LDL 61, TG 45, HDL 54 on 11/03/2022 -Encouraged heart healthy dieting -Continue atorvastatin 20 mg daily  Family history of premature CAD CT cardiac scoring 03/2022 with CAC of 0 and aortic atherosclerosis            Dispo:  Return in about 1 year (around 02/25/2024).  Signed, Denyce Robert, NP

## 2023-02-25 ENCOUNTER — Ambulatory Visit: Payer: Medicare Other | Attending: General Practice | Admitting: Emergency Medicine

## 2023-02-25 ENCOUNTER — Encounter: Payer: Self-pay | Admitting: Emergency Medicine

## 2023-02-25 VITALS — BP 112/78 | HR 70 | Ht 63.0 in | Wt 167.2 lb

## 2023-02-25 DIAGNOSIS — I10 Essential (primary) hypertension: Secondary | ICD-10-CM

## 2023-02-25 DIAGNOSIS — E785 Hyperlipidemia, unspecified: Secondary | ICD-10-CM

## 2023-02-25 DIAGNOSIS — Z87898 Personal history of other specified conditions: Secondary | ICD-10-CM

## 2023-02-25 DIAGNOSIS — R002 Palpitations: Secondary | ICD-10-CM | POA: Diagnosis present

## 2023-02-25 DIAGNOSIS — I351 Nonrheumatic aortic (valve) insufficiency: Secondary | ICD-10-CM

## 2023-02-25 DIAGNOSIS — Z8249 Family history of ischemic heart disease and other diseases of the circulatory system: Secondary | ICD-10-CM

## 2023-02-25 NOTE — Patient Instructions (Addendum)
Medication Instructions:  The current medical regimen is effective;  continue present plan and medications as directed. Please refer to the Current Medication list given to you today.  *If you need a refill on your cardiac medications before your next appointment, please call your pharmacy*  Lab Work: NONE If you have labs (blood work) drawn today and your tests are completely normal, you will receive your results only by:  MyChart Message (if you have MyChart) OR A paper copy in the mail If you have any lab test that is abnormal or we need to change your treatment, we will call you to review the results.  Testing/Procedures: Your physician has requested that you have an echocardiogram. Echocardiography is a painless test that uses sound waves to create images of your heart. It provides your doctor with information about the size and shape of your heart and how well your heart's chambers and valves are working. This procedure takes approximately one hour. There are no restrictions for this procedure. Please do NOT wear cologne, perfume, aftershave, or lotions (deodorant is allowed). Please arrive 15 minutes prior to your appointment time.  Please note: We ask at that you not bring children with you during ultrasound (echo/ vascular) testing. Due to room size and safety concerns, children are not allowed in the ultrasound rooms during exams. Our front office staff cannot provide observation of children in our lobby area while testing is being conducted. An adult accompanying a patient to their appointment will only be allowed in the ultrasound room at the discretion of the ultrasound technician under special circumstances. We apologize for any inconvenience.   Follow-Up: At [facility], you and your health needs are our priority.  As part of our continuing mission to provide you with exceptional heart care, we have created designated Provider Care Teams.  These Care Teams include your  primary Cardiologist (physician) and Advanced Practice Providers (APPs -  Physician Assistants and Nurse Practitioners) who all work together to provide you with the care you need, when you need it.  Your next appointment:   12 month(s)  Provider:   Bryan Lemma, MD  or  Rise Paganini, DNP

## 2023-02-28 ENCOUNTER — Ambulatory Visit
Admission: RE | Admit: 2023-02-28 | Discharge: 2023-02-28 | Disposition: A | Discharge status: Home / Self Care | Payer: Medicare Other | Source: Ambulatory Visit | Attending: Family Medicine | Admitting: Family Medicine

## 2023-02-28 DIAGNOSIS — Z1231 Encounter for screening mammogram for malignant neoplasm of breast: Secondary | ICD-10-CM

## 2023-03-01 NOTE — Progress Notes (Unsigned)
 [name] [address] [place] [address] Phone: [phone] Subjective:   [name], am serving as a scribe for Dr. Antoine Primas.  I'm seeing this patient by the request  of:  Shelva Majestic, MD  CC: left foot pain   [id number]  02/16/2023 Patient has had delayed healing noted on x-ray as well as an ultrasound.  Mild increase in angulation and displacement noted.  Rest icing regimen and home exercises.  We discussed which activities to do and which ones to avoid.  Follow-up with me again 3 weeks.  Put in a postop boot    Update 03/02/2023 Jillian Escobar is a 73 y.o. female coming in with complaint of 4th toe, L foot. Patient states that her pain has improved. Painful to walk without boot over metatarsal heads.       Past Medical History:  Diagnosis Date   Allergy    Aortic valve insufficiency    Arthritis    GERD (gastroesophageal reflux disease)    Hx of varicella    Hyperlipidemia    borderline   Hypertension    Hypothyroidism    Pneumonia 11/2014   Only time took inhalers to treat   RBBB 10/2019   recently seen at [facility] - f/u 6 mons, no treatment   Sacral back pain    Shingles 10/26/2010   Syncope and collapse 10/2019   Unclear etiology - just saw MD - f/u 6 months - no treatment   Thyroid disease    Past Surgical History:  Procedure Laterality Date   CESAREAN SECTION     x1   CHOLECYSTECTOMY     COLONOSCOPY  2011   tics, no polyps - recall 5 yr   ESOPHAGOGASTRODUODENOSCOPY (EGD) WITH PROPOFOL N/A 12/09/2015   Procedure: ESOPHAGOGASTRODUODENOSCOPY (EGD) WITH PROPOFOL;  Surgeon: Ruffin Frederick, MD;  Location: [facility];  Service: Gastroenterology;  Laterality: N/A;   EXPLORATORY LAPAROTOMY     Gallbladder   KIDNEY STONE SURGERY Left    POLYPECTOMY N/A 12/09/2015   Procedure: POLYPECTOMY;  Surgeon: Ruffin Frederick, MD;  Location: [facility];  Service: Gastroenterology;  Laterality: N/A;    TRANSTHORACIC ECHOCARDIOGRAM  10/07/2020   EF 60 to 65%.  Mild concentric LVH.  GRII DD.  Mild to moderate AI.  Normal PAP.  Normal RAP.  Normal RAP.  Stable AI   TUBAL LIGATION     WISDOM TOOTH EXTRACTION     Social History   Socioeconomic History   Marital status: Married    Spouse name: Daphine Deutscher   Number of children: 1   Years of education: Not on file   Highest education level: Associate degree: academic program  Occupational History   Occupation: Retired  Tobacco Use   Smoking status: Former    Current packs/day: 0.00    Types: Cigarettes    Quit date: 01/04/1982    Years since quitting: 41.1   Smokeless tobacco: Never   Tobacco comments:    Marijuana daily  Vaping Use   Vaping status: Former  Substance and Sexual Activity   Alcohol use: Yes    Alcohol/week: 14.0 standard drinks of alcohol    Types: 14 Standard drinks or equivalent per week    Comment: daily- 2 a night    Drug use: Yes    Types: Marijuana    Comment: Last use 10/15/19- Marijuana Daily   Sexual activity: Yes    Birth control/protection: Post-menopausal  Other Topics Concern  Not on file  Social History Narrative   Married, daughter in [place]- no grandkids- has grandpups   - no pets but dog sits   - Originally from [place], moved over 20 years ago      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      Hobbies: traveling, [place], dogsitting   Social Drivers of Corporate investment banker Strain: Low Risk  (11/22/2022)   Overall Financial Resource Strain (CARDIA)    Difficulty of Paying Living Expenses: Not hard at all  Food Insecurity: No Food Insecurity (11/22/2022)   Hunger Vital Sign    Worried About Running Out of Food in the Last Year: Never true    Ran Out of Food in the Last Year: Never true  Transportation Needs: No Transportation Needs (11/22/2022)   PRAPARE - Administrator, Civil Service (Medical): No    Lack of Transportation (Non-Medical): No   Physical Activity: Sufficiently Active (11/22/2022)   Exercise Vital Sign    Days of Exercise per Week: 7 days    Minutes of Exercise per Session: 30 min  Stress: No Stress Concern Present (11/22/2022)   [employer] of Occupational Health - Occupational Stress Questionnaire    Feeling of Stress : Only a little  Social Connections: Socially Integrated (11/22/2022)   Social Connection and Isolation Panel [NHANES]    Frequency of Communication with Friends and Family: More than three times a week    Frequency of Social Gatherings with Friends and Family: Three times a week    Attends Religious Services: 1 to 4 times per year    Active Member of Clubs or Organizations: Yes    Attends Banker Meetings: More than 4 times per year    Marital Status: Married   Allergies  Allergen Reactions   Levaquin [Levofloxacin]     See notes  September 2019  Palpitations irritability sleep issues     Family History  Problem Relation Age of Onset   Heart disease Mother        stent in 11 s   High blood pressure Mother    High Cholesterol Mother    Transient ischemic attack Mother    Thyroid disease Mother    Anxiety disorder Mother    Sleep apnea Mother    COPD Father    Heart disease Father        cabg in 27s    Glaucoma Father    Dementia Father    Ulcerative colitis Sister    Ulcerative colitis Brother    Arrhythmia Daughter    Colon cancer Cousin    Colon polyps Neg Hx    Esophageal cancer Neg Hx    Rectal cancer Neg Hx    Stomach cancer Neg Hx     Current Outpatient Medications (Endocrine & Metabolic):    levothyroxine (SYNTHROID) 50 MCG tablet, TAKE 1 TABLET BY MOUTH EVERY DAY  Current Outpatient Medications (Cardiovascular):    atorvastatin (LIPITOR) 20 MG tablet, TAKE 1 TABLET BY MOUTH EVERY DAY   hydrochlorothiazide (MICROZIDE) 12.5 MG capsule, TAKE 1 CAPSULE BY MOUTH EVERY DAY   losartan (COZAAR) 25 MG tablet, Take 0.5 tablets (12.5 mg total) by mouth  daily.   Current Outpatient Medications (Analgesics):    celecoxib (CELEBREX) 200 MG capsule, Take 1 capsule (200 mg total) by mouth daily.   Current Outpatient Medications (Other):    B Complex Vitamins (B-COMPLEX/B-12) TABS, Change to 1 every  other day   calcium carbonate (OS-CAL) 600 MG TABS tablet,    cholecalciferol (VITAMIN D3) 25 MCG (1000 UNIT) tablet, 1,000 IU every other day   ELDERBERRY PO, daily.    famotidine (PEPCID) 20 MG tablet, Take 20 mg by mouth as needed for heartburn or indigestion.   KRILL OIL PO, Take by mouth in the morning and at bedtime.   Menaquinone-7 (VITAMIN K2 PO), Take by mouth daily.   metaxalone (SKELAXIN) 400 MG tablet, Take 1 tablet (400 mg total) by mouth at bedtime as needed.   Misc Natural Products (OSTEO BI-FLEX/5-LOXIN ADVANCED PO), Take by mouth 2 (two) times daily at 8 am and 10 pm.   Multiple Vitamin (MULTIVITAMIN WITH MINERALS) TABS tablet, Take 1 tablet by mouth daily.    OVER THE COUNTER MEDICATION, Take 1 capsule by mouth daily. CBD oil 2500 mg   tamsulosin (FLOMAX) 0.4 MG CAPS capsule, Take 0.4 mg by mouth daily as needed.   TART CHERRY PO, Take 1 tablet by mouth daily.   Turmeric 500 MG TABS, Take 1 tablet by mouth 2 (two) times daily.   vitamin A 3 MG (10000 UNITS) capsule, Take 10,000 Units by mouth daily.   Vitamin D, Ergocalciferol, (DRISDOL) 1.25 MG (50000 UNIT) CAPS capsule, Take 1 capsule (50,000 Units total) by mouth every 7 (seven) days.   Reviewed prior external information including notes and imaging from  primary care provider As well as notes that were available from care everywhere and other healthcare systems.  Past medical history, social, surgical and family history all reviewed in electronic medical record.  No pertanent information unless stated regarding to the chief complaint.   Review of Systems:  No headache, visual changes, nausea, vomiting, diarrhea, constipation, dizziness, abdominal pain, skin rash, fevers,  chills, night sweats, weight loss, swollen lymph nodes, body aches, joint swelling, chest pain, shortness of breath, mood changes. POSITIVE muscle aches  Objective  Blood pressure 120/84, pulse 75, height 5\' 3"  (1.6 m), SpO2 98%.   General: No apparent distress alert and oriented x3 mood and affect normal, dressed appropriately.  HEENT: Pupils equal, extraocular movements intact  Respiratory: Patient's speak in full sentences and does not appear short of breath  Cardiovascular: No lower extremity edema, non tender, no erythema  Antalgic gait still noted but improving.  Patient is minorly tender to palpation over the fourth metatarsal and phalanx.   Limited muscular skeletal ultrasound was performed and interpreted by Antoine Primas, M  Limited ultrasound shows that patient does have great hard callus formation noted over the dorsal aspect of the previous fracture.  No significant displacement noted at the moment.  No hypoechoic changes in the joint space itself. Impression: Interval improvement    Impression and Recommendations:     The above documentation has been reviewed and is accurate and complete Judi Saa, DO

## 2023-03-02 ENCOUNTER — Ambulatory Visit (INDEPENDENT_AMBULATORY_CARE_PROVIDER_SITE_OTHER): Payer: Medicare Other

## 2023-03-02 ENCOUNTER — Other Ambulatory Visit: Payer: Self-pay

## 2023-03-02 ENCOUNTER — Ambulatory Visit (INDEPENDENT_AMBULATORY_CARE_PROVIDER_SITE_OTHER): Payer: Medicare Other | Admitting: Family Medicine

## 2023-03-02 ENCOUNTER — Encounter: Payer: Self-pay | Admitting: Family Medicine

## 2023-03-02 ENCOUNTER — Ambulatory Visit: Payer: Self-pay

## 2023-03-02 VITALS — BP 120/84 | HR 75 | Ht 63.0 in

## 2023-03-02 DIAGNOSIS — M79672 Pain in left foot: Secondary | ICD-10-CM

## 2023-03-02 DIAGNOSIS — S92502G Displaced unspecified fracture of left lesser toe(s), subsequent encounter for fracture with delayed healing: Secondary | ICD-10-CM | POA: Diagnosis not present

## 2023-03-02 NOTE — Assessment & Plan Note (Signed)
 Patient is healing very well at the moment.  Callus formation noted as well.  Discussed icing regimen and home exercises.  Discussed avoiding certain activities.  Increase activity slowly otherwise.  Follow-up with me again in 2 to 4 weeks and likely can restart other treatment on her back in progressed completely into a shoe.

## 2023-03-02 NOTE — Patient Instructions (Signed)
 Looks good  Can transition into a shoe in 1-2 weeks  Especially around the house Put 20 on black See me in 1 month

## 2023-03-03 ENCOUNTER — Encounter: Payer: Self-pay | Admitting: Family Medicine

## 2023-03-03 ENCOUNTER — Ambulatory Visit: Payer: Medicare Other | Admitting: Family Medicine

## 2023-04-01 NOTE — Progress Notes (Signed)
 Jillian Escobar [address] [place] [address] Phone: [phone] Subjective:   Jillian Escobar, am serving as a scribe for Dr. Antoine Primas.  I'm seeing this patient by the request  of:  Shelva Majestic, MD  CC: Back pain follow-up.  [id number]  03/02/2023 Patient is healing very well at the moment.  Callus formation noted as well.  Discussed icing regimen and home exercises.  Discussed avoiding certain activities.  Increase activity slowly otherwise.  Follow-up with me again in 2 to 4 weeks and likely can restart other treatment on her back in progressed completely into a shoe.     Updated 04/07/2023 Jillian Escobar is a 73 y.o. female coming in with complaint of L toe pain  Low back exam she also is a problem for her.  Has responded well to manipulation previously.  Patient states doing well with broken toe. R foot injection possibly. OMT as well.   Has been following up with weight and wellness but has not had significant progress yet.  Past Medical History:  Diagnosis Date   Allergy    Aortic valve insufficiency    Arthritis    GERD (gastroesophageal reflux disease)    Hx of varicella    Hyperlipidemia    borderline   Hypertension    Hypothyroidism    Pneumonia 11/2014   Only time took inhalers to treat   RBBB 10/2019   recently seen at MD - f/u 6 mons, no treatment   Sacral back pain    Shingles 10/26/2010   Syncope and collapse 10/2019   Unclear etiology - just saw MD - f/u 6 months - no treatment   Thyroid disease    Past Surgical History:  Procedure Laterality Date   CESAREAN SECTION     x1   CHOLECYSTECTOMY     COLONOSCOPY  2011   tics, no polyps - recall 5 yr   ESOPHAGOGASTRODUODENOSCOPY (EGD) WITH PROPOFOL N/A 12/09/2015   Procedure: ESOPHAGOGASTRODUODENOSCOPY (EGD) WITH PROPOFOL;  Surgeon: Ruffin Frederick, MD;  Location: [facility];  Service: Gastroenterology;  Laterality: N/A;   EXPLORATORY LAPAROTOMY      Gallbladder   KIDNEY STONE SURGERY Left    POLYPECTOMY N/A 12/09/2015   Procedure: POLYPECTOMY;  Surgeon: Ruffin Frederick, MD;  Location: [facility];  Service: Gastroenterology;  Laterality: N/A;   TRANSTHORACIC ECHOCARDIOGRAM  10/07/2020   EF 60 to 65%.  Mild concentric LVH.  GRII DD.  Mild to moderate AI.  Normal PAP.  Normal RAP.  Normal RAP.  Stable AI   TUBAL LIGATION     WISDOM TOOTH EXTRACTION     Social History   Socioeconomic History   Marital status: Married    Spouse name: Daphine Deutscher   Number of children: 1   Years of education: Not on file   Highest education level: Associate degree: academic program  Occupational History   Occupation: Retired  Tobacco Use   Smoking status: Former    Current packs/day: 0.00    Types: Cigarettes    Quit date: 01/04/1982    Years since quitting: 41.2   Smokeless tobacco: Never   Tobacco comments:    Marijuana daily  Vaping Use   Vaping status: Former  Substance and Sexual Activity   Alcohol use: Yes    Alcohol/week: 14.0 standard drinks of alcohol    Types: 14 Standard drinks or equivalent per week    Comment: daily- 2 a night    Drug use:  Yes    Types: Marijuana    Comment: Last use 10/15/19- Marijuana Daily   Sexual activity: Yes    Birth control/protection: Post-menopausal  Other Topics Concern   Not on file  Social History Narrative   Married, daughter in [place]- no grandkids- has grandpups   - no pets but dog sits   - Originally from [place], moved over 20 years ago      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      Hobbies: traveling, [employer], dogsitting   Social Drivers of Corporate investment banker Strain: Low Risk  (11/22/2022)   Overall Financial Resource Strain (CARDIA)    Difficulty of Paying Living Expenses: Not hard at all  Food Insecurity: No Food Insecurity (11/22/2022)   Hunger Vital Sign    Worried About Running Out of Food in the Last Year: Never true    Ran Out  of Food in the Last Year: Never true  Transportation Needs: No Transportation Needs (11/22/2022)   PRAPARE - Administrator, Civil Service (Medical): No    Lack of Transportation (Non-Medical): No  Physical Activity: Sufficiently Active (11/22/2022)   Exercise Vital Sign    Days of Exercise per Week: 7 days    Minutes of Exercise per Session: 30 min  Stress: No Stress Concern Present (11/22/2022)   [employer] of Occupational Health - Occupational Stress Questionnaire    Feeling of Stress : Only a little  Social Connections: Socially Integrated (11/22/2022)   Social Connection and Isolation Panel [NHANES]    Frequency of Communication with Friends and Family: More than three times a week    Frequency of Social Gatherings with Friends and Family: Three times a week    Attends Religious Services: 1 to 4 times per year    Active Member of Clubs or Organizations: Yes    Attends Banker Meetings: More than 4 times per year    Marital Status: Married   Allergies  Allergen Reactions   Levaquin [Levofloxacin]     See notes  September 2019  Palpitations irritability sleep issues     Family History  Problem Relation Age of Onset   Heart disease Mother        stent in 70 s   High blood pressure Mother    High Cholesterol Mother    Transient ischemic attack Mother    Thyroid disease Mother    Anxiety disorder Mother    Sleep apnea Mother    COPD Father    Heart disease Father        cabg in 9s    Glaucoma Father    Dementia Father    Ulcerative colitis Sister    Ulcerative colitis Brother    Arrhythmia Daughter    Colon cancer Cousin    Colon polyps Neg Hx    Esophageal cancer Neg Hx    Rectal cancer Neg Hx    Stomach cancer Neg Hx     Current Outpatient Medications (Endocrine & Metabolic):    levothyroxine (SYNTHROID) 50 MCG tablet, TAKE 1 TABLET BY MOUTH EVERY DAY   metFORMIN (GLUCOPHAGE) 500 MG tablet, 1 po with lunch daily  Current  Outpatient Medications (Cardiovascular):    atorvastatin (LIPITOR) 20 MG tablet, TAKE 1 TABLET BY MOUTH EVERY DAY   hydrochlorothiazide (MICROZIDE) 12.5 MG capsule, TAKE 1 CAPSULE BY MOUTH EVERY DAY   losartan (COZAAR) 25 MG tablet, Take 0.5 tablets (12.5 mg total)  by mouth daily.   Current Outpatient Medications (Analgesics):    celecoxib (CELEBREX) 200 MG capsule, Take 1 capsule (200 mg total) by mouth daily.   Current Outpatient Medications (Other):    B Complex Vitamins (B-COMPLEX/B-12) TABS, Change to 1 every other day   calcium carbonate (OS-CAL) 600 MG TABS tablet,    cholecalciferol (VITAMIN D3) 25 MCG (1000 UNIT) tablet, 1,000 IU every other day   ELDERBERRY PO, daily.    famotidine (PEPCID) 20 MG tablet, Take 20 mg by mouth as needed for heartburn or indigestion.   KRILL OIL PO, Take by mouth in the morning and at bedtime.   Menaquinone-7 (VITAMIN K2 PO), Take by mouth daily.   metaxalone (SKELAXIN) 400 MG tablet, Take 1 tablet (400 mg total) by mouth at bedtime as needed.   Misc Natural Products (OSTEO BI-FLEX/5-LOXIN ADVANCED PO), Take by mouth 2 (two) times daily at 8 am and 10 pm.   Multiple Vitamin (MULTIVITAMIN WITH MINERALS) TABS tablet, Take 1 tablet by mouth daily.    OVER THE COUNTER MEDICATION, Take 1 capsule by mouth daily. CBD oil 2500 mg   tamsulosin (FLOMAX) 0.4 MG CAPS capsule, Take 0.4 mg by mouth daily as needed.   TART CHERRY PO, Take 1 tablet by mouth daily.   Turmeric 500 MG TABS, Take 1 tablet by mouth 2 (two) times daily.   vitamin A 3 MG (10000 UNITS) capsule, Take 10,000 Units by mouth daily.   Vitamin D, Ergocalciferol, (DRISDOL) 1.25 MG (50000 UNIT) CAPS capsule, Take 1 capsule (50,000 Units total) by mouth every 7 (seven) days.   Reviewed prior external information including notes and imaging from  primary care provider As well as notes that were available from care everywhere and other healthcare systems.  Past medical history, social, surgical  and family history all reviewed in electronic medical record.  No pertanent information unless stated regarding to the chief complaint.   Review of Systems:  No headache, visual changes, nausea, vomiting, diarrhea, constipation, dizziness, abdominal pain, skin rash, fevers, chills, night sweats, weight loss, swollen lymph nodes, body aches, joint swelling, chest pain, shortness of breath, mood changes. POSITIVE muscle aches  Objective  Blood pressure 110/70, pulse 76, height 5\' 3"  (1.6 m), weight 169 lb (76.7 kg), SpO2 98%.   General: No apparent distress alert and oriented x3 mood and affect normal, dressed appropriately.  HEENT: Pupils equal, extraocular movements intact  Respiratory: Patient's speak in full sentences and does not appear short of breath  Cardiovascular: No lower extremity edema, non tender, no erythema  Right foot does have the rigidity noted of the midfoot.  Left foot nontender at all at this moment.  Patient is severely tender over the midfoot of the right foot.  Negative squeeze test. Low back exam does have some tightness noted.  Some increase in tightness also noted in the thoracolumbar junction than usual.  Tightness in the right parascapular area.   Procedure: Real-time Ultrasound Guided Injection of right midfoot Device: GE Logiq Q7 Ultrasound guided injection is preferred based studies that show increased duration, increased effect, greater accuracy, decreased procedural pain, increased response rate, and decreased cost with ultrasound guided versus blind injection.  Verbal informed consent obtained.  Time-out conducted.  Noted no overlying erythema, induration, or other signs of local infection.  Skin prepped in a sterile fashion.  Local anesthesia: Topical Ethyl chloride.  With sterile technique and under real time ultrasound guidance: With a 25-gauge half inch needle injected with 0.5 cc of  0.5% Marcaine and 0.5 cc of Kenalog 40 mg/mL Completed without  difficulty  Pain immediately resolved suggesting accurate placement of the medication.  Advised to call if fevers/chills, erythema, induration, drainage, or persistent bleeding.  Images saved Impression: Technically successful ultrasound guided injection.  Osteopathic findings C6 flexed rotated and side bent left T4 extended rotated and side bent right inhaled third rib L1 flexed rotated and side bent right L4 flexed rotated and side bent left Sacrum right on right    Impression and Recommendations:    The above documentation has been reviewed and is accurate and complete Judi Saa, DO

## 2023-04-04 ENCOUNTER — Encounter (INDEPENDENT_AMBULATORY_CARE_PROVIDER_SITE_OTHER): Payer: Self-pay | Admitting: Family Medicine

## 2023-04-04 ENCOUNTER — Ambulatory Visit (INDEPENDENT_AMBULATORY_CARE_PROVIDER_SITE_OTHER): Payer: Medicare Other | Admitting: Family Medicine

## 2023-04-04 VITALS — BP 99/62 | HR 65 | Temp 98.0°F | Ht 63.0 in | Wt 165.0 lb

## 2023-04-04 DIAGNOSIS — Z6827 Body mass index (BMI) 27.0-27.9, adult: Secondary | ICD-10-CM

## 2023-04-04 DIAGNOSIS — E88819 Insulin resistance, unspecified: Secondary | ICD-10-CM | POA: Diagnosis not present

## 2023-04-04 DIAGNOSIS — R739 Hyperglycemia, unspecified: Secondary | ICD-10-CM

## 2023-04-04 DIAGNOSIS — E559 Vitamin D deficiency, unspecified: Secondary | ICD-10-CM | POA: Diagnosis not present

## 2023-04-04 DIAGNOSIS — Z6829 Body mass index (BMI) 29.0-29.9, adult: Secondary | ICD-10-CM

## 2023-04-04 DIAGNOSIS — I10 Essential (primary) hypertension: Secondary | ICD-10-CM | POA: Diagnosis not present

## 2023-04-04 DIAGNOSIS — E66811 Obesity, class 1: Secondary | ICD-10-CM

## 2023-04-04 DIAGNOSIS — Z6830 Body mass index (BMI) 30.0-30.9, adult: Secondary | ICD-10-CM

## 2023-04-04 MED ORDER — METFORMIN HCL 500 MG PO TABS: ORAL_TABLET | ORAL | 0 refills | Status: DC

## 2023-04-04 NOTE — Progress Notes (Signed)
 Jillian Escobar, D.O.  ABFM, ABOM Specializing in Clinical Bariatric Medicine  Office located at: [address] [place], [place]  [address]   Assessment and Plan:   Orders Placed This Encounter  Procedures   Vitamin B12   Hemoglobin A1c   Basic metabolic panel with GFR    There are no discontinued medications.   Meds ordered this encounter  Medications   metFORMIN (GLUCOPHAGE) 500 MG tablet    Sig: 1 po with lunch daily    Dispense:  30 tablet    Refill:  0    30 d supply;  ** OV for RF **   Do not send RF request     FOR THE DISEASE OF OBESITY: BMI 27.0-27.9,adult-current BMI  29.24 Obesity, with starting BMI of 30.11 Assessment & Plan: Since last office visit on 01/11/2023 patient's  Muscle mass has increased by 2.2 lb. Fat mass has increased by 3.2 lb. Total body water has increased by 3.2 lb.  Counseling done on how various foods will affect these numbers and how to maximize success  Total lbs lost to date: 5 lbs  Total weight loss percentage to date: 2.94%    Recommended Dietary Goals Jillian Escobar is currently in the action stage of change. As such, her goal is to continue weight management plan.  She has agreed to: continue current plan   Behavioral Intervention We discussed the following today: increasing lean protein intake to established goals, decreasing simple carbohydrates , continue to practice mindfulness when eating, and staying on track while traveling and vacationing  Additional resources provided today: Handout on Food Journaling Log, Handout on risks/ benefits of metformin and associated Myths of use, and Handout on benefits of metformin for weight loss  Evidence-based interventions for health behavior change were utilized today including the discussion of self monitoring techniques, problem-solving barriers and SMART goal setting techniques.   Regarding patient's less desirable eating habits and patterns, we employed the technique of small changes.    Pt will specifically work on: practicing mindful eating with sweets especially when traveling.    Recommended Physical Activity Goals Jillian Escobar has been advised to work up to 150 minutes of moderate intensity aerobic activity a week and strengthening exercises 2-3 times per week for cardiovascular health, weight loss maintenance and preservation of muscle mass.   She has agreed to : continue to gradually increase the amount and intensity of exercise routine   Pharmacotherapy We both agreed to : start Metformin   FOR ASSOCIATED CONDITIONS ADDRESSED TODAY:  Hyperglycemia/ Insulin resistance Assessment & Plan: Treated with diet/lifestyle approach. Reports having persistent sweet cravings at night. After discussion of anticipated benefits and potential side effects patient will be STARTED on Metformin. Explained that the more she eats "off-plan", the more likely for her to have side effects of the drug. She understands to start with 0.5 tablet for 4-6 days and if tolerating well to increase to 1 full tablet. Continue with journaling intake.   Relevant Orders: -     Hemoglobin A1c -     metFORMIN HCl; 1 po with lunch daily  Dispense: 30 tablet; Refill: 0   Essential hypertension Assessment & Plan: Last 3 blood pressure readings in our office are as follows: BP Readings from Last 3 Encounters:  04/04/23 99/62  03/02/23 120/84  02/25/23 112/78   The ASCVD Risk score (Arnett DK, et al., 2019) failed to calculate for the following reasons:   The valid total cholesterol range is 130 to  320 mg/dL  Lab Results  Component Value Date   CREATININE 0.75 11/03/2022   Pt is on a regimen of Cozaar and Microzide. Her blood pressure is low-normal today. Pt asx today. She has occasionally felt dizzy since LOV but it has not been bothersome. Pt instructed to check her BP daily. She understands that if she develops any worsened symptoms of hypotension or if her systolic blood pressure is regularly <  100 to stop the Microzide as first line of treatment.   Relevant Orders:  -     Vitamin B12 -     Basic metabolic panel with GFR   Vitamin D deficiency Assessment & Plan: States she was started on high dose once weekly Vitamin D on 02/25/2023 per Dr.Smith [facility]. States she needs to be on it for a 12 week period. She is also on OTC Vitamin D3 1,000 lU every other day. Continue supplements-  RECHECK Vitamin D next OV.    Follow up:   Return in about 1 month (around 05/04/2023). She was informed of the importance of frequent follow up visits to maximize her success with intensive lifestyle modifications for her multiple health conditions.  Jillian Escobar is aware that we will review all of her lab results at our next visit.  She is aware that if anything is critical/ life threatening with the results, we will be contacting her via MyChart prior to the office visit to discuss management.    Subjective:   Chief complaint: Obesity Jillian Escobar is here to discuss her progress with her obesity treatment plan. She is  on the Category 1 Plan and keeping a food journal and adhering to recommended goals of [phone] calories and 80+ grams of protein and states she is following her eating plan approximately 20% of the time. She states she recently resumed walking at [employer] 45 minutes 5-7 days per week.  Interval History:  Jillian Escobar is here for a follow up office visit. Since last OV on 01/11/2023, Jillian Escobar is up 5 lbs. Recently broke her toe and was sedentary for a few weeks. She is healing well and has been walking more. Returned from a pleasant [place] trip. States she's diligently journaling her intake. Has learned from journaling that she is "eating and drinking too much". She is going on a carnival cruise this Saturday.   Pharmacotherapy for weight loss: She is currently taking no anti-obesity medication.   Review of Systems:  Pertinent positives were addressed with patient  today.  Reviewed by clinician on day of visit: allergies, medications, problem list, medical history, surgical history, family history, social history, and previous encounter notes.  Weight Summary and Biometrics   Weight Lost Since Last Visit: 0  Weight Gained Since Last Visit: 5lb  Vitals Temp: 98 F (36.7 C) BP: 99/62 Pulse Rate: 65 SpO2: 98 %   Anthropometric Measurements Height: 5\' 3"  (1.6 m) Weight: 165 lb (74.8 kg) BMI (Calculated): 29.24 Weight at Last Visit: 160lb Weight Lost Since Last Visit: 0 Weight Gained Since Last Visit: 5lb Starting Weight: 170lb Total Weight Loss (lbs): 5 lb (2.268 kg)   Body Composition  Body Fat %: 39.4 % Fat Mass (lbs): 65.2 lbs Muscle Mass (lbs): 95.2 lbs Total Body Water (lbs): 68.6 lbs Visceral Fat Rating : 11   Other Clinical Data Fasting: no Labs: no Today's Visit #: 11 Starting Date: 03/18/22   Objective:   PHYSICAL EXAM: Blood pressure 99/62, pulse 65, temperature 98 F (36.7 C), height 5'  3" (1.6 m), weight 165 lb (74.8 kg), SpO2 98%. Body mass index is 29.23 kg/m.  General: she is overweight, cooperative and in no acute distress. PSYCH: Has normal mood, affect and thought process.   HEENT: EOMI, sclerae are anicteric. Lungs: Normal breathing effort, no conversational dyspnea. Extremities: Moves * 4 Neurologic: A and O * 3, good insight  DIAGNOSTIC DATA REVIEWED: BMET    Component Value Date/Time   NA 140 11/03/2022 0906   K 4.0 11/03/2022 0906   CL 103 11/03/2022 0906   CO2 23 11/03/2022 0906   GLUCOSE 101 (H) 11/03/2022 0906   GLUCOSE 114 (H) 04/13/2022 0543   BUN 22 11/03/2022 0906   CREATININE 0.75 11/03/2022 0906   CALCIUM 10.2 11/03/2022 0906   GFRNONAA >60 04/13/2022 0543   GFRAA 73 04/04/2019 1234   Lab Results  Component Value Date   HGBA1C 5.3 11/03/2022   HGBA1C 5.3 02/07/2018   Lab Results  Component Value Date   INSULIN 9.9 11/03/2022   INSULIN 14.5 03/18/2022   Lab Results   Component Value Date   TSH 2.750 11/03/2022   CBC    Component Value Date/Time   WBC 5.3 04/13/2022 0543   RBC 4.92 04/13/2022 0543   HGB 15.0 04/13/2022 0543   HGB 14.8 03/18/2022 0847   HCT 43.3 04/13/2022 0543   HCT 43.3 03/18/2022 0847   PLT 333 04/13/2022 0543   PLT 300 03/18/2022 0847   MCV 88.0 04/13/2022 0543   MCV 91 03/18/2022 0847   MCH 30.5 04/13/2022 0543   MCHC 34.6 04/13/2022 0543   RDW 12.9 04/13/2022 0543   RDW 12.6 03/18/2022 0847   Iron Studies No results found for: "IRON", "TIBC", "FERRITIN", "IRONPCTSAT" Lipid Panel     Component Value Date/Time   CHOL 126 11/03/2022 0906   TRIG 45 11/03/2022 0906   HDL 54 11/03/2022 0906   CHOLHDL 2.8 07/14/2021 0837   CHOLHDL 4.1 08/21/2019 0915   VLDL 18.4 07/26/2018 0803   LDLCALC 61 11/03/2022 0906   LDLCALC 133 (H) 08/21/2019 0915   LDLDIRECT 158.4 08/14/2012 0828   Hepatic Function Panel     Component Value Date/Time   PROT 6.8 11/03/2022 0906   ALBUMIN 4.4 11/03/2022 0906   AST 18 11/03/2022 0906   ALT 17 11/03/2022 0906   ALKPHOS 68 11/03/2022 0906   BILITOT 0.8 11/03/2022 0906   BILIDIR 0.25 03/10/2020 0935   IBILI 0.8 08/21/2019 0915      Component Value Date/Time   TSH 2.750 11/03/2022 0906   Nutritional Lab Results  Component Value Date   VD25OH 75.4 11/03/2022   VD25OH 58.7 03/18/2022    Attestations:   I, Jillian Escobar, acting as a Stage manager for [employer], DO., have compiled all relevant documentation for today's office visit on behalf of Jillian Lot, DO, while in the presence of [employer], DO.  Reviewed by clinician on day of visit: allergies, medications, problem list, medical history, surgical history, family history, social history, and previous encounter notes pertinent to patient's obesity diagnosis. I have spent 45 minutes in the care of the patient today including: preparing to see patient (e.g. review and interpretation of tests, old notes ), obtaining  and/or reviewing separately obtained history, performing a medically appropriate examination or evaluation, counseling and educating the patient, ordering medications, test or procedures, documenting clinical information in the electronic or other health care record, and independently interpreting results and communicating results to the patient, family, or caregiver   I have  reviewed the above documentation for accuracy and completeness, and I agree with the above. Jillian Escobar, D.O.  The 21st Century Cures Act was signed into law in 2016 which includes the topic of electronic health records.  This provides immediate access to information in MyChart.  This includes consultation notes, operative notes, office notes, lab results and pathology reports.  If you have any questions about what you read please let us know at your next visit so we can discuss your concerns and take corrective action if need be.  We are right here with you.

## 2023-04-05 LAB — HEMOGLOBIN A1C
Est. average glucose Bld gHb Est-mCnc: 114 mg/dL
Hgb A1c MFr Bld: 5.6 % (ref 4.8–5.6)

## 2023-04-05 LAB — BASIC METABOLIC PANEL WITH GFR
BUN/Creatinine Ratio: 27 (ref 12–28)
BUN: 22 mg/dL (ref 8–27)
CO2: 21 mmol/L (ref 20–29)
Calcium: 10.2 mg/dL (ref 8.7–10.3)
Chloride: 103 mmol/L (ref 96–106)
Creatinine, Ser: 0.82 mg/dL (ref 0.57–1.00)
Glucose: 86 mg/dL (ref 70–99)
Potassium: 4.4 mmol/L (ref 3.5–5.2)
Sodium: 140 mmol/L (ref 134–144)
eGFR: 77 mL/min/{1.73_m2} (ref >59)

## 2023-04-05 LAB — VITAMIN B12: Vitamin B-12: >2000 pg/mL — ABNORMAL HIGH (ref 232–1245)

## 2023-04-06 ENCOUNTER — Ambulatory Visit (HOSPITAL_BASED_OUTPATIENT_CLINIC_OR_DEPARTMENT_OTHER): Payer: Medicare Other

## 2023-04-06 DIAGNOSIS — I351 Nonrheumatic aortic (valve) insufficiency: Secondary | ICD-10-CM

## 2023-04-06 LAB — ECHOCARDIOGRAM COMPLETE
Area-P 1/2: 3.77 cm2
P 1/2 time: 460 ms
S' Lateral: 2.63 cm

## 2023-04-07 ENCOUNTER — Other Ambulatory Visit: Payer: Self-pay

## 2023-04-07 ENCOUNTER — Encounter: Payer: Self-pay | Admitting: Family Medicine

## 2023-04-07 ENCOUNTER — Ambulatory Visit: Payer: Medicare Other | Admitting: Family Medicine

## 2023-04-07 VITALS — BP 110/70 | HR 76 | Ht 63.0 in | Wt 169.0 lb

## 2023-04-07 DIAGNOSIS — M9902 Segmental and somatic dysfunction of thoracic region: Secondary | ICD-10-CM | POA: Diagnosis not present

## 2023-04-07 DIAGNOSIS — M9908 Segmental and somatic dysfunction of rib cage: Secondary | ICD-10-CM

## 2023-04-07 DIAGNOSIS — M51362 Other intervertebral disc degeneration, lumbar region with discogenic back pain and lower extremity pain: Secondary | ICD-10-CM

## 2023-04-07 DIAGNOSIS — S92502G Displaced unspecified fracture of left lesser toe(s), subsequent encounter for fracture with delayed healing: Secondary | ICD-10-CM | POA: Diagnosis not present

## 2023-04-07 DIAGNOSIS — M9901 Segmental and somatic dysfunction of cervical region: Secondary | ICD-10-CM | POA: Diagnosis not present

## 2023-04-07 DIAGNOSIS — M79671 Pain in right foot: Secondary | ICD-10-CM | POA: Diagnosis not present

## 2023-04-07 DIAGNOSIS — M19071 Primary osteoarthritis, right ankle and foot: Secondary | ICD-10-CM | POA: Diagnosis not present

## 2023-04-07 DIAGNOSIS — M9904 Segmental and somatic dysfunction of sacral region: Secondary | ICD-10-CM

## 2023-04-07 DIAGNOSIS — M9903 Segmental and somatic dysfunction of lumbar region: Secondary | ICD-10-CM | POA: Diagnosis not present

## 2023-04-07 NOTE — Assessment & Plan Note (Signed)
 Likely exacerbation secondary to compensating for the contralateral foot from all the arthritic changes in the cam walker.  Patient is doing much better at the time for the left foot but did have an aggravation of this.  Hopefully will calm it down relatively well.  Discussed icing regimen and home exercises.  Discussed which activities to do and which ones to avoid.  Increase activity slowly.  Wear proper shoes.  Follow-up again in 6 to 8 weeks

## 2023-04-07 NOTE — Assessment & Plan Note (Signed)
 Symptomatically healed at this time.

## 2023-04-07 NOTE — Patient Instructions (Addendum)
 Good to see you! Injection in foot today See you again in 6-8 weeks

## 2023-04-07 NOTE — Assessment & Plan Note (Signed)
 Degenerative disc disease of the lumbar spine.  Continue to work on core strength.  Patient will be traveling again.  Has different medications such as Zanaflex for breakthrough pain 4 mg at night.  Increase activity slowly.  Follow-up again in 6 to 8 weeks.

## 2023-04-26 ENCOUNTER — Other Ambulatory Visit (INDEPENDENT_AMBULATORY_CARE_PROVIDER_SITE_OTHER): Payer: Self-pay | Admitting: Family Medicine

## 2023-04-26 ENCOUNTER — Telehealth (INDEPENDENT_AMBULATORY_CARE_PROVIDER_SITE_OTHER): Payer: Self-pay | Admitting: Family Medicine

## 2023-04-26 DIAGNOSIS — E88819 Insulin resistance, unspecified: Secondary | ICD-10-CM

## 2023-04-26 NOTE — Telephone Encounter (Signed)
 Patient called in stating she needs a prior authorization for her Metformin  refill. Please follow up with patient.

## 2023-04-27 NOTE — Telephone Encounter (Signed)
 Called patient LVM to give us  a call back. I will try to call pt again tomorrow.

## 2023-04-28 ENCOUNTER — Other Ambulatory Visit: Payer: Self-pay | Admitting: Cardiology

## 2023-04-28 NOTE — Telephone Encounter (Signed)
Called patient again and LVM to give [place] a call back .

## 2023-05-01 ENCOUNTER — Other Ambulatory Visit (INDEPENDENT_AMBULATORY_CARE_PROVIDER_SITE_OTHER): Payer: Self-pay | Admitting: Family Medicine

## 2023-05-01 DIAGNOSIS — E88819 Insulin resistance, unspecified: Secondary | ICD-10-CM

## 2023-05-02 ENCOUNTER — Other Ambulatory Visit (INDEPENDENT_AMBULATORY_CARE_PROVIDER_SITE_OTHER): Payer: Self-pay

## 2023-05-02 DIAGNOSIS — E88819 Insulin resistance, unspecified: Secondary | ICD-10-CM

## 2023-05-02 MED ORDER — METFORMIN HCL 500 MG PO TABS: ORAL_TABLET | ORAL | 0 refills | Status: DC

## 2023-05-02 NOTE — Telephone Encounter (Signed)
Called pt and LVM to give us a call back

## 2023-05-04 ENCOUNTER — Ambulatory Visit (INDEPENDENT_AMBULATORY_CARE_PROVIDER_SITE_OTHER): Admitting: Family Medicine

## 2023-05-13 NOTE — Progress Notes (Unsigned)
    Jillian [facility]obar D.[facility] [address] [place] [address] Phone: [phone]   Assessment and Plan:     There are no diagnoses linked to this encounter.  ***   Pertinent previous records reviewed include ***    Follow Up: ***     Subjective:   I, Jillian Escobar, am serving as a Neurosurgeon for Doctor Jillian Escobar  Chief Complaint: left  foot pain   HPI:   05/13/23 ***  Relevant Historical Information: ***  Additional pertinent review of systems negative.   Current Outpatient Medications:    atorvastatin  (LIPITOR) 20 MG tablet, TAKE 1 TABLET BY MOUTH EVERY DAY, Disp: 90 tablet, Rfl: 2   B Complex Vitamins (B-COMPLEX/B-12) TABS, Change to 1 every other day, Disp: , Rfl:    calcium  carbonate (OS-CAL) 600 MG TABS tablet, , Disp: , Rfl:    celecoxib  (CELEBREX ) 200 MG capsule, Take 1 capsule (200 mg total) by mouth daily., Disp: 30 capsule, Rfl: 0   cholecalciferol (VITAMIN D3) 25 MCG (1000 UNIT) tablet, 1,000 IU every other day, Disp: , Rfl:    ELDERBERRY PO, daily. , Disp: , Rfl:    famotidine  (PEPCID ) 20 MG tablet, Take 20 mg by mouth as needed for heartburn or indigestion., Disp: , Rfl:    hydrochlorothiazide  (MICROZIDE ) 12.5 MG capsule, TAKE 1 CAPSULE BY MOUTH EVERY DAY, Disp: 90 capsule, Rfl: 3   KRILL OIL PO, Take by mouth in the morning and at bedtime., Disp: , Rfl:    levothyroxine  (SYNTHROID ) 50 MCG tablet, TAKE 1 TABLET BY MOUTH EVERY DAY, Disp: 90 tablet, Rfl: 3   losartan  (COZAAR ) 25 MG tablet, Take 0.5 tablets (12.5 mg total) by mouth daily., Disp: 45 tablet, Rfl: 3   Menaquinone-7 (VITAMIN K2 PO), Take by mouth daily., Disp: , Rfl:    metaxalone  (SKELAXIN ) 400 MG tablet, Take 1 tablet (400 mg total) by mouth at bedtime as needed., Disp: 20 tablet, Rfl: 0   metFORMIN  (GLUCOPHAGE ) 500 MG tablet, 1 po with lunch daily, Disp: 30 tablet, Rfl: 0   Misc Natural Products (OSTEO BI-FLEX/5-LOXIN ADVANCED PO), Take by mouth 2  (two) times daily at 8 am and 10 pm., Disp: , Rfl:    Multiple Vitamin (MULTIVITAMIN WITH MINERALS) TABS tablet, Take 1 tablet by mouth daily. , Disp: , Rfl:    OVER THE COUNTER MEDICATION, Take 1 capsule by mouth daily. CBD oil 2500 mg, Disp: , Rfl:    tamsulosin (FLOMAX) 0.4 MG CAPS capsule, Take 0.4 mg by mouth daily as needed., Disp: , Rfl:    TART CHERRY PO, Take 1 tablet by mouth daily., Disp: , Rfl:    Turmeric 500 MG TABS, Take 1 tablet by mouth 2 (two) times daily., Disp: , Rfl:    vitamin A 3 MG (10000 UNITS) capsule, Take 10,000 Units by mouth daily., Disp: , Rfl:    Vitamin D , Ergocalciferol , (DRISDOL ) 1.25 MG (50000 UNIT) CAPS capsule, Take 1 capsule (50,000 Units total) by mouth every 7 (seven) days., Disp: 12 capsule, Rfl: 0   Objective:     There were no vitals filed for this visit.    There is no height or weight on file to calculate BMI.    Physical Exam:    ***   Electronically signed by:  Marshall Skeeter D.[facility] 8:06 AM 05/13/23

## 2023-05-16 ENCOUNTER — Ambulatory Visit (INDEPENDENT_AMBULATORY_CARE_PROVIDER_SITE_OTHER)

## 2023-05-16 ENCOUNTER — Ambulatory Visit (INDEPENDENT_AMBULATORY_CARE_PROVIDER_SITE_OTHER): Admitting: Sports Medicine

## 2023-05-16 VITALS — BP 124/70 | HR 90 | Ht 63.0 in | Wt 168.0 lb

## 2023-05-16 DIAGNOSIS — M79672 Pain in left foot: Secondary | ICD-10-CM | POA: Diagnosis not present

## 2023-05-16 NOTE — Patient Instructions (Signed)
 Tylenol  and Ibuprofen  as needed.  Follow up as needed.

## 2023-05-17 ENCOUNTER — Ambulatory Visit: Payer: Self-pay | Admitting: Sports Medicine

## 2023-05-23 ENCOUNTER — Ambulatory Visit (INDEPENDENT_AMBULATORY_CARE_PROVIDER_SITE_OTHER): Admitting: Family Medicine

## 2023-05-23 ENCOUNTER — Encounter (INDEPENDENT_AMBULATORY_CARE_PROVIDER_SITE_OTHER): Payer: Self-pay | Admitting: Family Medicine

## 2023-05-23 VITALS — BP 113/60 | HR 63 | Temp 98.0°F | Ht 63.0 in | Wt 164.0 lb

## 2023-05-23 DIAGNOSIS — E559 Vitamin D deficiency, unspecified: Secondary | ICD-10-CM | POA: Diagnosis not present

## 2023-05-23 DIAGNOSIS — I10 Essential (primary) hypertension: Secondary | ICD-10-CM | POA: Diagnosis not present

## 2023-05-23 DIAGNOSIS — Z6827 Body mass index (BMI) 27.0-27.9, adult: Secondary | ICD-10-CM

## 2023-05-23 DIAGNOSIS — Z6829 Body mass index (BMI) 29.0-29.9, adult: Secondary | ICD-10-CM

## 2023-05-23 DIAGNOSIS — E88819 Insulin resistance, unspecified: Secondary | ICD-10-CM | POA: Diagnosis not present

## 2023-05-23 DIAGNOSIS — E669 Obesity, unspecified: Secondary | ICD-10-CM

## 2023-05-23 DIAGNOSIS — Z6830 Body mass index (BMI) 30.0-30.9, adult: Secondary | ICD-10-CM

## 2023-05-23 MED ORDER — METFORMIN HCL 500 MG PO TABS: ORAL_TABLET | ORAL | 1 refills | Status: DC

## 2023-05-23 NOTE — Progress Notes (Signed)
 Jillian Escobar, D.O.  [facility], AB[facility] Specializing in Clinical Bariatric Medicine  Office located at: [address] [place], [place]  [address]   Assessment and Plan:   Medications Discontinued During This Encounter  Medication Reason   metFORMIN  (GLUCOPHAGE ) 500 MG tablet Reorder     Meds ordered this encounter  Medications   metFORMIN  (GLUCOPHAGE ) 500 MG tablet    Sig: 1 po BID with food    Dispense:  60 tablet    Refill:  1    ** OV for RF **   Do not send RF request    Recheck VD and A1c next OV   FOR THE DISEASE OF OBESITY:  BMI 27.0-27.9,adult-current BMI  29.06 Obesity, with starting BMI of 30.11 Assessment & Plan: Since last office visit on 04/04/2023 patient's  Muscle mass has increased by 1.2 lb. Fat mass has decreased by 2.2 lb. Total body water has increased by 2.2 lb.  Counseling done on how various foods will affect these numbers and how to maximize success  Total lbs lost to date: 6 lbs  Total weight loss percentage to date: 3.53%    Recommended Dietary Goals Jillian Escobar is currently in the action stage of change. As such, her goal is to continue weight management plan.  She has agreed to continue current plan   Behavioral Intervention We discussed the following today: increasing lean protein intake to established goals, continue to practice mindfulness when eating, and staying on track while traveling and vacationing  Additional resources provided today: Handout on CAT 1 meal plan  and Handout on CAT 1-2 lunch options  Evidence-based interventions for health behavior change were utilized today including the discussion of self monitoring techniques, problem-solving barriers and SMART goal setting techniques.   Regarding patient's less desirable eating habits and patterns, we employed the technique of small changes.   Pt will specifically work on: n/a   Recommended Physical Activity Goals Jillian Escobar has been advised to work up to 300-450 minutes of moderate  intensity aerobic activity a week and strengthening exercises 2-3 times per week for cardiovascular health, weight loss maintenance and preservation of muscle mass.   She has agreed to continue to gradually increase the amount and intensity of exercise routine   Pharmacotherapy See I.R note.    ASSOCIATED CONDITIONS ADDRESSED TODAY:  Insulin  resistance Assessment & Plan: Most recent labs:  Lab Results  Component Value Date   HGBA1C 5.6 04/04/2023   HGBA1C 5.3 11/03/2022   HGBA1C 5.5 03/18/2022   Her Hemoglobin A1c has worsened. LOV, I started pt on Metformin  500 mg daily; overall tolerating well, occasionally has GI upset when eating "off-plan foods" like ice-cream. Feels the Metformin  has somewhat helped decrease her sugar cravings.   After detailed discussion of benefits and side effects, her Metformin  will be  INCREASED to 500 mg BID. Continue implementation of medically supervised weight management plan. Losing 10% of body weight may improve condition.   Essential hypertension Assessment & Plan: Last 3 blood pressure readings in our office are as follows: BP Readings from Last 3 Encounters:  05/23/23 113/60  05/16/23 124/70  04/07/23 110/70   The ASCVD Risk score (Arnett DK, et al., 2019) failed to calculate for the following reasons:   The valid total cholesterol range is 130 to 320 mg/dL  Lab Results  Component Value Date   CREATININE 0.82 04/04/2023   Lab Results  Component Value Date   VITAMINB12 >2000 (H) 04/04/2023   Blood pressure controlled today  on Hydrochlorothiazide  and Losartan . States her BP is also controlled at home. Reviewed recent renal parameters which are within acceptable ranges. B12 wise, she is taking a daily MVM with some b12 and a B-complex every other day. Pt instructed to continue her daily MVM and D/C her B-complex. Maintain with BP controlling meds and low sodium diet.    Vitamin D  deficiency Assessment & Plan: Most recent VD: Lab  Results  Component Value Date   VD25OH 75.4 11/03/2022   VD25OH 58.7 03/18/2022   States she recently completed her 7 week regimen of once weekly strength vitamin D  per Dr.Smith. Still taking OTC VD 1,000 lU every other day. Continue regimen. Recheck levels next OV.   Follow up:   Return 06/29/2023 at 11:40 AM. She was informed of the importance of frequent follow up visits to maximize her success with intensive lifestyle modifications for her multiple health conditions.  Subjective:   Chief complaint: Obesity Jillian Escobar is here to discuss her progress with her obesity treatment plan. She is  on the Category 1 Plan and keeping a food journal and adhering to recommended goals of [phone] calories and 80+ grams of protein and states she is following her eating plan approximately 80% of the time. She states she is walking at least 10 minutes daily and is doing cardio at the gym 45 minutes 3 days per week   Interval History:  Jillian Escobar is here for a follow up office visit. Since last OV on 04/04/2023, JillianEscobar is down 1 lb. States she does well hitting her journaling goals when she's at home. However, she travels a lot and in those instances is typically low in protein and high in calories. Has another upcoming trip to [facility].   Pharmacotherapy that weight loss: Is currently taking Metformin  500 mg daily.   Review of Systems:  Pertinent positives were addressed with patient today.  Reviewed by clinician on day of visit: allergies, medications, problem list, medical history, surgical history, family history, social history, and previous encounter notes.  Weight Summary and Biometrics   Weight Lost Since Last Visit: 1lb  Weight Gained Since Last Visit: 0   Vitals Temp: 98 F (36.7 C) BP: 113/60 Pulse Rate: 63 SpO2: 98 %   Anthropometric Measurements Height: 5\' 3"  (1.6 m) Weight: 164 lb (74.4 kg) BMI (Calculated): 29.06 Weight at Last Visit: 165lb Weight Lost Since  Last Visit: 1lb Weight Gained Since Last Visit: 0 Starting Weight: 170lb Total Weight Loss (lbs): 6 lb (2.722 kg)   Body Composition  Body Fat %: 38.4 % Fat Mass (lbs): 63 lbs Muscle Mass (lbs): 96 lbs Total Body Water (lbs): 71 lbs Visceral Fat Rating : 11   Other Clinical Data Fasting: no Labs: no Today's Visit #: 12 Starting Date: 03/18/22   Objective:   PHYSICAL EXAM: Blood pressure 113/60, pulse 63, temperature 98 F (36.7 C), height 5\' 3"  (1.6 m), weight 164 lb (74.4 kg), SpO2 98%. Body mass index is 29.05 kg/m.  General: she is overweight, cooperative and in no acute distress. PSYCH: Has normal mood, affect and thought process.   HEENT: EOMI, sclerae are anicteric. Lungs: Normal breathing effort, no conversational dyspnea. Extremities: Moves * 4 Neurologic: A and O * 3, good insight  DIAGNOSTIC DATA REVIEWED: BMET    Component Value Date/Time   NA 140 04/04/2023 1213   K 4.4 04/04/2023 1213   CL 103 04/04/2023 1213   CO2 21 04/04/2023 1213   GLUCOSE 86 04/04/2023 1213  GLUCOSE 114 (H) 04/13/2022 0543   BUN 22 04/04/2023 1213   CREATININE 0.82 04/04/2023 1213   CALCIUM  10.2 04/04/2023 1213   GFRNONAA >60 04/13/2022 0543   GFRAA 73 04/04/2019 1234   Lab Results  Component Value Date   HGBA1C 5.6 04/04/2023   HGBA1C 5.3 02/07/2018   Lab Results  Component Value Date   INSULIN  9.9 11/03/2022   INSULIN  14.5 03/18/2022   Lab Results  Component Value Date   TSH 2.750 11/03/2022   CBC    Component Value Date/Time   WBC 5.3 04/13/2022 0543   RBC 4.92 04/13/2022 0543   HGB 15.0 04/13/2022 0543   HGB 14.8 03/18/2022 0847   HCT 43.3 04/13/2022 0543   HCT 43.3 03/18/2022 0847   PLT 333 04/13/2022 0543   PLT 300 03/18/2022 0847   MCV 88.0 04/13/2022 0543   MCV 91 03/18/2022 0847   MCH 30.5 04/13/2022 0543   MCHC 34.6 04/13/2022 0543   RDW 12.9 04/13/2022 0543   RDW 12.6 03/18/2022 0847   Iron Studies No results found for: "IRON", "TIBC",  "FERRITIN", "IRONPCTSAT" Lipid Panel     Component Value Date/Time   CHOL 126 11/03/2022 0906   TRIG 45 11/03/2022 0906   HDL 54 11/03/2022 0906   CHOLHDL 2.8 07/14/2021 0837   CHOLHDL 4.1 08/21/2019 0915   VLDL 18.4 07/26/2018 0803   LDLCALC 61 11/03/2022 0906   LDLCALC 133 (H) 08/21/2019 0915   LDLDIRECT 158.4 08/14/2012 0828   Hepatic Function Panel     Component Value Date/Time   PROT 6.8 11/03/2022 0906   ALBUMIN 4.4 11/03/2022 0906   AST 18 11/03/2022 0906   ALT 17 11/03/2022 0906   ALKPHOS 68 11/03/2022 0906   BILITOT 0.8 11/03/2022 0906   BILIDIR 0.25 03/10/2020 0935   IBILI 0.8 08/21/2019 0915      Component Value Date/Time   TSH 2.750 11/03/2022 0906   Nutritional Lab Results  Component Value Date   VD25OH 75.4 11/03/2022   VD25OH 58.7 03/18/2022    Attestations:   I, Special Puri, acting as a Stage manager for [employer], DO., have compiled all relevant documentation for today's office visit on behalf of Marceil Sensor, DO, while in the presence of [employer], DO.  I have reviewed the above documentation for accuracy and completeness, and I agree with the above. Jillian Escobar, D.O.  The 21st Century Cures Act was signed into law in 2016 which includes the topic of electronic health records.  This provides immediate access to information in MyChart.  This includes consultation notes, operative notes, office notes, lab results and pathology reports.  If you have any questions about what you read please let us  know at your next visit so we can discuss your concerns and take corrective action if need be.  We are right here with you.

## 2023-05-26 NOTE — Progress Notes (Unsigned)
 [facility] [address] [place] [address] Phone: [phone] Subjective:   Jillian Escobar, am serving as a scribe for Dr. Ronnell Coins.  I'm seeing this patient by the request  of:  Almira Jaeger, MD  CC: Back pain and right foot pain follow-up  [id number]  Jillian Escobar is a 73 y.o. female coming in with complaint of back and neck pain. OMT on 04/07/2023. Also seen for toe pain. Patient states yesterday foot was painful, but today feeling good.       Seen by another provider on May 12 for left foot pain.  Found to have more of a contusion with no fracture.    Reviewed prior external information including notes and imaging from previsou exam, outside providers and external EMR if available.   As well as notes that were available from care everywhere and other healthcare systems.  Past medical history, social, surgical and family history all reviewed in electronic medical record.  No pertanent information unless stated regarding to the chief complaint.   Past Medical History:  Diagnosis Date   Allergy    Aortic valve insufficiency    Arthritis    GERD (gastroesophageal reflux disease)    Hx of varicella    Hyperlipidemia    borderline   Hypertension    Hypothyroidism    Pneumonia 11/2014   Only time took inhalers to treat   RBBB 10/2019   recently seen at [facility] - f/u 6 mons, no treatment   Sacral back pain    Shingles 10/26/2010   Syncope and collapse 10/2019   Unclear etiology - just saw [facility] - f/u 6 months - no treatment   Thyroid  disease     Allergies  Allergen Reactions   Levaquin  [Levofloxacin ]     See notes  September 2019  Palpitations irritability sleep issues       Review of Systems:  No headache, visual changes, nausea, vomiting, diarrhea, constipation, dizziness, abdominal pain, skin rash, fevers, chills, night sweats, weight loss, swollen lymph nodes, body aches, joint swelling, chest pain, shortness of  breath, mood changes. POSITIVE muscle aches  Objective  Blood pressure 126/84, pulse 78, height 5\' 3"  (1.6 m), weight 168 lb (76.2 kg), SpO2 98%.   General: No apparent distress alert and oriented x3 mood and affect normal, dressed appropriately.  HEENT: Pupils equal, extraocular movements intact  Respiratory: Patient's speak in full sentences and does not appear short of breath  Cardiovascular: No lower extremity edema, non tender, no erythema  Gait MSK:  Back increase kyphosis and scoliosis  Tightness in TL   Osteopathic findings  C2 flexed rotated and side bent right C6 flexed rotated and side bent left T3 extended rotated and side bent right inhaled rib T9 extended rotated and side bent left L2 flexed rotated and side bent right Sacrum right on right       Assessment and Plan:  Degenerative disc disease, lumbar Degenerative disc disease.  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  Increase activity slowly.  Discussed icing regimen.  Follow-up again in 6 to 8 weeks otherwise.  Patient will be traveling and likely will do very well.  No pain in the foot at the moment.    Nonallopathic problems  Decision today to treat with OMT was based on Physical Exam  After verbal consent patient was treated with HVLA,  ME, FPR techniques in cervical, rib, thoracic, lumbar, and sacral  areas  Patient tolerated the procedure well with improvement in symptoms  Patient given exercises, stretches and lifestyle modifications  See medications in patient instructions if given  Patient will follow up in 4-8 weeks     The above documentation has been reviewed and is accurate and complete Isidro Margo, DO         Note: This dictation was prepared with Dragon dictation along with smaller phrase technology. Any transcriptional errors that result from this process are  unintentional.

## 2023-05-27 ENCOUNTER — Ambulatory Visit: Payer: Medicare Other | Admitting: Family Medicine

## 2023-05-31 ENCOUNTER — Encounter: Payer: Self-pay | Admitting: Family Medicine

## 2023-05-31 ENCOUNTER — Ambulatory Visit (INDEPENDENT_AMBULATORY_CARE_PROVIDER_SITE_OTHER): Admitting: Family Medicine

## 2023-05-31 VITALS — BP 126/84 | HR 78 | Ht 63.0 in | Wt 168.0 lb

## 2023-05-31 DIAGNOSIS — M9908 Segmental and somatic dysfunction of rib cage: Secondary | ICD-10-CM | POA: Diagnosis not present

## 2023-05-31 DIAGNOSIS — M9904 Segmental and somatic dysfunction of sacral region: Secondary | ICD-10-CM

## 2023-05-31 DIAGNOSIS — M9903 Segmental and somatic dysfunction of lumbar region: Secondary | ICD-10-CM

## 2023-05-31 DIAGNOSIS — M9901 Segmental and somatic dysfunction of cervical region: Secondary | ICD-10-CM | POA: Diagnosis not present

## 2023-05-31 DIAGNOSIS — R252 Cramp and spasm: Secondary | ICD-10-CM

## 2023-05-31 DIAGNOSIS — M9902 Segmental and somatic dysfunction of thoracic region: Secondary | ICD-10-CM

## 2023-05-31 NOTE — Assessment & Plan Note (Addendum)
 Degenerative disc disease.  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  Increase activity slowly.  Discussed icing regimen.  Follow-up again in 6 to 8 weeks otherwise.  Patient will be traveling and likely will do very well.  No pain in the foot at the moment.

## 2023-05-31 NOTE — Patient Instructions (Signed)
 Good to see you! Referral Pulmonology Have a great trip See you again in 6-8 weeks

## 2023-06-03 ENCOUNTER — Telehealth: Payer: Self-pay | Admitting: Cardiology

## 2023-06-03 ENCOUNTER — Ambulatory Visit (INDEPENDENT_AMBULATORY_CARE_PROVIDER_SITE_OTHER): Payer: Medicare Other | Admitting: Family Medicine

## 2023-06-03 ENCOUNTER — Encounter: Payer: Self-pay | Admitting: Family Medicine

## 2023-06-03 VITALS — BP 110/74 | HR 74 | Temp 97.5°F | Ht 63.0 in | Wt 168.4 lb

## 2023-06-03 DIAGNOSIS — I10 Essential (primary) hypertension: Secondary | ICD-10-CM | POA: Diagnosis not present

## 2023-06-03 DIAGNOSIS — E785 Hyperlipidemia, unspecified: Secondary | ICD-10-CM | POA: Diagnosis not present

## 2023-06-03 DIAGNOSIS — E039 Hypothyroidism, unspecified: Secondary | ICD-10-CM | POA: Diagnosis not present

## 2023-06-03 DIAGNOSIS — K760 Fatty (change of) liver, not elsewhere classified: Secondary | ICD-10-CM

## 2023-06-03 MED ORDER — ATORVASTATIN CALCIUM 20 MG PO TABS: 20.0000 mg | ORAL_TABLET | Freq: Every day | ORAL | 3 refills | Status: AC

## 2023-06-03 NOTE — Telephone Encounter (Signed)
 Pt's medication was sent to pt's pharmacy as requested. Confirmation received.

## 2023-06-03 NOTE — Telephone Encounter (Signed)
*  STAT* If patient is at the pharmacy, call can be transferred to refill team.   1. Which medications need to be refilled? (please list name of each medication and dose if known)   atorvastatin  (LIPITOR) 20 MG tablet   2. Would you like to learn more about the convenience, safety, & potential cost savings by using the [facility]?   3. Are you open to using the [facility] (Type [facility]. ).  4. Which pharmacy/location (including street and city if local pharmacy) is medication to be sent to?  [id number] [place], [place] - [address]   5. Do they need a 30 day or 90 day supply?   90 day  Patient stated she still has some medication.

## 2023-06-03 NOTE — Progress Notes (Signed)
 Phone [phone] In person visit   Subjective:   Jillian Escobar is a 73 y.o. year old very pleasant female patient who presents for/with See problem oriented charting Chief Complaint  Patient presents with   Medical Management of Chronic Issues   Hypertension   Hypothyroidism   Past Medical History-  Patient Active Problem List   Diagnosis Date Noted   Hyperlipidemia, unspecified 05/08/2021    Priority: Medium    Aortic valve insufficiency 05/01/2020    Priority: Medium    Symptomatic PVCs 05/14/2019    Priority: Medium    Essential hypertension 01/11/2015    Priority: Medium    Fasting hyperglycemia 03/04/2014    Priority: Medium    Hepatic steatosis mild 11/23/2013    Priority: Medium    BPPV (benign paroxysmal positional vertigo) 11/10/2012    Priority: Medium    Family history of Alzheimer's disease 08/10/2011    Priority: Medium    Hypothyroidism 01/22/2008    Priority: Medium    GERD 01/22/2008    Priority: Medium    Pes anserine bursitis 03/11/2021    Priority: Low   Degenerative disc disease, lumbar 02/11/2021    Priority: Low   Somatic dysfunction of spine, sacral 02/11/2021    Priority: Low   Gastric polyps     Priority: Low   Family history of premature CAD 01/09/2015    Priority: Low   Exertional shortness of breath 12/04/2013    Priority: Low   Perimenopausal vasomotor symptoms 08/10/2011    Priority: Low   Post-nasal drainage 07/30/2011    Priority: Low   Cough, persistent recurrent 07/30/2011    Priority: Low   Postmenopausal HRT (hormone replacement therapy) 06/28/2010    Priority: Low   PLANTAR FASCIITIS, RIGHT 01/02/2008    Priority: Low   Closed fracture of fourth toe of left foot 02/16/2023   AC (acromioclavicular) arthritis 10/27/2022   Insulin  resistance 04/12/2022   Vitamin D  deficiency 04/12/2022   Class 1 obesity with serious comorbidity and body mass index (BMI) of 30.0 to 30.9 in adult 04/12/2022   Arthritis of midfoot  07/23/2021    Medications- reviewed and updated Current Outpatient Medications  Medication Sig Dispense Refill   atorvastatin  (LIPITOR) 20 MG tablet TAKE 1 TABLET BY MOUTH EVERY DAY 90 tablet 2   B Complex Vitamins (B-COMPLEX/B-12) TABS Change to 1 every other day     calcium  carbonate (OS-CAL) 600 MG TABS tablet      celecoxib  (CELEBREX ) 200 MG capsule Take 1 capsule (200 mg total) by mouth daily. 30 capsule 0   cholecalciferol (VITAMIN D3) 25 MCG (1000 UNIT) tablet 1,000 IU every other day     ELDERBERRY PO daily.      famotidine  (PEPCID ) 20 MG tablet Take 20 mg by mouth as needed for heartburn or indigestion.     hydrochlorothiazide  (MICROZIDE ) 12.5 MG capsule TAKE 1 CAPSULE BY MOUTH EVERY DAY 90 capsule 3   KRILL OIL PO Take by mouth in the morning and at bedtime.     levothyroxine  (SYNTHROID ) 50 MCG tablet TAKE 1 TABLET BY MOUTH EVERY DAY 90 tablet 3   losartan  (COZAAR ) 25 MG tablet Take 0.5 tablets (12.5 mg total) by mouth daily. 45 tablet 3   metaxalone  (SKELAXIN ) 400 MG tablet Take 1 tablet (400 mg total) by mouth at bedtime as needed. 20 tablet 0   metFORMIN  (GLUCOPHAGE ) 500 MG tablet 1 po BID with food 60 tablet 1   Misc Natural Products (OSTEO BI-FLEX/5-LOXIN ADVANCED PO)  Take by mouth 2 (two) times daily at 8 am and 10 pm.     Multiple Vitamin (MULTIVITAMIN WITH MINERALS) TABS tablet Take 1 tablet by mouth daily.      tamsulosin (FLOMAX) 0.4 MG CAPS capsule Take 0.4 mg by mouth daily as needed.     TART CHERRY PO Take 1 tablet by mouth daily.     Turmeric 500 MG TABS Take 1 tablet by mouth 2 (two) times daily.     vitamin A 3 MG (10000 UNITS) capsule Take 10,000 Units by mouth daily.     No current facility-administered medications for this visit.     Objective:  BP 110/74   Pulse 74   Temp (!) 97.5 F (36.4 C)   Ht 5\' 3"  (1.6 m)   Wt 168 lb 6.4 oz (76.4 kg)   LMP  (LMP Unknown)   SpO2 98%   BMI 29.83 kg/m  Gen: NAD, resting comfortably CV: RRR faint systolic  murmur- known aortic valve sclerosis- do not tihnk it is her regurgitation which is only mild and would be diastolic Lungs: CTAB no crackles, wheeze, rhonchi Ext: no edema Skin: warm, dry     Assessment and Plan   #social update- really fun river cruise over Christmas -also did cruise with carnival with husband  #Leg cramping and stiffness each am- referred for snoring evaluation by pulmonary- may refer to [facility] if cannot get in with [place] office for pulmonary   #healthy weight to wellness- working on weight loss- down about 3% from peak. Not sure if going to continue or not. As low as 164 on home scales yesterday  #hypertension and PVCs-sees Dr. Addie Holstein S: medication: Hydrochlorothiazide  12.5 mg, losartan  12.5 mg (started half tablet 11/09/2021) -PVCs typically controlled without beta-blockers-reduced triggers such as caffeine and sweets and staying well-hydrated BP Readings from Last 3 Encounters:  06/03/23 110/74  05/31/23 126/84  05/23/23 113/60  A/P: well controlled continue current medications    #hyperlipidemia- family history CAD S: Medication: Atorvastatin  20 mg daily, Krill oil  Lab Results  Component Value Date   CHOL 126 11/03/2022   HDL 54 11/03/2022   LDLCALC 61 11/03/2022   LDLDIRECT 158.4 08/14/2012   TRIG 45 11/03/2022   CHOLHDL 2.8 07/14/2021  A/P: well controlled continue current medications   #hypothyroidism S: compliant On thyroid  medication-levothyroxine  50 mcg -Reassuring thyroid  ultrasound 03/03/21-3 mm nodule with no follow-up recommended  Lab Results  Component Value Date   TSH 2.750 11/03/2022  A/P:stable- continue current medicines     # Hyperglycemia/insulin  resistance/prediabetes-fasting blood sugars elevated in the past but A1c's have been okay S:  Medication: metformin  500 mg twicedaily started by healthy weight to wellness program. Not sure if making a difference -walking post meals oftentimes Lab Results  Component Value Date    HGBA1C 5.6 04/04/2023   HGBA1C 5.3 11/03/2022   HGBA1C 5.5 03/18/2022  A/P: a1c looked great on recent check with healthy weight to wellness program- continue current medications for now but she is not sure on staying on metformin    # GERD S:Medication: pepcid  once or twice daily in past- doing better with weight loss  -Nexium  20 mg in past A/P: doing well- continue to monitor with very sparing medicine- weight loss was helpful    #Fatty liver-LFTs looked good last visit- continue to monitor - check next visit. Plus working on weight loss- wants to get down another 5 lbs ideally- great goal  Lab Results  Component Value Date  ALT 17 11/03/2022   AST 18 11/03/2022   ALKPHOS 68 11/03/2022   BILITOT 0.8 11/03/2022   Recommended follow up: Return in about 6 months (around 12/04/2023) for followup or sooner if needed.Schedule b4 you leave. Future Appointments  Date Time Provider Department Center  06/29/2023 11:40 AM Marceil Sensor, DO [facility] None  07/19/2023 11:15 AM Isidro Margo, DO [facility] None  07/25/2023  8:15 AM [facility] ANNUAL WELLNESS VISIT 1 [facility]  08/02/2023 10:40 AM Opalski, Bernardo Bridgeman, DO [facility] None    Lab/Order associations:   ICD-10-CM   1. Essential hypertension  I10     2. Hyperlipidemia, unspecified hyperlipidemia type  E78.5     3. Hepatic steatosis mild  K76.0     4. Hypothyroidism, unspecified type  E03.9       No orders of the defined types were placed in this encounter.   Return precautions advised.  Clarisa Crooked, MD

## 2023-06-03 NOTE — Patient Instructions (Addendum)
 Please stop by lab before you go If you have mychart- we will send your results within 3 business days of us  receiving them.  If you do not have mychart- we will call you about results within 5 business days of us  receiving them.  *please also note that you will see labs on mychart as soon as they post. I will later go in and write notes on them- will say "notes from Dr. Arlene Ben"   I like your 5 lbs weight loss goal so 163 on our scales by follow up   Recommended follow up: Return in about 6 months (around 12/04/2023) for followup or sooner if needed.Schedule b4 you leave. Labs next visit

## 2023-06-14 ENCOUNTER — Other Ambulatory Visit (INDEPENDENT_AMBULATORY_CARE_PROVIDER_SITE_OTHER): Payer: Self-pay | Admitting: Family Medicine

## 2023-06-14 DIAGNOSIS — E88819 Insulin resistance, unspecified: Secondary | ICD-10-CM

## 2023-06-29 ENCOUNTER — Ambulatory Visit (INDEPENDENT_AMBULATORY_CARE_PROVIDER_SITE_OTHER): Admitting: Family Medicine

## 2023-06-29 ENCOUNTER — Other Ambulatory Visit (HOSPITAL_BASED_OUTPATIENT_CLINIC_OR_DEPARTMENT_OTHER): Payer: Self-pay

## 2023-06-29 MED ORDER — CAPVAXIVE 0.5 ML IM SOSY
0.5000 mL | PREFILLED_SYRINGE | Freq: Once | INTRAMUSCULAR | 0 refills | Status: AC
  Filled 2023-06-29: qty 0.5, 1d supply, fill #0

## 2023-07-07 NOTE — Progress Notes (Signed)
 Jillian Escobar, am serving as a scribe for Dr. Arthea Sharps.  [facility] [address] [place] [address] Phone: [phone] Subjective:   Jillian Escobar, am serving as a scribe for Dr. Arthea Sharps.  I'm seeing this patient by the request  of:  Jillian Garnette KIDD, MD  CC: Left leg pain  YEP:Dlagzrupcz  Jillian Escobar is a 73 y.o. female coming in with complaint of back and neck pain. OMT 05/31/2023. Patient states that her neck has been doing good.   C/o numbness going down lateral aspect of L leg into the back of her calf. Also notes a pressure sensation in achilles tendon. Walks daily for 30 minutes and throughout entire walk she feels symptoms. Pain also worse when she first gets up in morning and at night when she is lying on the L hip.   Medications patient has been prescribed: None  Taking:         Reviewed prior external information including notes and imaging from previsou exam, outside providers and external EMR if available.   As well as notes that were available from care everywhere and other healthcare systems.  Past medical history, social, surgical and family history all reviewed in electronic medical record.  No pertanent information unless stated regarding to the chief complaint.   Past Medical History:  Diagnosis Date   Allergy    Aortic valve insufficiency    Arthritis    GERD (gastroesophageal reflux disease)    Hx of varicella    Hyperlipidemia    borderline   Hypertension    Hypothyroidism    Pneumonia 11/2014   Only time took inhalers to treat   RBBB 10/2019   recently seen at [facility] - f/u 6 mons, no treatment   Sacral back pain    Shingles 10/26/2010   Syncope and collapse 10/2019   Unclear etiology - just saw [facility] - f/u 6 months - no treatment   Thyroid  disease     Allergies  Allergen Reactions   Levaquin  [Levofloxacin ]     See notes  September 2019  Palpitations irritability sleep issues        Review of Systems:  No headache, visual changes, nausea, vomiting, diarrhea, constipation, dizziness, abdominal pain, skin rash, fevers, chills, night sweats, weight loss, swollen lymph nodes, body aches, joint swelling, chest pain, shortness of breath, mood changes. POSITIVE muscle aches  Objective  Blood pressure 110/74, pulse 61, height 5' 3 (1.6 m), weight 165 lb (74.8 kg), SpO2 98%.   General: No apparent distress alert and oriented x3 mood and affect normal, dressed appropriately.  HEENT: Pupils equal, extraocular movements intact  Respiratory: Patient's speak in full sentences and does not appear short of breath  Cardiovascular: No lower extremity edema, non tender, no erythema  Gait MSK:  Back low back does have tightness noted in the paraspinal musculature.  Patient does have some tenderness noted with straight leg test left greater than right.  Osteopathic findings  C2 flexed rotated and side bent right C6 flexed rotated and side bent left T3 extended rotated and side bent right inhaled rib T9 extended rotated and side bent left L2 flexed rotated and side bent left L4 flexed rotated and side bent left Sacrum right on right       Assessment and Plan:  Left lumbar radiculopathy Positive straight leg test noted today.  Having some radicular symptoms.  No significant weakness noted.  Has responded to manipulation previously  and we discussed the potential for this.  Patient states that he would like to try it.  Discussed icing regimen and home exercises, discussed which activities to do and which ones to avoid.  Patient given gabapentin  200 mg at night for the radicular symptoms.  Follow-up again in 6 to 8 weeks worsening pain advanced imaging would be warranted.  X-rays ordered today    Nonallopathic problems  Decision today to treat with OMT was based on Physical Exam  After verbal consent patient was treated with HVLA, ME, FPR techniques in cervical, rib,  thoracic, lumbar, and sacral  areas  Patient tolerated the procedure well with improvement in symptoms  Patient given exercises, stretches and lifestyle modifications  See medications in patient instructions if given  Patient will follow up in 4-8 weeks             Note: This dictation was prepared with Dragon dictation along with smaller phrase technology. Any transcriptional errors that result from this process are unintentional.

## 2023-07-19 ENCOUNTER — Encounter: Payer: Self-pay | Admitting: Family Medicine

## 2023-07-19 ENCOUNTER — Ambulatory Visit (INDEPENDENT_AMBULATORY_CARE_PROVIDER_SITE_OTHER)

## 2023-07-19 ENCOUNTER — Ambulatory Visit (INDEPENDENT_AMBULATORY_CARE_PROVIDER_SITE_OTHER): Admitting: Family Medicine

## 2023-07-19 VITALS — BP 110/74 | HR 61 | Ht 63.0 in | Wt 165.0 lb

## 2023-07-19 DIAGNOSIS — M545 Low back pain, unspecified: Secondary | ICD-10-CM | POA: Diagnosis not present

## 2023-07-19 DIAGNOSIS — M9908 Segmental and somatic dysfunction of rib cage: Secondary | ICD-10-CM

## 2023-07-19 DIAGNOSIS — M5416 Radiculopathy, lumbar region: Secondary | ICD-10-CM | POA: Diagnosis not present

## 2023-07-19 DIAGNOSIS — M9903 Segmental and somatic dysfunction of lumbar region: Secondary | ICD-10-CM | POA: Diagnosis not present

## 2023-07-19 DIAGNOSIS — M9902 Segmental and somatic dysfunction of thoracic region: Secondary | ICD-10-CM | POA: Diagnosis not present

## 2023-07-19 DIAGNOSIS — M9901 Segmental and somatic dysfunction of cervical region: Secondary | ICD-10-CM | POA: Diagnosis not present

## 2023-07-19 DIAGNOSIS — M9904 Segmental and somatic dysfunction of sacral region: Secondary | ICD-10-CM | POA: Diagnosis not present

## 2023-07-19 MED ORDER — GABAPENTIN 100 MG PO CAPS: 200.0000 mg | ORAL_CAPSULE | Freq: Every day | ORAL | 0 refills | Status: DC

## 2023-07-19 NOTE — Patient Instructions (Addendum)
 Gabapentin  200mg  can start with 100mg  if you want Xray today See you again in 2 months

## 2023-07-19 NOTE — Assessment & Plan Note (Signed)
 Positive straight leg test noted today.  Having some radicular symptoms.  No significant weakness noted.  Has responded to manipulation previously and we discussed the potential for this.  Patient states that he would like to try it.  Discussed icing regimen and home exercises, discussed which activities to do and which ones to avoid.  Patient given gabapentin  200 mg at night for the radicular symptoms.  Follow-up again in 6 to 8 weeks worsening pain advanced imaging would be warranted.  X-rays ordered today

## 2023-07-21 ENCOUNTER — Ambulatory Visit: Payer: Self-pay | Admitting: Family Medicine

## 2023-07-21 ENCOUNTER — Telehealth: Payer: Self-pay | Admitting: Cardiology

## 2023-07-21 ENCOUNTER — Ambulatory Visit: Admitting: Family Medicine

## 2023-07-21 ENCOUNTER — Encounter: Payer: Self-pay | Admitting: Family Medicine

## 2023-07-21 ENCOUNTER — Ambulatory Visit: Payer: Self-pay | Admitting: *Deleted

## 2023-07-21 ENCOUNTER — Ambulatory Visit: Attending: Family Medicine

## 2023-07-21 VITALS — BP 130/70 | HR 75 | Temp 97.5°F | Ht 63.0 in | Wt 163.8 lb

## 2023-07-21 DIAGNOSIS — R0602 Shortness of breath: Secondary | ICD-10-CM

## 2023-07-21 DIAGNOSIS — R002 Palpitations: Secondary | ICD-10-CM

## 2023-07-21 DIAGNOSIS — R42 Dizziness and giddiness: Secondary | ICD-10-CM

## 2023-07-21 DIAGNOSIS — E039 Hypothyroidism, unspecified: Secondary | ICD-10-CM | POA: Diagnosis not present

## 2023-07-21 DIAGNOSIS — I10 Essential (primary) hypertension: Secondary | ICD-10-CM | POA: Diagnosis not present

## 2023-07-21 LAB — CBC WITH DIFFERENTIAL/PLATELET
Basophils Absolute: 0 K/uL (ref 0.0–0.1)
Basophils Relative: 1.1 % (ref 0.0–3.0)
Eosinophils Absolute: 0.1 K/uL (ref 0.0–0.7)
Eosinophils Relative: 3 % (ref 0.0–5.0)
HCT: 42.9 % (ref 36.0–46.0)
Hemoglobin: 14.4 g/dL (ref 12.0–15.0)
Lymphocytes Relative: 42.8 % (ref 12.0–46.0)
Lymphs Abs: 1.7 K/uL (ref 0.7–4.0)
MCHC: 33.5 g/dL (ref 30.0–36.0)
MCV: 90.8 fl (ref 78.0–100.0)
Monocytes Absolute: 0.5 K/uL (ref 0.1–1.0)
Monocytes Relative: 12.1 % — ABNORMAL HIGH (ref 3.0–12.0)
Neutro Abs: 1.6 K/uL (ref 1.4–7.7)
Neutrophils Relative %: 41 % — ABNORMAL LOW (ref 43.0–77.0)
Platelets: 289 K/uL (ref 150.0–400.0)
RBC: 4.73 Mil/uL (ref 3.87–5.11)
RDW: 13 % (ref 11.5–15.5)
WBC: 3.9 K/uL — ABNORMAL LOW (ref 4.0–10.5)

## 2023-07-21 LAB — COMPREHENSIVE METABOLIC PANEL WITH GFR
ALT: 16 U/L (ref 0–35)
AST: 18 U/L (ref 0–37)
Albumin: 4.3 g/dL (ref 3.5–5.2)
Alkaline Phosphatase: 56 U/L (ref 39–117)
BUN: 22 mg/dL (ref 6–23)
CO2: 27 meq/L (ref 19–32)
Calcium: 9.6 mg/dL (ref 8.4–10.5)
Chloride: 105 meq/L (ref 96–112)
Creatinine, Ser: 0.81 mg/dL (ref 0.40–1.20)
GFR: 73.34 mL/min (ref >60.00)
Glucose, Bld: 92 mg/dL (ref 70–99)
Potassium: 3.4 meq/L — ABNORMAL LOW (ref 3.5–5.1)
Sodium: 140 meq/L (ref 135–145)
Total Bilirubin: 1.1 mg/dL (ref 0.2–1.2)
Total Protein: 6.9 g/dL (ref 6.0–8.3)

## 2023-07-21 LAB — TSH: TSH: 2.25 u[IU]/mL (ref 0.35–5.50)

## 2023-07-21 NOTE — Telephone Encounter (Signed)
Pt was seen in office this morning

## 2023-07-21 NOTE — Telephone Encounter (Signed)
 Patient c/o Palpitations: STAT if patient c/o lightheadedness, shortness of breath, or chest pain  How long have you had palpitations/irregular HR/ Afib? Are you having the symptoms now?  Palpitations began a few days ago. Becoming progressively worse. Started off sporadic, but now she feels it all the time   Are you currently experiencing lightheadedness, SOB or CP?  Currently lightheaded  Do you have a history of afib (atrial fibrillation) or irregular heart rhythm?  Hx HR irregular  Have you checked your BP or HR? (document readings if available):  7/16: 110/74 125 7/17: 85 bpm currently  Are you experiencing any other symptoms?  Lightheadedness, sweating

## 2023-07-21 NOTE — Patient Instructions (Addendum)
 Please stop by lab before you go If you have mychart- we will send your results within 3 business days of us  receiving them.  If you do not have mychart- we will call you about results within 5 business days of us  receiving them.  *please also note that you will see labs on mychart as soon as they post. I will later go in and write notes on them- will say notes from Dr. Katrinka   They should send you the Zio patch to wear to evaluate for palpitations  If recurrent severe episode particularly if shortness of breath or chest pain seek care immediately  Also could try to get on the books with Dr. Anner for his impression  Lets short term hold the hydrochlorothiazide  as long as blood pressure still <135/85  Recommended follow up: Return for as needed for new, worsening, persistent symptoms.

## 2023-07-21 NOTE — Telephone Encounter (Signed)
 Spoke to patient-   states she has been having palpitation more frequently  over the pa several weeks. Last night was worse. She states she had palp. All night, she was able to sleep. She states her fit bit had her heart as high as 127.  Rate now is in the 80's . She states she feels  light headed  and dizzy occasionally .    RN informed patient no available appointment today or tomorrow. Patient states she will be  out of town next week MON- Thurs.  RN advised to contact her primary to see if they have an availability today or tomorrow.  If not ,she will need to go to Urgent  care or ER.  She verbalized understanding.

## 2023-07-21 NOTE — Progress Notes (Unsigned)
 EP to read.

## 2023-07-21 NOTE — Telephone Encounter (Signed)
 FYI Only or Action Required?: Action required by provider: request for appointment.  Patient was last seen in primary care on 06/03/2023 by Katrinka Garnette KIDD, MD.  Called Nurse Triage reporting Palpitations.  Symptoms began several days ago.  Interventions attempted: Rest, hydration, or home remedies.  Symptoms are: gradually worsening.  Triage Disposition: See HCP Within 4 Hours (Or PCP Triage)  Patient/caregiver understands and will follow disposition?: Yes              Copied from CRM [id number]. Topic: Clinical - Red Word Triage >> Jul 21, 2023  8:27 AM Jillian Escobar wrote: Red Word that prompted transfer to Nurse Triage: Patient is experiencing heart palpitation, dizziness and sweats for the last couple of days. Answer Assessment - Initial Assessment Questions 1. DESCRIPTION: Please describe your heart rate or heartbeat that you are having (e.g., fast/slow, regular/irregular, skipped or extra beats, palpitations)     Palpitations  2. ONSET: When did it start? (e.g., minutes, hours, days)      Dizziness 1 week ago , heart elevated heart rate. 3. DURATION: How long does it last (e.g., seconds, minutes, hours)     constant 4. PATTERN Does it come and go, or has it been constant since it started?  Does it get worse with exertion?   Are you feeling it now?     Constant now  5. TAP: Using your hand, can you tap out what you are feeling on a chair or table in front of you, so that I can hear? Note: Not all patients can do this.       na 6. HEART RATE: Can you tell me your heart rate? How many beats in 15 seconds?  Note: Not all patients can do this.       80 at rest but reports fluctuations 7. RECURRENT SYMPTOM: Have you ever had this before? If Yes, ask: When was the last time? and What happened that time?      Yes  8. CAUSE: What do you think is causing the palpitations?     Not sure  9. CARDIAC HISTORY: Do you have any history of heart disease?  (e.g., heart attack, angina, bypass surgery, angioplasty, arrhythmia)      Hx yes  10. OTHER SYMPTOMS: Do you have any other symptoms? (e.g., dizziness, chest pain, sweating, difficulty breathing)       Heart racing now dizziness now standing , SOB with exertion  11. PREGNANCY: Is there any chance you are pregnant? When was your last menstrual period?       na  Protocols used: Heart Rate and Heartbeat Questions-A-AH  Answer Assessment - Initial Assessment Questions Appt scheduled today . Recommended if sx worsen go to ED or call 911    1. DESCRIPTION: Please describe your heart rate or heartbeat that you are having (e.g., fast/slow, regular/irregular, skipped or extra beats, palpitations)     Palpitations fast heart rate esp at night 2. ONSET: When did it start? (e.g., minutes, hours, days)      Dizziness 1 week ago , heart elevated heart rate. 3. DURATION: How long does it last (e.g., seconds, minutes, hours)     constant 4. PATTERN Does it come and go, or has it been constant since it started?  Does it get worse with exertion?   Are you feeling it now?     Constant now  5. TAP: Using your hand, can you tap out what you are feeling on a chair or table in  front of you, so that I can hear? Note: Not all patients can do this.       na 6. HEART RATE: Can you tell me your heart rate? How many beats in 15 seconds?  Note: Not all patients can do this.       80 at rest but reports fluctuations 7. RECURRENT SYMPTOM: Have you ever had this before? If Yes, ask: When was the last time? and What happened that time?      Yes  8. CAUSE: What do you think is causing the palpitations?     Not sure  9. CARDIAC HISTORY: Do you have any history of heart disease? (e.g., heart attack, angina, bypass surgery, angioplasty, arrhythmia)      Hx yes  10. OTHER SYMPTOMS: Do you have any other symptoms? (e.g., dizziness, chest pain, sweating, difficulty breathing)        Heart racing  at times , 80 bpm now ,dizziness with standing , SOB with exertion sweating .  11. PREGNANCY: Is there any chance you are pregnant? When was your last menstrual period?       na  Protocols used: Heart Rate and Heartbeat Questions-A-AH  Reason for Disposition  [1] Difficulty breathing with exertion (e.g., walking) and [2] NEW or getting WORSE  Answer Assessment - Initial Assessment Questions Appt scheduled today . Recommended if sx worsen go to ED or call 911    1. DESCRIPTION: Please describe your heart rate or heartbeat that you are having (e.g., fast/slow, regular/irregular, skipped or extra beats, palpitations)     Palpitations fast heart rate esp at night 2. ONSET: When did it start? (e.g., minutes, hours, days)      Dizziness 1 week ago , heart elevated heart rate. 3. DURATION: How long does it last (e.g., seconds, minutes, hours)     constant 4. PATTERN Does it come and go, or has it been constant since it started?  Does it get worse with exertion?   Are you feeling it now?     Constant now  5. TAP: Using your hand, can you tap out what you are feeling on a chair or table in front of you, so that I can hear? Note: Not all patients can do this.       na 6. HEART RATE: Can you tell me your heart rate? How many beats in 15 seconds?  Note: Not all patients can do this.       80 at rest but reports fluctuations 7. RECURRENT SYMPTOM: Have you ever had this before? If Yes, ask: When was the last time? and What happened that time?      Yes  8. CAUSE: What do you think is causing the palpitations?     Not sure  9. CARDIAC HISTORY: Do you have any history of heart disease? (e.g., heart attack, angina, bypass surgery, angioplasty, arrhythmia)      Hx yes  10. OTHER SYMPTOMS: Do you have any other symptoms? (e.g., dizziness, chest pain, sweating, difficulty breathing)       Heart racing  at times , 80 bpm now ,dizziness with standing , SOB  with exertion sweating .  11. PREGNANCY: Is there any chance you are pregnant? When was your last menstrual period?       na  Protocols used: Heart Rate and Heartbeat Questions-A-AH

## 2023-07-21 NOTE — Progress Notes (Signed)
 Phone [phone] In person visit   Subjective:   Jillian Escobar is a 73 y.o. year old very pleasant female patient who presents for/with See problem oriented charting Chief Complaint  Patient presents with   Shortness of Breath    Pt c/o SOB and heart palpitations along with light headedness that is getting worse. Denies chest pain.    Past Medical History-  Patient Active Problem List   Diagnosis Date Noted   Hyperlipidemia, unspecified 05/08/2021    Priority: Medium    Aortic valve insufficiency 05/01/2020    Priority: Medium    Symptomatic PVCs 05/14/2019    Priority: Medium    Essential hypertension 01/11/2015    Priority: Medium    Fasting hyperglycemia 03/04/2014    Priority: Medium    Hepatic steatosis mild 11/23/2013    Priority: Medium    BPPV (benign paroxysmal positional vertigo) 11/10/2012    Priority: Medium    Family history of Alzheimer's disease 08/10/2011    Priority: Medium    Hypothyroidism 01/22/2008    Priority: Medium    GERD 01/22/2008    Priority: Medium    Pes anserine bursitis 03/11/2021    Priority: Low   Degenerative disc disease, lumbar 02/11/2021    Priority: Low   Somatic dysfunction of spine, sacral 02/11/2021    Priority: Low   Gastric polyps     Priority: Low   Family history of premature CAD 01/09/2015    Priority: Low   Exertional shortness of breath 12/04/2013    Priority: Low   Perimenopausal vasomotor symptoms 08/10/2011    Priority: Low   Post-nasal drainage 07/30/2011    Priority: Low   Cough, persistent recurrent 07/30/2011    Priority: Low   Postmenopausal HRT (hormone replacement therapy) 06/28/2010    Priority: Low   PLANTAR FASCIITIS, RIGHT 01/02/2008    Priority: Low   Left lumbar radiculopathy 07/19/2023   Closed fracture of fourth toe of left foot 02/16/2023   AC (acromioclavicular) arthritis 10/27/2022   Insulin  resistance 04/12/2022   Vitamin D  deficiency 04/12/2022   Class 1 obesity with serious  comorbidity and body mass index (BMI) of 30.0 to 30.9 in adult 04/12/2022   Arthritis of midfoot 07/23/2021    Medications- reviewed and updated Current Outpatient Medications  Medication Sig Dispense Refill   atorvastatin  (LIPITOR) 20 MG tablet Take 1 tablet (20 mg total) by mouth daily. 90 tablet 3   calcium  carbonate (OS-CAL) 600 MG TABS tablet      celecoxib  (CELEBREX ) 200 MG capsule Take 1 capsule (200 mg total) by mouth daily. 30 capsule 0   ELDERBERRY PO daily.      famotidine  (PEPCID ) 20 MG tablet Take 20 mg by mouth as needed for heartburn or indigestion.     gabapentin  (NEURONTIN ) 100 MG capsule Take 2 capsules (200 mg total) by mouth at bedtime. 180 capsule 0   hydrochlorothiazide  (MICROZIDE ) 12.5 MG capsule TAKE 1 CAPSULE BY MOUTH EVERY DAY 90 capsule 3   KRILL OIL PO Take by mouth in the morning and at bedtime.     levothyroxine  (SYNTHROID ) 50 MCG tablet TAKE 1 TABLET BY MOUTH EVERY DAY 90 tablet 3   losartan  (COZAAR ) 25 MG tablet Take 0.5 tablets (12.5 mg total) by mouth daily. 45 tablet 3   metaxalone  (SKELAXIN ) 400 MG tablet Take 1 tablet (400 mg total) by mouth at bedtime as needed. 20 tablet 0   Misc Natural Products (OSTEO BI-FLEX/5-LOXIN ADVANCED PO) Take by mouth 2 (two) times  daily at 8 am and 10 pm.     Multiple Vitamin (MULTIVITAMIN WITH MINERALS) TABS tablet Take 1 tablet by mouth daily.      TART CHERRY PO Take 1 tablet by mouth daily.     Turmeric 500 MG TABS Take 1 tablet by mouth 2 (two) times daily.     vitamin A 3 MG (10000 UNITS) capsule Take 10,000 Units by mouth daily.     No current facility-administered medications for this visit.     Objective:  BP 130/70   Pulse 75   Temp (!) 97.5 F (36.4 C)   Ht 5' 3 (1.6 m)   Wt 163 lb 12.8 oz (74.3 kg)   LMP  (LMP Unknown)   SpO2 97%   BMI 29.02 kg/m  Gen: NAD, resting comfortably CV: RRR faint murmur  lungs: CTAB no crackles, wheeze, rhonchi Ext: Minimal edema Skin: warm, dry   EKG: normal  rhythm with rate 62, left axis with RBBB and LAFB (bifasicular block), normal intervals other than prolonged QRS, no hypertrophy, no st or t wave changes     Assessment and Plan   # Palpitations/Shortness of breath/lightheadedness S: Patient reports shortness of breath, heart palpitations, lightheadedness that seem to be worsening-started just a few days ago with the shortness of breath and palpitations (though lightheadedness for a week)-seem to be particularly worse last night around 1 am .  Heartbeat felt irregular even with home vital signs showing heart rate of 85 earlier today.  Apparently she did check her heart rate on 7/16 and with blood pressure 110/74 heart rate was 125-but she has seen heart rate as high as 127 on her Fitbit.  -almost feels anxious with the shortness of breath- that's how she would describe it.  Lightheadedness hits first then notes the shortness of breath. Palpitations seem to come and go - were very intense last night but wonders about anxiety once again. Some stress with expensive work at home but otherwise not aware of particular stressors.  - does pretty well with hydration- 72 oz a day and urine typically a clear yellow- though was thirstier- drank some extra fluids including Gatorade as was working outside.  -lightheadedness - worse with position changes like standing -no chest pain, no left neck discomfort or left arm discomfort- dealing with left hip issue that can make her feel offbalanced. Has only taken 2 gabapentin  doses - low dose 100mg   She called cardiology this morning and they recommended following up with primary care due to lack of availability for appointment. Heating out of town next week.   - Of note patient does have history of aortic regurgitation with known faint murmur from this - just had echocardiogram 04/06/23- aortic regurgitation was only mild and no aortic stenosis. Grade I diastolic dysfunction. EF 60-65%. Reassuring.  -No history of CHF-no  edema  -She has a history of PVCs and sees Dr. Michaela the past controlled without beta-blocker as long as she avoid triggers like caffeine, sweets, stayed hydrated. Does ok with half caff coffee usually and has not changed that lately - Does have history of hypothyroidism but TSH has been controlled  -No history of DVT/PULMONARY EMBOLISM and shortness of breath is not persistent- was able to do yard work yesterday without shortness of breath and did 10 minutes walk this am    -blood pressures around 110 usually at home lately A/P: 73 year old female with history of PVCs and PACs as well as mild aortic regurgitation and known hypertension  on treatment - She is most concerned that this could be anxiety but we discussed importance of ruling out other causes -EKG without any changes (known bifascicular block ) and heart rate at 62 on that.  We discussed she could have had an abnormal rhythm last night though and she agrees to wear a cardiac monitor for 7 days-this was sent-if recurrent symptoms should seek care immediately -No obvious triggers for PVCs such as increased caffeine or sweet intake of the potential for dehydration.  Discussed beta-blocker but with heart rate 62 on EKG we opted out for now that she would prefer to discuss with Dr. Anner at a later date -Blood pressure well-controlled on losartan  12.5 mg and hydrochlorothiazide  12.5 mg.  With blood pressure at home running around 110 lately recommended holding hydrochlorothiazide  for now.  Also hotter months and could be getting dehydrated though she seems to be drinking an adequate amount of water around 72 ounces-can restart if blood pressure above 135/ 85 -No evidence of fluid overload and I strongly doubt CHF.  We thought BNP would be low yield - Discussed possible D-dimer but her main issues are palpitations and lightheadedness-this shortness of breath seems to be secondary to that.  No calf pain or edema and we opted out of the  dimer -We discussed scheduling follow-up with Dr. Anner  #hypertension and PVCs-sees Dr. Anner S: medication: Hydrochlorothiazide  12.5 mg, losartan  12.5 mg (started half tablet 11/09/2021) -PVCs typically controlled without beta-blockers-reduced triggers such as caffeine and sweets and staying well-hydrated Home readings #s: Usually around 110 systolic A/P: See discussion above-well-controlled but with some orthostatic symptoms we opted to hold hydrochlorothiazide    #hypothyroidism S: compliant On thyroid  medication-levothyroxine  50 mcg -Reassuring thyroid  ultrasound 03/03/21-3 mm nodule with no follow-up recommended Lab Results  Component Value Date   TSH 2.750 11/03/2022  A/P: Has been controlled in the past but update TSH to make sure not contributing to palpitations/heart rate variability-continue current medication for now  Recommended follow up: Return for as needed for new, worsening, persistent symptoms. Future Appointments  Date Time Provider Department Center  07/21/2023 10:30 AM [facility] HVT MONITOR [facility] H&V  08/24/2023  2:30 PM Hunsucker, Donnice SAUNDERS, MD [facility] None  09/14/2023  1:00 PM [facility] ANNUAL WELLNESS VISIT 1 [facility]  09/20/2023 11:15 AM Claudene Arthea HERO, DO [facility] None  12/06/2023 10:20 AM Katrinka, Garnette KIDD, MD [facility]    Lab/Order associations:   ICD-10-CM   1. SOB (shortness of breath) on exertion  R06.02 EKG 12-Lead    Comprehensive metabolic panel with GFR    CBC with Differential/Platelet    TSH    2. Lightheadedness  R42     3. Palpitations  R00.2 Comprehensive metabolic panel with GFR    CBC with Differential/Platelet    TSH    LONG TERM MONITOR (3-14 DAYS)    4. Essential hypertension  I10     5. Hypothyroidism, unspecified type  E03.9       Return precautions advised.  Garnette Katrinka, MD

## 2023-07-22 ENCOUNTER — Other Ambulatory Visit: Payer: Self-pay

## 2023-07-22 ENCOUNTER — Telehealth: Payer: Self-pay

## 2023-07-22 ENCOUNTER — Emergency Department (HOSPITAL_BASED_OUTPATIENT_CLINIC_OR_DEPARTMENT_OTHER)

## 2023-07-22 ENCOUNTER — Emergency Department (HOSPITAL_BASED_OUTPATIENT_CLINIC_OR_DEPARTMENT_OTHER)
Admission: EM | Admit: 2023-07-22 | Discharge: 2023-07-22 | Disposition: A | Discharge status: Home / Self Care | Attending: Emergency Medicine | Admitting: Emergency Medicine

## 2023-07-22 ENCOUNTER — Encounter (HOSPITAL_BASED_OUTPATIENT_CLINIC_OR_DEPARTMENT_OTHER): Payer: Self-pay

## 2023-07-22 DIAGNOSIS — R0602 Shortness of breath: Secondary | ICD-10-CM | POA: Diagnosis not present

## 2023-07-22 DIAGNOSIS — R079 Chest pain, unspecified: Secondary | ICD-10-CM | POA: Insufficient documentation

## 2023-07-22 DIAGNOSIS — R002 Palpitations: Secondary | ICD-10-CM | POA: Diagnosis not present

## 2023-07-22 DIAGNOSIS — E039 Hypothyroidism, unspecified: Secondary | ICD-10-CM | POA: Diagnosis not present

## 2023-07-22 DIAGNOSIS — I10 Essential (primary) hypertension: Secondary | ICD-10-CM | POA: Diagnosis not present

## 2023-07-22 LAB — CBC WITH DIFFERENTIAL/PLATELET
Abs Immature Granulocytes: 0.01 K/uL (ref 0.00–0.07)
Basophils Absolute: 0.1 K/uL (ref 0.0–0.1)
Basophils Relative: 1 %
Eosinophils Absolute: 0.2 K/uL (ref 0.0–0.5)
Eosinophils Relative: 5 %
HCT: 40.7 % (ref 36.0–46.0)
Hemoglobin: 14.2 g/dL (ref 12.0–15.0)
Immature Granulocytes: 0 %
Lymphocytes Relative: 44 %
Lymphs Abs: 1.7 K/uL (ref 0.7–4.0)
MCH: 30.9 pg (ref 26.0–34.0)
MCHC: 34.9 g/dL (ref 30.0–36.0)
MCV: 88.5 fL (ref 80.0–100.0)
Monocytes Absolute: 0.4 K/uL (ref 0.1–1.0)
Monocytes Relative: 11 %
Neutro Abs: 1.6 K/uL — ABNORMAL LOW (ref 1.7–7.7)
Neutrophils Relative %: 39 %
Platelets: 252 K/uL (ref 150–400)
RBC: 4.6 MIL/uL (ref 3.87–5.11)
RDW: 12.5 % (ref 11.5–15.5)
WBC: 4 K/uL (ref 4.0–10.5)
nRBC: 0 % (ref 0.0–0.2)

## 2023-07-22 LAB — BASIC METABOLIC PANEL WITH GFR
Anion gap: 12 (ref 5–15)
BUN: 22 mg/dL (ref 8–23)
CO2: 21 mmol/L — ABNORMAL LOW (ref 22–32)
Calcium: 9.6 mg/dL (ref 8.9–10.3)
Chloride: 107 mmol/L (ref 98–111)
Creatinine, Ser: 0.83 mg/dL (ref 0.44–1.00)
GFR, Estimated: >60 mL/min (ref >60)
Glucose, Bld: 121 mg/dL — ABNORMAL HIGH (ref 70–99)
Potassium: 3.9 mmol/L (ref 3.5–5.1)
Sodium: 139 mmol/L (ref 135–145)

## 2023-07-22 LAB — PRO BRAIN NATRIURETIC PEPTIDE: Pro Brain Natriuretic Peptide: 160 pg/mL (ref <300.0)

## 2023-07-22 LAB — TROPONIN T, HIGH SENSITIVITY
Troponin T High Sensitivity: <15 ng/L (ref <19)
Troponin T High Sensitivity: <15 ng/L (ref <19)

## 2023-07-22 LAB — D-DIMER, QUANTITATIVE: D-Dimer, Quant: 0.32 ug{FEU}/mL (ref 0.00–0.50)

## 2023-07-22 MED ORDER — NITROGLYCERIN 0.4 MG SL SUBL
0.4000 mg | SUBLINGUAL_TABLET | SUBLINGUAL | Status: DC | PRN
  Administered 2023-07-22: 0.4 mg via SUBLINGUAL
  Filled 2023-07-22: qty 1

## 2023-07-22 MED ORDER — ASPIRIN 81 MG PO CHEW
324.0000 mg | CHEWABLE_TABLET | Freq: Once | ORAL | Status: AC
  Administered 2023-07-22: 324 mg via ORAL
  Filled 2023-07-22: qty 4

## 2023-07-22 NOTE — ED Notes (Signed)
 DC paperwork given and verbally understood.

## 2023-07-22 NOTE — ED Triage Notes (Signed)
 Patient reports having heart palpitations for approximately a week and woke this morning with chest pain. Sharp gnawing constant pain in left chest. Patient reports pain started between 0100-0200.

## 2023-07-22 NOTE — Discharge Instructions (Addendum)
 Your second heart number was normal!  We have placed the referral to Cardiology> Please return if you have worsening chest pain, dyspnea or other concerns.  It was a pleasure caring for you!

## 2023-07-22 NOTE — Telephone Encounter (Signed)
 FYI.  Copied from CRM [id number]. Topic: General - Other >> Jul 22, 2023 11:10 AM Jillian Escobar wrote: Reason for CRM: Patient would like to tell Dr. Katrinka that she was at ER for chest pains and she will be following up with cardiology.

## 2023-07-22 NOTE — ED Provider Notes (Signed)
 [place] EMERGENCY DEPARTMENT AT [facility] Provider Note   CSN: [id number] Arrival date & time: 07/22/23  9354     Patient presents with: Chest Pain   Jillian Escobar is a 73 y.o. female.   HPI    73yo female with history of hypertension, borderline hyperlipidemia, hypothyroidism, aortic valve insufficiency, who presents with concern for chest pain, dyspnea and palpitations.  1 or 2 AM began to have chest discomfort, like a little bit of a sharp pain, gnawing pain, not unbearable but there. Has been having palpitations and saw PCP, happened over the weekend, Cardiology did not have appointments but was able to see PCP. Lightheadedness for about a week, palpitations over the weekend, then the chest pain last night.  Does not feel muscular. Stays right in chest, no radiation. Has had some shortness of breath, some walking, not sure if due to humidity.  Feels dyspnea just when starting to walk. Has not been having CP with exertion. Nothing makes pain better or worse, not positional, not pleuritic, not exertional  Mild nausea this AM, not sure if feeling anxiety No leg swelling or pain, takes gabapentin  for left leg numbness No cough, no fever Sweating, last night when pain started No syncope  Car trip, flight from [place] 2 weeks ago, 4 hour flight.  No hx of DVT/PE.  Does have fam hx of blood clots in father.  Not on estrogen.  Mom and dad with heart disease, diagnosed in 93s,bypass dad in 15s or 67s.  No smoking, etoh, occ weed   Past Medical History:  Diagnosis Date   Allergy    Aortic valve insufficiency    Arthritis    GERD (gastroesophageal reflux disease)    Hx of varicella    Hyperlipidemia    borderline   Hypertension    Hypothyroidism    Pneumonia 11/2014   Only time took inhalers to treat   RBBB 10/2019   recently seen at [facility] - f/u 6 mons, no treatment   Sacral back pain    Shingles 10/26/2010   Syncope and collapse 10/2019   Unclear etiology - just  saw MD - f/u 6 months - no treatment   Thyroid  disease      Prior to Admission medications   Medication Sig Start Date End Date Taking? Authorizing Provider  atorvastatin  (LIPITOR) 20 MG tablet Take 1 tablet (20 mg total) by mouth daily. 06/03/23   Rana Lum CROME, NP  calcium  carbonate (OS-CAL) 600 MG TABS tablet     [provider]  celecoxib  (CELEBREX ) 200 MG capsule Take 1 capsule (200 mg total) by mouth daily. 02/11/21   Smith, Zachary M, DO  ELDERBERRY PO daily.     [provider]  famotidine  (PEPCID ) 20 MG tablet Take 20 mg by mouth as needed for heartburn or indigestion.    [provider]  gabapentin  (NEURONTIN ) 100 MG capsule Take 2 capsules (200 mg total) by mouth at bedtime. 07/19/23   Claudene Arthea HERO, DO  hydrochlorothiazide  (MICROZIDE ) 12.5 MG capsule TAKE 1 CAPSULE BY MOUTH EVERY DAY 06/28/22   Anner Alm ORN, MD  KRILL OIL PO Take by mouth in the morning and at bedtime.    [provider]  levothyroxine  (SYNTHROID ) 50 MCG tablet TAKE 1 TABLET BY MOUTH EVERY DAY 02/14/23   Katrinka Garnette MALVA, MD  losartan  (COZAAR ) 25 MG tablet Take 0.5 tablets (12.5 mg total) by mouth daily. 11/26/22   Katrinka Garnette MALVA, MD  metaxalone  (SKELAXIN ) 400  MG tablet Take 1 tablet (400 mg total) by mouth at bedtime as needed. 11/26/22   Katrinka Garnette KIDD, MD  Misc Natural Products (OSTEO BI-FLEX/5-LOXIN ADVANCED PO) Take by mouth 2 (two) times daily at 8 am and 10 pm.    [provider]  Multiple Vitamin (MULTIVITAMIN WITH MINERALS) TABS tablet Take 1 tablet by mouth daily.     [provider]  TART CHERRY PO Take 1 tablet by mouth daily.    [provider]  Turmeric 500 MG TABS Take 1 tablet by mouth 2 (two) times daily.    [provider]  vitamin A 3 MG (10000 UNITS) capsule Take 10,000 Units by mouth daily.    [provider]    Allergies: Levaquin  [levofloxacin ]    Review of Systems  Updated Vital Signs BP (!)  136/102   Pulse 71   Temp 97.8 F (36.6 C) (Oral)   Resp 15   Ht 5' 3 (1.6 m)   Wt 73.9 kg   LMP  (LMP Unknown)   SpO2 98%   BMI 28.87 kg/m   Physical Exam Vitals and nursing note reviewed.  Constitutional:      General: She is not in acute distress.    Appearance: She is well-developed. She is not diaphoretic.  HENT:     Head: Normocephalic and atraumatic.  Eyes:     Conjunctiva/sclera: Conjunctivae normal.  Cardiovascular:     Rate and Rhythm: Normal rate and regular rhythm.     Heart sounds: Normal heart sounds. No murmur heard.    No friction rub. No gallop.  Pulmonary:     Effort: Pulmonary effort is normal. No respiratory distress.     Breath sounds: Normal breath sounds. No wheezing or rales.  Abdominal:     General: There is no distension.     Palpations: Abdomen is soft.     Tenderness: There is no abdominal tenderness. There is no guarding.  Musculoskeletal:        General: No tenderness.     Cervical back: Normal range of motion.  Skin:    General: Skin is warm and dry.     Findings: No erythema or rash.  Neurological:     Mental Status: She is alert and oriented to person, place, and time.     (all labs ordered are listed, but only abnormal results are displayed) Labs Reviewed  CBC WITH DIFFERENTIAL/PLATELET - Abnormal; Notable for the following components:      Result Value   Neutro Abs 1.6 (*)    All other components within normal limits  BASIC METABOLIC PANEL WITH GFR - Abnormal; Notable for the following components:   CO2 21 (*)    Glucose, Bld 121 (*)    All other components within normal limits  PRO BRAIN NATRIURETIC PEPTIDE  D-DIMER, QUANTITATIVE (NOT AT [facility])  TROPONIN T, HIGH SENSITIVITY    EKG: None  Radiology: DG Chest Portable 1 View Result Date: 07/22/2023 EXAM: 1 VIEW XRAY OF THE CHEST 07/22/2023 07:08:00 AM COMPARISON: 02/05/2019 CLINICAL HISTORY: Heart palpitations x 2 weeks, sharp left sided chest pain onset this morning.  FINDINGS: LUNGS AND PLEURA: No focal pulmonary opacity. No pulmonary edema. No pleural effusion. No pneumothorax. HEART AND MEDIASTINUM: No acute abnormality of the cardiac and mediastinal silhouettes. BONES AND SOFT TISSUES: No acute osseous abnormality. IMPRESSION: 1. No acute process. Electronically signed by: Waddell Calk MD 07/22/2023 07:44 AM EDT RP Workstation: [id number]     Procedures   Medications  Ordered in the ED  nitroGLYCERIN (NITROSTAT) SL tablet 0.4 mg (0.4 mg Sublingual Given 07/22/23 0729)  aspirin chewable tablet 324 mg (324 mg Oral Given 07/22/23 0728)                                     72yo female with history of hypertension, borderline hyperlipidemia, hypothyroidism, aortic valve insufficiency, who presents with concern for chest pain, dyspnea and palpitations.  Differential diagnosis for chest pain includes pulmonary embolus, dissection, pneumothorax, pneumonia, ACS, myocarditis, pericarditis, anemia, electrolyte abnormality, arrhythmia.    EKG was done and evaluate by me and showed no acute ST changes and no signs of pericarditis, unchanged from prior.   Chest x-ray was done and evaluated by me and radiology and showed no sign of pneumonia or pneumothorax.  Labs completed and personally via interpreted by me show no anemia, no leukocytosis, no clinically significant electrolyte abnormalities.  proBNP is normal and have low suspicion for acute CHF.  She is low risk Wells with a negative D-dimer and have low suspicion for PE.  History and exam are not consistent with with aortic dissection.  Troponin negative x2. Does have some atypical features, notes palpitations as well. Coronary calcium  score 0 03/2022. Recommend follow up with Cardiology and referral sent.  PCP has ordered long term monitor. Patient discharged in stable condition with understanding of reasons to return.       Final diagnoses:  Chest pain, unspecified type  Palpitations    ED Discharge  Orders          Ordered    Ambulatory referral to Cardiology        07/22/23 9185               Dreama Longs, MD 07/22/23 2335

## 2023-07-23 ENCOUNTER — Ambulatory Visit: Payer: Self-pay | Admitting: Family Medicine

## 2023-07-25 ENCOUNTER — Ambulatory Visit: Payer: Medicare Other

## 2023-07-25 NOTE — Telephone Encounter (Signed)
 I appreciate the follow-up and glad she is getting cardiology care soon

## 2023-08-02 ENCOUNTER — Ambulatory Visit (INDEPENDENT_AMBULATORY_CARE_PROVIDER_SITE_OTHER): Admitting: Family Medicine

## 2023-08-15 ENCOUNTER — Other Ambulatory Visit: Payer: Self-pay

## 2023-08-15 DIAGNOSIS — R002 Palpitations: Secondary | ICD-10-CM | POA: Diagnosis not present

## 2023-08-17 NOTE — Progress Notes (Signed)
 Cardiology Office Note   Date:  08/18/2023  ID:  Jillian Escobar, DOB Aug 02, 1950, MRN [id number] PCP: Jillian Garnette MALVA, MD  [facility] Providers Cardiologist:  Alm Clay, MD    History of Present Illness Jillian Escobar is a 73 y.o. female with a past medical history of palpitations, bifascicular block, aortic insufficiency, and syncope here for follow-up appointment.  She was last seen in February of this year and did not have any major cardiovascular concerns or complaints.  Denies chest pain, dyspnea, orthopnea, PND, palpitations.  Has not experienced any episodes of syncope.  She attributed her past episode to marijuana use and dehydration.  Also mentions a recent toe injury which has limited her physical activity and contributed to some weight gain.  She does work out at [employer] well and does not experience any exertional angina.  Does drink some alcohol specifically bourbon.  Today, she presents today with chest pain and palpitations.  She experienced chest pain between 1 to 2 AM, leading her to the emergency department. An EKG, troponin test, and chest x-ray were performed, all normal. She has episodes of a racing heart and palpitations, more pronounced the previous day. A heart monitor showed sinus bradycardia and a short run of nonsustained supraventricular tachycardia. Anxiety accompanies her racing heart, potentially worsening palpitations.  Recent labs revealed a low potassium level of 3.4, which increased to 3.9. Glucose was elevated due to non-fasting status, with normal hemoglobin and thyroid  function. She is unsure if magnesium  levels were checked.  She reports poor sleep quality and is scheduled to see a pulmonologist for potential sleep apnea. She felt well-rested the previous night. Her social history includes regular alcohol consumption, specifically bourbon, with a cocktail hour before dinner every night. She is physically active, attending aerobics classes, and  enjoys outdoor activities with her dog.  Reports no shortness of breath nor dyspnea on exertion. No edema, orthopnea, PND.   Discussed the use of AI scribe software for clinical note transcription with the patient, who gave verbal consent to proceed.   ROS: Pertinent ROS in HPI  Studies Reviewed      CT cardiac scoring 04/01/2022 1. Coronary calcium  score of 0. This was 1st percentile for age, gender, and race matched controls.   2. Aortic atherosclerosis.   Echocardiogram 10/07/2020 1. Left ventricular ejection fraction, by estimation, is 60 to 65%. The  left ventricle has normal function. The left ventricle has no regional  wall motion abnormalities. There is mild concentric left ventricular  hypertrophy. Left ventricular diastolic  parameters are consistent with Grade II diastolic dysfunction  (pseudonormalization). The average left ventricular global longitudinal  strain is -19.4 %. The global longitudinal strain is normal.   2. Right ventricular systolic function is normal. The right ventricular  size is normal. There is normal pulmonary artery systolic pressure.   3. The mitral valve is normal in structure. Trivial mitral valve  regurgitation. No evidence of mitral stenosis.   4. The aortic valve is normal in structure. Aortic valve regurgitation is  mild to moderate. No aortic stenosis is present.   5. The inferior vena cava is normal in size with greater than 50%  respiratory variability, suggesting right atrial pressure of 3 mmHg.    ZIO 05/04/2019 10-day event monitor showed sinus rhythm with rate ranging from 46-131 bpm. Average heart rate 73 bpm. Rhythm was 100% sinus with intermittent bundle branch block. Very Rare isolated PACs and PVCs noted. No couplets or triplets noted. No bigeminy.  No arrhythmias-tachycardia or bradycardia. No pauses      Physical Exam VS:  BP 116/62   Pulse 68   Ht 5' 3 (1.6 m)   Wt 168 lb 9.6 oz (76.5 kg)   LMP  (LMP Unknown)   SpO2  98%   BMI 29.87 kg/m        Wt Readings from Last 3 Encounters:  08/18/23 168 lb 9.6 oz (76.5 kg)  07/22/23 163 lb (73.9 kg)  07/21/23 163 lb 12.8 oz (74.3 kg)    GEN: Well nourished, well developed in no acute distress NECK: No JVD; No carotid bruits CARDIAC: RRR, no murmurs, rubs, gallops RESPIRATORY:  Clear to auscultation without rales, wheezing or rhonchi  ABDOMEN: Soft, non-tender, non-distended EXTREMITIES:  No edema; No deformity   ASSESSMENT AND PLAN  Palpitations and nonsustained supraventricular tachycardia (SVT) Intermittent palpitations and nonsustained SVT with recent zio monitor showing sinus bradycardia and short SVT run. No dangerous arrhythmias detected. Symptoms may be exacerbated by anxiety, low magnesium , and alcohol. Possibly stress induced. - Check serum magnesium  level. - Educated on magnesium  oxide supplementation if low. - Discussed alcohol moderation. - Consider beta blocker if episodes increase.  Possible sleep apnea Possible sleep apnea contributing to palpitations and poor sleep quality. Scheduled pulmonology evaluation next week. - Proceed with pulmonology appointment. - Consider sleep study if recommended.  Electrolyte abnormality: hypokalemia Previous hypokalemia episode resolved with supplementation. Current electrolyte levels need monitoring, especially magnesium . - Check serum magnesium  level.  Mild aortic valve regurgitation Mild aortic valve regurgitation stable. No significant stenosis or other valvular issues. - Routine echocardiogram every few years.  Hypertension Hypertension well controlled without HCTZ. - Reassess need for HCTZ if blood pressure changes.   Dispo: She can follow-up in 6 months  Signed, Orren LOISE Fabry, PA-C

## 2023-08-18 ENCOUNTER — Encounter: Payer: Self-pay | Admitting: Physician Assistant

## 2023-08-18 ENCOUNTER — Ambulatory Visit: Attending: Physician Assistant | Admitting: Physician Assistant

## 2023-08-18 VITALS — BP 116/62 | HR 68 | Ht 63.0 in | Wt 168.6 lb

## 2023-08-18 DIAGNOSIS — R002 Palpitations: Secondary | ICD-10-CM | POA: Diagnosis present

## 2023-08-18 DIAGNOSIS — Z87898 Personal history of other specified conditions: Secondary | ICD-10-CM | POA: Diagnosis present

## 2023-08-18 DIAGNOSIS — I10 Essential (primary) hypertension: Secondary | ICD-10-CM | POA: Diagnosis present

## 2023-08-18 DIAGNOSIS — Z8249 Family history of ischemic heart disease and other diseases of the circulatory system: Secondary | ICD-10-CM

## 2023-08-18 DIAGNOSIS — E785 Hyperlipidemia, unspecified: Secondary | ICD-10-CM | POA: Insufficient documentation

## 2023-08-18 DIAGNOSIS — I351 Nonrheumatic aortic (valve) insufficiency: Secondary | ICD-10-CM | POA: Diagnosis not present

## 2023-08-18 NOTE — Patient Instructions (Addendum)
 Medication Instructions:  Your physician recommends that you continue on your current medications as directed. Please refer to the Current Medication list given to you today.  *If you need a refill on your cardiac medications before your next appointment, please call your pharmacy*  Lab Work: TODAY: MAGNESIUM   If you have labs (blood work) drawn today and your tests are completely normal, you will receive your results only by: MyChart Message (if you have MyChart) OR A paper copy in the mail If you have any lab test that is abnormal or we need to change your treatment, we will call you to review the results.  Testing/Procedures: NONE  Follow-Up: At [facility], you and your health needs are our priority.  As part of our continuing mission to provide you with exceptional heart care, our providers are all part of one team.  This team includes your primary Cardiologist (physician) and Advanced Practice Providers or APPs (Physician Assistants and Nurse Practitioners) who all work together to provide you with the care you need, when you need it.  Your next appointment:   6 month(s)  Provider:   Alm Clay, MD

## 2023-08-19 ENCOUNTER — Ambulatory Visit: Payer: Self-pay | Admitting: *Deleted

## 2023-08-19 LAB — MAGNESIUM: Magnesium: 2.1 mg/dL (ref 1.6–2.3)

## 2023-08-24 ENCOUNTER — Ambulatory Visit (INDEPENDENT_AMBULATORY_CARE_PROVIDER_SITE_OTHER): Admitting: Pulmonary Disease

## 2023-08-24 ENCOUNTER — Encounter: Payer: Self-pay | Admitting: Pulmonary Disease

## 2023-08-24 VITALS — BP 130/72 | HR 64 | Temp 98.3°F | Ht 63.0 in | Wt 168.8 lb

## 2023-08-24 DIAGNOSIS — G4719 Other hypersomnia: Secondary | ICD-10-CM

## 2023-08-24 NOTE — Progress Notes (Signed)
 @Patient  ID: Jillian Escobar, female    DOB: 29-Apr-1950, 73 y.o.   MRN: [id number]  Chief Complaint  Patient presents with   Consult    Pt is waking up with body soreness and stiffness. Pt states she also feels palpitations.  Has never had a sleep study done before.      Referring provider: Claudene Arthea HERO, DO  HPI:   73 y.o. woman whom we are seeing for evaluation of possible OSA.  Multiple PCP notes reviewed.  Here for evaluation of issues with possible breathing at night.  She is excessively sleepy during the day.  Feels like she could not off.  She does not nap.  Her STOP-BANG score is 4.  High risk.  She was having some heart palpitations in the morning.  Raise concern for possible hypoxemia at night.  Also with increased joint stiffness in the morning.  Lasted few minutes.  Large joints hips shoulders etc.  Has been seen by rheumatology in the past.  Diagnosed with osteoporosis.  Not worried about rheumatologic issues.  We discussed at length role rationale for sleep apnea testing.  The treatment CPAP in the future.  Alternative treatment options such as oral appliance, inspire device.  She expressed understanding.  She is eager to move forward with her evaluation.  Questionaires / Pulmonary Flowsheets:   ACT:      No data to display          MMRC:     No data to display          Epworth:      No data to display          Tests:   FENO:  No results found for: NITRICOXIDE  PFT:    Latest Ref Rng & Units 01/20/2015   11:45 AM  PFT Results  FVC-Pre L 2.88   FVC-Predicted Pre % 88   FVC-Post L 2.99   FVC-Predicted Post % 92   Pre FEV1/FVC % % 76   Post FEV1/FCV % % 77   FEV1-Pre L 2.18   FEV1-Predicted Pre % 87   FEV1-Post L 2.30   DLCO uncorrected ml/min/mmHg 21.72   DLCO UNC% % 89   DLVA Predicted % 97   TLC L 5.13   TLC % Predicted % 101   RV % Predicted % 104   Personally viewed interpreted as normal spirometry, no bronchodilator  response, lung volumes within normal notes, DLCO within normal meds.  WALK:      No data to display          Imaging: Personally viewed and as per EMR and  discussion this note LONG TERM MONITOR (3-14 DAYS) Result Date: 08/15/2023 NSR with sinus brady (48/min) and sinus tachy (117/min), ave 70/min. One 11 beat run of NS SVT, 107/min. No VT, Atrial fib or sustained SVT. No prolonged pauses Rare PAC's and PVC's (<1%). Symptoms associated with NSR and PVC's. Patch Wear Time:  7 days and 7 hours (2025-07-25T08:14:35-0400 to 2025-08-01T15:31:11-398)   Lab Results: Personally reviewed CBC    Component Value Date/Time   WBC 4.0 07/22/2023 0657   RBC 4.60 07/22/2023 0657   HGB 14.2 07/22/2023 0657   HGB 14.8 03/18/2022 0847   HCT 40.7 07/22/2023 0657   HCT 43.3 03/18/2022 0847   PLT 252 07/22/2023 0657   PLT 300 03/18/2022 0847   MCV 88.5 07/22/2023 0657   MCV 91 03/18/2022 0847   MCH 30.9 07/22/2023 0657   MCHC 34.9  07/22/2023 0657   RDW 12.5 07/22/2023 0657   RDW 12.6 03/18/2022 0847   LYMPHSABS 1.7 07/22/2023 0657   LYMPHSABS 1.5 03/18/2022 0847   MONOABS 0.4 07/22/2023 0657   EOSABS 0.2 07/22/2023 0657   EOSABS 0.2 03/18/2022 0847   BASOSABS 0.1 07/22/2023 0657   BASOSABS 0.1 03/18/2022 0847    BMET    Component Value Date/Time   NA 139 07/22/2023 0657   NA 140 04/04/2023 1213   K 3.9 07/22/2023 0657   CL 107 07/22/2023 0657   CO2 21 (L) 07/22/2023 0657   GLUCOSE 121 (H) 07/22/2023 0657   BUN 22 07/22/2023 0657   BUN 22 04/04/2023 1213   CREATININE 0.83 07/22/2023 0657   CALCIUM  9.6 07/22/2023 0657   GFRNONAA >60 07/22/2023 0657   GFRAA 73 04/04/2019 1234    BNP    Component Value Date/Time   BNP 85.3 10/17/2017 0930    ProBNP    Component Value Date/Time   PROBNP 160.0 07/22/2023 0657    Specialty Problems       Pulmonary Problems   Cough, persistent recurrent   Exertional shortness of breath    Allergies  Allergen Reactions   Levaquin   [Levofloxacin ]     See notes  September 2019  Palpitations irritability sleep issues      Immunization History  Administered Date(s) Administered   Fluad  Quad(high Dose 65+) 08/30/2018, 10/15/2020, 10/07/2021   Fluad  Trivalent(High Dose 65+) 09/15/2022   Hep A / Hep B 02/28/2009, 03/28/2009   Hepatitis A 08/29/2009   Hepatitis B 07/31/2009   Influenza, High Dose Seasonal PF 10/31/2017, 10/07/2021   Influenza-Unspecified 10/02/2019, 10/02/2019, 10/15/2020, 10/07/2021   Meningococcal polysaccharide vaccine (MPSV4) 02/28/2009   Moderna SARS-COV2 Booster Vaccination 10/14/2021   PFIZER(Purple Top)SARS-COV-2 Vaccination 01/26/2019, 02/16/2019, 10/02/2019   Pfizer Covid-19 Vaccine Bivalent Booster 71yrs & up 10/15/2020   Pfizer(Comirnaty )Fall Seasonal Vaccine 12 years and older 10/14/2021, 09/15/2022   Pneumococcal Conjugate Pcv21, Polysaccharide Crm197 Conjugaf 06/29/2023   Pneumococcal Conjugate-13 08/02/2018   Pneumococcal Polysaccharide-23 08/21/2019   Td 01/04/2002, 02/28/2009   Tdap 11/10/2021   Zoster Recombinant(Shingrix ) 03/25/2021, 07/14/2021    Past Medical History:  Diagnosis Date   Allergy    Aortic valve insufficiency    Arthritis    GERD (gastroesophageal reflux disease)    Hx of varicella    Hyperlipidemia    borderline   Hypertension    Hypothyroidism    Pneumonia 11/2014   Only time took inhalers to treat   RBBB 10/2019   recently seen at [facility] - f/u 6 mons, no treatment   Sacral back pain    Shingles 10/26/2010   Syncope and collapse 10/2019   Unclear etiology - just saw MD - f/u 6 months - no treatment   Thyroid  disease     Tobacco History: Social History   Tobacco Use  Smoking Status Former   Current packs/day: 0.00   Types: Cigarettes   Quit date: 01/04/1982   Years since quitting: 41.6  Smokeless Tobacco Never  Tobacco Comments   Marijuana occasionally - roughly twice a week.    Counseling given: Not Answered Tobacco comments: Marijuana  occasionally - roughly twice a week.    Continue to not smoke  Outpatient Encounter Medications as of 08/24/2023  Medication Sig   atorvastatin  (LIPITOR) 20 MG tablet Take 1 tablet (20 mg total) by mouth daily.   calcium  carbonate (OS-CAL) 600 MG TABS tablet    ELDERBERRY PO daily.    famotidine  (PEPCID ) 20 MG  tablet Take 20 mg by mouth as needed for heartburn or indigestion.   gabapentin  (NEURONTIN ) 100 MG capsule Take 2 capsules (200 mg total) by mouth at bedtime.   KRILL OIL PO Take by mouth in the morning and at bedtime.   levothyroxine  (SYNTHROID ) 50 MCG tablet TAKE 1 TABLET BY MOUTH EVERY DAY   losartan  (COZAAR ) 25 MG tablet Take 0.5 tablets (12.5 mg total) by mouth daily.   Misc Natural Products (OSTEO BI-FLEX/5-LOXIN ADVANCED PO) Take by mouth 2 (two) times daily at 8 am and 10 pm.   Multiple Vitamin (MULTIVITAMIN WITH MINERALS) TABS tablet Take 1 tablet by mouth daily.    TART CHERRY PO Take 1 tablet by mouth daily.   Turmeric 500 MG TABS Take 1 tablet by mouth 2 (two) times daily.   vitamin A 3 MG (10000 UNITS) capsule Take 10,000 Units by mouth daily.   celecoxib  (CELEBREX ) 200 MG capsule Take 200 mg by mouth daily. (Patient not taking: Reported on 08/24/2023)   hydrochlorothiazide  (MICROZIDE ) 12.5 MG capsule TAKE 1 CAPSULE BY MOUTH EVERY DAY (Patient not taking: Reported on 08/24/2023)   metaxalone  (SKELAXIN ) 400 MG tablet Take 1 tablet (400 mg total) by mouth at bedtime as needed. (Patient not taking: Reported on 08/24/2023)   No facility-administered encounter medications on file as of 08/24/2023.     Review of Systems  Review of Systems  No chest pain with exertion.  No orthopnea or PND.  Comprehensive review of systems otherwise negative. Physical Exam  BP 130/72   Pulse 64   Temp 98.3 F (36.8 C)   Ht 5' 3 (1.6 m)   Wt 168 lb 12.8 oz (76.6 kg)   LMP  (LMP Unknown)   SpO2 99% Comment: RA  BMI 29.90 kg/m   Wt Readings from Last 5 Encounters:  08/24/23 168 lb  12.8 oz (76.6 kg)  08/18/23 168 lb 9.6 oz (76.5 kg)  07/22/23 163 lb (73.9 kg)  07/21/23 163 lb 12.8 oz (74.3 kg)  07/19/23 165 lb (74.8 kg)    BMI Readings from Last 5 Encounters:  08/24/23 29.90 kg/m  08/18/23 29.87 kg/m  07/22/23 28.87 kg/m  07/21/23 29.02 kg/m  07/19/23 29.23 kg/m     Physical Exam General: Sitting in chair, no acute distress Eyes: EOMI, no icterus Neck: Supple, no JVP appreciated Pulmonary: Clear, no work of breathing Cardiovascular regular rhythm, no murmur Abdomen: Nondistended MSK: No synovitis no joint fusion Neuro: Normal gait, no weakness Psych: Normal mood, full affect   Assessment & Plan:   Excessive daytime sleepiness: Concern for sleep apnea.  STOP-BANG score 4.  High risk.  Home sleep test ordered today.  Joint pain/stiffness: Possibly due to chronic osteoarthritis likely flared with position when sleeping.  Unlikely to be related to possible OSA.  Encouraged ongoing sports medicine follow-up.   No follow-ups on file.   Jillian JONELLE Beals, MD 08/24/2023

## 2023-08-24 NOTE — Patient Instructions (Signed)
 Nice to meet you  I have ordered a home sleep test  I will order CPAP if the test is consistent with sleep apnea  Return to clinic based on results

## 2023-08-29 IMAGING — US US THYROID
1 series · 14 of 25 positions shown · non-contrast
Comparison: None.

CLINICAL DATA: Enlarged thyroid, hypothyroid

EXAM:
THYROID ULTRASOUND
TECHNIQUE: Ultrasound examination of the thyroid gland and adjacent soft
tissues was performed.

[Series 1: us thyroid · 0.06mm/px · 14 of 33 slices shown]
[im 1/33]
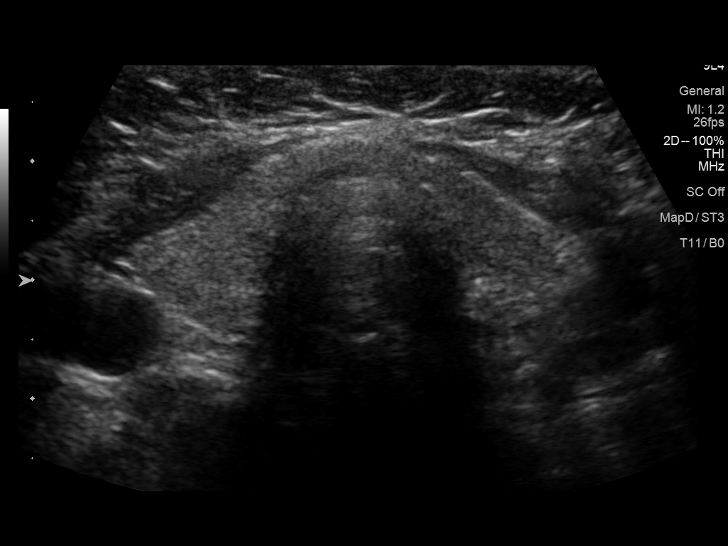
[im 3/33]
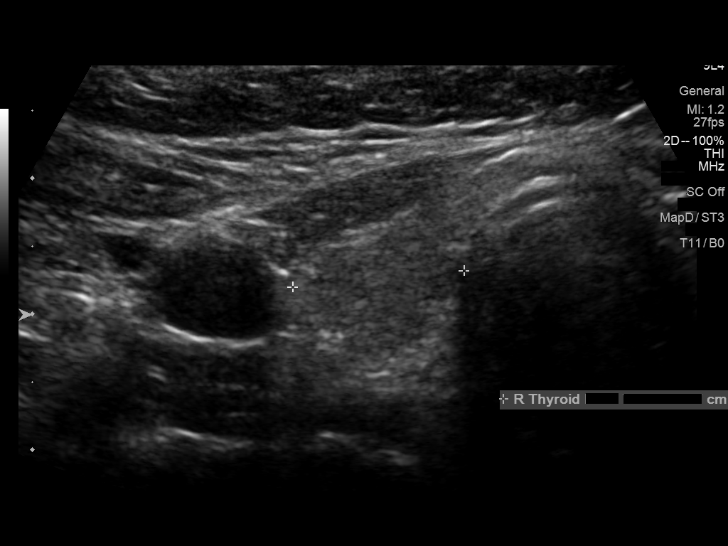
[im 6/33]
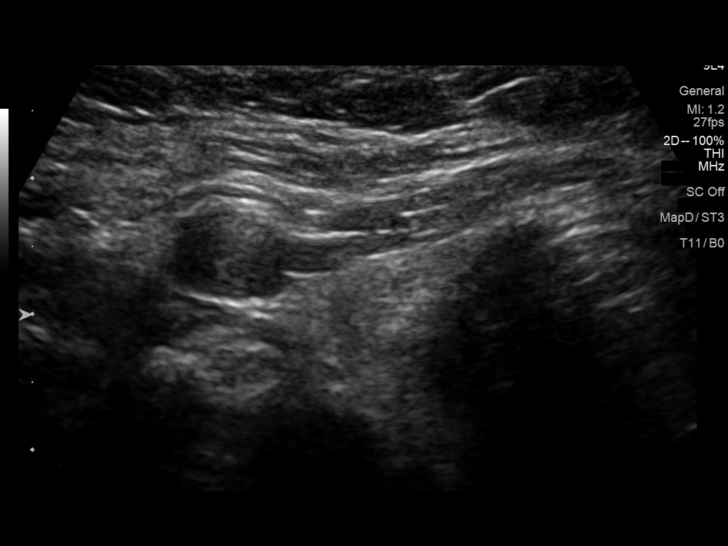
[im 9/33]
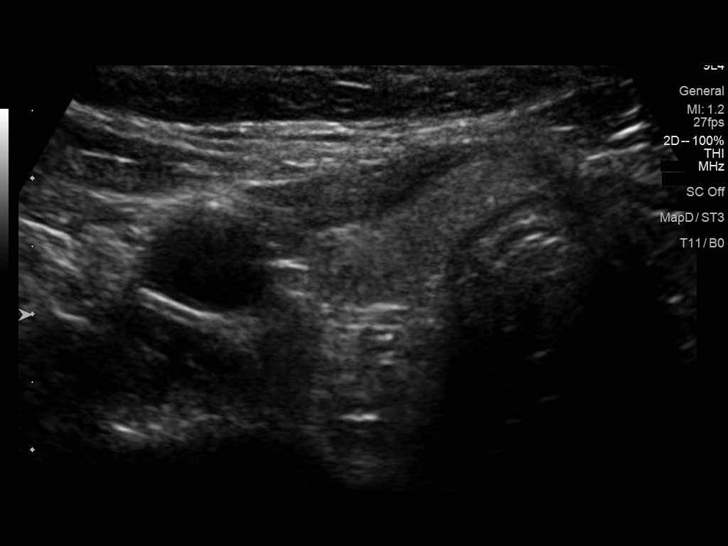
[im 11/33]
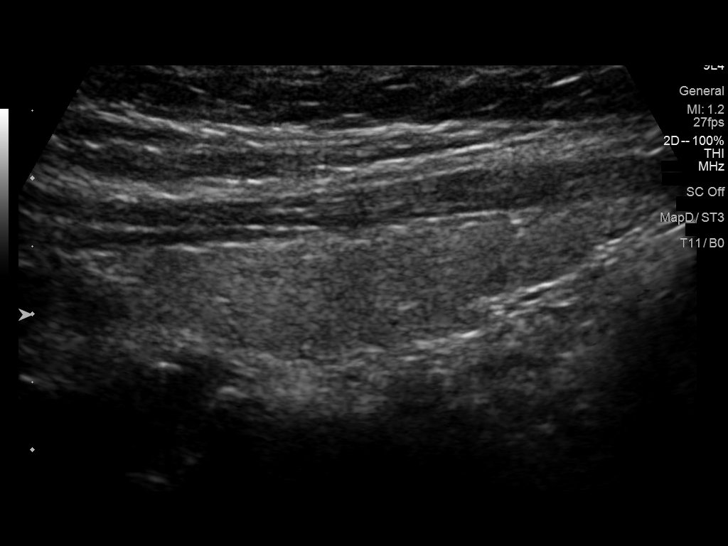
[im 13/33]
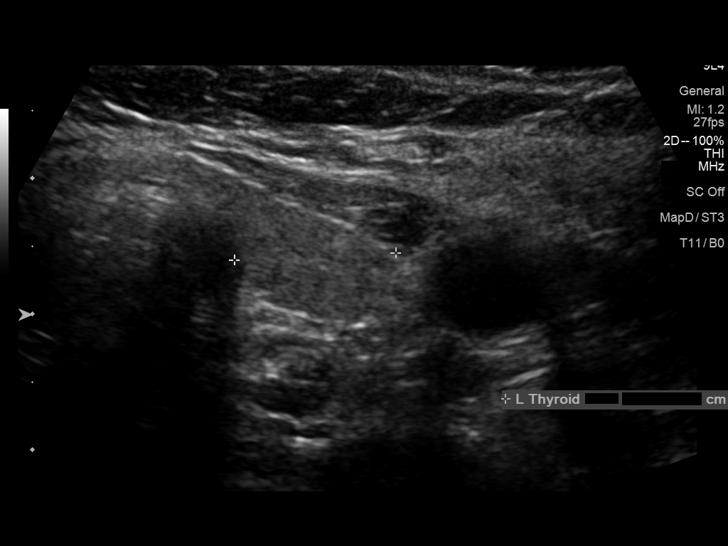
[im 15/33]
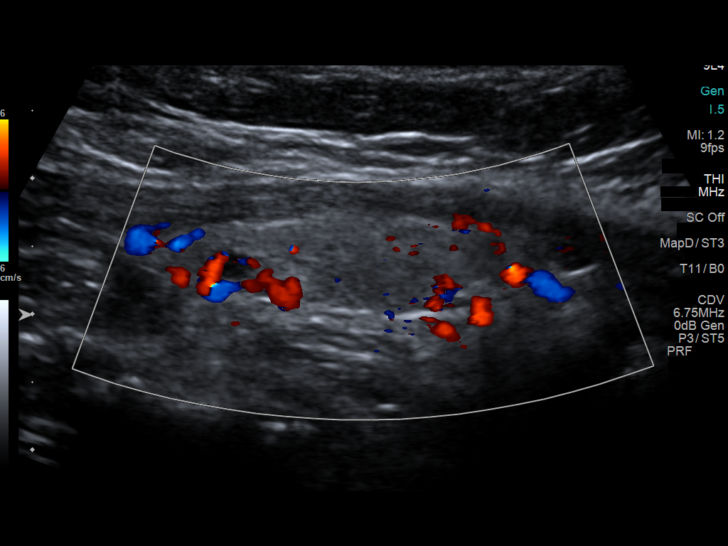
[im 18/33]
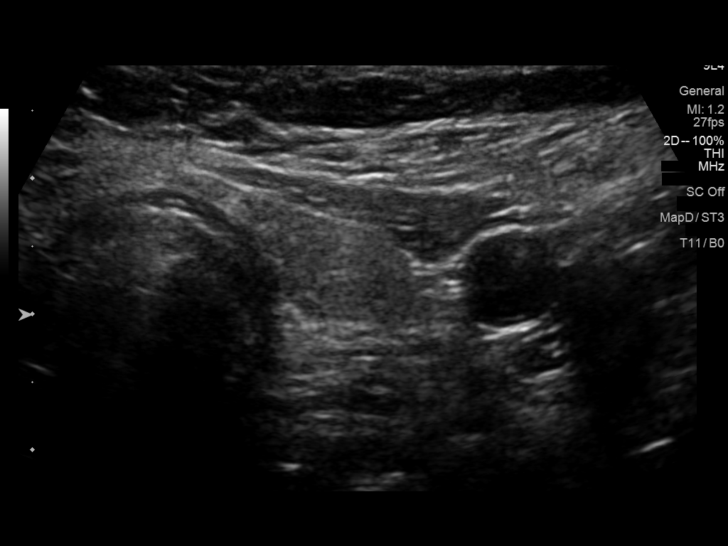
[im 21/33]
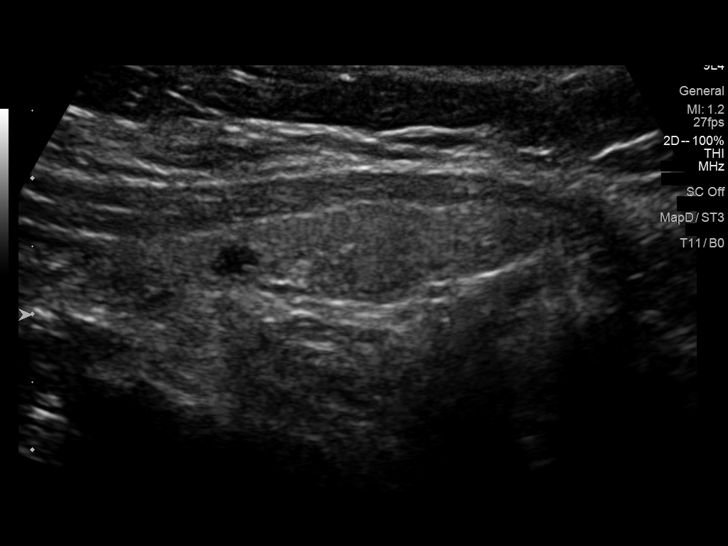
[im 22/33]
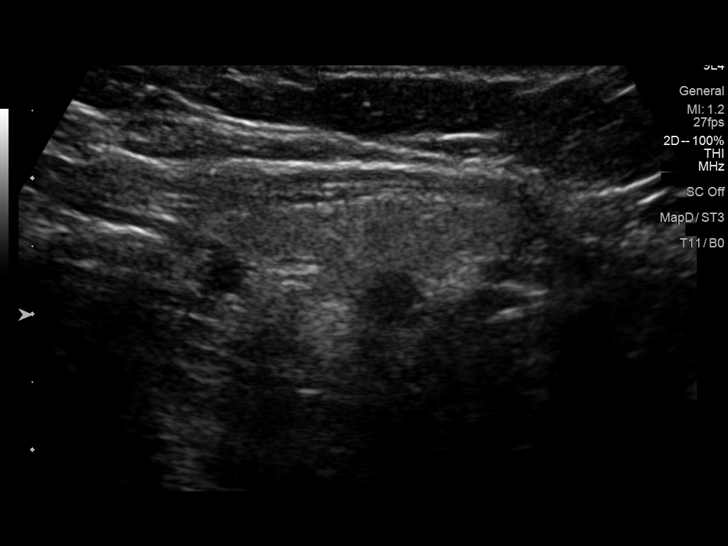
[im 25/33]
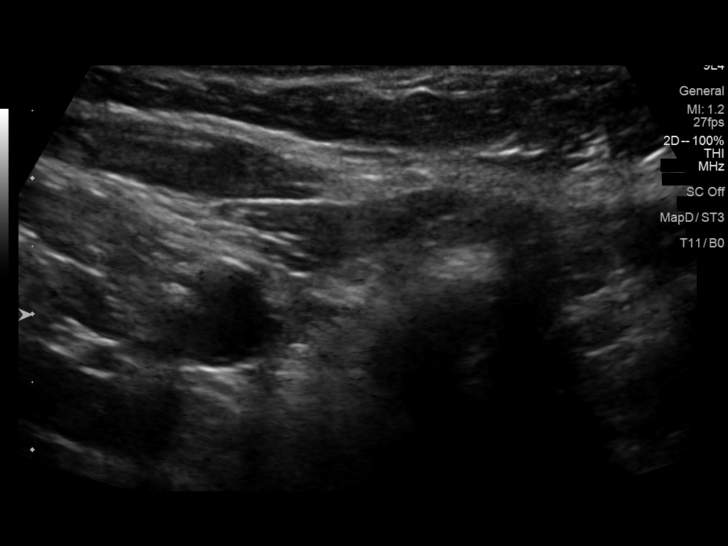
[im 27/33]
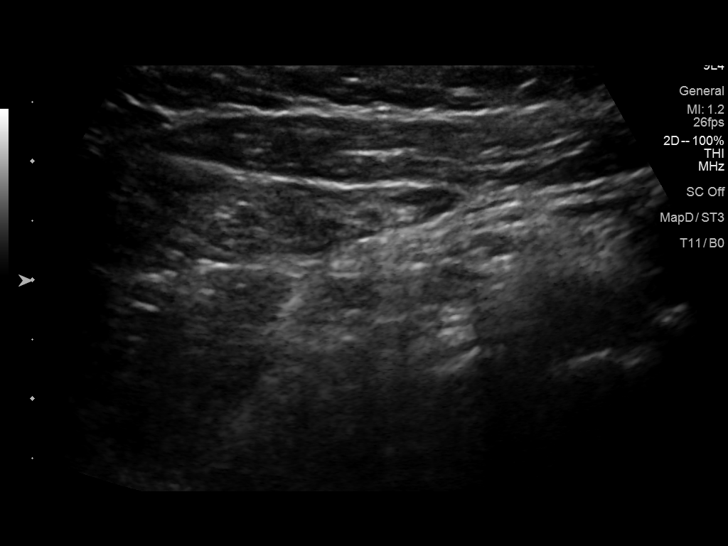
[im 30/33]
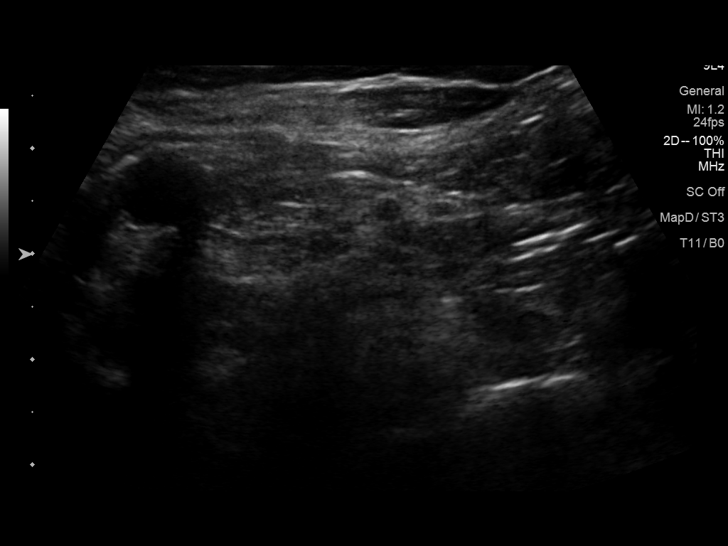
[im 33/33]
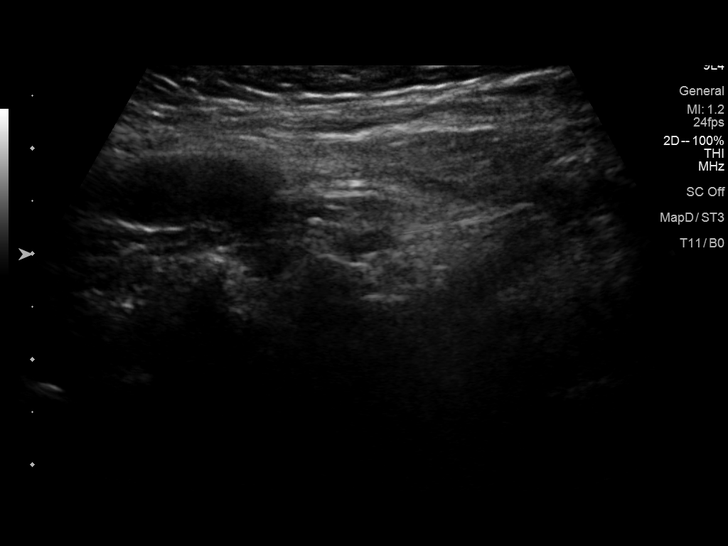

[14 of 25 positions shown; findings below may reference images not displayed]

FINDINGS: Parenchymal Echotexture: Normal

Isthmus: 2 mm

Right lobe: 4.3 x 1.0 x 1.3 cm

Left lobe: 3.0 x 0.7 x 1.2 cm

_________________________________________________________

Estimated total number of nodules >/= 1 cm: 0

Number of spongiform nodules >/=  2 cm not described below (TR1): 0

Number of mixed cystic and solid nodules >/= 1.5 cm not described
below (TR2): 0

_________________________________________________________

Normal thyroid echotexture and vascularity. Incidental subcentimeter
benign left superior thyroid cystic nodule measures only 3 mm. No
other significant thyroid abnormality. No regional adenopathy.
IMPRESSION: No significant thyroid finding.  See above comment.

The above is in keeping with the ACR TI-RADS recommendations - [HOSPITAL] 5404;[DATE].

## 2023-09-14 ENCOUNTER — Ambulatory Visit (INDEPENDENT_AMBULATORY_CARE_PROVIDER_SITE_OTHER)

## 2023-09-14 ENCOUNTER — Encounter: Payer: Self-pay | Admitting: Family Medicine

## 2023-09-14 ENCOUNTER — Other Ambulatory Visit (HOSPITAL_BASED_OUTPATIENT_CLINIC_OR_DEPARTMENT_OTHER): Payer: Self-pay

## 2023-09-14 VITALS — Ht 63.0 in | Wt 165.0 lb

## 2023-09-14 DIAGNOSIS — Z0000 Encounter for general adult medical examination without abnormal findings: Secondary | ICD-10-CM | POA: Diagnosis not present

## 2023-09-14 MED ORDER — FLUZONE HIGH-DOSE 0.5 ML IM SUSY
0.5000 mL | PREFILLED_SYRINGE | Freq: Once | INTRAMUSCULAR | 0 refills | Status: AC
Start: 2023-09-14 — End: 2023-09-15
  Filled 2023-09-14: qty 0.5, 1d supply, fill #0

## 2023-09-14 NOTE — Patient Instructions (Signed)
 Jillian Escobar,  Thank you for taking the time for your Medicare Wellness Visit. I appreciate your continued commitment to your health goals. Please review the care plan we discussed, and feel free to reach out if I can assist you further.  Medicare recommends these wellness visits once per year to help you and your care team stay ahead of potential health issues. These visits are designed to focus on prevention, allowing your provider to concentrate on managing your acute and chronic conditions during your regular appointments.  Please note that Annual Wellness Visits do not include a physical exam. Some assessments may be limited, especially if the visit was conducted virtually. If needed, we may recommend a separate in-person follow-up with your provider.  Ongoing Care Seeing your primary care provider every 3 to 6 months helps us  monitor your health and provide consistent, personalized care.   Referrals If a referral was made during today's visit and you haven't received any updates within two weeks, please contact the referred provider directly to check on the status.  Recommended Screenings:  Health Maintenance  Topic Date Due   Medicare Annual Wellness Visit  07/19/2023   COVID-19 Vaccine (6 - Pfizer risk 2024-25 season) 09/05/2023   Mammogram  02/28/2024   Colon Cancer Screening  10/28/2029   DTaP/Tdap/Td vaccine (4 - Td or Tdap) 11/11/2031   Pneumococcal Vaccine for age over 25  Completed   Flu Shot  Completed   DEXA scan (bone density measurement)  Completed   Hepatitis C Screening  Completed   Zoster (Shingles) Vaccine  Completed   HPV Vaccine  Aged Out   Meningitis B Vaccine  Aged Out   Hepatitis B Vaccine  Discontinued       09/14/2023    1:06 PM  Advanced Directives  Does Patient Have a Medical Advance Directive? Yes  Type of Estate agent of [place];Living will  Copy of Healthcare Power of Attorney in Chart? No - copy requested   Advance Care  Planning is important because it: Ensures you receive medical care that aligns with your values, goals, and preferences. Provides guidance to your family and loved ones, reducing the emotional burden of decision-making during critical moments.  Vision: Annual vision screenings are recommended for early detection of glaucoma, cataracts, and diabetic retinopathy. These exams can also reveal signs of chronic conditions such as diabetes and high blood pressure.  Dental: Annual dental screenings help detect early signs of oral cancer, gum disease, and other conditions linked to overall health, including heart disease and diabetes.  Please see the attached documents for additional preventive care recommendations.

## 2023-09-14 NOTE — Progress Notes (Addendum)
 Subjective:   Jillian Escobar is a 73 y.o. who presents for a Medicare Wellness preventive visit.  As a reminder, Annual Wellness Visits don't include a physical exam, and some assessments may be limited, especially if this visit is performed virtually. We may recommend an in-person follow-up visit with your provider if needed.  Visit Complete: Virtual I connected with  Jillian Escobar on 09/14/23 by a audio enabled telemedicine application and verified that I am speaking with the correct person using two identifiers.  Patient Location: Home  Provider Location: Home Office  I discussed the limitations of evaluation and management by telemedicine. The patient expressed understanding and agreed to proceed.  Vital Signs: Because this visit was a virtual/telehealth visit, some criteria may be missing or patient reported. Any vitals not documented were not able to be obtained and vitals that have been documented are patient reported.  VideoDeclined- This patient declined Librarian, academic. Therefore the visit was completed with audio only.  Persons Participating in Visit: Patient.  AWV Questionnaire: Yes: Patient Medicare AWV questionnaire was completed by the patient on 09/10/23; I have confirmed that all information answered by patient is correct and no changes since this date.        Objective:    Today's Vitals   09/14/23 1259  Weight: 165 lb (74.8 kg)  Height: 5' 3 (1.6 m)   Body mass index is 29.23 kg/m.     09/14/2023    1:06 PM 07/22/2023    6:56 AM 07/19/2022    8:09 AM 06/03/2022   12:24 PM 04/13/2022    5:33 AM 05/04/2021   10:04 AM 03/19/2020    2:17 PM  Advanced Directives  Does Patient Have a Medical Advance Directive? Yes No Yes Yes No Yes Yes  Type of Estate agent of [place];Living will  Healthcare Power of [place];Living will   Healthcare Power of [place];Living will Healthcare Power of [place];Living will   Does patient want to make changes to medical advance directive?    No - Patient declined   No - Patient declined  Copy of Healthcare Power of Attorney in Chart? No - copy requested  No - copy requested   No - copy requested No - copy requested  Would patient like information on creating a medical advance directive?  No - Patient declined   No - Patient declined      Current Medications (verified) Outpatient Encounter Medications as of 09/14/2023  Medication Sig   atorvastatin  (LIPITOR) 20 MG tablet Take 1 tablet (20 mg total) by mouth daily.   calcium  carbonate (OS-CAL) 600 MG TABS tablet    ELDERBERRY PO daily.    famotidine  (PEPCID ) 20 MG tablet Take 20 mg by mouth as needed for heartburn or indigestion.   gabapentin  (NEURONTIN ) 100 MG capsule Take 2 capsules (200 mg total) by mouth at bedtime.   KRILL OIL PO Take by mouth in the morning and at bedtime.   levothyroxine  (SYNTHROID ) 50 MCG tablet TAKE 1 TABLET BY MOUTH EVERY DAY   losartan  (COZAAR ) 25 MG tablet Take 0.5 tablets (12.5 mg total) by mouth daily.   Misc Natural Products (OSTEO BI-FLEX/5-LOXIN ADVANCED PO) Take by mouth 2 (two) times daily at 8 am and 10 pm.   Multiple Vitamin (MULTIVITAMIN WITH MINERALS) TABS tablet Take 1 tablet by mouth daily.    TART CHERRY PO Take 1 tablet by mouth daily.   Turmeric 500 MG TABS Take 1 tablet by mouth 2 (  two) times daily.   vitamin A 3 MG (10000 UNITS) capsule Take 10,000 Units by mouth daily.   celecoxib  (CELEBREX ) 200 MG capsule Take 200 mg by mouth daily. (Patient not taking: Reported on 08/24/2023)   hydrochlorothiazide  (MICROZIDE ) 12.5 MG capsule TAKE 1 CAPSULE BY MOUTH EVERY DAY (Patient not taking: Reported on 09/14/2023)   [DISCONTINUED] metaxalone  (SKELAXIN ) 400 MG tablet Take 1 tablet (400 mg total) by mouth at bedtime as needed. (Patient not taking: Reported on 08/24/2023)   No facility-administered encounter medications on file as of 09/14/2023.    Allergies (verified) Levaquin   [levofloxacin ]   History: Past Medical History:  Diagnosis Date   Allergy    Aortic valve insufficiency    Arthritis    GERD (gastroesophageal reflux disease)    Hx of varicella    Hyperlipidemia    borderline   Hypertension    Hypothyroidism    Pneumonia 11/2014   Only time took inhalers to treat   RBBB 10/2019   recently seen at [facility] - f/u 6 mons, no treatment   Sacral back pain    Shingles 10/26/2010   Syncope and collapse 10/2019   Unclear etiology - just saw [facility] - f/u 6 months - no treatment   Thyroid  disease    Past Surgical History:  Procedure Laterality Date   CESAREAN SECTION     x1   CHOLECYSTECTOMY     COLONOSCOPY  2011   tics, no polyps - recall 5 yr   ESOPHAGOGASTRODUODENOSCOPY (EGD) WITH PROPOFOL  N/A 12/09/2015   Procedure: ESOPHAGOGASTRODUODENOSCOPY (EGD) WITH PROPOFOL ;  Surgeon: Elspeth Deward Naval, MD;  Location: [facility];  Service: Gastroenterology;  Laterality: N/A;   EXPLORATORY LAPAROTOMY     Gallbladder   KIDNEY STONE SURGERY Left    POLYPECTOMY N/A 12/09/2015   Procedure: POLYPECTOMY;  Surgeon: Elspeth Deward Naval, MD;  Location: [facility];  Service: Gastroenterology;  Laterality: N/A;   TRANSTHORACIC ECHOCARDIOGRAM  10/07/2020   EF 60 to 65%.  Mild concentric LVH.  GRII DD.  Mild to moderate AI.  Normal PAP.  Normal RAP.  Normal RAP.  Stable AI   TUBAL LIGATION     WISDOM TOOTH EXTRACTION     Family History  Problem Relation Age of Onset   Heart disease Mother        stent in 72 s   High blood pressure Mother    High Cholesterol Mother    Transient ischemic attack Mother    Thyroid  disease Mother    Anxiety disorder Mother    Sleep apnea Mother    COPD Father    Heart disease Father        cabg in 52s    Glaucoma Father    Dementia Father    Ulcerative colitis Sister    Ulcerative colitis Brother    Arrhythmia Daughter    Colon cancer Cousin    Colon polyps Neg Hx    Esophageal cancer Neg Hx    Rectal cancer Neg Hx     Stomach cancer Neg Hx    Social History   Socioeconomic History   Marital status: Married    Spouse name: Gladis   Number of children: 1   Years of education: Not on file   Highest education level: Associate degree: academic program  Occupational History   Occupation: Retired  Tobacco Use   Smoking status: Former    Current packs/day: 0.00    Types: Cigarettes    Quit date: 01/04/1982    Years  since quitting: 41.7   Smokeless tobacco: Never   Tobacco comments:    Marijuana occasionally - roughly twice a week.   Vaping Use   Vaping status: Former  Substance and Sexual Activity   Alcohol use: Yes    Alcohol/week: 14.0 standard drinks of alcohol    Types: 14 Standard drinks or equivalent per week    Comment: daily- 2 a night    Drug use: Yes    Types: Marijuana    Comment: Last use 10/15/19- Marijuana Daily   Sexual activity: Yes    Birth control/protection: Post-menopausal  Other Topics Concern   Not on file  Social History Narrative   Married, daughter in [place] - no grandkids- has grandpups   - no pets but dog sits   - Originally from 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      Hobbies: traveling, [place], dogsitting   Social Drivers of Corporate investment banker Strain: Low Risk  (09/10/2023)   Overall Financial Resource Strain (CARDIA)    Difficulty of Paying Living Expenses: Not hard at all  Food Insecurity: No Food Insecurity (09/10/2023)   Hunger Vital Sign    Worried About Running Out of Food in the Last Year: Never true    Ran Out of Food in the Last Year: Never true  Transportation Needs: No Transportation Needs (09/10/2023)   PRAPARE - Administrator, Civil Service (Medical): No    Lack of Transportation (Non-Medical): No  Physical Activity: Sufficiently Active (09/10/2023)   Exercise Vital Sign    Days of Exercise per Week: 6 days    Minutes of Exercise per Session: 30 min   Stress: No Stress Concern Present (09/10/2023)   [employer] of Occupational Health - Occupational Stress Questionnaire    Feeling of Stress: Only a little  Social Connections: Socially Integrated (09/10/2023)   Social Connection and Isolation Panel    Frequency of Communication with Friends and Family: More than three times a week    Frequency of Social Gatherings with Friends and Family: Three times a week    Attends Religious Services: 1 to 4 times per year    Active Member of Clubs or Organizations: Yes    Attends Engineer, structural: More than 4 times per year    Marital Status: Married    Tobacco Counseling Counseling given: Not Answered Tobacco comments: Marijuana occasionally - roughly twice a week.     Clinical Intake:  Pre-visit preparation completed: Yes  Pain : No/denies pain     BMI - recorded: 29.23 Nutritional Status: BMI 25 -29 Overweight Diabetes: No  Lab Results  Component Value Date   HGBA1C 5.6 04/04/2023   HGBA1C 5.3 11/03/2022   HGBA1C 5.5 03/18/2022     How often do you need to have someone help you when you read instructions, pamphlets, or other written materials from your doctor or pharmacy?: 1 - Never  Interpreter Needed?: No  Information entered by :: Ellouise Haws, LPN   Activities of Daily Living     09/10/2023   11:07 AM  In your present state of health, do you have any difficulty performing the following activities:  Hearing? 0  Vision? 0  Difficulty concentrating or making decisions? 0  Walking or climbing stairs? 0  Dressing or bathing? 0  Doing errands, shopping? 0  Preparing Food and eating ? N  Using  the Toilet? N  In the past six months, have you accidently leaked urine? N  Do you have problems with loss of bowel control? N  Managing your Medications? N  Managing your Finances? N  Housekeeping or managing your Housekeeping? N    Patient Care Team: Katrinka Garnette KIDD, MD as PCP - General (Family  Medicine) Anner Alm ORN, MD as PCP - Cardiology (Cardiology)  I have updated your Care Teams any recent Medical Services you may have received from other providers in the past year.     Assessment:   This is a routine wellness examination for Joella.  Hearing/Vision screen Hearing Screening - Comments:: Pt denies any hearing issues  Vision Screening - Comments:: Wears rx glasses - up to date with routine eye exams with Dr elspeth Pinal    Goals Addressed             This Visit's Progress    Patient Stated       Lose weight        Depression Screen     09/14/2023    1:04 PM 07/21/2023    8:59 AM 07/19/2022    8:07 AM 05/04/2021   10:05 AM 02/25/2021   11:06 AM 03/19/2020    2:20 PM 01/07/2020    2:27 PM  PHQ 2/9 Scores  PHQ - 2 Score 0 0 0 0 0 0 0  PHQ- 9 Score 0 0   2      Fall Risk     09/10/2023   11:07 AM 07/15/2022    8:52 AM 05/04/2021   10:05 AM 05/03/2021   10:34 AM 02/25/2021   11:06 AM  Fall Risk   Falls in the past year? 0 0 0 0  0  Number falls in past yr:  0 0 0  0  Injury with Fall?  0 0 0  0  Risk for fall due to : No Fall Risks Impaired balance/gait;Impaired vision Medication side effect  No Fall Risks  Risk for fall due to: Comment  taking classes for balance     Follow up Falls prevention discussed Falls prevention discussed Falls evaluation completed;Education provided;Falls prevention discussed   Falls evaluation completed      Proxy-reported   Data saved with a previous flowsheet row definition    MEDICARE RISK AT HOME:  Medicare Risk at Home Any stairs in or around the home?: (Patient-Rptd) Yes If so, are there any without handrails?: (Patient-Rptd) Yes Home free of loose throw rugs in walkways, pet beds, electrical cords, etc?: (Patient-Rptd) Yes Adequate lighting in your home to reduce risk of falls?: (Patient-Rptd) Yes Life alert?: (Patient-Rptd) No Use of a cane, walker or w/c?: (Patient-Rptd) No Grab bars in the bathroom?: (Patient-Rptd)  Yes Shower chair or bench in shower?: (Patient-Rptd) Yes Elevated toilet seat or a handicapped toilet?: (Patient-Rptd) No  TIMED UP AND GO:  Was the test performed?  No  Cognitive Function: 6CIT completed        09/14/2023    1:06 PM 05/04/2021   10:06 AM  6CIT Screen  What Year? 0 points 0 points  What month? 0 points 0 points  What time? 0 points 0 points  Count back from 20 0 points 0 points  Months in reverse 0 points 0 points  Repeat phrase 0 points 0 points  Total Score 0 points 0 points    Immunizations Immunization History  Administered Date(s) Administered   Fluad  Quad(high Dose 65+) 08/30/2018, 10/15/2020, 10/07/2021  Fluad  Trivalent(High Dose 65+) 09/15/2022   Hep A / Hep B 02/28/2009, 03/28/2009   Hepatitis A 08/29/2009   Hepatitis B 07/31/2009   INFLUENZA, HIGH DOSE SEASONAL PF 10/31/2017, 10/07/2021, 09/14/2023   Influenza-Unspecified 10/02/2019, 10/02/2019, 10/15/2020, 10/07/2021   Meningococcal polysaccharide vaccine (MPSV4) 02/28/2009   Moderna SARS-COV2 Booster Vaccination 10/14/2021   PFIZER(Purple Top)SARS-COV-2 Vaccination 01/26/2019, 02/16/2019, 10/02/2019   Pfizer Covid-19 Vaccine Bivalent Booster 45yrs & up 10/15/2020   Pfizer(Comirnaty )Fall Seasonal Vaccine 12 years and older 10/14/2021, 09/15/2022   Pneumococcal Conjugate Pcv21, Polysaccharide Crm197 Conjugaf 06/29/2023   Pneumococcal Conjugate-13 08/02/2018   Pneumococcal Polysaccharide-23 08/21/2019   Td 01/04/2002, 02/28/2009   Tdap 11/10/2021   Zoster Recombinant(Shingrix ) 03/25/2021, 07/14/2021    Screening Tests Health Maintenance  Topic Date Due   COVID-19 Vaccine (6 - Pfizer risk 2024-25 season) 09/05/2023   MAMMOGRAM  02/28/2024   Medicare Annual Wellness (AWV)  09/13/2024   Colonoscopy  10/28/2029   DTaP/Tdap/Td (4 - Td or Tdap) 11/11/2031   Pneumococcal Vaccine: 50+ Years  Completed   Influenza Vaccine  Completed   DEXA SCAN  Completed   Hepatitis C Screening  Completed    Zoster Vaccines- Shingrix   Completed   HPV VACCINES  Aged Out   Meningococcal B Vaccine  Aged Out   Hepatitis B Vaccines 19-59 Average Risk  Discontinued    Health Maintenance Items Addressed: See Nurse Notes at the end of this note  Additional Screening:  Vision Screening: Recommended annual ophthalmology exams for early detection of glaucoma and other disorders of the eye. Is the patient up to date with their annual eye exam?  Yes  Who is the provider or what is the name of the office in which the patient attends annual eye exams? Dr Elspeth Pinal   Dental Screening: Recommended annual dental exams for proper oral hygiene  Community Resource Referral / Chronic Care Management: CRR required this visit?  No   CCM required this visit?  No   Plan:    I have personally reviewed and noted the following in the patient's chart:   Medical and social history Use of alcohol, tobacco or illicit drugs  Current medications and supplements including opioid prescriptions. Patient is not currently taking opioid prescriptions. Functional ability and status Nutritional status Physical activity Advanced directives List of other physicians Hospitalizations, surgeries, and ER visits in previous 12 months Vitals Screenings to include cognitive, depression, and falls Referrals and appointments  In addition, I have reviewed and discussed with patient certain preventive protocols, quality metrics, and best practice recommendations. A written personalized care plan for preventive services as well as general preventive health recommendations were provided to patient.   Ellouise VEAR Haws, LPN   0/[id number]/7974   After Visit Summary: (MyChart) Due to this being a telephonic visit, the after visit summary with patients personalized plan was offered to patient via MyChart   Notes: Nothing significant to report at this time.

## 2023-09-15 ENCOUNTER — Other Ambulatory Visit (HOSPITAL_BASED_OUTPATIENT_CLINIC_OR_DEPARTMENT_OTHER): Payer: Self-pay

## 2023-09-15 ENCOUNTER — Other Ambulatory Visit: Payer: Self-pay | Admitting: Family Medicine

## 2023-09-15 MED ORDER — COVID-19 MRNA VAC-TRIS(PFIZER) 30 MCG/0.3ML IM SUSY
0.3000 mL | PREFILLED_SYRINGE | Freq: Once | INTRAMUSCULAR | 0 refills | Status: AC
  Filled 2023-09-15: qty 0.3, 1d supply, fill #0

## 2023-09-15 NOTE — Progress Notes (Signed)
 Jillian Escobar [address] [place] [address] Phone: [phone] Subjective:   Jillian Escobar, am serving as a scribe for Dr. Arthea Claudene.  I'm seeing this patient by the request  of:  Katrinka Garnette KIDD, MD  CC: Back and neck pain follow-up  YEP:Dlagzrupcz  Jillian Escobar is a 73 y.o. female coming in with complaint of back and neck pain. OMT 07/19/2023. Patient states that she has been feeling better. Wakes up stiff in the thoracolumbar area. Leg pain is better with gabapentin . Pain will return in leg intermittently. Patient is happy with where is at.   Medications patient has been prescribed: Gabapentin   Taking:     Patient did have x-rays taken of the lumbar spine showing spondylosis of the lumbar spine and some aortic atherosclerosis.  Seems to be severe at L4-L5 though with the arthritis.    Reviewed prior external information including notes and imaging from previsou exam, outside providers and external EMR if available.   As well as notes that were available from care everywhere and other healthcare systems.  Past medical history, social, surgical and family history all reviewed in electronic medical record.  No pertanent information unless stated regarding to the chief complaint.   Past Medical History:  Diagnosis Date   Allergy    Aortic valve insufficiency    Arthritis    GERD (gastroesophageal reflux disease)    Hx of varicella    Hyperlipidemia    borderline   Hypertension    Hypothyroidism    Pneumonia 11/2014   Only time took inhalers to treat   RBBB 10/2019   [facility]ly seen at [facility] - f/u 6 mons, no treatment   Sacral back pain    Shingles 10/26/2010   Syncope and collapse 10/2019   Unclear etiology - just saw MD - f/u 6 months - no treatment   Thyroid  disease     Allergies  Allergen Reactions   Levaquin  [Levofloxacin ]     See notes  September 2019  Palpitations irritability sleep issues       Review of  Systems:  No headache, visual changes, nausea, vomiting, diarrhea, constipation, dizziness, abdominal pain, skin rash, fevers, chills, night sweats, weight loss, swollen lymph nodes, body aches, joint swelling, chest pain, shortness of breath, mood changes. POSITIVE muscle aches  Objective  Blood pressure 120/74, height 5' 3 (1.6 m), weight 169 lb (76.7 kg).   General: No apparent distress alert and oriented x3 mood and affect normal, dressed appropriately.  HEENT: Pupils equal, extraocular movements intact  Respiratory: Patient's speak in full sentences and does not appear short of breath  Cardiovascular: No lower extremity edema, non tender, no erythema  Gait relatively normal MSK:  Back does have some increasing kyphosis noted in the upper back.  Tightness noted in the thoracolumbar juncture.  Right greater than left.  Patient does have tightness with FABER right greater than left as well.  Osteopathic findings  C5 flexed rotated and side bent right C7 flexed rotated and side bent left T3 extended rotated and side bent right inhaled rib T9 extended rotated and side bent left L2 flexed rotated and side bent right Sacrum right on right       Assessment and Plan:  Degenerative disc disease, lumbar Known degenerative disc disease.  Patient may be traveling in the next months here in the emergency room for further evaluation.  Discussed if any other type of medication changes will be necessary.  Discussed  icing regimen and home exercises, discussed which activities to do and which ones to avoid.  Increase activity slowly.  Follow-up again 6 to 12 weeks.    Nonallopathic problems  Decision today to treat with OMT was based on Physical Exam  After verbal consent patient was treated with HVLA, ME, FPR techniques in cervical, rib, thoracic, lumbar, and sacral  areas  Patient tolerated the procedure well with improvement in symptoms  Patient given exercises, stretches and lifestyle  modifications  See medications in patient instructions if given  Patient will follow up in 4-8 weeks  We discussed gabapentin  200 mg at night.   The above documentation has been reviewed and is accurate and complete Jillian Burnsworth M Johnny Gorter, DO         Note: This dictation was prepared with Dragon dictation along with smaller phrase technology. Any transcriptional errors that result from this process are unintentional.

## 2023-09-19 ENCOUNTER — Other Ambulatory Visit (HOSPITAL_BASED_OUTPATIENT_CLINIC_OR_DEPARTMENT_OTHER): Payer: Self-pay

## 2023-09-19 MED ORDER — COMIRNATY 30 MCG/0.3ML IM SUSY
0.3000 mL | PREFILLED_SYRINGE | Freq: Once | INTRAMUSCULAR | 0 refills | Status: AC
  Filled 2023-09-19: qty 0.3, 1d supply, fill #0

## 2023-09-20 ENCOUNTER — Encounter: Payer: Self-pay | Admitting: Family Medicine

## 2023-09-20 ENCOUNTER — Ambulatory Visit (INDEPENDENT_AMBULATORY_CARE_PROVIDER_SITE_OTHER): Admitting: Family Medicine

## 2023-09-20 VITALS — BP 120/74 | Ht 63.0 in | Wt 169.0 lb

## 2023-09-20 DIAGNOSIS — M51362 Other intervertebral disc degeneration, lumbar region with discogenic back pain and lower extremity pain: Secondary | ICD-10-CM | POA: Diagnosis not present

## 2023-09-20 DIAGNOSIS — M9902 Segmental and somatic dysfunction of thoracic region: Secondary | ICD-10-CM

## 2023-09-20 DIAGNOSIS — M9903 Segmental and somatic dysfunction of lumbar region: Secondary | ICD-10-CM

## 2023-09-20 DIAGNOSIS — M9901 Segmental and somatic dysfunction of cervical region: Secondary | ICD-10-CM

## 2023-09-20 DIAGNOSIS — M9908 Segmental and somatic dysfunction of rib cage: Secondary | ICD-10-CM

## 2023-09-20 DIAGNOSIS — M9904 Segmental and somatic dysfunction of sacral region: Secondary | ICD-10-CM

## 2023-09-20 NOTE — Assessment & Plan Note (Signed)
 Known degenerative disc disease.  Patient may be traveling in the next months here in the emergency room for further evaluation.  Discussed if any other type of medication changes will be necessary.  Discussed icing regimen and home exercises, discussed which activities to do and which ones to avoid.  Increase activity slowly.  Follow-up again 6 to 12 weeks.

## 2023-10-02 ENCOUNTER — Encounter

## 2023-10-02 DIAGNOSIS — G4719 Other hypersomnia: Secondary | ICD-10-CM

## 2023-10-07 DIAGNOSIS — G4733 Obstructive sleep apnea (adult) (pediatric): Secondary | ICD-10-CM | POA: Diagnosis not present

## 2023-10-14 ENCOUNTER — Encounter: Payer: Self-pay | Admitting: Family Medicine

## 2023-10-14 MED ORDER — GABAPENTIN 100 MG PO CAPS: 200.0000 mg | ORAL_CAPSULE | Freq: Every day | ORAL | 0 refills | Status: DC

## 2023-10-19 ENCOUNTER — Other Ambulatory Visit: Payer: Self-pay | Admitting: Family Medicine

## 2023-10-20 ENCOUNTER — Other Ambulatory Visit: Payer: Self-pay

## 2023-10-20 ENCOUNTER — Other Ambulatory Visit: Payer: Self-pay | Admitting: Family Medicine

## 2023-10-20 ENCOUNTER — Telehealth: Payer: Self-pay

## 2023-10-20 MED ORDER — GABAPENTIN 100 MG PO CAPS: 200.0000 mg | ORAL_CAPSULE | Freq: Every day | ORAL | 0 refills | Status: DC

## 2023-10-20 NOTE — Telephone Encounter (Signed)
 Copied from CRM #[id number]. Topic: Clinical - Lab/Test Results >> Oct 20, 2023  9:16 AM [name] PARAS wrote: Reason for CRM:   Pt is contacting clinic regarding her HST results. Study was performed 9/28 and results were released to pt on 9/28.   Requested call back to review results  CB#  [phone], please detailed message if no answer  Please advise on home sleep test results

## 2023-10-21 ENCOUNTER — Telehealth: Payer: Self-pay

## 2023-10-21 DIAGNOSIS — G4733 Obstructive sleep apnea (adult) (pediatric): Secondary | ICD-10-CM

## 2023-10-21 NOTE — Telephone Encounter (Signed)
ATC x1.  LVM to return call. 

## 2023-10-21 NOTE — Telephone Encounter (Signed)
 The results of the sleep study were never sent to me.  I was not aware the results were back or the test was completed.  The sleep test does revealed mild sleep apnea.  I would recommend an auto titrating CPAP with settings of 5 cm of water to 15 cm of water.  If she is amenable to using CPAP, please place this order.  Thank you.

## 2023-10-21 NOTE — Telephone Encounter (Signed)
 Copied from CRM #[id number]. Topic: Clinical - Lab/Test Results >> Oct 20, 2023  9:16 AM [name] PARAS wrote: Reason for CRM:   Pt is contacting clinic regarding her HST results. Study was performed 9/28 and results were released to pt on 9/28.   Requested call back to review results  CB#  [phone], please detailed message if no answer >> Oct 21, 2023 12:04 PM Rozanna G wrote: Pt returned call back advised her of the results and about the cpap, advsied her that Clarence or one of the other nurses will reach her back   I called and spoke to pt. Pt informed of Dr Hunsucker's note and verbalized understanding. Pt agreed to starting CPAP and I will send this in to [facility]. NFN

## 2023-10-24 NOTE — Telephone Encounter (Signed)
 Called and spoke with patient,order has been placed,gave patient [facility] number for updates.NFN patient will call back on once she gets there machine to schedule

## 2023-11-07 NOTE — Progress Notes (Unsigned)
 [name] [address] [place] [address] Phone: [phone] Subjective:   ISusannah Escobar, am serving as a scribe for Dr. Arthea Claudene.  I'm seeing this patient by the request  of:  Jillian Garnette KIDD, MD  CC: Back and neck pain follow-up  YEP:Dlagzrupcz  Jillian Escobar is a 73 y.o. female coming in with complaint of back and neck pain. OMT 09/20/2023. Patient states mid back has been hurting. Had a massage and felt bad after. Just tight.  Medications patient has been prescribed: Gabapentin   Taking:     Patient did have a sleep study previously and did fail it and does need a CPAP.  Expand All Collapse All  [name] [address] [place] [address] Phone: [phone] Subjective:   [name], am serving as a scribe for Dr. Arthea Claudene.   I'm seeing this patient by the request  of:  Jillian Garnette KIDD, MD   CC: Back and neck pain follow-up   HPI: Subjective Jillian Escobar is a 73 y.o. female coming in with complaint of back and neck pain. OMT 07/19/2023. Patient states that she has been feeling better. Wakes up stiff in the thoracolumbar area. Leg pain is better with gabapentin . Pain will return in leg intermittently. Patient is happy with where is at.    Medications patient has been prescribed: Gabapentin    Taking:       Patient did have x-rays taken of the lumbar spine showing spondylosis of the lumbar spine and some aortic atherosclerosis.  Seems to be severe at L4-L5 though with the arthritis.      Reviewed prior external information including notes and imaging from previsou exam, outside providers and external EMR if available.   As well as notes that were available from care everywhere and other healthcare systems.  Past medical history, social, surgical and family history all reviewed in electronic medical record.  No pertanent information unless stated regarding to the  chief complaint.   Past Medical History:  Diagnosis Date   Allergy    Aortic valve insufficiency    Arthritis    GERD (gastroesophageal reflux disease)    Hx of varicella    Hyperlipidemia    borderline   Hypertension    Hypothyroidism    Pneumonia 11/2014   Only time took inhalers to treat   RBBB 10/2019   recently seen at [facility] - f/u 6 mons, no treatment   Sacral back pain    Shingles 10/26/2010   Syncope and collapse 10/2019   Unclear etiology - just saw MD - f/u 6 months - no treatment   Thyroid  disease     Allergies  Allergen Reactions   Levaquin  [Levofloxacin ]     See notes  September 2019  Palpitations irritability sleep issues       Review of Systems:  No headache, visual changes, nausea, vomiting, diarrhea, constipation, dizziness, abdominal pain, skin rash, fevers, chills, night sweats, weight loss, swollen lymph nodes, body aches, joint swelling, chest pain, shortness of breath, mood changes. POSITIVE muscle aches  Objective  Blood pressure 110/76, pulse 67, height 5' 3 (1.6 m), weight 171 lb (77.6 kg), SpO2 99%.   General: No apparent distress alert and oriented x3 mood and affect normal, dressed appropriately.  HEENT: Pupils equal, extraocular movements intact  Respiratory: Patient's speak in full sentences and does not appear short of breath  Cardiovascular: No lower extremity edema, non tender,  no erythema  Gait MSK:  Back does have some loss lordosis noted.  Some tenderness to palpation in the paraspinal musculature.  Patient does have the increasing kyphosis noted on the left side.  Osteopathic findings  C2 flexed rotated and side bent right C5 flexed rotated and side bent left T3 extended rotated and side bent right inhaled rib T5 extended rotated and side bent left L1 flexed rotated and side bent right Sacrum right on right       Assessment and Plan:  Left lumbar radiculopathy Likely no radicular symptoms at this time.  Will continue to  monitor though.  Patient is trying to be active.  Will see how patient responds well.  There was significantly more tightness noted in the thoracolumbar juncture.  Will continue to monitor otherwise.  Discussed icing regimen and home exercises, follow-up again in 6 to 12 weeks    Nonallopathic problems  Decision today to treat with OMT was based on Physical Exam  After verbal consent patient was treated with HVLA, ME, FPR techniques in cervical, rib, thoracic, lumbar, and sacral  areas  Patient tolerated the procedure well with improvement in symptoms  Patient given exercises, stretches and lifestyle modifications  See medications in patient instructions if given  Patient will follow up in 4-8 weeks     The above documentation has been reviewed and is accurate and complete Marvelous Woolford M Maryhelen Lindler, DO         Note: This dictation was prepared with Dragon dictation along with smaller phrase technology. Any transcriptional errors that result from this process are unintentional.

## 2023-11-08 ENCOUNTER — Encounter: Payer: Self-pay | Admitting: Family Medicine

## 2023-11-08 ENCOUNTER — Ambulatory Visit: Admitting: Family Medicine

## 2023-11-08 VITALS — BP 110/76 | HR 67 | Ht 63.0 in | Wt 171.0 lb

## 2023-11-08 DIAGNOSIS — M9901 Segmental and somatic dysfunction of cervical region: Secondary | ICD-10-CM

## 2023-11-08 DIAGNOSIS — M9904 Segmental and somatic dysfunction of sacral region: Secondary | ICD-10-CM | POA: Diagnosis not present

## 2023-11-08 DIAGNOSIS — M5416 Radiculopathy, lumbar region: Secondary | ICD-10-CM

## 2023-11-08 DIAGNOSIS — M9902 Segmental and somatic dysfunction of thoracic region: Secondary | ICD-10-CM

## 2023-11-08 DIAGNOSIS — M9908 Segmental and somatic dysfunction of rib cage: Secondary | ICD-10-CM | POA: Diagnosis not present

## 2023-11-08 DIAGNOSIS — M9903 Segmental and somatic dysfunction of lumbar region: Secondary | ICD-10-CM | POA: Diagnosis not present

## 2023-11-08 NOTE — Assessment & Plan Note (Signed)
 Likely no radicular symptoms at this time.  Will continue to monitor though.  Patient is trying to be active.  Will see how patient responds well.  There was significantly more tightness noted in the thoracolumbar juncture.  Will continue to monitor otherwise.  Discussed icing regimen and home exercises, follow-up again in 6 to 12 weeks

## 2023-11-08 NOTE — Patient Instructions (Signed)
 Good to see you! Worked on mid back a Architectural Technologist after activity See you again in 2-3 months

## 2023-12-06 ENCOUNTER — Encounter: Payer: Self-pay | Admitting: Family Medicine

## 2023-12-06 ENCOUNTER — Ambulatory Visit: Admitting: Family Medicine

## 2023-12-06 ENCOUNTER — Ambulatory Visit: Payer: Self-pay | Admitting: Family Medicine

## 2023-12-06 ENCOUNTER — Ambulatory Visit

## 2023-12-06 VITALS — BP 128/70 | HR 63 | Temp 98.1°F | Ht 63.0 in | Wt 171.6 lb

## 2023-12-06 DIAGNOSIS — E785 Hyperlipidemia, unspecified: Secondary | ICD-10-CM | POA: Diagnosis not present

## 2023-12-06 DIAGNOSIS — I10 Essential (primary) hypertension: Secondary | ICD-10-CM

## 2023-12-06 DIAGNOSIS — E88819 Insulin resistance, unspecified: Secondary | ICD-10-CM

## 2023-12-06 DIAGNOSIS — R739 Hyperglycemia, unspecified: Secondary | ICD-10-CM

## 2023-12-06 DIAGNOSIS — M546 Pain in thoracic spine: Secondary | ICD-10-CM

## 2023-12-06 DIAGNOSIS — E559 Vitamin D deficiency, unspecified: Secondary | ICD-10-CM

## 2023-12-06 DIAGNOSIS — R051 Acute cough: Secondary | ICD-10-CM

## 2023-12-06 DIAGNOSIS — E039 Hypothyroidism, unspecified: Secondary | ICD-10-CM | POA: Diagnosis not present

## 2023-12-06 LAB — COMPREHENSIVE METABOLIC PANEL WITH GFR
ALT: 16 U/L (ref 0–35)
AST: 19 U/L (ref 0–37)
Albumin: 4.4 g/dL (ref 3.5–5.2)
Alkaline Phosphatase: 60 U/L (ref 39–117)
BUN: 20 mg/dL (ref 6–23)
CO2: 28 meq/L (ref 19–32)
Calcium: 9.9 mg/dL (ref 8.4–10.5)
Chloride: 106 meq/L (ref 96–112)
Creatinine, Ser: 0.77 mg/dL (ref 0.40–1.20)
GFR: 77.73 mL/min (ref >60.00)
Glucose, Bld: 96 mg/dL (ref 70–99)
Potassium: 4 meq/L (ref 3.5–5.1)
Sodium: 140 meq/L (ref 135–145)
Total Bilirubin: 1 mg/dL (ref 0.2–1.2)
Total Protein: 7 g/dL (ref 6.0–8.3)

## 2023-12-06 LAB — T4, FREE: Free T4: 0.85 ng/dL (ref 0.60–1.60)

## 2023-12-06 LAB — CBC WITH DIFFERENTIAL/PLATELET
Basophils Absolute: 0.1 K/uL (ref 0.0–0.1)
Basophils Relative: 1.1 % (ref 0.0–3.0)
Eosinophils Absolute: 0.1 K/uL (ref 0.0–0.7)
Eosinophils Relative: 2.4 % (ref 0.0–5.0)
HCT: 41.3 % (ref 36.0–46.0)
Hemoglobin: 14.3 g/dL (ref 12.0–15.0)
Lymphocytes Relative: 34.2 % (ref 12.0–46.0)
Lymphs Abs: 1.7 K/uL (ref 0.7–4.0)
MCHC: 34.6 g/dL (ref 30.0–36.0)
MCV: 89.4 fl (ref 78.0–100.0)
Monocytes Absolute: 0.5 K/uL (ref 0.1–1.0)
Monocytes Relative: 10 % (ref 3.0–12.0)
Neutro Abs: 2.6 K/uL (ref 1.4–7.7)
Neutrophils Relative %: 52.3 % (ref 43.0–77.0)
Platelets: 283 K/uL (ref 150.0–400.0)
RBC: 4.62 Mil/uL (ref 3.87–5.11)
RDW: 13.1 % (ref 11.5–15.5)
WBC: 4.9 K/uL (ref 4.0–10.5)

## 2023-12-06 LAB — VITAMIN D 25 HYDROXY (VIT D DEFICIENCY, FRACTURES): VITD: 42.31 ng/mL (ref 30.00–100.00)

## 2023-12-06 LAB — HEMOGLOBIN A1C: Hgb A1c MFr Bld: 5.2 % (ref 4.6–6.5)

## 2023-12-06 LAB — TSH: TSH: 2.86 u[IU]/mL (ref 0.35–5.50)

## 2023-12-06 MED ORDER — TIZANIDINE HCL 2 MG PO TABS: 2.0000 mg | ORAL_TABLET | Freq: Every evening | ORAL | 0 refills | Status: DC | PRN

## 2023-12-06 NOTE — Progress Notes (Signed)
 Phone [phone] In person visit   Subjective:   SCOTLYNN NOYES is a 73 y.o. year old very pleasant female patient who presents for/with See problem oriented charting Chief Complaint  Patient presents with   Medical Management of Chronic Issues    6 month follow up;    Back Pain    Worsening over the past 2 weeks; has been bad enough to take her breath away, unable to move sometimes; has been trying stretches and yoga with no relief; has been taking 600 ibuprofen  on and off 2 times daily;     Weight Gain    Unable to loose weight and steady gaining;     Past Medical History-  Patient Active Problem List   Diagnosis Date Noted   Hyperlipidemia, unspecified 05/08/2021    Priority: Medium    Aortic valve insufficiency 05/01/2020    Priority: Medium    Symptomatic PVCs 05/14/2019    Priority: Medium    Essential hypertension 01/11/2015    Priority: Medium    Fasting hyperglycemia 03/04/2014    Priority: Medium    Hepatic steatosis mild 11/23/2013    Priority: Medium    BPPV (benign paroxysmal positional vertigo) 11/10/2012    Priority: Medium    Family history of Alzheimer's disease 08/10/2011    Priority: Medium    Hypothyroidism 01/22/2008    Priority: Medium    GERD 01/22/2008    Priority: Medium    Pes anserine bursitis 03/11/2021    Priority: Low   Degenerative disc disease, lumbar 02/11/2021    Priority: Low   Somatic dysfunction of spine, sacral 02/11/2021    Priority: Low   Gastric polyps     Priority: Low   Family history of premature CAD 01/09/2015    Priority: Low   Exertional shortness of breath 12/04/2013    Priority: Low   Perimenopausal vasomotor symptoms 08/10/2011    Priority: Low   Post-nasal drainage 07/30/2011    Priority: Low   Cough, persistent recurrent 07/30/2011    Priority: Low   Postmenopausal HRT (hormone replacement therapy) 06/28/2010    Priority: Low   PLANTAR FASCIITIS, RIGHT 01/02/2008    Priority: Low   Left lumbar  radiculopathy 07/19/2023   Closed fracture of fourth toe of left foot 02/16/2023   AC (acromioclavicular) arthritis 10/27/2022   Insulin  resistance 04/12/2022   Vitamin D  deficiency 04/12/2022   Class 1 obesity with serious comorbidity and body mass index (BMI) of 30.0 to 30.9 in adult 04/12/2022   Arthritis of midfoot 07/23/2021    Medications- reviewed and updated Current Outpatient Medications  Medication Sig Dispense Refill   atorvastatin  (LIPITOR) 20 MG tablet Take 1 tablet (20 mg total) by mouth daily. 90 tablet 3   calcium  carbonate (OS-CAL) 600 MG TABS tablet      celecoxib  (CELEBREX ) 200 MG capsule Take 200 mg by mouth daily.     ELDERBERRY PO daily.      famotidine  (PEPCID ) 20 MG tablet Take 20 mg by mouth as needed for heartburn or indigestion.     gabapentin  (NEURONTIN ) 100 MG capsule Take 2 capsules (200 mg total) by mouth at bedtime. 180 capsule 0   KRILL OIL PO Take by mouth in the morning and at bedtime.     levothyroxine  (SYNTHROID ) 50 MCG tablet TAKE 1 TABLET BY MOUTH EVERY DAY 90 tablet 3   losartan  (COZAAR ) 25 MG tablet TAKE 1/2 TABLET BY MOUTH EVERY DAY 45 tablet 3   Misc Natural Products (OSTEO BI-FLEX/5-LOXIN  ADVANCED PO) Take by mouth 2 (two) times daily at 8 am and 10 pm.     Multiple Vitamin (MULTIVITAMIN WITH MINERALS) TABS tablet Take 1 tablet by mouth daily.      TART CHERRY PO Take 1 tablet by mouth daily.     tiZANidine (ZANAFLEX) 2 MG tablet Take 1 tablet (2 mg total) by mouth at bedtime as needed for muscle spasms (do ont drive for 8 hours after taking). 15 tablet 0   Turmeric 500 MG TABS Take 1 tablet by mouth 2 (two) times daily.     vitamin A 3 MG (10000 UNITS) capsule Take 10,000 Units by mouth daily.     No current facility-administered medications for this visit.     Objective:  BP 128/70 (BP Location: Left Arm, Patient Position: Sitting, Cuff Size: Normal)   Pulse 63   Temp 98.1 F (36.7 C) (Temporal)   Ht 5' 3 (1.6 m)   Wt 171 lb 9.6 oz  (77.8 kg)   LMP  (LMP Unknown)   SpO2 98%   BMI 30.40 kg/m  Gen: NAD, resting comfortably CV: RRR no murmurs rubs or gallops Lungs: CTAB no crackles, wheeze, rhonchi  MSK: No midline tenderness through cervical or thoracic spine.  Muscle spasm in thoracic spine tender to palpation with temporary relief with massage Ext: Minimal edema Skin: warm, dry Neuro: Motor 5 strength in upper and lower extremities    Assessment and Plan   # thoracic Back pain/cough/congestion S: Patient reports worsening back pain for last 2 weeks in mid back .  Sometimes pain severe enough to take her breath away.  Feels unable to move at times.  Has tried stretches and yoga without relief.  Has used ibuprofen  600 mg on and off up to twice daily. Has not tried her Celebrex  yet. No lingering muscle relaxants. Has to sit on side of bed to even get moving but feels stuck/spasming. Hard to even go down the stairs in the morning. Feels more winded as well. Also waking up with cough- reassuring x-ray on July 18th as well. Had a massage and that helped som ebut as soon as gets up grabs her again. Standing up is worse. At present 3/10 once up and moving -her shortness of breath is not new- see note 07/21/23 and saw cardiology- they did not think heart related  Most recent issues with Dr. Claudene had been more low back and into the  left leg. Mild issues with that this morning despite her gabapentin . Most recent imaging in July 2025 with spondylosis of low back but no recent thoracic back imaging.    ROS-No saddle anesthesia, bladder incontinence, fecal incontinence, weakness in extremity, new numbness or tingling in extremity. History negative for trauma, history of cancer, fever, chills, unintentional weight loss, recent bacterial infection, recent IV drug use, HIV, pain worse at night or while supine (better in those positions  A/P: Patient with 2 weeks of significant thoracic back pain.  Very tight on exam and massage is  helpful as well as ibuprofen  so this is likely muscular.  She would like to talk to Dr. Claudene if issues persist.  We also opted to try muscle relaxant before bed and she could try her Celebrex  instead of ibuprofen -better for GI risks  Her bigger concern is the cough and congestion-she has had a pneumonia in the past that primarily presented with fatigue and she wants to make sure there is no pneumonia or lung infection.  This could be  a mild viral illness or allergies but reasonable to get x-ray-also look for widened mediastinum though very low likelihood of thoracic aneurysm and I did not think imaging was necessary given muscular pattern of pain  #healthy weight to wellness- working on weight loss- last visit in may. Has been craving carbohydrates. Felt burned out with over a year in program. Eating healthier. Weight up slightly. Trying to still moderate food choices.  -still walking and some classes at [facility] including pilates - tried to encourage her today to keep moving forward- she's done a great job getting down from 189  #hypertension and PVCs-sees Dr. Anner S: medication:losartan  12.5 mg (started half tablet 11/09/2021) -orthostatic on hydrochlorothiazide   -PVCs typically controlled without beta-blockers-reduced triggers such as caffeine and sweets and staying well-hydrated BP Readings from Last 3 Encounters:  12/06/23 128/70  11/08/23 110/76  09/20/23 120/74  A/P: blood pressure well controlled continue current medications  - ok off of hydrochlorothiazide   Palpitations ok lately thaknfully   #hyperlipidemia- family history CAD S: Medication: Atorvastatin  20 mg daily, Krill oil  Lab Results  Component Value Date   CHOL 126 11/03/2022   HDL 54 11/03/2022   LDLCALC 61 11/03/2022   LDLDIRECT 158.4 08/14/2012   TRIG 45 11/03/2022   CHOLHDL 2.8 07/14/2021  A/P: hopefully stable- update lipid panel today. Continue current meds for now   #hypothyroidism S: compliant On thyroid   medication-levothyroxine  50 mcg A/P:hopefully stable- update TSH,t4 today. Continue current meds for now     # Hyperglycemia/insulin  resistance/prediabetes-fasting blood sugars elevated in the past but A1c's have been okay S:  Medication: none- off metformin  Lab Results  Component Value Date   HGBA1C 5.6 04/04/2023   HGBA1C 5.3 11/03/2022   HGBA1C 5.5 03/18/2022  A/P: update a1c but has been ok lately- continue to monitor    # GERD S:Medication: pepcid  once or twice daily- very rare  -Nexium  20 mg in past A/P: doing well continue current medications     #Fatty liver-LFTs ok lately recheck today  Lab Results  Component Value Date   ALT 16 07/21/2023   AST 18 07/21/2023   ALKPHOS 56 07/21/2023   BILITOT 1.1 07/21/2023      #Vitamin D  deficiency S: Medication: off lately Last vitamin D  Lab Results  Component Value Date   VD25OH 75.4 11/03/2022  A/P: hopefully stable- update vitamin D  today. Continue without meds for now     Recommended follow up: Return in about 6 months (around 06/05/2024) for followup or sooner if needed.Schedule b4 you leave. Future Appointments  Date Time Provider Department Center  01/17/2024 11:15 AM Claudene Arthea HERO, DO [facility] None  01/24/2024  2:00 PM Hunsucker, Donnice SAUNDERS, MD [facility] [address]  09/19/2024  1:00 PM [facility] ANNUAL WELLNESS VISIT 1 [facility]    Lab/Order associations:   ICD-10-CM   1. Acute cough  R05.1 DG Chest 2 View    2. Essential hypertension  I10 Comprehensive metabolic panel    CBC with Differential/Platelet    3. Hyperlipidemia, unspecified hyperlipidemia type  E78.5 Comprehensive metabolic panel    Lipid Panel With LDL/HDL Ratio    4. Insulin  resistance  E88.819 Hemoglobin A1c    5. Hyperglycemia, unspecified  R73.9 Hemoglobin A1c    6. Hypothyroidism, unspecified type  E03.9 T4, free    TSH    7. Vitamin D  deficiency  E55.9 VITAMIN D  25 Hydroxy (Vit-D Deficiency, Fractures)    8. Acute  bilateral thoracic back pain  M54.6 DG Chest  2 View      Meds ordered this encounter  Medications   tiZANidine (ZANAFLEX) 2 MG tablet    Sig: Take 1 tablet (2 mg total) by mouth at bedtime as needed for muscle spasms (do ont drive for 8 hours after taking).    Dispense:  15 tablet    Refill:  0    Return precautions advised.  Garnette Lukes, MD

## 2023-12-06 NOTE — Patient Instructions (Addendum)
 Please stop by lab before you go If you have mychart- we will send your results within 3 business days of us  receiving them.  If you do not have mychart- we will call you about results within 5 business days of us  receiving them.  *please also note that you will see labs on mychart as soon as they post. I will later go in and write notes on them- will say notes from Dr. Katrinka   Try tizanidine before bed- this feels muscular but with cough and the pain get x-ray to rule out pneumonia as well  Recommended follow up: Return in about 6 months (around 06/05/2024) for followup or sooner if needed.Schedule b4 you leave. New or worsening symptom(s) such as fever, arm or leg weakness, incontinence, worsening pain, worsening shortness of breath seek care immediately

## 2023-12-07 LAB — LIPID PANEL WITH LDL/HDL RATIO
Cholesterol, Total: 140 mg/dL (ref 100–199)
HDL: 55 mg/dL (ref >39)
LDL Chol Calc (NIH): 70 mg/dL (ref 0–99)
LDL/HDL Ratio: 1.3 ratio (ref 0.0–3.2)
Triglycerides: 75 mg/dL (ref 0–149)
VLDL Cholesterol Cal: 15 mg/dL (ref 5–40)

## 2023-12-13 ENCOUNTER — Other Ambulatory Visit: Payer: Self-pay | Admitting: Family Medicine

## 2023-12-13 NOTE — Telephone Encounter (Signed)
 Requesting 90 script. Lat script sent was one week ago.

## 2023-12-15 ENCOUNTER — Encounter: Payer: Self-pay | Admitting: Pulmonary Disease

## 2023-12-15 ENCOUNTER — Ambulatory Visit (INDEPENDENT_AMBULATORY_CARE_PROVIDER_SITE_OTHER): Admitting: Pulmonary Disease

## 2023-12-15 VITALS — BP 153/84 | HR 68 | Temp 98.1°F | Ht 63.0 in | Wt 174.2 lb

## 2023-12-15 DIAGNOSIS — G4733 Obstructive sleep apnea (adult) (pediatric): Secondary | ICD-10-CM

## 2023-12-15 MED ORDER — ALBUTEROL SULFATE HFA 108 (90 BASE) MCG/ACT IN AERS: 2.0000 | INHALATION_SPRAY | Freq: Four times a day (QID) | RESPIRATORY_TRACT | 6 refills | Status: AC | PRN

## 2023-12-15 NOTE — Progress Notes (Signed)
 @Patient  ID: Jillian Escobar, female    DOB: 04/07/1950, 73 y.o.   MRN: [id number]  Chief Complaint  Patient presents with   Obstructive Sleep Apnea    Cpap follow up    Referring provider: Katrinka Garnette MALVA, MD  HPI:   73 y.o. woman whom we are seeing  for follow-up of recently diagnosed OSA.  Most recent PCP note reviewed.  Chief complaint today is dyspnea on exertion.  Sleep study obtained in interim.  Mild OSA.  Placed on CPAP.  Adherence report reviewed.  Using well.  Improved energy, no longer napping not as tired during the day.  Short of breath for the last few weeks.  Started working outside with wood chips, wood pile.  Moldy dusty etc.  Minimal but new cough.  Comes and goes.  Associate with shortness of breath.  Particular exertion.  No prior history of asthma.  HPI at initial visit: Here for evaluation of issues with possible breathing at night.  She is excessively sleepy during the day.  Feels like she could not off.  She does not nap.  Her STOP-BANG score is 4.  High risk.  She was having some heart palpitations in the morning.  Raise concern for possible hypoxemia at night.  Also with increased joint stiffness in the morning.  Lasted few minutes.  Large joints hips shoulders etc.  Has been seen by rheumatology in the past.  Diagnosed with osteoporosis.  Not worried about rheumatologic issues.  We discussed at length role rationale for sleep apnea testing.  The treatment CPAP in the future.  Alternative treatment options such as oral appliance, inspire device.  She expressed understanding.  She is eager to move forward with her evaluation.  Questionaires / Pulmonary Flowsheets:   ACT:      No data to display          MMRC:     No data to display          Epworth:      No data to display          Tests:   FENO:  No results found for: NITRICOXIDE  PFT:    Latest Ref Rng & Units 01/20/2015   11:45 AM  PFT Results  FVC-Pre L 2.88    FVC-Predicted Pre % 88   FVC-Post L 2.99   FVC-Predicted Post % 92   Pre FEV1/FVC % % 76   Post FEV1/FCV % % 77   FEV1-Pre L 2.18   FEV1-Predicted Pre % 87   FEV1-Post L 2.30   DLCO uncorrected ml/min/mmHg 21.72   DLCO UNC% % 89   DLVA Predicted % 97   TLC L 5.13   TLC % Predicted % 101   RV % Predicted % 104   Personally viewed interpreted as normal spirometry, no bronchodilator response, lung volumes within normal notes, DLCO within normal meds.  WALK:      No data to display          Imaging: Personally viewed and as per EMR and  discussion this note DG Chest 2 View Result Date: 12/08/2023 CLINICAL DATA:  Cough EXAM: DG CHEST 2V COMPARISON:  Chest x-ray performed July 22, 2023 in February 05, 2019 FINDINGS: No focal infiltrate or pleural effusion. No pneumothorax. Heart mediastinum are not significantly changed. Degenerative changes are present in the thoracic spine with mild kyphotic architecture and diffuse disc height loss with endplate osteophyte changes. No definite rib fracture. IMPRESSION: 1. No significant  interval change. 2. Multilevel moderate to severe degenerative changes in the thoracic spine. Electronically Signed   By: Maude Naegeli M.D.   On: 12/08/2023 15:25    Lab Results: Personally reviewed CBC    Component Value Date/Time   WBC 4.9 12/06/2023 1115   RBC 4.62 12/06/2023 1115   HGB 14.3 12/06/2023 1115   HGB 14.8 03/18/2022 0847   HCT 41.3 12/06/2023 1115   HCT 43.3 03/18/2022 0847   PLT 283.0 12/06/2023 1115   PLT 300 03/18/2022 0847   MCV 89.4 12/06/2023 1115   MCV 91 03/18/2022 0847   MCH 30.9 07/22/2023 0657   MCHC 34.6 12/06/2023 1115   RDW 13.1 12/06/2023 1115   RDW 12.6 03/18/2022 0847   LYMPHSABS 1.7 12/06/2023 1115   LYMPHSABS 1.5 03/18/2022 0847   MONOABS 0.5 12/06/2023 1115   EOSABS 0.1 12/06/2023 1115   EOSABS 0.2 03/18/2022 0847   BASOSABS 0.1 12/06/2023 1115   BASOSABS 0.1 03/18/2022 0847    BMET    Component Value  Date/Time   NA 140 12/06/2023 1115   NA 140 04/04/2023 1213   K 4.0 12/06/2023 1115   CL 106 12/06/2023 1115   CO2 28 12/06/2023 1115   GLUCOSE 96 12/06/2023 1115   BUN 20 12/06/2023 1115   BUN 22 04/04/2023 1213   CREATININE 0.77 12/06/2023 1115   CALCIUM  9.9 12/06/2023 1115   GFRNONAA >60 07/22/2023 0657   GFRAA 73 04/04/2019 1234    BNP    Component Value Date/Time   BNP 85.3 10/17/2017 0930    ProBNP    Component Value Date/Time   PROBNP 160.0 07/22/2023 0657    Specialty Problems       Pulmonary Problems   Cough, persistent recurrent   Exertional shortness of breath    Allergies  Allergen Reactions   Levaquin  [Levofloxacin ]     See notes  September 2019  Palpitations irritability sleep issues      Immunization History  Administered Date(s) Administered   Fluad  Quad(high Dose 65+) 08/30/2018, 10/15/2020, 10/07/2021   Fluad  Trivalent(High Dose 65+) 09/15/2022   Hep A / Hep B 02/28/2009, 03/28/2009   Hepatitis A 08/29/2009   Hepatitis B 07/31/2009   INFLUENZA, HIGH DOSE SEASONAL PF 10/31/2017, 10/07/2021, 09/14/2023   Influenza-Unspecified 10/02/2019, 10/02/2019, 10/15/2020, 10/07/2021   Meningococcal polysaccharide vaccine (MPSV4) 02/28/2009   Moderna SARS-COV2 Booster Vaccination 10/14/2021   PFIZER(Purple Top)SARS-COV-2 Vaccination 01/26/2019, 02/16/2019, 10/02/2019   Pfizer Covid-19 Vaccine Bivalent Booster 62yrs & up 10/15/2020   Pfizer(Comirnaty )Fall Seasonal Vaccine 12 years and older 10/14/2021, 09/15/2022, 09/19/2023   Pneumococcal Conjugate Pcv21, Polysaccharide Crm197 Conjugaf 06/29/2023   Pneumococcal Conjugate-13 08/02/2018   Pneumococcal Polysaccharide-23 08/21/2019   Td 01/04/2002, 02/28/2009   Tdap 11/10/2021   Zoster Recombinant(Shingrix ) 03/25/2021, 07/14/2021    Past Medical History:  Diagnosis Date   Allergy    Aortic valve insufficiency    Arthritis    GERD (gastroesophageal reflux disease)    Hx of varicella     Hyperlipidemia    borderline   Hypertension    Hypothyroidism    Pneumonia 11/2014   Only time took inhalers to treat   RBBB 10/2019   recently seen at [facility] - f/u 6 mons, no treatment   Sacral back pain    Shingles 10/26/2010   Syncope and collapse 10/2019   Unclear etiology - just saw [facility] - f/u 6 months - no treatment   Thyroid  disease     Tobacco History: Social History   Tobacco Use  Smoking Status Former   Current packs/day: 0.00   Types: Cigarettes   Quit date: 01/04/1982   Years since quitting: 41.9  Smokeless Tobacco Never  Tobacco Comments   Marijuana occasionally - roughly twice a week.    Counseling given: Not Answered Tobacco comments: Marijuana occasionally - roughly twice a week.    Continue to not smoke  Outpatient Encounter Medications as of 12/15/2023  Medication Sig   albuterol  (VENTOLIN  HFA) 108 (90 Base) MCG/ACT inhaler Inhale 2 puffs into the lungs every 6 (six) hours as needed for wheezing or shortness of breath.   atorvastatin  (LIPITOR) 20 MG tablet Take 1 tablet (20 mg total) by mouth daily.   calcium  carbonate (OS-CAL) 600 MG TABS tablet    ELDERBERRY PO daily.    famotidine  (PEPCID ) 20 MG tablet Take 20 mg by mouth as needed for heartburn or indigestion.   gabapentin  (NEURONTIN ) 100 MG capsule Take 2 capsules (200 mg total) by mouth at bedtime.   KRILL OIL PO Take by mouth in the morning and at bedtime.   levothyroxine  (SYNTHROID ) 50 MCG tablet TAKE 1 TABLET BY MOUTH EVERY DAY   losartan  (COZAAR ) 25 MG tablet TAKE 1/2 TABLET BY MOUTH EVERY DAY   Misc Natural Products (OSTEO BI-FLEX/5-LOXIN ADVANCED PO) Take by mouth 2 (two) times daily at 8 am and 10 pm.   Multiple Vitamin (MULTIVITAMIN WITH MINERALS) TABS tablet Take 1 tablet by mouth daily.    TART CHERRY PO Take 1 tablet by mouth daily.   tiZANidine  (ZANAFLEX ) 2 MG tablet TAKE 1 TABLET (2 MG TOTAL) BY MOUTH AT BEDTIME AS NEEDED FOR MUSCLE SPASMS (DO ONT DRIVE FOR 8 HOURS AFTER TAKING).    Turmeric 500 MG TABS Take 1 tablet by mouth 2 (two) times daily.   vitamin A 3 MG (10000 UNITS) capsule Take 10,000 Units by mouth daily.   celecoxib  (CELEBREX ) 200 MG capsule Take 200 mg by mouth daily. (Patient not taking: Reported on 12/15/2023)   No facility-administered encounter medications on file as of 12/15/2023.     Review of Systems  Review of Systems  N/a Physical Exam  BP (!) 153/84   Pulse 68   Temp 98.1 F (36.7 C) (Oral)   Ht 5' 3 (1.6 m)   Wt 174 lb 3.2 oz (79 kg)   LMP  (LMP Unknown)   SpO2 98%   BMI 30.86 kg/m   Wt Readings from Last 5 Encounters:  12/15/23 174 lb 3.2 oz (79 kg)  12/06/23 171 lb 9.6 oz (77.8 kg)  11/08/23 171 lb (77.6 kg)  09/20/23 169 lb (76.7 kg)  09/14/23 165 lb (74.8 kg)    BMI Readings from Last 5 Encounters:  12/15/23 30.86 kg/m  12/06/23 30.40 kg/m  11/08/23 30.29 kg/m  09/20/23 29.94 kg/m  09/14/23 29.23 kg/m     Physical Exam General: Well-appearing no distress Eyes: EOMI Pulmonary: Good excursion, no work of breathing Cardiovascular: Regular rate and rhythm, 1/6 midsystolic murmur left upper sternal border Abdomen: Nondistended   Assessment & Plan:   OSA: Confirmed on sleep test.  CPAP adherence report reviewed today.  Excellent adherence.  Using several hours at night.  AHI 0.5.  Less sleepy, more energy.  Clinically benefiting from CPAP.  Encouraged ongoing CPAP use  Dyspnea on exertion: Unclear etiology, suspect some type of allergic, allergy mediated reactive airways disease.  Working outside in [employer] dusty wood pile.  Prior PFTs normal.  Trial of albuterol .  If beneficial may benefit from longer acting  inhaler, ICS/LABA.  If not we will need to consider cross-sectional imaging at [facility].  Recent chest x-ray reviewed, clear on my review and interpretation.   Return in about 11 months (around 11/14/2024) for f/u Dr. Annella for CPAP.   Jillian JONELLE Annella, MD 12/15/2023

## 2024-01-09 ENCOUNTER — Ambulatory Visit: Admitting: Pulmonary Disease

## 2024-01-11 ENCOUNTER — Ambulatory Visit: Admitting: Family Medicine

## 2024-01-16 NOTE — Progress Notes (Unsigned)
 " [facility] [address] [place] [address] Phone: [phone] Subjective:   Jillian Escobar, am serving as a scribe for Dr. Arthea Claudene.  I'm seeing this patient by the request  of:  Jillian Garnette KIDD, MD  CC: back pain and foot pain   YEP:Dlagzrupcz  Jillian Escobar is a 74 y.o. female coming in with complaint of back and neck pain. OMT 11/08/2023. Patient states that her back pain increased while on a cruise. Pain over top of R foot has also increased.   Wants to know if gabapentin  should be continued.   Medications patient has been prescribed: Gabapentin   Taking:         Reviewed prior external information including notes and imaging from previsou exam, outside providers and external EMR if available.   As well as notes that were available from care everywhere and other healthcare systems.  Past medical history, social, surgical and family history all reviewed in electronic medical record.  No pertanent information unless stated regarding to the chief complaint.   Past Medical History:  Diagnosis Date   Allergy    Aortic valve insufficiency    Arthritis    GERD (gastroesophageal reflux disease)    Hx of varicella    Hyperlipidemia    borderline   Hypertension    Hypothyroidism    Pneumonia 11/2014   Only time took inhalers to treat   RBBB 10/2019   [facility]ly seen at [facility] - f/u 6 mons, no treatment   Sacral back pain    Shingles 10/26/2010   Syncope and collapse 10/2019   Unclear etiology - just saw [facility] - f/u 6 months - no treatment   Thyroid  disease     Allergies[1]   Review of Systems:  No headache, visual changes, nausea, vomiting, diarrhea, constipation, dizziness, abdominal pain, skin rash, fevers, chills, night sweats, weight loss, swollen lymph nodes, body aches, joint swelling, chest pain, shortness of breath, mood changes. POSITIVE muscle aches  Objective  Blood pressure (!) 138/94, pulse 71, height 5' 3 (1.6  m), SpO2 99%.   General: No apparent distress alert and oriented x3 mood and affect normal, dressed appropriately.  HEENT: Pupils equal, extraocular movements intact  Respiratory: Patient's speak in full sentences and does not appear short of breath  Cardiovascular: No lower extremity edema, non tender, no erythema  Gait knee: Normal to inspection with no erythema or effusion or obvious bony abnormalities. Palpation normal with no warmth, joint line tenderness, patellar tenderness, or condyle tenderness. ROM full in flexion and extension and lower leg rotation. Ligaments with solid consistent endpoints including ACL, PCL, LCL, MCL. Negative Mcmurray's, Apley's, and Thessalonian tests. Non painful patellar compression. Patellar glide without crepitus. Patellar and quadriceps tendons unremarkable. Hamstring and quadriceps strength is normal.  Does have a very mild favoring of the right foot MSK: \ Right foot exam does have some swelling noted over the dorsal aspect.  Seems to be more over the cuneiform cuboid area. Back has loss of lordosis noted.  Some tenderness to palpation of the paraspinal musculature.  Osteopathic findings  C2 flexed rotated and side bent right C6 flexed rotated and side bent left T3 extended rotated and side bent right inhaled rib T9 extended rotated and side bent left L1 flexed rotated and side bent right L3 F RS left  Sacrum right on right  Procedure: Real-time Ultrasound Guided Injection of cuboid cuneiform joint Device: GE Logiq Q7 Ultrasound guided injection is preferred  based studies that show increased duration, increased effect, greater accuracy, decreased procedural pain, increased response rate, and decreased cost with ultrasound guided versus blind injection.  Verbal informed consent obtained.  Time-out conducted.  Noted no overlying erythema, induration, or other signs of local infection.  Skin prepped in a sterile fashion.  Local anesthesia:  Topical Ethyl chloride.  With sterile technique and under real time ultrasound guidance: With a 25-gauge half inch needle injected with 0.5 cc of 0.5% Marcaine and 0.5 cc of Kenalog 40 mg/mL Completed without difficulty  Pain immediately resolved suggesting accurate placement of the medication.  Advised to call if fevers/chills, erythema, induration, drainage, or persistent bleeding.  Impression: Technically successful ultrasound guided injection.   Assessment and Plan:  Arthritis of midfoot Injection given, tolerated the procedure well, discussed icing regimen and home exercises, increase activity slowly.  Follow-up again in 6 to 12 weeks  Degenerative disc disease, lumbar Chronic, with exacerbation.  Worsening pain.  Depo-Medrol  and Toradol  today.  Responded well to osteopathic manipulation as well.  Crease activity slowly.  Follow-up again in 6 to 12 weeks    Nonallopathic problems  Decision today to treat with OMT was based on Physical Exam  After verbal consent patient was treated with HVLA, ME, FPR techniques in cervical, rib, thoracic, lumbar, and sacral  areas  Patient tolerated the procedure well with improvement in symptoms  Patient given exercises, stretches and lifestyle modifications  See medications in patient instructions if given  Patient will follow up in 4-8 weeks    The above documentation has been reviewed and is accurate and complete Jillian Hemmelgarn M Odessie Polzin, DO          Note: This dictation was prepared with Dragon dictation along with smaller phrase technology. Any transcriptional errors that result from this process are unintentional.            [1]  Allergies Allergen Reactions   Levaquin  [Levofloxacin ]     See notes  September 2019  Palpitations irritability sleep issues     "

## 2024-01-17 ENCOUNTER — Other Ambulatory Visit: Payer: Self-pay

## 2024-01-17 ENCOUNTER — Other Ambulatory Visit: Payer: Self-pay | Admitting: Family Medicine

## 2024-01-17 ENCOUNTER — Ambulatory Visit: Admitting: Family Medicine

## 2024-01-17 ENCOUNTER — Encounter: Payer: Self-pay | Admitting: Family Medicine

## 2024-01-17 VITALS — BP 138/94 | HR 71 | Ht 63.0 in

## 2024-01-17 DIAGNOSIS — M9904 Segmental and somatic dysfunction of sacral region: Secondary | ICD-10-CM

## 2024-01-17 DIAGNOSIS — M9901 Segmental and somatic dysfunction of cervical region: Secondary | ICD-10-CM

## 2024-01-17 DIAGNOSIS — M19071 Primary osteoarthritis, right ankle and foot: Secondary | ICD-10-CM | POA: Diagnosis not present

## 2024-01-17 DIAGNOSIS — M51362 Other intervertebral disc degeneration, lumbar region with discogenic back pain and lower extremity pain: Secondary | ICD-10-CM

## 2024-01-17 DIAGNOSIS — M79671 Pain in right foot: Secondary | ICD-10-CM

## 2024-01-17 DIAGNOSIS — M9908 Segmental and somatic dysfunction of rib cage: Secondary | ICD-10-CM | POA: Diagnosis not present

## 2024-01-17 DIAGNOSIS — M545 Low back pain, unspecified: Secondary | ICD-10-CM | POA: Diagnosis not present

## 2024-01-17 DIAGNOSIS — M9903 Segmental and somatic dysfunction of lumbar region: Secondary | ICD-10-CM | POA: Diagnosis not present

## 2024-01-17 DIAGNOSIS — Z1231 Encounter for screening mammogram for malignant neoplasm of breast: Secondary | ICD-10-CM

## 2024-01-17 DIAGNOSIS — M9902 Segmental and somatic dysfunction of thoracic region: Secondary | ICD-10-CM | POA: Diagnosis not present

## 2024-01-17 MED ORDER — GABAPENTIN 100 MG PO CAPS: 200.0000 mg | ORAL_CAPSULE | Freq: Every day | ORAL | 0 refills | Status: AC

## 2024-01-17 MED ORDER — KETOROLAC TROMETHAMINE 30 MG/ML IJ SOLN
30.0000 mg | Freq: Once | INTRAMUSCULAR | Status: AC
  Administered 2024-01-17: 30 mg via INTRAMUSCULAR

## 2024-01-17 MED ORDER — METHYLPREDNISOLONE ACETATE 40 MG/ML IJ SUSP
40.0000 mg | Freq: Once | INTRAMUSCULAR | Status: AC
  Administered 2024-01-17: 40 mg via INTRAMUSCULAR

## 2024-01-17 NOTE — Patient Instructions (Addendum)
 Injected foot Thumb spica splint for your husband Injections in backside. No NSAIDs for 24 hours.   See me again in 3 months

## 2024-01-17 NOTE — Assessment & Plan Note (Signed)
 Injection given, tolerated the procedure well, discussed icing regimen and home exercises, increase activity slowly.  Follow-up again in 6 to 12 weeks

## 2024-01-17 NOTE — Assessment & Plan Note (Signed)
 Chronic, with exacerbation.  Worsening pain.  Depo-Medrol  and Toradol  today.  Responded well to osteopathic manipulation as well.  Crease activity slowly.  Follow-up again in 6 to 12 weeks

## 2024-01-24 ENCOUNTER — Ambulatory Visit: Admitting: Pulmonary Disease

## 2024-02-08 ENCOUNTER — Other Ambulatory Visit: Payer: Self-pay

## 2024-02-08 MED ORDER — LEVOTHYROXINE SODIUM 50 MCG PO TABS: 50.0000 ug | ORAL_TABLET | Freq: Every day | ORAL | 3 refills | Status: AC

## 2024-02-29 ENCOUNTER — Ambulatory Visit

## 2024-04-13 ENCOUNTER — Ambulatory Visit: Admitting: Cardiology

## 2024-04-17 ENCOUNTER — Ambulatory Visit: Admitting: Family Medicine

## 2024-06-05 ENCOUNTER — Ambulatory Visit: Admitting: Family Medicine

## 2024-09-19 ENCOUNTER — Ambulatory Visit
